# Patient Record
Sex: Female | Born: 1955 | Race: White | Hispanic: No | Marital: Married | Smoking: Former smoker
Health system: Southern US, Community
[De-identification: ages and names within clinical notes are randomized; demographics above are authoritative.]

## PROBLEM LIST (undated history)

## (undated) DIAGNOSIS — T7840XA Allergy, unspecified, initial encounter: Secondary | ICD-10-CM

## (undated) DIAGNOSIS — G4730 Sleep apnea, unspecified: Secondary | ICD-10-CM

## (undated) DIAGNOSIS — E785 Hyperlipidemia, unspecified: Secondary | ICD-10-CM

## (undated) DIAGNOSIS — G709 Myoneural disorder, unspecified: Secondary | ICD-10-CM

## (undated) DIAGNOSIS — R000 Tachycardia, unspecified: Secondary | ICD-10-CM

## (undated) DIAGNOSIS — M5134 Other intervertebral disc degeneration, thoracic region: Secondary | ICD-10-CM

## (undated) DIAGNOSIS — E119 Type 2 diabetes mellitus without complications: Secondary | ICD-10-CM

## (undated) DIAGNOSIS — F419 Anxiety disorder, unspecified: Secondary | ICD-10-CM

## (undated) DIAGNOSIS — F32A Depression, unspecified: Secondary | ICD-10-CM

## (undated) DIAGNOSIS — Z973 Presence of spectacles and contact lenses: Secondary | ICD-10-CM

## (undated) DIAGNOSIS — I10 Essential (primary) hypertension: Secondary | ICD-10-CM

## (undated) DIAGNOSIS — K219 Gastro-esophageal reflux disease without esophagitis: Secondary | ICD-10-CM

## (undated) DIAGNOSIS — J189 Pneumonia, unspecified organism: Secondary | ICD-10-CM

## (undated) DIAGNOSIS — J449 Chronic obstructive pulmonary disease, unspecified: Secondary | ICD-10-CM

## (undated) HISTORY — PX: ANKLE ARTHROSCOPY: SHX545

## (undated) HISTORY — PX: APPENDECTOMY: SHX54

## (undated) HISTORY — PX: CHOLECYSTECTOMY: SHX55

## (undated) HISTORY — DX: Depression, unspecified: F32.A

## (undated) HISTORY — DX: Allergy, unspecified, initial encounter: T78.40XA

## (undated) HISTORY — PX: BLADDER SUSPENSION: SHX72

## (undated) HISTORY — PX: ABDOMINAL HYSTERECTOMY: SHX81

## (undated) HISTORY — DX: Anxiety disorder, unspecified: F41.9

## (undated) HISTORY — PX: KNEE ARTHROSCOPY: SHX127

## (undated) HISTORY — PX: TONSILLECTOMY: SUR1361

## (undated) HISTORY — DX: Sleep apnea, unspecified: G47.30

## (undated) HISTORY — PX: BREAST SURGERY: SHX581

---

## 1986-05-11 HISTORY — PX: REDUCTION MAMMAPLASTY: SUR839

## 1987-05-12 HISTORY — PX: BREAST EXCISIONAL BIOPSY: SUR124

## 1988-05-11 HISTORY — PX: BREAST EXCISIONAL BIOPSY: SUR124

## 2002-01-28 ENCOUNTER — Emergency Department (HOSPITAL_COMMUNITY): Admission: AC | Admit: 2002-01-28 | Discharge: 2002-01-28 | Payer: Self-pay

## 2004-03-11 ENCOUNTER — Ambulatory Visit: Payer: Self-pay | Admitting: Unknown Physician Specialty

## 2004-03-12 ENCOUNTER — Emergency Department: Payer: Self-pay | Admitting: Emergency Medicine

## 2005-01-09 ENCOUNTER — Ambulatory Visit: Payer: Self-pay | Admitting: Unknown Physician Specialty

## 2005-04-27 ENCOUNTER — Ambulatory Visit: Payer: Self-pay | Admitting: Endocrinology

## 2005-07-06 ENCOUNTER — Ambulatory Visit: Payer: Self-pay | Admitting: Podiatry

## 2005-07-22 ENCOUNTER — Other Ambulatory Visit: Payer: Self-pay

## 2005-07-24 ENCOUNTER — Ambulatory Visit: Payer: Self-pay | Admitting: Podiatry

## 2005-12-02 ENCOUNTER — Emergency Department: Payer: Self-pay | Admitting: Emergency Medicine

## 2006-04-02 ENCOUNTER — Emergency Department: Payer: Self-pay | Admitting: Emergency Medicine

## 2007-04-15 ENCOUNTER — Ambulatory Visit: Payer: Self-pay | Admitting: Internal Medicine

## 2007-05-27 ENCOUNTER — Ambulatory Visit (HOSPITAL_BASED_OUTPATIENT_CLINIC_OR_DEPARTMENT_OTHER): Admission: RE | Admit: 2007-05-27 | Discharge: 2007-05-27 | Payer: Self-pay | Admitting: Urology

## 2007-10-27 ENCOUNTER — Ambulatory Visit: Payer: Self-pay | Admitting: Internal Medicine

## 2007-10-28 ENCOUNTER — Ambulatory Visit: Payer: Self-pay | Admitting: Internal Medicine

## 2008-05-23 ENCOUNTER — Ambulatory Visit: Payer: Self-pay | Admitting: Internal Medicine

## 2008-07-28 ENCOUNTER — Emergency Department: Payer: Self-pay | Admitting: Internal Medicine

## 2008-08-15 ENCOUNTER — Ambulatory Visit: Payer: Self-pay | Admitting: Unknown Physician Specialty

## 2008-09-20 ENCOUNTER — Ambulatory Visit: Payer: Self-pay | Admitting: Unknown Physician Specialty

## 2008-12-21 ENCOUNTER — Ambulatory Visit: Payer: Self-pay | Admitting: Internal Medicine

## 2009-03-07 ENCOUNTER — Ambulatory Visit: Payer: Self-pay | Admitting: Internal Medicine

## 2009-04-25 ENCOUNTER — Ambulatory Visit: Payer: Self-pay | Admitting: Internal Medicine

## 2009-07-03 ENCOUNTER — Emergency Department: Payer: Self-pay | Admitting: Emergency Medicine

## 2010-06-05 ENCOUNTER — Ambulatory Visit: Payer: Self-pay

## 2010-06-17 ENCOUNTER — Ambulatory Visit: Payer: Self-pay

## 2010-07-10 ENCOUNTER — Ambulatory Visit: Payer: Self-pay

## 2010-08-06 ENCOUNTER — Ambulatory Visit: Payer: Self-pay | Admitting: Internal Medicine

## 2010-09-15 ENCOUNTER — Emergency Department: Payer: Self-pay | Admitting: Emergency Medicine

## 2010-09-23 NOTE — Op Note (Signed)
NAME:  Wendy Hubbard, Wendy Hubbard                  ACCOUNT NO.:  [id number]   MEDICAL RECORD NO.:  [id number]          PATIENT TYPE:  AMB   LOCATION:  [facility]                         FACILITY:  [facility]   PHYSICIAN:  Mark C. Vernie Ammons, M.D.  DATE OF BIRTH:  1956/03/20   DATE OF PROCEDURE:  05/27/2007  DATE OF DISCHARGE:                               OPERATIVE REPORT   PREOPERATIVE DIAGNOSES:  1. Stress urinary incontinence.  2. Cystocele.   POSTOPERATIVE DIAGNOSES:  1. Stress urinary incontinence.  2. Cystocele.   PROCEDURE:  Anterior repair.   SURGEON:  Mark C. Vernie Ammons, MD   ANESTHESIA:  General.   BLOOD LOSS:  Approximately 50 mL.   DRAINS:  A 16-French Foley catheter.   SPECIMENS:  None.   COMPLICATIONS:  None.   INDICATIONS:  The patient is a 55 year old white female with pure stress  urinary incontinence which has been demonstrated by urodynamic study.  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  She  desires repair of the cystocele simultaneously with correction of her  stress incontinence.  The risks, complications, alternatives and  limitations have been discussed with the patient and she understands and  has elected to proceed.   DESCRIPTION OF OPERATION:  After informed consent, the patient brought  to the major OR, placed on the table, administered general anesthesia  then moved to the dorsal lithotomy position.  Her genitalia was  sterilely prepped with Betadine and draped and an official time-out was  then performed.  A 16-French Foley catheter was placed in the bladder  and a weighted speculum in the vagina.  Then 0.5% Marcaine with  epinephrine was then used to infiltrate the subvaginal mucosa in the  midline and after allowing adequate time for epinephrine effect a  midline incision was then made over the bulging cystocele and on out to  near the opening of the introitus.  Allis clamps were then placed on the  vaginal mucosa and a combination of sharp  and blunt dissection was used  to dissect the vaginal mucosa off of the bulging bladder on both right  and left sides to expose the pubocervical fascia which was easily  identified.   The defect was then repaired by reapproximating the pubocervical fascia  in the midline with interrupted figure-of-eight 0-0 braided permanent  suture.  This obliterated the cystocele defect nicely.  Redundant  vaginal mucosa was then excised.   Dr. Vonita Moss then proceeded to perform a transobturator sling procedure  which will be dictated by him separately and the closure was then  performed by Dr. Vonita Moss which will also be dictated separately.  The  patient tolerated the procedure well.  There were no intraoperative  complications.  She will be observed overnight and discharged after she  passes her voiding trial.      Loraine Leriche C. Vernie Ammons, M.D.  Electronically Signed     MCO/MEDQ  D:  05/27/2007  T:  05/27/2007  Job:  [id number]

## 2010-09-23 NOTE — Op Note (Signed)
NAME:  Hubbard, Wendy                  ACCOUNT NO.:  [id number]   MEDICAL RECORD NO.:  [id number]          PATIENT TYPE:  AMB   LOCATION:  [facility]                         FACILITY:  [facility]   PHYSICIAN:  Maretta Bees. Vonita Moss, M.D.DATE OF BIRTH:  [birth year]   DATE OF PROCEDURE:  05/27/2007  DATE OF DISCHARGE:                               OPERATIVE REPORT   PREOPERATIVE DIAGNOSIS:  Stress urinary incontinence and cystocele.   POSTOP DIAGNOSIS:  Stress urinary incontinence and cystocele.   PROCEDURE:  Obturator sling insertion and cystoscopy.   SURGEON:  Dr. Larey Dresser.   ASSISTANT:  Dr. Ihor Gully.   ANESTHESIA:  General.   INDICATIONS:  This 55 year old lady has had a problem with a cystocele  and stress incontinence.  Urodynamics showed essentially stable bladder.  She was counseled about the need for anterior repair per Dr. Vernie Ammons and  obturator sling insertion by me.  She was advised about the risks of  retention, persistent incontinence, erosion, infection or bladder  injury.   PROCEDURE:  The case was begun by Dr. Vernie Ammons who in a separate  dictation documented the completion of an anterior repair.  I then  dissected out the suburethral vaginal region and had good exposure to  the endopelvic fascia.  Stab wounds were placed over each obturator  fossa.  The curved needle passers were used to march behind the back of  the medial aspect of the obturator bone and exit in through the  endopelvic fascia, mid urethral area.  The sling was then inserted by  pulling up on both sides with our previously placed needle passers.  I  then cystoscoped and there was no evidence of bladder or urethral  injury.  The sling coverings were removed as the sling was placed mid  urethra with no tension created by placing a hemostat between the  urethra with the Foley catheter back in and the sling itself.  Sling and  the wounds were irrigated with double antibiotic solution.   At this point the  vaginal incision was closed by me with running 2-0  Vicryl.  There was good hemostasis.  Urine was clear.  Sponge, needle,  and instrument count was correct.  Estimated blood loss was 50 mL.  A  vaginal pack impregnated with estrogen cream was then placed.  The stab  wounds were closed with Dermabond.  She was taken to the recovery room  in good condition. She was taken to the recovery room in good condition  having tolerated the procedure well.      Maretta Bees. Vonita Moss, M.D.  Electronically Signed    LJP/MEDQ  D:  05/27/2007  T:  05/27/2007  Job:  [id number]

## 2010-09-25 ENCOUNTER — Ambulatory Visit: Payer: Self-pay

## 2010-10-03 ENCOUNTER — Ambulatory Visit: Payer: Self-pay

## 2010-10-13 ENCOUNTER — Ambulatory Visit: Payer: Self-pay | Admitting: Podiatry

## 2010-10-23 ENCOUNTER — Ambulatory Visit: Payer: Self-pay

## 2010-11-10 ENCOUNTER — Ambulatory Visit: Payer: Self-pay | Admitting: Internal Medicine

## 2011-01-29 LAB — I-STAT 8, (EC8 V) (CONVERTED LAB)
BUN: 17
Bicarbonate: 24.9 — ABNORMAL HIGH
Chloride: 109
Glucose, Bld: 126 — ABNORMAL HIGH
HCT: 47 — ABNORMAL HIGH
Hemoglobin: 16 — ABNORMAL HIGH
Operator id: 134391
Potassium: 3.8
Sodium: 140
TCO2: 26
pCO2, Ven: 41.1 — ABNORMAL LOW
pH, Ven: 7.391 — ABNORMAL HIGH

## 2011-01-30 ENCOUNTER — Ambulatory Visit: Payer: Self-pay

## 2011-04-08 ENCOUNTER — Ambulatory Visit: Payer: Self-pay

## 2011-06-11 ENCOUNTER — Ambulatory Visit: Payer: Self-pay | Admitting: Internal Medicine

## 2011-06-29 ENCOUNTER — Other Ambulatory Visit: Payer: Self-pay | Admitting: Unknown Physician Specialty

## 2011-06-29 LAB — CLOSTRIDIUM DIFFICILE BY PCR

## 2011-07-01 LAB — STOOL CULTURE

## 2011-07-05 ENCOUNTER — Emergency Department: Payer: Self-pay | Admitting: Internal Medicine

## 2011-07-05 LAB — CBC
HCT: 39.3 % (ref 35.0–47.0)
HGB: 13.3 g/dL (ref 12.0–16.0)
MCH: 32.7 pg (ref 26.0–34.0)
MCHC: 33.9 g/dL (ref 32.0–36.0)
MCV: 96 fL (ref 80–100)
Platelet: 349 10*3/uL (ref 150–440)
RBC: 4.08 10*6/uL (ref 3.80–5.20)
RDW: 13.6 % (ref 11.5–14.5)
WBC: 17.5 10*3/uL — ABNORMAL HIGH (ref 3.6–11.0)

## 2011-07-16 ENCOUNTER — Ambulatory Visit: Payer: Self-pay | Admitting: Internal Medicine

## 2011-08-14 ENCOUNTER — Ambulatory Visit: Payer: Self-pay

## 2011-11-05 ENCOUNTER — Ambulatory Visit: Payer: Self-pay | Admitting: Internal Medicine

## 2011-12-07 ENCOUNTER — Other Ambulatory Visit: Payer: Self-pay | Admitting: Unknown Physician Specialty

## 2011-12-07 LAB — CLOSTRIDIUM DIFFICILE BY PCR

## 2011-12-25 ENCOUNTER — Ambulatory Visit: Payer: Self-pay | Admitting: Unknown Physician Specialty

## 2011-12-29 ENCOUNTER — Other Ambulatory Visit: Payer: Self-pay | Admitting: Unknown Physician Specialty

## 2011-12-29 LAB — CLOSTRIDIUM DIFFICILE BY PCR

## 2011-12-30 LAB — WBCS, STOOL

## 2012-07-03 ENCOUNTER — Other Ambulatory Visit: Payer: Self-pay | Admitting: Unknown Physician Specialty

## 2012-07-04 ENCOUNTER — Ambulatory Visit: Payer: Self-pay | Admitting: Unknown Physician Specialty

## 2012-07-04 LAB — CLOSTRIDIUM DIFFICILE BY PCR

## 2012-07-06 LAB — STOOL CULTURE

## 2012-07-10 ENCOUNTER — Emergency Department: Payer: Self-pay | Admitting: Emergency Medicine

## 2012-11-02 ENCOUNTER — Ambulatory Visit: Payer: Self-pay | Admitting: Internal Medicine

## 2013-07-24 ENCOUNTER — Ambulatory Visit: Payer: Self-pay

## 2014-08-22 ENCOUNTER — Encounter (HOSPITAL_COMMUNITY): Payer: Self-pay | Admitting: Emergency Medicine

## 2014-08-22 ENCOUNTER — Emergency Department (HOSPITAL_COMMUNITY)
Admission: EM | Admit: 2014-08-22 | Discharge: 2014-08-22 | Disposition: A | Discharge status: Home / Self Care | Payer: Worker's Compensation | Attending: Emergency Medicine | Admitting: Emergency Medicine

## 2014-08-22 ENCOUNTER — Emergency Department (HOSPITAL_COMMUNITY): Payer: Worker's Compensation

## 2014-08-22 DIAGNOSIS — S8992XA Unspecified injury of left lower leg, initial encounter: Secondary | ICD-10-CM | POA: Insufficient documentation

## 2014-08-22 DIAGNOSIS — Z880 Allergy status to penicillin: Secondary | ICD-10-CM | POA: Diagnosis not present

## 2014-08-22 DIAGNOSIS — K219 Gastro-esophageal reflux disease without esophagitis: Secondary | ICD-10-CM | POA: Diagnosis not present

## 2014-08-22 DIAGNOSIS — E119 Type 2 diabetes mellitus without complications: Secondary | ICD-10-CM | POA: Diagnosis not present

## 2014-08-22 DIAGNOSIS — W19XXXA Unspecified fall, initial encounter: Secondary | ICD-10-CM

## 2014-08-22 DIAGNOSIS — W010XXA Fall on same level from slipping, tripping and stumbling without subsequent striking against object, initial encounter: Secondary | ICD-10-CM | POA: Insufficient documentation

## 2014-08-22 DIAGNOSIS — J449 Chronic obstructive pulmonary disease, unspecified: Secondary | ICD-10-CM | POA: Diagnosis not present

## 2014-08-22 DIAGNOSIS — I10 Essential (primary) hypertension: Secondary | ICD-10-CM | POA: Insufficient documentation

## 2014-08-22 DIAGNOSIS — Z79899 Other long term (current) drug therapy: Secondary | ICD-10-CM | POA: Diagnosis not present

## 2014-08-22 DIAGNOSIS — Y998 Other external cause status: Secondary | ICD-10-CM | POA: Diagnosis not present

## 2014-08-22 DIAGNOSIS — Y9289 Other specified places as the place of occurrence of the external cause: Secondary | ICD-10-CM | POA: Insufficient documentation

## 2014-08-22 DIAGNOSIS — Y9389 Activity, other specified: Secondary | ICD-10-CM | POA: Insufficient documentation

## 2014-08-22 DIAGNOSIS — S79912A Unspecified injury of left hip, initial encounter: Secondary | ICD-10-CM | POA: Insufficient documentation

## 2014-08-22 DIAGNOSIS — M25552 Pain in left hip: Secondary | ICD-10-CM

## 2014-08-22 HISTORY — DX: Chronic obstructive pulmonary disease, unspecified: J44.9

## 2014-08-22 HISTORY — DX: Essential (primary) hypertension: I10

## 2014-08-22 HISTORY — DX: Gastro-esophageal reflux disease without esophagitis: K21.9

## 2014-08-22 HISTORY — DX: Hyperlipidemia, unspecified: E78.5

## 2014-08-22 HISTORY — DX: Tachycardia, unspecified: R00.0

## 2014-08-22 HISTORY — DX: Type 2 diabetes mellitus without complications: E11.9

## 2014-08-22 MED ORDER — ONDANSETRON 4 MG PO TBDP
4.0000 mg | ORAL_TABLET | Freq: Once | ORAL | Status: AC
  Administered 2014-08-22: 4 mg via ORAL
  Filled 2014-08-22: qty 1

## 2014-08-22 MED ORDER — CYCLOBENZAPRINE HCL 10 MG PO TABS: 10.0000 mg | ORAL_TABLET | Freq: Two times a day (BID) | ORAL | Status: DC | PRN

## 2014-08-22 MED ORDER — NAPROXEN 375 MG PO TABS: 375.0000 mg | ORAL_TABLET | Freq: Two times a day (BID) | ORAL | Status: DC

## 2014-08-22 MED ORDER — OXYCODONE-ACETAMINOPHEN 5-325 MG PO TABS: 1.0000 | ORAL_TABLET | Freq: Four times a day (QID) | ORAL | Status: DC | PRN

## 2014-08-22 MED ORDER — OXYCODONE-ACETAMINOPHEN 5-325 MG PO TABS
1.0000 | ORAL_TABLET | Freq: Once | ORAL | Status: AC
  Administered 2014-08-22: 1 via ORAL
  Filled 2014-08-22: qty 1

## 2014-08-22 NOTE — Discharge Instructions (Signed)
Arthralgia °Your caregiver has diagnosed you as suffering from an arthralgia. Arthralgia means there is pain in a joint. This can come from many reasons including: °· Bruising the joint which causes soreness (inflammation) in the joint. °· Wear and tear on the joints which occur as we grow older (osteoarthritis). °· Overusing the joint. °· Various forms of arthritis. °· Infections of the joint. °Regardless of the cause of pain in your joint, most of these different pains respond to anti-inflammatory drugs and rest. The exception to this is when a joint is infected, and these cases are treated with antibiotics, if it is a bacterial infection. °HOME CARE INSTRUCTIONS  °· Rest the injured area for as [place] as directed by your caregiver. Then slowly start using the joint as directed by your caregiver and as the pain allows. Crutches as directed may be useful if the ankles, knees or hips are involved. If the knee was splinted or casted, continue use and care as directed. If an stretchy or elastic wrapping bandage has been applied today, it should be removed and re-applied every 3 to 4 hours. It should not be applied tightly, but firmly enough to keep swelling down. Watch toes and feet for swelling, bluish discoloration, coldness, numbness or excessive pain. If any of these problems (symptoms) occur, remove the ace bandage and re-apply more loosely. If these symptoms persist, contact your caregiver or return to this location. °· For the first 24 hours, keep the injured extremity elevated on pillows while lying down. °· Apply ice for 15-20 minutes to the sore joint every couple hours while awake for the first half day. Then 03-04 times per day for the first 48 hours. Put the ice in a plastic bag and place a towel between the bag of ice and your skin. °· Wear any splinting, casting, elastic bandage applications, or slings as instructed. °· Only take over-the-counter or prescription medicines for pain, discomfort, or fever as  directed by your caregiver. Do not use aspirin immediately after the injury unless instructed by your physician. Aspirin can cause increased bleeding and bruising of the tissues. °· If you were given crutches, continue to use them as instructed and do not resume weight bearing on the sore joint until instructed. °Persistent pain and inability to use the sore joint as directed for more than 2 to 3 days are warning signs indicating that you should see a caregiver for a follow-up visit as soon as possible. Initially, a hairline fracture (break in bone) may not be evident on X-rays. Persistent pain and swelling indicate that further evaluation, non-weight bearing or use of the joint (use of crutches or slings as instructed), or further X-rays are indicated. X-rays may sometimes not show a small fracture until a week or 10 days later. Make a follow-up appointment with your own caregiver or one to whom we have referred you. A radiologist (specialist in reading X-rays) may read your X-rays. Make sure you know how you are to obtain your X-ray results. Do not assume everything is normal if you do not hear from us. °SEEK MEDICAL CARE IF: °Bruising, swelling, or pain increases. °SEEK IMMEDIATE MEDICAL CARE IF:  °· Your fingers or toes are numb or blue. °· The pain is not responding to medications and continues to stay the same or get worse. °· The pain in your joint becomes severe. °· You develop a fever over 102° F (38.9° C). °· It becomes impossible to move or use the joint. °MAKE SURE YOU:  °·   Understand these instructions.  Will watch your condition.  Will get help right away if you are not doing well or get worse. Document Released: 04/27/2005 Document Revised: 07/20/2011 Document Reviewed: 12/14/2007 [facility] Patient Information 2015 [place], [place]. This information is not intended to replace advice given to you by your health care provider. Make sure you discuss any questions you have with your health care  provider.  Hip Pain Your hip is the joint between your upper legs and your lower pelvis. The bones, cartilage, tendons, and muscles of your hip joint perform a lot of work each day supporting your body weight and allowing you to move around. Hip pain can range from a minor ache to severe pain in one or both of your hips. Pain may be felt on the inside of the hip joint near the groin, or the outside near the buttocks and upper thigh. You may have swelling or stiffness as well.  HOME CARE INSTRUCTIONS   Take medicines only as directed by your health care provider.  Apply ice to the injured area:  Put ice in a plastic bag.  Place a towel between your skin and the bag.  Leave the ice on for 15-20 minutes at a time, 3-4 times a day.  Keep your leg raised (elevated) when possible to lessen swelling.  Avoid activities that cause pain.  Follow specific exercises as directed by your health care provider.  Sleep with a pillow between your legs on your most comfortable side.  Record how often you have hip pain, the location of the pain, and what it feels like. SEEK MEDICAL CARE IF:   You are unable to put weight on your leg.  Your hip is red or swollen or very tender to touch.  Your pain or swelling continues or worsens after 1 week.  You have increasing difficulty walking.  You have a fever. SEEK IMMEDIATE MEDICAL CARE IF:   You have fallen.  You have a sudden increase in pain and swelling in your hip. MAKE SURE YOU:   Understand these instructions.  Will watch your condition.  Will get help right away if you are not doing well or get worse. Document Released: 10/15/2009 Document Revised: 09/11/2013 Document Reviewed: 12/22/2012 [facility] Patient Information 2015 [place], [place]. This information is not intended to replace advice given to you by your health care provider. Make sure you discuss any questions you have with your health care provider.  Knee Effusion The medical  term for having fluid in your knee is effusion. This is often due to an internal derangement of the knee. This means something is wrong inside the knee. Some of the causes of fluid in the knee may be torn cartilage, a torn ligament, or bleeding into the joint from an injury. Your knee is likely more difficult to bend and move. This is often because there is increased pain and pressure in the joint. The time it takes for recovery from a knee effusion depends on different factors, including:   Type of injury.  Your age.  Physical and medical conditions.  Rehabilitation Strategies. How [place] you will be away from your normal activities will depend on what kind of knee problem you have and how much damage is present. Your knee has two types of cartilage. Articular cartilage covers the bone ends and lets your knee bend and move smoothly. Two menisci, thick pads of cartilage that form a rim inside the joint, help absorb shock and stabilize your knee. Ligaments bind the  bones together and support your knee joint. Muscles move the joint, help support your knee, and take stress off the joint itself. CAUSES  Often an effusion in the knee is caused by an injury to one of the menisci. This is often a tear in the cartilage. Recovery after a meniscus injury depends on how much meniscus is damaged and whether you have damaged other knee tissue. Small tears may heal on their own with conservative treatment. Conservative means rest, limited weight bearing activity and muscle strengthening exercises. Your recovery may take up to 6 weeks.  TREATMENT  Larger tears may require surgery. Meniscus injuries may be treated during arthroscopy. Arthroscopy is a procedure in which your surgeon uses a small telescope like instrument to look in your knee. Your caregiver can make a more accurate diagnosis (learning what is wrong) by performing an arthroscopic procedure. If your injury is on the inner margin of the meniscus, your  surgeon may trim the meniscus back to a smooth rim. In other cases your surgeon will try to repair a damaged meniscus with stitches (sutures). This may make rehabilitation take longer, but may provide better [place] term result by helping your knee keep its shock absorption capabilities. Ligaments which are completely torn usually require surgery for repair. HOME CARE INSTRUCTIONS  Use crutches as instructed.  If a brace is applied, use as directed.  Once you are home, an ice pack applied to your swollen knee may help with discomfort and help decrease swelling.  Keep your knee raised (elevated) when you are not up and around or on crutches.  Only take over-the-counter or prescription medicines for pain, discomfort, or fever as directed by your caregiver.  Your caregivers will help with instructions for rehabilitation of your knee. This often includes strengthening exercises.  You may resume a normal diet and activities as directed. SEEK MEDICAL CARE IF:   There is increased swelling in your knee.  You notice redness, swelling, or increasing pain in your knee.  An unexplained oral temperature above 102 F (38.9 C) develops. SEEK IMMEDIATE MEDICAL CARE IF:   You develop a rash.  You have difficulty breathing.  You have any allergic reactions from medications you may have been given.  There is severe pain with any motion of the knee. MAKE SURE YOU:   Understand these instructions.  Will watch your condition.  Will get help right away if you are not doing well or get worse. Document Released: 07/18/2003 Document Revised: 07/20/2011 Document Reviewed: 09/21/2007 [facility] Patient Information 2015 [place], [place]. This information is not intended to replace advice given to you by your health care provider. Make sure you discuss any questions you have with your health care provider.

## 2014-08-22 NOTE — ED Notes (Signed)
Bed: WTR9 Expected date:  Expected time:  Means of arrival:  Comments: 59 y/o tripped over phone cord

## 2014-08-22 NOTE — ED Provider Notes (Signed)
CSN: [id number]     Arrival date & time 08/22/14  1219 History  This chart was scribed for a non-physician practitioner, Delos Haring, PA-C working with Jola Schmidt, MD by Martinique Peace, ED Scribe. The patient was seen in WTR9/WTR9. The patient's care was started at 12:36 PM.     Chief Complaint  Patient presents with  . Leg Pain  . Knee Injury      Patient is a 59 y.o. female presenting with leg pain. The history is provided by the patient. No language interpreter was used.  Leg Pain    PCP: No primary care provider on file.  Wendy Hubbard is a 59 y.o.female with a significant PMH of diabetes, COPD, hypertension, hyperlipidemia, GERD, rapid heart rate.  HPI Comments: Wendy Hubbard is a 59 y.o. female who presents to the Emergency Department complaining of fall that occurred earlier today while pt was at work and tripped over a phone cord. Pt landed on a concrete floor on her left side. She now complains of left hip pain, left leg pain, and bilateral knee pain (left worse than right). No complaints of headache, impacts to the head, LOC, syncope, or rib pain. History of DM and hypertension.    Past Medical History  Diagnosis Date  . Diabetes mellitus without complication   . COPD (chronic obstructive pulmonary disease)   . Hypertension   . Hyperlipidemia   . GERD (gastroesophageal reflux disease)   . Rapid heart rate    Past Surgical History  Procedure Laterality Date  . Tonsillectomy    . Abdominal hysterectomy    . Breast surgery    . Cholecystectomy    . Appendectomy    . Ankle arthroscopy Bilateral   . Knee arthroscopy Left   . Bladder suspension     History reviewed. No pertinent family history. History  Substance Use Topics  . Smoking status: Not on file  . Smokeless tobacco: Not on file  . Alcohol Use: Not on file   OB History    No data available     Review of Systems  Musculoskeletal: Positive for arthralgias.  Neurological: Negative for syncope and  headaches.  All other systems reviewed and are negative.   Allergies  Flagyl; Penicillins; and Doxycycline  Home Medications   Prior to Admission medications   Medication Sig Start Date End Date Taking? Authorizing Provider  allopurinol (ZYLOPRIM) 300 MG tablet Take 1 tablet by mouth daily. 07/20/14  Yes Historical Provider, MD  ALPRAZolam Duanne Moron) 0.25 MG tablet Take 0.25 mg by mouth 2 (two) times daily as needed for anxiety.   Yes Historical Provider, MD  furosemide (LASIX) 40 MG tablet Take 1 tablet by mouth daily. 07/26/14  Yes Historical Provider, MD  glimepiride (AMARYL) 1 MG tablet Take 1 tablet by mouth daily. 07/20/14  Yes Historical Provider, MD  HYDROcodone-acetaminophen (NORCO/VICODIN) 5-325 MG per tablet Take 1 tablet by mouth at bedtime as needed. pain 05/30/14  Yes Historical Provider, MD  lansoprazole (PREVACID) 30 MG capsule Take 1 capsule by mouth 2 (two) times daily. 07/20/14  Yes Historical Provider, MD  lisinopril (PRINIVIL,ZESTRIL) 10 MG tablet take 1 tablet by mouth once daily 10/10/13  Yes Historical Provider, MD  LYRICA 75 MG capsule Take 1 capsule by mouth 2 (two) times daily. 07/26/14  Yes Historical Provider, MD  sitaGLIPtin-metformin (JANUMET) 50-500 MG per tablet Take 1 tablet by mouth 2 (two) times daily with a meal.   Yes Historical Provider, MD  spironolactone (ALDACTONE) 50  MG tablet Take 50 mg by mouth daily. 07/20/14  Yes Historical Provider, MD  cyclobenzaprine (FLEXERIL) 10 MG tablet Take 1 tablet (10 mg total) by mouth 2 (two) times daily as needed for muscle spasms. 08/22/14   Wilton Thrall Carlota Raspberry, PA-C  naproxen (NAPROSYN) 375 MG tablet Take 1 tablet (375 mg total) by mouth 2 (two) times daily. 08/22/14   Delos Haring, PA-C  oxyCODONE-acetaminophen (PERCOCET/ROXICET) 5-325 MG per tablet Take 1-2 tablets by mouth every 6 (six) hours as needed for severe pain. 08/22/14   Judeth Gilles Carlota Raspberry, PA-C   BP 110/46 mmHg  Pulse 79  Resp 18  SpO2 96% Physical Exam   Constitutional: She is oriented to person, place, and time. She appears well-developed and well-nourished. No distress.  HENT:  Head: Normocephalic. Head is without Battle's sign, without abrasion, without contusion, without right periorbital erythema and without left periorbital erythema.  Eyes: Conjunctivae and EOM are normal.  Neck: Neck supple. No spinous process tenderness and no muscular tenderness present. No tracheal deviation present.  Cardiovascular: Normal rate.   Pulmonary/Chest: Effort normal. No respiratory distress. She exhibits no tenderness, no bony tenderness and no laceration.  Abdominal: Bowel sounds are normal. She exhibits no distension and no fluid wave. There is no tenderness.  Musculoskeletal:       Left hip: She exhibits tenderness and bony tenderness. She exhibits normal range of motion (pt able to perform FROM with some discomfort), normal strength, no swelling and no deformity.       Left knee: She exhibits swelling. She exhibits normal range of motion and no deformity. Tenderness found. Medial joint line and lateral joint line tenderness noted. No patellar tendon tenderness noted.  Neurological: She is alert and oriented to person, place, and time.  Skin: Skin is warm and dry.  Psychiatric: She has a normal mood and affect. Her behavior is normal.  Nursing note and vitals reviewed.   ED Course  Procedures (including critical care time) Labs Review Labs Reviewed - No data to display  Imaging Review Dg Knee Complete 4 Views Left  08/22/2014   CLINICAL DATA:  [place] over telephone wire. Landed on knee. Knee injury and pain. Initial encounter.  EXAM: LEFT KNEE - COMPLETE 4+ VIEW  COMPARISON:  None.  FINDINGS: There is no evidence of fracture, dislocation, or joint effusion. Mild tricompartmental degenerative spurring is seen. There is mild medial joint space narrowing. No other significant bone abnormality identified. Soft tissues are unremarkable.  IMPRESSION: No  acute findings.  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   Electronically Signed   By: Earle Gell M.D.   On: 08/22/2014 15:19   Dg Hip Unilat With Pelvis 2-3 Views Left  08/22/2014   CLINICAL DATA:  Tripped over telephone wire. Left hip injury and pain. Initial encounter.  EXAM: LEFT HIP (WITH PELVIS) 2-3 VIEWS  COMPARISON:  None.  FINDINGS: There is no evidence of hip fracture or dislocation. There is no evidence of arthropathy or other focal bone abnormality.  IMPRESSION: Negative.   Electronically Signed   By: Earle Gell M.D.   On: 08/22/2014 14:52     EKG Interpretation None     Medications  oxyCODONE-acetaminophen (PERCOCET/ROXICET) 5-325 MG per tablet 1 tablet (1 tablet Oral Given 08/22/14 1256)  ondansetron (ZOFRAN-ODT) disintegrating tablet 4 mg (4 mg Oral Given 08/22/14 1256)  oxyCODONE-acetaminophen (PERCOCET/ROXICET) 5-325 MG per tablet 1 tablet (1 tablet Oral Given 08/22/14 1532)   12:39 PM- Treatment plan was discussed with patient who verbalizes understanding and  agrees.   MDM   Final diagnoses:  Fall  Knee injury, left, initial encounter  Left hip pain    Xrays are unremarkable. Pt will most likely need MRI of her knee to r/o tendon/ligament injury but as an outpatient, will place in knee immobilizer, she declines crutches at home and reports having walker at home. Hip is no longer hurting. RICE recommended. She requests Dr. Ronnie Derby specifically for f/u.  59 y.o.Wendy Hubbard's evaluation in the Emergency Department is complete. It has been determined that no acute conditions requiring further emergency intervention are present at this time. The patient/guardian have been advised of the diagnosis and plan. We have discussed signs and symptoms that warrant return to the ED, such as changes or worsening in symptoms.  Vital signs are stable at discharge. Filed Vitals:   08/22/14 1501  BP: 110/46  Pulse: 79  Resp: 18    Patient/guardian has voiced  understanding and agreed to follow-up with the PCP or specialist.   I personally performed the services described in this documentation, which was scribed in my presence. The recorded information has been reviewed and is accurate.   Delos Haring, PA-C 08/22/14 [place], MD 08/22/14 [phone]

## 2014-08-22 NOTE — ED Notes (Signed)
Pt tripped over a cord, landed on a concrete floor. Fell to knees first, complaint of mid thigh pain to left leg and knee pain bilaterally.

## 2014-08-22 NOTE — ED Notes (Signed)
AVS explained in detail. Knows to follow up with MD Lucey (orthopedics) ASAP. No other c/c. All medications explained in detail. Knows not to drink alcohol/drive/operate heavy machinery with prescribed medications.

## 2014-08-22 NOTE — ED Notes (Signed)
Ortho called 

## 2014-09-06 DIAGNOSIS — G8929 Other chronic pain: Secondary | ICD-10-CM | POA: Insufficient documentation

## 2014-09-06 DIAGNOSIS — M25562 Pain in left knee: Secondary | ICD-10-CM | POA: Insufficient documentation

## 2014-10-04 DIAGNOSIS — M1712 Unilateral primary osteoarthritis, left knee: Secondary | ICD-10-CM | POA: Insufficient documentation

## 2015-07-18 ENCOUNTER — Ambulatory Visit
Admission: RE | Admit: 2015-07-18 | Discharge: 2015-07-18 | Disposition: A | Discharge status: Home / Self Care | Payer: BLUE CROSS/BLUE SHIELD | Source: Ambulatory Visit | Attending: Physician Assistant | Admitting: Physician Assistant

## 2015-07-18 ENCOUNTER — Other Ambulatory Visit: Payer: Self-pay | Admitting: Physician Assistant

## 2015-07-18 DIAGNOSIS — R05 Cough: Secondary | ICD-10-CM

## 2015-07-18 DIAGNOSIS — R059 Cough, unspecified: Secondary | ICD-10-CM

## 2015-08-19 ENCOUNTER — Other Ambulatory Visit: Payer: Self-pay | Admitting: Nurse Practitioner

## 2015-08-19 ENCOUNTER — Ambulatory Visit
Admission: RE | Admit: 2015-08-19 | Discharge: 2015-08-19 | Disposition: A | Discharge status: Home / Self Care | Payer: BLUE CROSS/BLUE SHIELD | Source: Ambulatory Visit | Attending: Nurse Practitioner | Admitting: Nurse Practitioner

## 2015-08-19 DIAGNOSIS — M50323 Other cervical disc degeneration at C6-C7 level: Secondary | ICD-10-CM | POA: Diagnosis not present

## 2015-08-19 DIAGNOSIS — R52 Pain, unspecified: Secondary | ICD-10-CM | POA: Insufficient documentation

## 2015-08-19 DIAGNOSIS — M50322 Other cervical disc degeneration at C5-C6 level: Secondary | ICD-10-CM | POA: Diagnosis not present

## 2015-08-19 DIAGNOSIS — M2560 Stiffness of unspecified joint, not elsewhere classified: Secondary | ICD-10-CM

## 2015-08-19 DIAGNOSIS — M129 Arthropathy, unspecified: Secondary | ICD-10-CM | POA: Insufficient documentation

## 2015-08-19 DIAGNOSIS — R6889 Other general symptoms and signs: Secondary | ICD-10-CM | POA: Insufficient documentation

## 2015-10-21 ENCOUNTER — Other Ambulatory Visit
Admission: RE | Admit: 2015-10-21 | Discharge: 2015-10-21 | Disposition: A | Discharge status: Home / Self Care | Payer: BLUE CROSS/BLUE SHIELD | Source: Ambulatory Visit | Attending: Nurse Practitioner | Admitting: Nurse Practitioner

## 2015-10-21 ENCOUNTER — Other Ambulatory Visit: Payer: Self-pay | Admitting: Nurse Practitioner

## 2015-10-21 DIAGNOSIS — K625 Hemorrhage of anus and rectum: Secondary | ICD-10-CM

## 2015-10-21 DIAGNOSIS — R195 Other fecal abnormalities: Secondary | ICD-10-CM

## 2015-10-21 DIAGNOSIS — K529 Noninfective gastroenteritis and colitis, unspecified: Secondary | ICD-10-CM | POA: Diagnosis present

## 2015-10-21 DIAGNOSIS — R1032 Left lower quadrant pain: Secondary | ICD-10-CM

## 2015-10-21 LAB — GASTROINTESTINAL PANEL BY PCR, STOOL (REPLACES STOOL CULTURE)

## 2015-10-21 LAB — C DIFFICILE QUICK SCREEN W PCR REFLEX
C Diff antigen: NEGATIVE
C Diff interpretation: NEGATIVE
C Diff toxin: NEGATIVE

## 2015-10-22 DIAGNOSIS — E1142 Type 2 diabetes mellitus with diabetic polyneuropathy: Secondary | ICD-10-CM | POA: Insufficient documentation

## 2015-10-22 DIAGNOSIS — G4733 Obstructive sleep apnea (adult) (pediatric): Secondary | ICD-10-CM | POA: Insufficient documentation

## 2015-10-22 DIAGNOSIS — E119 Type 2 diabetes mellitus without complications: Secondary | ICD-10-CM | POA: Insufficient documentation

## 2015-10-22 DIAGNOSIS — K219 Gastro-esophageal reflux disease without esophagitis: Secondary | ICD-10-CM | POA: Insufficient documentation

## 2015-10-23 ENCOUNTER — Ambulatory Visit
Admission: RE | Admit: 2015-10-23 | Discharge: 2015-10-23 | Disposition: A | Discharge status: Home / Self Care | Payer: BLUE CROSS/BLUE SHIELD | Source: Ambulatory Visit | Attending: Nurse Practitioner | Admitting: Nurse Practitioner

## 2015-10-23 DIAGNOSIS — R195 Other fecal abnormalities: Secondary | ICD-10-CM | POA: Diagnosis not present

## 2015-10-23 DIAGNOSIS — R1032 Left lower quadrant pain: Secondary | ICD-10-CM | POA: Diagnosis not present

## 2015-10-23 DIAGNOSIS — K625 Hemorrhage of anus and rectum: Secondary | ICD-10-CM | POA: Diagnosis not present

## 2015-10-23 DIAGNOSIS — K529 Noninfective gastroenteritis and colitis, unspecified: Secondary | ICD-10-CM | POA: Insufficient documentation

## 2015-10-23 DIAGNOSIS — K760 Fatty (change of) liver, not elsewhere classified: Secondary | ICD-10-CM | POA: Insufficient documentation

## 2015-10-23 MED ORDER — IOPAMIDOL (ISOVUE-300) INJECTION 61%
100.0000 mL | Freq: Once | INTRAVENOUS | Status: AC | PRN
  Administered 2015-10-23: 100 mL via INTRAVENOUS

## 2015-11-01 ENCOUNTER — Emergency Department: Payer: BLUE CROSS/BLUE SHIELD

## 2015-11-01 ENCOUNTER — Observation Stay
Admission: EM | Admit: 2015-11-01 | Discharge: 2015-11-04 | Disposition: A | Discharge status: Home / Self Care | Payer: BLUE CROSS/BLUE SHIELD | Attending: Internal Medicine | Admitting: Internal Medicine

## 2015-11-01 ENCOUNTER — Encounter: Payer: Self-pay | Admitting: *Deleted

## 2015-11-01 ENCOUNTER — Observation Stay: Payer: BLUE CROSS/BLUE SHIELD

## 2015-11-01 DIAGNOSIS — E669 Obesity, unspecified: Secondary | ICD-10-CM | POA: Insufficient documentation

## 2015-11-01 DIAGNOSIS — Z883 Allergy status to other anti-infective agents status: Secondary | ICD-10-CM | POA: Diagnosis not present

## 2015-11-01 DIAGNOSIS — K640 First degree hemorrhoids: Secondary | ICD-10-CM | POA: Insufficient documentation

## 2015-11-01 DIAGNOSIS — J449 Chronic obstructive pulmonary disease, unspecified: Secondary | ICD-10-CM | POA: Insufficient documentation

## 2015-11-01 DIAGNOSIS — I700 Atherosclerosis of aorta: Secondary | ICD-10-CM | POA: Insufficient documentation

## 2015-11-01 DIAGNOSIS — Z9049 Acquired absence of other specified parts of digestive tract: Secondary | ICD-10-CM | POA: Insufficient documentation

## 2015-11-01 DIAGNOSIS — R1011 Right upper quadrant pain: Secondary | ICD-10-CM | POA: Diagnosis present

## 2015-11-01 DIAGNOSIS — R1032 Left lower quadrant pain: Secondary | ICD-10-CM

## 2015-11-01 DIAGNOSIS — M109 Gout, unspecified: Secondary | ICD-10-CM | POA: Diagnosis not present

## 2015-11-01 DIAGNOSIS — I110 Hypertensive heart disease with heart failure: Secondary | ICD-10-CM | POA: Insufficient documentation

## 2015-11-01 DIAGNOSIS — K760 Fatty (change of) liver, not elsewhere classified: Secondary | ICD-10-CM | POA: Insufficient documentation

## 2015-11-01 DIAGNOSIS — Z7984 Long term (current) use of oral hypoglycemic drugs: Secondary | ICD-10-CM | POA: Insufficient documentation

## 2015-11-01 DIAGNOSIS — Z87891 Personal history of nicotine dependence: Secondary | ICD-10-CM | POA: Insufficient documentation

## 2015-11-01 DIAGNOSIS — Z90710 Acquired absence of both cervix and uterus: Secondary | ICD-10-CM | POA: Insufficient documentation

## 2015-11-01 DIAGNOSIS — G8929 Other chronic pain: Secondary | ICD-10-CM | POA: Diagnosis not present

## 2015-11-01 DIAGNOSIS — D72829 Elevated white blood cell count, unspecified: Secondary | ICD-10-CM | POA: Insufficient documentation

## 2015-11-01 DIAGNOSIS — I509 Heart failure, unspecified: Secondary | ICD-10-CM | POA: Diagnosis not present

## 2015-11-01 DIAGNOSIS — Z880 Allergy status to penicillin: Secondary | ICD-10-CM | POA: Diagnosis not present

## 2015-11-01 DIAGNOSIS — F419 Anxiety disorder, unspecified: Secondary | ICD-10-CM | POA: Insufficient documentation

## 2015-11-01 DIAGNOSIS — K319 Disease of stomach and duodenum, unspecified: Secondary | ICD-10-CM | POA: Insufficient documentation

## 2015-11-01 DIAGNOSIS — K2970 Gastritis, unspecified, without bleeding: Secondary | ICD-10-CM | POA: Diagnosis not present

## 2015-11-01 DIAGNOSIS — E1140 Type 2 diabetes mellitus with diabetic neuropathy, unspecified: Secondary | ICD-10-CM | POA: Insufficient documentation

## 2015-11-01 DIAGNOSIS — K625 Hemorrhage of anus and rectum: Secondary | ICD-10-CM | POA: Insufficient documentation

## 2015-11-01 DIAGNOSIS — K219 Gastro-esophageal reflux disease without esophagitis: Secondary | ICD-10-CM | POA: Insufficient documentation

## 2015-11-01 DIAGNOSIS — Z87892 Personal history of anaphylaxis: Secondary | ICD-10-CM | POA: Insufficient documentation

## 2015-11-01 DIAGNOSIS — E785 Hyperlipidemia, unspecified: Secondary | ICD-10-CM | POA: Diagnosis not present

## 2015-11-01 DIAGNOSIS — Z9889 Other specified postprocedural states: Secondary | ICD-10-CM | POA: Diagnosis not present

## 2015-11-01 DIAGNOSIS — Z6841 Body Mass Index (BMI) 40.0 and over, adult: Secondary | ICD-10-CM | POA: Insufficient documentation

## 2015-11-01 DIAGNOSIS — N281 Cyst of kidney, acquired: Secondary | ICD-10-CM | POA: Diagnosis not present

## 2015-11-01 DIAGNOSIS — R109 Unspecified abdominal pain: Secondary | ICD-10-CM | POA: Diagnosis present

## 2015-11-01 LAB — URINALYSIS COMPLETE WITH MICROSCOPIC (ARMC ONLY)
Bilirubin Urine: NEGATIVE
Glucose, UA: NEGATIVE mg/dL
Ketones, ur: NEGATIVE mg/dL
Leukocytes, UA: NEGATIVE
Nitrite: NEGATIVE
Protein, ur: NEGATIVE mg/dL
Specific Gravity, Urine: 1.006 (ref 1.005–1.030)
Squamous Epithelial / LPF: NONE SEEN
pH: 5 (ref 5.0–8.0)

## 2015-11-01 LAB — CBC
HCT: 37.2 % (ref 35.0–47.0)
HCT: 35 % (ref 35.0–47.0)
Hemoglobin: 12.3 g/dL (ref 12.0–16.0)
Hemoglobin: 11.7 g/dL — ABNORMAL LOW (ref 12.0–16.0)
MCH: 32.4 pg (ref 26.0–34.0)
MCH: 32.5 pg (ref 26.0–34.0)
MCHC: 33 g/dL (ref 32.0–36.0)
MCHC: 33.3 g/dL (ref 32.0–36.0)
MCV: 98.3 fL (ref 80.0–100.0)
MCV: 97.5 fL (ref 80.0–100.0)
Platelets: 336 10*3/uL (ref 150–440)
Platelets: 318 10*3/uL (ref 150–440)
RBC: 3.78 MIL/uL — ABNORMAL LOW (ref 3.80–5.20)
RBC: 3.59 MIL/uL — ABNORMAL LOW (ref 3.80–5.20)
RDW: 14 % (ref 11.5–14.5)
RDW: 14 % (ref 11.5–14.5)
WBC: 18.8 10*3/uL — ABNORMAL HIGH (ref 3.6–11.0)
WBC: 14.4 10*3/uL — ABNORMAL HIGH (ref 3.6–11.0)

## 2015-11-01 LAB — LACTIC ACID, PLASMA
Lactic Acid, Venous: 1.7 mmol/L (ref 0.5–2.0)
Lactic Acid, Venous: 2.7 mmol/L (ref 0.5–2.0)

## 2015-11-01 LAB — GLUCOSE, CAPILLARY
Glucose-Capillary: 98 mg/dL (ref 65–99)
Glucose-Capillary: 106 mg/dL — ABNORMAL HIGH (ref 65–99)

## 2015-11-01 LAB — TROPONIN I: Troponin I: <0.03 ng/mL (ref <0.031)

## 2015-11-01 LAB — COMPREHENSIVE METABOLIC PANEL
ALT: 26 U/L (ref 14–54)
AST: 25 U/L (ref 15–41)
Albumin: 4.1 g/dL (ref 3.5–5.0)
Alkaline Phosphatase: 85 U/L (ref 38–126)
Anion gap: 10 (ref 5–15)
BUN: 22 mg/dL — ABNORMAL HIGH (ref 6–20)
CO2: 23 mmol/L (ref 22–32)
Calcium: 9.3 mg/dL (ref 8.9–10.3)
Chloride: 104 mmol/L (ref 101–111)
Creatinine, Ser: 1.02 mg/dL — ABNORMAL HIGH (ref 0.44–1.00)
GFR calc Af Amer: >60 mL/min (ref >60)
GFR calc non Af Amer: 59 mL/min — ABNORMAL LOW (ref >60)
Glucose, Bld: 108 mg/dL — ABNORMAL HIGH (ref 65–99)
Potassium: 3.4 mmol/L — ABNORMAL LOW (ref 3.5–5.1)
Sodium: 137 mmol/L (ref 135–145)
Total Bilirubin: 0.3 mg/dL (ref 0.3–1.2)
Total Protein: 6.9 g/dL (ref 6.5–8.1)

## 2015-11-01 LAB — LIPASE, BLOOD: Lipase: 44 U/L (ref 11–51)

## 2015-11-01 MED ORDER — ENOXAPARIN SODIUM 40 MG/0.4ML ~~LOC~~ SOLN: 40.0000 mg | SUBCUTANEOUS | Status: DC

## 2015-11-01 MED ORDER — CYCLOBENZAPRINE HCL 10 MG PO TABS
10.0000 mg | ORAL_TABLET | Freq: Two times a day (BID) | ORAL | Status: DC | PRN
  Administered 2015-11-01: 10 mg via ORAL
  Filled 2015-11-01: qty 1

## 2015-11-01 MED ORDER — ACETAMINOPHEN 325 MG PO TABS
650.0000 mg | ORAL_TABLET | Freq: Four times a day (QID) | ORAL | Status: DC | PRN
  Administered 2015-11-03: 650 mg via ORAL
  Filled 2015-11-01: qty 2

## 2015-11-01 MED ORDER — HYDROMORPHONE HCL 1 MG/ML IJ SOLN
1.0000 mg | Freq: Once | INTRAMUSCULAR | Status: AC
  Administered 2015-11-01: 1 mg via INTRAVENOUS
  Filled 2015-11-01: qty 1

## 2015-11-01 MED ORDER — PREGABALIN 75 MG PO CAPS
75.0000 mg | ORAL_CAPSULE | Freq: Two times a day (BID) | ORAL | Status: DC
  Administered 2015-11-01: 75 mg via ORAL
  Filled 2015-11-01: qty 1

## 2015-11-01 MED ORDER — ONDANSETRON HCL 4 MG PO TABS: 4.0000 mg | ORAL_TABLET | Freq: Four times a day (QID) | ORAL | Status: DC | PRN

## 2015-11-01 MED ORDER — POLYETHYLENE GLYCOL 3350 17 G PO PACK: 17.0000 g | PACK | Freq: Every day | ORAL | Status: DC | PRN

## 2015-11-01 MED ORDER — ACETAMINOPHEN 650 MG RE SUPP: 650.0000 mg | Freq: Four times a day (QID) | RECTAL | Status: DC | PRN

## 2015-11-01 MED ORDER — DIATRIZOATE MEGLUMINE & SODIUM 66-10 % PO SOLN
15.0000 mL | Freq: Once | ORAL | Status: AC
  Administered 2015-11-01: 15 mL via ORAL

## 2015-11-01 MED ORDER — FUROSEMIDE 40 MG PO TABS
40.0000 mg | ORAL_TABLET | Freq: Every day | ORAL | Status: DC
  Administered 2015-11-02 – 2015-11-04 (×3): 40 mg via ORAL
  Filled 2015-11-01 (×4): qty 1

## 2015-11-01 MED ORDER — MORPHINE SULFATE (PF) 4 MG/ML IV SOLN
4.0000 mg | Freq: Once | INTRAVENOUS | Status: AC
Start: 2015-11-01 — End: 2015-11-01
  Administered 2015-11-01: 4 mg via INTRAVENOUS
  Filled 2015-11-01: qty 1

## 2015-11-01 MED ORDER — ONDANSETRON HCL 4 MG/2ML IJ SOLN
4.0000 mg | Freq: Four times a day (QID) | INTRAMUSCULAR | Status: DC | PRN
  Administered 2015-11-03: 4 mg via INTRAVENOUS
  Filled 2015-11-01: qty 2

## 2015-11-01 MED ORDER — INSULIN ASPART 100 UNIT/ML ~~LOC~~ SOLN
0.0000 [IU] | Freq: Three times a day (TID) | SUBCUTANEOUS | Status: DC
  Filled 2015-11-01: qty 2

## 2015-11-01 MED ORDER — ALLOPURINOL 300 MG PO TABS
300.0000 mg | ORAL_TABLET | Freq: Every day | ORAL | Status: DC
  Administered 2015-11-01 – 2015-11-03 (×3): 300 mg via ORAL
  Filled 2015-11-01 (×5): qty 1

## 2015-11-01 MED ORDER — SPIRONOLACTONE 25 MG PO TABS
50.0000 mg | ORAL_TABLET | Freq: Every day | ORAL | Status: DC
  Administered 2015-11-02 – 2015-11-04 (×3): 50 mg via ORAL
  Filled 2015-11-01 (×4): qty 2

## 2015-11-01 MED ORDER — LISINOPRIL 10 MG PO TABS
10.0000 mg | ORAL_TABLET | Freq: Every day | ORAL | Status: DC
  Administered 2015-11-01 – 2015-11-03 (×3): 10 mg via ORAL
  Filled 2015-11-01 (×4): qty 1

## 2015-11-01 MED ORDER — PANTOPRAZOLE SODIUM 40 MG PO TBEC
40.0000 mg | DELAYED_RELEASE_TABLET | Freq: Every day | ORAL | Status: DC
  Administered 2015-11-01 – 2015-11-04 (×4): 40 mg via ORAL
  Filled 2015-11-01 (×4): qty 1

## 2015-11-01 MED ORDER — IOPAMIDOL (ISOVUE-300) INJECTION 61%
100.0000 mL | Freq: Once | INTRAVENOUS | Status: AC | PRN
  Administered 2015-11-01: 100 mL via INTRAVENOUS

## 2015-11-01 MED ORDER — ONDANSETRON HCL 4 MG/2ML IJ SOLN
4.0000 mg | Freq: Once | INTRAMUSCULAR | Status: AC
  Administered 2015-11-01: 4 mg via INTRAVENOUS
  Filled 2015-11-01: qty 2

## 2015-11-01 MED ORDER — MORPHINE SULFATE (PF) 2 MG/ML IV SOLN
2.0000 mg | INTRAVENOUS | Status: DC | PRN
Start: 2015-11-01 — End: 2015-11-04
  Administered 2015-11-01 – 2015-11-02 (×6): 2 mg via INTRAVENOUS
  Filled 2015-11-01 (×6): qty 1

## 2015-11-01 MED ORDER — ALPRAZOLAM 0.25 MG PO TABS: 0.2500 mg | ORAL_TABLET | Freq: Two times a day (BID) | ORAL | Status: DC | PRN

## 2015-11-01 MED ORDER — OXYCODONE HCL 5 MG PO TABS
5.0000 mg | ORAL_TABLET | ORAL | Status: DC | PRN
  Administered 2015-11-01 – 2015-11-03 (×7): 5 mg via ORAL
  Filled 2015-11-01 (×7): qty 1

## 2015-11-01 MED ORDER — DOCUSATE SODIUM 100 MG PO CAPS
100.0000 mg | ORAL_CAPSULE | Freq: Two times a day (BID) | ORAL | Status: DC
  Administered 2015-11-01: 100 mg via ORAL
  Filled 2015-11-01 (×2): qty 1

## 2015-11-01 MED ORDER — SODIUM CHLORIDE 0.9 % IV SOLN
INTRAVENOUS | Status: DC
  Administered 2015-11-01 – 2015-11-04 (×4): via INTRAVENOUS

## 2015-11-01 MED ORDER — SODIUM CHLORIDE 0.9 % IV BOLUS (SEPSIS)
1000.0000 mL | Freq: Once | INTRAVENOUS | Status: AC
  Administered 2015-11-01: 1000 mL via INTRAVENOUS

## 2015-11-01 MED ORDER — INSULIN ASPART 100 UNIT/ML ~~LOC~~ SOLN: 0.0000 [IU] | Freq: Every day | SUBCUTANEOUS | Status: DC

## 2015-11-01 NOTE — Consult Note (Signed)
GI Inpatient Consult Note  Reason for Consult:abd pain, diarrhea, elevated WBC    Attending Requesting Consult:Hower History of Present Illness: Wendy Hubbard is a 60 y.o. femalewith RUQ abd pain, stabbing constant pain, no change with eating, nausea but no vomiting, present off and on for 6 months, no change in pain with eating or changing position.  Also pain in LLQ no change with BM, no change with change in position,  She has diarrhea 6-12 stools a day, maybe 3 nights a week has 1-2 nocturnal bowel movements.  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  Past Medical History:  Past Medical History  Diagnosis Date  . Diabetes mellitus without complication ([place])   . COPD (chronic obstructive pulmonary disease) ([place])   . Hypertension   . Hyperlipidemia   . GERD (gastroesophageal reflux disease)   . Rapid heart rate     Problem List: Patient Active Problem List   Diagnosis Date Noted  . Intractable abdominal pain 11/01/2015    Past Surgical History: Past Surgical History  Procedure Laterality Date  . Tonsillectomy    . Abdominal hysterectomy    . Breast surgery    . Cholecystectomy    . Appendectomy    . Ankle arthroscopy Bilateral   . Knee arthroscopy Left   . Bladder suspension      Allergies: Allergies  Allergen Reactions  . Flagyl [Metronidazole] Anaphylaxis and Rash  . Penicillins Anaphylaxis and Other (See Comments)    Has patient had a PCN reaction causing immediate rash, facial/tongue/throat swelling, SOB or lightheadedness with hypotension: Yes Has patient had a PCN reaction causing severe rash involving mucus membranes or skin necrosis: No Has patient had a PCN reaction that required hospitalization No Has patient had a PCN reaction occurring within the last 10 years: No If all of the above answers are "NO", then may proceed with Cephalosporin use.  Marland Kitchen Doxycycline Hives    Home Medications: Prescriptions prior to admission  Medication Sig Dispense  Refill Last Dose  . allopurinol (ZYLOPRIM) 300 MG tablet Take 300 mg by mouth at bedtime.   0 10/31/2015 at Unknown time  . ALPRAZolam (XANAX) 0.25 MG tablet Take 0.25 mg by mouth 2 (two) times daily as needed for anxiety.   Past Week at Unknown time  . carisoprodol (SOMA) 350 MG tablet Take 350 mg by mouth 2 (two) times daily as needed for muscle spasms.   Past Week at Unknown time  . escitalopram (LEXAPRO) 10 MG tablet Take 10 mg by mouth at bedtime.   10/31/2015 at Unknown time  . furosemide (LASIX) 40 MG tablet Take 40 mg by mouth daily.   0 11/01/2015 at Unknown time  . glimepiride (AMARYL) 1 MG tablet Take 1 mg by mouth daily with breakfast.   0 11/01/2015 at Unknown time  . HYDROcodone-acetaminophen (NORCO/VICODIN) 5-325 MG per tablet Take 1 tablet by mouth at bedtime as needed for moderate pain.   0 10/31/2015 at 1930  . lansoprazole (PREVACID) 30 MG capsule Take 30 mg by mouth 2 (two) times daily.   0 11/01/2015 at Unknown time  . lisinopril (PRINIVIL,ZESTRIL) 10 MG tablet Take 10 mg by mouth at bedtime.    10/31/2015 at Unknown time  . metoprolol succinate (TOPROL-XL) 50 MG 24 hr tablet Take 50 mg by mouth at bedtime. Take with or immediately following a meal.   10/31/2015 at 2200  . montelukast (SINGULAIR) 10 MG tablet Take 10 mg by mouth daily.  11/01/2015 at Unknown time  . sitaGLIPtin-metformin (JANUMET) 50-1000 MG tablet Take 1 tablet by mouth 2 (two) times daily with a meal.   11/01/2015 at Unknown time  . spironolactone (ALDACTONE) 50 MG tablet Take 50 mg by mouth daily.  0 11/01/2015 at Unknown time   Home medication reconciliation was completed with the patient.   Scheduled Inpatient Medications:   . allopurinol  300 mg Oral Daily  . docusate sodium  100 mg Oral BID  . furosemide  40 mg Oral Daily  . insulin aspart  0-5 Units Subcutaneous QHS  . insulin aspart  0-9 Units Subcutaneous TID WC  . lisinopril  10 mg Oral Daily  . pantoprazole  40 mg Oral Daily  . pregabalin  75 mg Oral  BID  . spironolactone  50 mg Oral Daily    Continuous Inpatient Infusions:   . sodium chloride 75 mL/hr at 11/01/15 1656    PRN Inpatient Medications:  acetaminophen **OR** acetaminophen, ALPRAZolam, cyclobenzaprine, morphine injection, ondansetron **OR** ondansetron (ZOFRAN) IV, oxyCODONE, polyethylene glycol  Family History: family history includes Hypertension in her other.  The patient's family history is negative for inflammatory bowel disorders, GI malignancy, or solid organ transplantation. Both parents alive in their late 6's  Social History:   reports that she has quit smoking. She does not have any smokeless tobacco history on file. The patient denies ETOH, tobacco, or drug use.   Review of Systems: Constitutional: Weight is stable. NO chills, no fever, no dysuria, no hematuria, no skin rash, has headache today, has acid reflux, sometimes feels like food stuck in esophagus. Eyes: No changes in vision. ENT: No oral lesions, sore throat.  GI: see HPI.  Heme/Lymph: No easy bruising.  CV: No chest pain.  GU: No hematuria.  Integumentary: No rashes.  Neuro: No headaches.  Psych: No depression/anxiety.  Endocrine: No heat/cold intolerance.  Allergic/Immunologic: No urticaria.  Resp: No cough, SOB.  Musculoskeletal: No joint swelling.    Physical Examination: BP 130/59 mmHg  Pulse 96  Temp(Src) 98.2 F (36.8 C) (Oral)  Resp 18  Ht 5\' 4"  (1.626 m)  Wt 110.904 kg (244 lb 8 oz)  BMI 41.95 kg/m2  SpO2 96% Gen: NAD, alert and oriented x 4 HEENT: PEERLA, EOMI, Neck: supple, no JVD or thyromegaly Chest: CTA bilaterally, no wheezes, crackles, or other adventitious sounds CV: RRR, no m/g/c/r Abd: obese, no masses, no rebound tenderness, bowel sounds present Ext: no edema, well perfused with 2+ pulses, Skin: no rash or lesions noted Lymph: no LAD  Data: Lab Results  Component Value Date   WBC 18.8* 11/01/2015   HGB 12.3 11/01/2015   HCT 37.2 11/01/2015   MCV 98.3  11/01/2015   PLT 336 11/01/2015    Recent Labs Lab 11/01/15 1749  HGB 12.3   Lab Results  Component Value Date   NA 137 11/01/2015   K 3.4* 11/01/2015   CL 104 11/01/2015   CO2 23 11/01/2015   BUN 22* 11/01/2015   CREATININE 1.02* 11/01/2015   Lab Results  Component Value Date   ALT 26 11/01/2015   AST 25 11/01/2015   ALKPHOS 85 11/01/2015   BILITOT 0.3 11/01/2015   No results for input(s): APTT, INR, PTT in the last 168 hours. Assessment/Plan: Wendy Hubbard is a 60 y.o. female with pain in RUQ and LLQ, diarrhea, previous C. Diff, last stool test was neg 2 weeks ago.  Elevated WBC for uncertain reason.  Also climbing lactic acid level. Recommendations: Check sed  rate and CRP in morning, do EGD tomorrow for the abd pain.   Thank you for the consult. Please call with questions or concerns.  Gaylyn Cheers, MD

## 2015-11-01 NOTE — ED Notes (Addendum)
Pt states she has been seeing Dr. Vira Agar and c/o lower and upper abdominal pain. Pt states she has had CT scans and stools samples done.  Pt had blood work done this week and had an increase in WBC count.  Pt is scheduled for colonoscopy on 7/12, but may be done sooner. Pt states she also is nauseated and has had severe diarrhea along with blood in her stool.

## 2015-11-01 NOTE — ED Notes (Signed)
Admitting MD at bedside.

## 2015-11-01 NOTE — ED Notes (Signed)
Pt calling out, appears that she is upset from waiting; inquiring when doctor is coming. MD not at desk but pt chart is out of "to be seen" area; informed patient and will followup with MD.

## 2015-11-01 NOTE — ED Notes (Signed)
Patient transported to X-ray 

## 2015-11-01 NOTE — ED Notes (Signed)
MD at bedside. 

## 2015-11-01 NOTE — ED Notes (Signed)
Pt states that she had bloodwork drawn this AM at [facility] and that we could access labwork from them.  Pt being followed by Dr. Tiffany Kocher

## 2015-11-01 NOTE — H&P (Addendum)
[name]    MR#:  [id number]  DATE OF BIRTH:  1955/10/05   DATE OF ADMISSION:  11/01/2015  PRIMARY CARE PHYSICIAN: Lavera Guise, MD   REQUESTING/REFERRING PHYSICIAN: Lord  CHIEF COMPLAINT:   Chief Complaint  Patient presents with  . Abdominal Pain  . Nausea    HISTORY OF PRESENT ILLNESS:  Wendy Hubbard  is a 60 y.o. female with a known history of Type 2 diabetes non-insulin-requiring, essential hypertension who is presenting with abdominal pain. She states that she follows with Dr. Vira Agar of gastroenterology has been having chronic diarrhea for numerous months with no found etiology this has remained unchanged she is also been complaining of left lower quadrant abdominal pain for approximately 6 months in total duration she now has one day duration of right upper quadrant abdominal pain described all pains as constant "pain" 7/10 in intensity worse with movement no relieving factors. She states there is no difference in symptoms on eating or drinking. She states she did have 2 bowel movements that appeared to be maroon in color Denies further symptomatology  PAST MEDICAL HISTORY:   Past Medical History  Diagnosis Date  . Diabetes mellitus without complication   . COPD (chronic obstructive pulmonary disease)   . Hypertension   . Hyperlipidemia   . GERD (gastroesophageal reflux disease)   . Rapid heart rate     PAST SURGICAL HISTORY:   Past Surgical History  Procedure Laterality Date  . Tonsillectomy    . Abdominal hysterectomy    . Breast surgery    . Cholecystectomy    . Appendectomy    . Ankle arthroscopy Bilateral   . Knee arthroscopy Left   . Bladder suspension      SOCIAL HISTORY:   Social History  Substance Use Topics  . Smoking status: Former Research scientist (life sciences)  . Smokeless tobacco: Not on file  . Alcohol Use: Not on file    FAMILY HISTORY:   Family History  Problem Relation Age of Onset    . Hypertension Other     DRUG ALLERGIES:   Allergies  Allergen Reactions  . Flagyl [Metronidazole] Anaphylaxis and Rash  . Penicillins Anaphylaxis and Other (See Comments)    Has patient had a PCN reaction causing immediate rash, facial/tongue/throat swelling, SOB or lightheadedness with hypotension: Yes Has patient had a PCN reaction causing severe rash involving mucus membranes or skin necrosis: No Has patient had a PCN reaction that required hospitalization No Has patient had a PCN reaction occurring within the last 10 years: No If all of the above answers are "NO", then may proceed with Cephalosporin use.  Marland Kitchen Doxycycline Hives    REVIEW OF SYSTEMS:  REVIEW OF SYSTEMS:  CONSTITUTIONAL: Denies fevers, chills, fatigue, weakness.  EYES: Denies blurred vision, double vision, or eye pain.  EARS, NOSE, THROAT: Denies tinnitus, ear pain, hearing loss.  RESPIRATORY: denies cough, shortness of breath, wheezing  CARDIOVASCULAR: Denies chest pain, palpitations, edema.  GASTROINTESTINAL: Positive nausea, vomiting, diarrhea, abdominal pain.  GENITOURINARY: Denies dysuria, hematuria.  ENDOCRINE: Denies nocturia or thyroid problems. HEMATOLOGIC AND LYMPHATIC: Denies easy bruising or bleeding.  SKIN: Denies rash or lesions.  MUSCULOSKELETAL: Denies pain in neck, back, shoulder, knees, hips, or further arthritic symptoms.  NEUROLOGIC: Denies paralysis, paresthesias.  PSYCHIATRIC: Denies anxiety or depressive symptoms. Otherwise full review of systems performed by me is negative.   MEDICATIONS AT HOME:   Prior to Admission medications  Medication Sig Start Date End Date Taking? Authorizing Provider  allopurinol (ZYLOPRIM) 300 MG tablet Take 300 mg by mouth at bedtime.    Yes Historical Provider, MD  ALPRAZolam (XANAX) 0.25 MG tablet Take 0.25 mg by mouth 2 (two) times daily as needed for anxiety.   Yes Historical Provider, MD  carisoprodol (SOMA) 350 MG tablet Take 350 mg by mouth 2 (two)  times daily as needed for muscle spasms.   Yes Historical Provider, MD  escitalopram (LEXAPRO) 10 MG tablet Take 10 mg by mouth at bedtime.   Yes Historical Provider, MD  furosemide (LASIX) 40 MG tablet Take 40 mg by mouth daily.    Yes Historical Provider, MD  glimepiride (AMARYL) 1 MG tablet Take 1 mg by mouth daily with breakfast.    Yes Historical Provider, MD  HYDROcodone-acetaminophen (NORCO/VICODIN) 5-325 MG per tablet Take 1 tablet by mouth at bedtime as needed for moderate pain.    Yes Historical Provider, MD  lansoprazole (PREVACID) 30 MG capsule Take 30 mg by mouth 2 (two) times daily.    Yes Historical Provider, MD  lisinopril (PRINIVIL,ZESTRIL) 10 MG tablet Take 10 mg by mouth at bedtime.    Yes Historical Provider, MD  metoprolol succinate (TOPROL-XL) 50 MG 24 hr tablet Take 50 mg by mouth at bedtime. Take with or immediately following a meal.   Yes Historical Provider, MD  montelukast (SINGULAIR) 10 MG tablet Take 10 mg by mouth daily.   Yes Historical Provider, MD  sitaGLIPtin-metformin (JANUMET) 50-1000 MG tablet Take 1 tablet by mouth 2 (two) times daily with a meal.   Yes Historical Provider, MD  spironolactone (ALDACTONE) 50 MG tablet Take 50 mg by mouth daily.   Yes Historical Provider, MD      VITAL SIGNS:  Blood pressure 131/51, pulse 105, temperature 98.2 F (36.8 C), temperature source Oral, resp. rate 20, height 5\' 4"  (1.626 m), weight 243 lb (110.224 kg), SpO2 95 %.  PHYSICAL EXAMINATION:  VITAL SIGNS: Filed Vitals:   11/01/15 1622 11/01/15 1623  BP: 131/51   Pulse:  105  Temp:    Resp:     GENERAL:60 y.o.female currently in no acute distress. Obese HEAD: Normocephalic, atraumatic.  EYES: Pupils equal, round, reactive to light. Extraocular muscles intact. No scleral icterus.  MOUTH: Moist mucosal membrane. Dentition intact. No abscess noted.  EAR, NOSE, THROAT: Clear without exudates. No external lesions.  NECK: Supple. No thyromegaly. No nodules. No JVD.    PULMONARY: Clear to ascultation, without wheeze rails or rhonci. No use of accessory muscles, Good respiratory effort. good air entry bilaterally CHEST: Nontender to palpation.  CARDIOVASCULAR: S1 and S2. Regular rate and rhythm. No murmurs, rubs, or gallops. No edema. Pedal pulses 2+ bilaterally.  GASTROINTESTINAL: Soft, Minimal tenderness right upper quadrant and left lower quadrant without rebound or guarding, nondistended. No masses. Positive bowel sounds. No hepatosplenomegaly.  MUSCULOSKELETAL: No swelling, clubbing, or edema. Range of motion full in all extremities.  NEUROLOGIC: Cranial nerves II through XII are intact. No gross focal neurological deficits. Sensation intact. Reflexes intact.  SKIN: No ulceration, lesions, rashes, or cyanosis. Skin warm and dry. Turgor intact.  PSYCHIATRIC: Mood, affect within normal limits. The patient is awake, alert and oriented x 3. Insight, judgment intact.    LABORATORY PANEL:   CBC No results for input(s): WBC, HGB, HCT, PLT in the last 168 hours. ------------------------------------------------------------------------------------------------------------------  Chemistries   Recent Labs Lab 11/01/15 1433  NA 137  K 3.4*  CL 104  CO2 23  GLUCOSE 108*  BUN 22*  CREATININE 1.02*  CALCIUM 9.3  AST 25  ALT 26  ALKPHOS 85  BILITOT 0.3   ------------------------------------------------------------------------------------------------------------------  Cardiac Enzymes  Recent Labs Lab 11/01/15 1423  TROPONINI <0.03   ------------------------------------------------------------------------------------------------------------------  RADIOLOGY:  Ct Abdomen Pelvis W Contrast  11/01/2015  CLINICAL DATA:  Severe sharp LEFT upper quadrant pain for 24 hours, nausea, diarrhea, RIGHT-side abdominal pain, leukocytosis; prior appendectomy, hysterectomy, cholecystectomy, bladder suspension; diabetes mellitus, hypertension, COPD, GERD EXAM: CT  ABDOMEN AND PELVIS WITH CONTRAST TECHNIQUE: Multidetector CT imaging of the abdomen and pelvis was performed using the standard protocol following bolus administration of intravenous contrast. Sagittal and coronal MPR images reconstructed from axial data set. CONTRAST:  130mL ISOVUE-300 IOPAMIDOL (ISOVUE-300) INJECTION 61% IV. Dilute oral contrast. COMPARISON:  10/23/2015 FINDINGS: Lower chest:  Lung bases clear. Hepatobiliary: Diffuse fatty infiltration of the liver. Gallbladder surgically absent. No biliary dilatation. Pancreas: Normal appearance Spleen: Normal appearance Adrenals/Urinary Tract: Small LEFT renal cyst. Otherwise normal appearing kidneys without mass or hydronephrosis. Normal appearing adrenal glands. Single BILATERAL unremarkable ureters. Normal bladder. Stomach/Bowel: Appendix surgically absent by history. Stomach and bowel loops normal appearance. Vascular/Lymphatic: Atherosclerotic calcifications aorta. No adenopathy. Reproductive: Uterus surgically absent. RIGHT ovary not visualized. LEFT ovary normal appearance. Other: No mass, free air, free fluid, or hernia. Musculoskeletal: No acute osseous findings. IMPRESSION: Diffuse fatty infiltration of liver. Small LEFT renal cyst. Aortic atherosclerosis. Otherwise negative exam. Electronically Signed   By: Lavonia Dana M.D.   On: 11/01/2015 16:29    EKG:   Orders placed or performed during the hospital encounter of 11/01/15  . ED EKG  . ED EKG    IMPRESSION AND PLAN:   60 year old Caucasian female history of type 2 diabetes non-insulin-requiring presenting with abdominal pain and maroon stools  1. Bright red blood per rectum: GI consult trend CBC transfuse hemoglobin less than 7 2. Intractable abdominal pain and CAT scan pending at this time continue supportive measures 3. Type 2 diabetes non-insulin-requiring hold oral agents at insulin sliding scale 4. Essential hypertension lisinopril 5. GERD without esophagitis PPI  therapy    All the records are reviewed and case discussed with ED provider. Management plans discussed with the patient, family and they are in agreement.  CODE STATUS: Full  TOTAL TIME TAKING CARE OF THIS PATIENT: 33 minutes.    Hower,  Karenann Cai.D on 11/01/2015 at 4:36 PM  Between 7am to 6pm - Pager - [phone]  After 6pm: House Pager: - [phone]  [facility]  Office  [phone]  CC: Primary care physician; Lavera Guise, MD

## 2015-11-01 NOTE — ED Provider Notes (Signed)
[facility] Emergency Department Provider Note   ____________________________________________  Time seen:  I have reviewed the triage vital signs and the triage nursing note.  HISTORY  Chief Complaint Abdominal Pain and Nausea   Historian Patient  HPI Wendy Hubbard is a 60 y.o. female here for evaluation of abdominal pain. She has been worked up by Dr. Vira Agar with gastroenterology for abdominal pain mostly left lower quadrant over the past 6 months. She recently week or so ago had a CT scan which was nondiagnostic, showing no certain cause for her pain. Because of a minimally elevated white blood cell count of 14, she was even a course of antibiotics for which she does not recall the name, and she states she was also on a course of prednisone. She finished those these about 3 days ago, she states was a seven-day course. Next  She's also had some intermittent right upper quadrant pains, but overnight she had more severe right flank and right upper quadrant pains. She has had a history of prior cholecystectomy. She's also had priorhistory to me and appendectomy.  She's not had a fever. She does have chronic diarrhea.  She states that she is supposed to have a colonoscopy scheduled in the next few weeks for further evaluation of chronic abdominal pain and diarrhea with Dr. Vira Agar. She called the nurse this morning who asked her to come into the ED for further evaluation.  She denies black or bloody stools now.  Denies cough or trouble breathing or chest pain.  She had a repeat CBC this morning in order to check her white blood cell count and hemoglobin. Her labs were done at [facility] and showed minimal elevation of 15 of white blood count, and no anemia.    Past Medical History  Diagnosis Date  . Diabetes mellitus without complication   . COPD (chronic obstructive pulmonary disease)   . Hypertension   . Hyperlipidemia   . GERD (gastroesophageal  reflux disease)   . Rapid heart rate     There are no active problems to display for this patient.   Past Surgical History  Procedure Laterality Date  . Tonsillectomy    . Abdominal hysterectomy    . Breast surgery    . Cholecystectomy    . Appendectomy    . Ankle arthroscopy Bilateral   . Knee arthroscopy Left   . Bladder suspension      Current Outpatient Rx  Name  Route  Sig  Dispense  Refill  . allopurinol (ZYLOPRIM) 300 MG tablet   Oral   Take 1 tablet by mouth daily.      0   . ALPRAZolam (XANAX) 0.25 MG tablet   Oral   Take 0.25 mg by mouth 2 (two) times daily as needed for anxiety.         . cyclobenzaprine (FLEXERIL) 10 MG tablet   Oral   Take 1 tablet (10 mg total) by mouth 2 (two) times daily as needed for muscle spasms.   20 tablet   0   . furosemide (LASIX) 40 MG tablet   Oral   Take 1 tablet by mouth daily.      0   . glimepiride (AMARYL) 1 MG tablet   Oral   Take 1 tablet by mouth daily.      0   . HYDROcodone-acetaminophen (NORCO/VICODIN) 5-325 MG per tablet   Oral   Take 1 tablet by mouth at bedtime as needed. pain  0   . lansoprazole (PREVACID) 30 MG capsule   Oral   Take 1 capsule by mouth 2 (two) times daily.      0   . lisinopril (PRINIVIL,ZESTRIL) 10 MG tablet      take 1 tablet by mouth once daily         . LYRICA 75 MG capsule   Oral   Take 1 capsule by mouth 2 (two) times daily.      0     Dispense as written.   . naproxen (NAPROSYN) 375 MG tablet   Oral   Take 1 tablet (375 mg total) by mouth 2 (two) times daily.   20 tablet   0   . oxyCODONE-acetaminophen (PERCOCET/ROXICET) 5-325 MG per tablet   Oral   Take 1-2 tablets by mouth every 6 (six) hours as needed for severe pain.   15 tablet   0   . sitaGLIPtin-metformin (JANUMET) 50-500 MG per tablet   Oral   Take 1 tablet by mouth 2 (two) times daily with a meal.         . spironolactone (ALDACTONE) 50 MG tablet   Oral   Take 50 mg by mouth  daily.      0     Allergies Flagyl; Penicillins; and Doxycycline  No family history on file.  Social History Social History  Substance Use Topics  . Smoking status: Not on file  . Smokeless tobacco: Not on file  . Alcohol Use: Not on file    Review of Systems  Constitutional: Negative for fever. Eyes: Negative for visual changes. ENT: Negative for sore throat. Cardiovascular: Negative for chest pain. Respiratory: Negative for shortness of breath. Gastrointestinal:No vomiting. Other as per history of present illness. Genitourinary: Negative for dysuria. No hematuria Musculoskeletal: Negative for back pain. Skin: Negative for rash. Neurological: Negative for headache. 10 point Review of Systems otherwise negative ____________________________________________   PHYSICAL EXAM:  VITAL SIGNS: ED Triage Vitals  Enc Vitals Group     BP 11/01/15 1259 117/56 mmHg     Pulse --      Resp 11/01/15 1259 20     Temp 11/01/15 1259 98.2 F (36.8 C)     Temp Source 11/01/15 1259 Oral     SpO2 11/01/15 1259 95 %     Weight 11/01/15 1259 243 lb (110.224 kg)     Height 11/01/15 1259 5\' 4"  (1.626 m)     Head Cir --      Peak Flow --      Pain Score 11/01/15 1257 9     Pain Loc --      Pain Edu? --      Excl. in [place]? --      Constitutional: Alert and oriented. Well appearing overall but wincing with pain. HEENT   Head: Normocephalic and atraumatic.      Eyes: Conjunctivae are normal. PERRL. Normal extraocular movements.      Ears:         Nose: No congestion/rhinnorhea.   Mouth/Throat: Mucous membranes are moist.   Neck: No stridor. Cardiovascular/Chest: Normal rate, regular rhythm.  No murmurs, rubs, or gallops. Respiratory: Normal respiratory effort without tachypnea nor retractions. Breath sounds are clear and equal bilaterally. No wheezes/rales/rhonchi. Gastrointestinal: Soft. Obese. Moderate right-sided abdominal tenderness. Mild diffuse and left-sided  tenderness. No significant guarding or rebound. Genitourinary/rectal:Deferred Musculoskeletal: Nontender with normal range of motion in all extremities. No joint effusions.  No lower extremity tenderness.  No edema. Neurologic:  Normal speech and language. No gross or focal neurologic deficits are appreciated. Skin:  Skin is warm, dry and intact. No rash noted. Psychiatric: Mood and affect are normal. Speech and behavior are normal. Patient exhibits appropriate insight and judgment.  ____________________________________________   EKG I, Lisa Roca, MD, the attending physician have personally viewed and interpreted all ECGs.  90 bpm. Normal sinus rhythm. Narrow QRS. Normal axis. Normal ST and T-wave ____________________________________________  LABS (pertinent positives/negatives)  Labs Reviewed  COMPREHENSIVE METABOLIC PANEL - Abnormal; Notable for the following:    Potassium 3.4 (*)    Glucose, Bld 108 (*)    BUN 22 (*)    Creatinine, Ser 1.02 (*)    GFR calc non Af Amer 59 (*)    All other components within normal limits  LIPASE, BLOOD  LACTIC ACID, PLASMA  URINALYSIS COMPLETEWITH MICROSCOPIC ([facility] ONLY)  LACTIC ACID, PLASMA  TROPONIN I    ____________________________________________  RADIOLOGY All Xrays were viewed by me. Imaging interpreted by Radiologist.  CT abdomen pelvis with contrast: Pending __________________________________________  PROCEDURES  Procedure(s) performed: None  Critical Care performed: None  ____________________________________________   ED COURSE / ASSESSMENT AND PLAN  Pertinent labs & imaging results that were available during my care of the patient were reviewed by me and considered in my medical decision making (see chart for details).   The patient is here for uncontrolled pain established been having chronic abdominal pain mostly in the left side but also occasionally in the right upper quadrant, but today more so in the right  upper quadrant. She is not really reporting urinary symptoms, however we'll obtain a urinalysis, as well as labs.  I was able to review her current clinic labs from this morning showing her white blood count is still a little elevated at 15, her hemoglobin is 12.5 and consistent with prior/stable.  I am sending metabolic panel, lipase, and obtain EKG and troponin as well.  Patient seemed interested in having her colonoscopy be moved up. When I read the last note from gastrology, Dr. Vira Agar, sounded like it may be moved up from mid July to this Wednesday.  I spoke with Dr. Allen Norris, not patient's GI.  I spoke with Dr. Vira Agar, who has not physically seen this patient, but has had discussion with his advanced practice practitioner who had been managing the patient. He was a little concerned about her atherosclerotic disease seen on the recent CT scan. I did send a lactate and this was normal.  We discussed that the white blood cell count was elevated and consideration of possible antibiotics and admission for pain control. He would be able to see her in the hospital tomorrow if she is admitted.  I spoke with the patient after a dose of morphine and she states that the pain came right back and had severe and so I will dose her with Dilaudid.  I discussed with her that I don't think that she would necessarily need a repeat CT scan for the chronic ongoing left lower quadrant pain, but this new severe right upper quadrant pain for the last 24 hours if that is indeed new, and severe, she may need imaging.  Patient states that she understand the risk of radiation exposure and wants to proceed with CT scan for further evaluation.  I doubt that she is going to get pain control and will likely need hospital admission.  Patient care transferred to Dr. Waylan Rocher at shift change 3:30 PM. CT scan pending. Troponin pending I  have not started antibiotics as I think the white blood cell count may be related to recent  prednisone use, however the pain with a CT scan might show, may consider antibiotics.    CONSULTATIONS:   I spoke with Dr. Allen Norris, on call for GI -- prior to labs back -- we discussed if no indication for emergency GI procedure tonight, there will be no GI coverage over the weekend in terms of obtaining a colonoscopy this weekend.  I also spoke with Dr. Vira Agar, GI.   Patient / Family / Caregiver informed of clinical course, medical decision-making process, and agree with plan.     ___________________________________________   FINAL CLINICAL IMPRESSION(S) / ED DIAGNOSES   Final diagnoses:  Right upper quadrant abdominal pain  Abdominal pain, left lower quadrant              Note: This dictation was prepared with Dragon dictation. Any transcriptional errors that result from this process are unintentional   Lisa Roca, MD 11/01/15 [phone]

## 2015-11-01 NOTE — ED Notes (Signed)
Pt taken to floor by ED Rod Mae. Pt alert and oriented X4, active, cooperative, pt in NAD. RR even and unlabored, color WNL.

## 2015-11-02 ENCOUNTER — Observation Stay: Payer: BLUE CROSS/BLUE SHIELD | Admitting: Anesthesiology

## 2015-11-02 ENCOUNTER — Encounter
Admission: EM | Disposition: A | Discharge status: Home / Self Care | Payer: Self-pay | Source: Home / Self Care | Attending: Emergency Medicine

## 2015-11-02 DIAGNOSIS — G8929 Other chronic pain: Secondary | ICD-10-CM | POA: Diagnosis not present

## 2015-11-02 HISTORY — PX: ESOPHAGOGASTRODUODENOSCOPY: SHX5428

## 2015-11-02 LAB — CBC
HCT: 34.1 % — ABNORMAL LOW (ref 35.0–47.0)
HCT: 33.7 % — ABNORMAL LOW (ref 35.0–47.0)
HCT: 34.3 % — ABNORMAL LOW (ref 35.0–47.0)
Hemoglobin: 11.5 g/dL — ABNORMAL LOW (ref 12.0–16.0)
Hemoglobin: 11.3 g/dL — ABNORMAL LOW (ref 12.0–16.0)
Hemoglobin: 11.9 g/dL — ABNORMAL LOW (ref 12.0–16.0)
MCH: 32.4 pg (ref 26.0–34.0)
MCH: 32.7 pg (ref 26.0–34.0)
MCH: 33.3 pg (ref 26.0–34.0)
MCHC: 33.6 g/dL (ref 32.0–36.0)
MCHC: 33.6 g/dL (ref 32.0–36.0)
MCHC: 34.8 g/dL (ref 32.0–36.0)
MCV: 96.4 fL (ref 80.0–100.0)
MCV: 97.5 fL (ref 80.0–100.0)
MCV: 95.5 fL (ref 80.0–100.0)
Platelets: 280 10*3/uL (ref 150–440)
Platelets: 295 10*3/uL (ref 150–440)
Platelets: 287 10*3/uL (ref 150–440)
RBC: 3.54 MIL/uL — ABNORMAL LOW (ref 3.80–5.20)
RBC: 3.46 MIL/uL — ABNORMAL LOW (ref 3.80–5.20)
RBC: 3.59 MIL/uL — ABNORMAL LOW (ref 3.80–5.20)
RDW: 13.5 % (ref 11.5–14.5)
RDW: 13.6 % (ref 11.5–14.5)
RDW: 13.8 % (ref 11.5–14.5)
WBC: 12.7 10*3/uL — ABNORMAL HIGH (ref 3.6–11.0)
WBC: 11.8 10*3/uL — ABNORMAL HIGH (ref 3.6–11.0)
WBC: 12.8 10*3/uL — ABNORMAL HIGH (ref 3.6–11.0)

## 2015-11-02 LAB — GLUCOSE, CAPILLARY
Glucose-Capillary: 112 mg/dL — ABNORMAL HIGH (ref 65–99)
Glucose-Capillary: 216 mg/dL — ABNORMAL HIGH (ref 65–99)
Glucose-Capillary: 136 mg/dL — ABNORMAL HIGH (ref 65–99)
Glucose-Capillary: 174 mg/dL — ABNORMAL HIGH (ref 65–99)

## 2015-11-02 LAB — HEMOGLOBIN A1C: Hgb A1c MFr Bld: 6.7 % — ABNORMAL HIGH (ref 4.0–6.0)

## 2015-11-02 LAB — SEDIMENTATION RATE: Sed Rate: 29 mm/hr (ref 0–30)

## 2015-11-02 LAB — C-REACTIVE PROTEIN: CRP: 2.8 mg/dL — ABNORMAL HIGH (ref <1.0)

## 2015-11-02 SURGERY — EGD (ESOPHAGOGASTRODUODENOSCOPY)
Anesthesia: General

## 2015-11-02 MED ORDER — MONTELUKAST SODIUM 10 MG PO TABS
10.0000 mg | ORAL_TABLET | Freq: Every day | ORAL | Status: DC
  Administered 2015-11-03 – 2015-11-04 (×2): 10 mg via ORAL
  Filled 2015-11-02 (×2): qty 1

## 2015-11-02 MED ORDER — GABAPENTIN 300 MG PO CAPS
300.0000 mg | ORAL_CAPSULE | Freq: Three times a day (TID) | ORAL | Status: DC
  Administered 2015-11-02 – 2015-11-04 (×7): 300 mg via ORAL
  Filled 2015-11-02 (×7): qty 1

## 2015-11-02 MED ORDER — ESCITALOPRAM OXALATE 10 MG PO TABS
10.0000 mg | ORAL_TABLET | Freq: Every day | ORAL | Status: DC
  Administered 2015-11-02 – 2015-11-03 (×2): 10 mg via ORAL
  Filled 2015-11-02 (×2): qty 1

## 2015-11-02 MED ORDER — PROPOFOL 10 MG/ML IV BOLUS
INTRAVENOUS | Status: DC | PRN
  Administered 2015-11-02: 50 mg via INTRAVENOUS

## 2015-11-02 MED ORDER — ONDANSETRON HCL 4 MG/2ML IJ SOLN
INTRAMUSCULAR | Status: DC | PRN
  Administered 2015-11-02: 4 mg via INTRAVENOUS

## 2015-11-02 MED ORDER — LIDOCAINE HCL (CARDIAC) 20 MG/ML IV SOLN
INTRAVENOUS | Status: DC | PRN
  Administered 2015-11-02: 100 mg via INTRAVENOUS

## 2015-11-02 MED ORDER — METOPROLOL SUCCINATE ER 50 MG PO TB24
50.0000 mg | ORAL_TABLET | Freq: Every day | ORAL | Status: DC
  Administered 2015-11-02 – 2015-11-03 (×2): 50 mg via ORAL
  Filled 2015-11-02 (×2): qty 1

## 2015-11-02 MED ORDER — FENTANYL CITRATE (PF) 100 MCG/2ML IJ SOLN
INTRAMUSCULAR | Status: DC | PRN
  Administered 2015-11-02: 50 ug via INTRAVENOUS

## 2015-11-02 MED ORDER — PEG 3350-KCL-NA BICARB-NACL 420 G PO SOLR
4000.0000 mL | Freq: Once | ORAL | Status: AC
  Administered 2015-11-03: 4000 mL via ORAL
  Filled 2015-11-02: qty 4000

## 2015-11-02 MED ORDER — SODIUM CHLORIDE 0.9 % IV SOLN: INTRAVENOUS | Status: DC

## 2015-11-02 MED ORDER — SODIUM CHLORIDE 0.9 % IV SOLN
INTRAVENOUS | Status: DC | PRN
Start: 2015-11-02 — End: 2015-11-02
  Administered 2015-11-02: 08:00:00 via INTRAVENOUS

## 2015-11-02 NOTE — Op Note (Signed)
[facility] Gastroenterology Patient Name: Wendy Hubbard Procedure Date: 11/02/2015 7:44 AM MRN: [id number] Account #: [id number] Date of Birth: Sep 25, 1955 Admit Type: Outpatient Age: 60 Room: [facility] ENDO ROOM 3 Gender: Female Note Status: Finalized Procedure:            Upper GI endoscopy Indications:          Abdominal pain in the right upper quadrant Providers:            Manya Silvas, MD Referring MD:         Lavera Guise, MD (Referring MD) Medicines:            Propofol per Anesthesia Complications:        No immediate complications. Procedure:            Pre-Anesthesia Assessment:                       - After reviewing the risks and benefits, the patient                        was deemed in satisfactory condition to undergo the                        procedure.                       - Using IV propofol under the supervision of a CRNA was                        determined to be medically necessary for this procedure                        based on review of the patient's medical history,                        medications, and prior anesthesia history.                       After obtaining informed consent, the endoscope was                        passed under direct vision. Throughout the procedure,                        the patient's blood pressure, pulse, and oxygen                        saturations were monitored continuously. The Endoscope                        was introduced through the mouth, and advanced to the                        second part of duodenum. The upper GI endoscopy was                        accomplished without difficulty. The patient tolerated                        the procedure well. Findings:      The examined esophagus was normal. GEJ.  Diffuse mildly erythematous mucosa without bleeding was found at the       gastroesophageal junction, in the gastric body and in the gastric       antrum. Biopsies were taken with a  cold forceps for histology. Biopsies       were taken with a cold forceps for Helicobacter pylori testing. No       erosions, no ulcers.      The examined duodenum was normal. Impression:           - Normal esophagus.                       - Erythematous mucosa in the gastroesophageal junction,                        gastric body and antrum. Biopsied.                       - Normal examined duodenum. Recommendation:       - Await pathology results. Manya Silvas, MD 11/02/2015 8:51:00 AM This report has been signed electronically. Number of Addenda: 0 Note Initiated On: 11/02/2015 7:44 AM      [facility]

## 2015-11-02 NOTE — Progress Notes (Signed)
[facility] [place] NAME: Wendy Hubbard    MR#:  [id number]  DATE OF BIRTH:  1955-08-09  SUBJECTIVE:   Patient is here due to chronic abdominal pain, nausea and also diarrhea. Still having some diffuse abdominal pain. Had an endoscopy today showing some mild gastritis otherwise no other acute pathology. Going for colonoscopy on Monday.  REVIEW OF SYSTEMS:    Review of Systems  Constitutional: Negative for fever and chills.  HENT: Negative for congestion and tinnitus.   Eyes: Negative for blurred vision and double vision.  Respiratory: Negative for cough, shortness of breath and wheezing.   Cardiovascular: Negative for chest pain, orthopnea and PND.  Gastrointestinal: Positive for abdominal pain. Negative for nausea, vomiting and diarrhea.  Genitourinary: Negative for dysuria and hematuria.  Neurological: Negative for dizziness, sensory change and focal weakness.  All other systems reviewed and are negative.   Nutrition: Full Liquid Tolerating Diet: yes Tolerating PT: ambulatory   DRUG ALLERGIES:   Allergies  Allergen Reactions  . Flagyl [Metronidazole] Anaphylaxis and Rash  . Penicillins Anaphylaxis and Other (See Comments)    Has patient had a PCN reaction causing immediate rash, facial/tongue/throat swelling, SOB or lightheadedness with hypotension: Yes Has patient had a PCN reaction causing severe rash involving mucus membranes or skin necrosis: No Has patient had a PCN reaction that required hospitalization No Has patient had a PCN reaction occurring within the last 10 years: No If all of the above answers are "NO", then may proceed with Cephalosporin use.  Marland Kitchen Doxycycline Hives    VITALS:  Blood pressure 133/47, pulse 105, temperature 99.1 F (37.3 C), temperature source Oral, resp. rate 17, height 5\' 4"  (1.626 m), weight 110.904 kg (244 lb 8 oz), SpO2 97 %.  PHYSICAL EXAMINATION:   Physical Exam  GENERAL:  60 y.o.-year-old  obese patient lying in the bed with no acute distress.  EYES: Pupils equal, round, reactive to light and accommodation. No scleral icterus. Extraocular muscles intact.  HEENT: Head atraumatic, normocephalic. Oropharynx and nasopharynx clear.  NECK:  Supple, no jugular venous distention. No thyroid enlargement, no tenderness.  LUNGS: Normal breath sounds bilaterally, no wheezing, rales, rhonchi. No use of accessory muscles of respiration.  CARDIOVASCULAR: S1, S2 normal. No murmurs, rubs, or gallops.  ABDOMEN: Soft, Tender diffusely but no rebound, rigidity, nondistended. Bowel sounds present. No organomegaly or mass.  EXTREMITIES: No cyanosis, clubbing or edema b/l.    NEUROLOGIC: Cranial nerves II through XII are intact. No focal Motor or sensory deficits b/l.   PSYCHIATRIC: The patient is alert and oriented x 3.  SKIN: No obvious rash, lesion, or ulcer.    LABORATORY PANEL:   CBC  Recent Labs Lab 11/02/15 1052  WBC 11.8*  HGB 11.3*  HCT 33.7*  PLT 295   ------------------------------------------------------------------------------------------------------------------  Chemistries   Recent Labs Lab 11/01/15 1433  NA 137  K 3.4*  CL 104  CO2 23  GLUCOSE 108*  BUN 22*  CREATININE 1.02*  CALCIUM 9.3  AST 25  ALT 26  ALKPHOS 85  BILITOT 0.3   ------------------------------------------------------------------------------------------------------------------  Cardiac Enzymes  Recent Labs Lab 11/01/15 1423  TROPONINI <0.03   ------------------------------------------------------------------------------------------------------------------  RADIOLOGY:  Ct Abdomen Pelvis W Contrast  11/01/2015  CLINICAL DATA:  Severe sharp LEFT upper quadrant pain for 24 hours, nausea, diarrhea, RIGHT-side abdominal pain, leukocytosis; prior appendectomy, hysterectomy, cholecystectomy, bladder suspension; diabetes mellitus, hypertension, COPD, GERD EXAM: CT ABDOMEN AND PELVIS WITH  CONTRAST TECHNIQUE: Multidetector CT imaging  of the abdomen and pelvis was performed using the standard protocol following bolus administration of intravenous contrast. Sagittal and coronal MPR images reconstructed from axial data set. CONTRAST:  190mL ISOVUE-300 IOPAMIDOL (ISOVUE-300) INJECTION 61% IV. Dilute oral contrast. COMPARISON:  10/23/2015 FINDINGS: Lower chest:  Lung bases clear. Hepatobiliary: Diffuse fatty infiltration of the liver. Gallbladder surgically absent. No biliary dilatation. Pancreas: Normal appearance Spleen: Normal appearance Adrenals/Urinary Tract: Small LEFT renal cyst. Otherwise normal appearing kidneys without mass or hydronephrosis. Normal appearing adrenal glands. Single BILATERAL unremarkable ureters. Normal bladder. Stomach/Bowel: Appendix surgically absent by history. Stomach and bowel loops normal appearance. Vascular/Lymphatic: Atherosclerotic calcifications aorta. No adenopathy. Reproductive: Uterus surgically absent. RIGHT ovary not visualized. LEFT ovary normal appearance. Other: No mass, free air, free fluid, or hernia. Musculoskeletal: No acute osseous findings. IMPRESSION: Diffuse fatty infiltration of liver. Small LEFT renal cyst. Aortic atherosclerosis. Otherwise negative exam. Electronically Signed   By: Lavonia Dana M.D.   On: 11/01/2015 16:29     ASSESSMENT AND PLAN:   60 year old female with past medical history of obese, COPD, hypertension, hyperlipidemia, GERD abdominal pain, Maroon Stools.  1. GI bleed-patient presented with some wound/bright red blood per rectum. Hemoglobin currently stable. -No acute bleeding overnight. Status post endoscopy today showing no evidence of acute bleeding but just some gastritis. - cont. PPI.  Going for colonoscopy on Monday.  - cont. Full liquid diet.   2. History of gout-continue allopurinol. No acute attack.  3. Diabetes type 2 without complication-continue sliding scale insulin.  4. Diabetic neuropathy-continue  gabapentin.  5. Anxiety-continue as needed Xanax.  6. Essential hypertension-continue lisinopril.  7. History of CHF-clinically patient is not in congestive heart failure. Continue Lasix, Aldactone, Lisinopril.   All the records are reviewed and case discussed with Care Management/Social Workerr. Management plans discussed with the patient, family and they are in agreement.  CODE STATUS: Full Code  DVT Prophylaxis: Ted's, SCD's  TOTAL TIME TAKING CARE OF THIS PATIENT: 30 minutes.   POSSIBLE D/C IN 1-2 DAYS, DEPENDING ON CLINICAL CONDITION.   Henreitta Leber M.D on 11/02/2015 at 1:46 PM  Between 7am to 6pm - Pager - [phone]  After 6pm go to [email or web address] - password [email or web address] [facility]  Office  [phone]  CC: Primary care physician; Lavera Guise, MD

## 2015-11-02 NOTE — Anesthesia Preprocedure Evaluation (Addendum)
Anesthesia Evaluation  Patient identified by MRN, date of birth, ID band Patient awake    Reviewed: Allergy & Precautions, H&P , NPO status , Patient's Chart, lab work & pertinent test results, reviewed documented beta blocker date and time   History of Anesthesia Complications Negative for: history of anesthetic complications  Airway Mallampati: II  TM Distance: <3 FB Neck ROM: full    Dental  (+) Teeth Intact   Pulmonary neg pulmonary ROS, COPD, former smoker,    Pulmonary exam normal        Cardiovascular hypertension, negative cardio ROS Normal cardiovascular exam     Neuro/Psych negative neurological ROS  negative psych ROS   GI/Hepatic negative GI ROS, Neg liver ROS, GERD  ,  Endo/Other  negative endocrine ROSdiabetes  Renal/GU negative Renal ROS  negative genitourinary   Musculoskeletal   Abdominal   Peds  Hematology negative hematology ROS (+)   Anesthesia Other Findings Past Medical History:   Diabetes mellitus without complication (HCC)                 COPD (chronic obstructive pulmonary disease) (*              Hypertension                                                 Hyperlipidemia                                               GERD (gastroesophageal reflux disease)                       Rapid heart rate                                           Past Surgical History:   TONSILLECTOMY                                                 ABDOMINAL HYSTERECTOMY                                        BREAST SURGERY                                                CHOLECYSTECTOMY                                               APPENDECTOMY  ANKLE ARTHROSCOPY                               Bilateral              KNEE ARTHROSCOPY                                Left              BLADDER SUSPENSION                                          BMI    Body Mass Index   41.94 kg/m 2     Reproductive/Obstetrics                           Anesthesia Physical Anesthesia Plan  ASA: III  Anesthesia Plan: General   Post-op Pain Management:    Induction:   Airway Management Planned: Nasal Cannula  Additional Equipment:   Intra-op Plan:   Post-operative Plan:   Informed Consent: I have reviewed the patients History and Physical, chart, labs and discussed the procedure including the risks, benefits and alternatives for the proposed anesthesia with the patient or authorized representative who has indicated his/her understanding and acceptance.   Dental Advisory Given  Plan Discussed with: Anesthesiologist  Anesthesia Plan Comments:        Anesthesia Quick Evaluation

## 2015-11-02 NOTE — Transfer of Care (Signed)
Immediate Anesthesia Transfer of Care Note  Patient: Wendy Hubbard  Procedure(s) Performed: Procedure(s): ESOPHAGOGASTRODUODENOSCOPY (EGD) (N/A)  Patient Location: PACU  Anesthesia Type:General  Level of Consciousness: sedated  Airway & Oxygen Therapy: Patient connected to nasal cannula oxygen  Post-op Assessment: Post -op Vital signs reviewed and stable  Post vital signs: stable  Last Vitals:  Filed Vitals:   11/02/15 0454 11/02/15 0842  BP: 109/42 97/44  Pulse: 90 94  Temp: 36.8 C   Resp: 20 11    Last Pain:  Filed Vitals:   11/02/15 0843  PainSc: 6          Complications: No apparent anesthesia complications

## 2015-11-03 DIAGNOSIS — G8929 Other chronic pain: Secondary | ICD-10-CM | POA: Diagnosis not present

## 2015-11-03 LAB — GLUCOSE, CAPILLARY
Glucose-Capillary: 185 mg/dL — ABNORMAL HIGH (ref 65–99)
Glucose-Capillary: 136 mg/dL — ABNORMAL HIGH (ref 65–99)
Glucose-Capillary: 151 mg/dL — ABNORMAL HIGH (ref 65–99)
Glucose-Capillary: 119 mg/dL — ABNORMAL HIGH (ref 65–99)

## 2015-11-03 MED ORDER — TRAMADOL HCL 50 MG PO TABS: 100.0000 mg | ORAL_TABLET | Freq: Three times a day (TID) | ORAL | Status: DC | PRN

## 2015-11-03 MED ORDER — TRAMADOL HCL 50 MG PO TABS
50.0000 mg | ORAL_TABLET | Freq: Once | ORAL | Status: AC
  Administered 2015-11-03: 50 mg via ORAL
  Filled 2015-11-03: qty 1

## 2015-11-03 MED ORDER — TRAMADOL HCL 50 MG PO TABS
50.0000 mg | ORAL_TABLET | Freq: Three times a day (TID) | ORAL | Status: DC | PRN
  Administered 2015-11-03: 50 mg via ORAL
  Filled 2015-11-03: qty 1

## 2015-11-03 NOTE — Progress Notes (Signed)
Wendy Hubbard at [place] NAME: Wendy Hubbard    MR#:  [id number]  DATE OF BIRTH:  1956/01/03  SUBJECTIVE:   Patient is here due to chronic abdominal pain, nausea and also diarrhea. Still having some diffuse abdominal pain. Had an endoscopy showing some mild gastritis otherwise no other acute pathology. Going for colonoscopy on Monday. Having headache today  REVIEW OF SYSTEMS:    Review of Systems  Constitutional: Negative for fever and chills.  HENT: Negative for congestion and tinnitus.   Eyes: Negative for blurred vision and double vision.  Respiratory: Negative for cough, shortness of breath and wheezing.   Cardiovascular: Negative for chest pain, orthopnea and PND.  Gastrointestinal: Positive for abdominal pain. Negative for nausea, vomiting and diarrhea.  Genitourinary: Negative for dysuria and hematuria.  Neurological: Negative for dizziness, sensory change and focal weakness.  All other systems reviewed and are negative.   Nutrition: Full Liquid Tolerating Diet: yes Tolerating PT: ambulatory   DRUG ALLERGIES:   Allergies  Allergen Reactions  . Flagyl [Metronidazole] Anaphylaxis and Rash  . Penicillins Anaphylaxis and Other (See Comments)    Has patient had a PCN reaction causing immediate rash, facial/tongue/throat swelling, SOB or lightheadedness with hypotension: Yes Has patient had a PCN reaction causing severe rash involving mucus membranes or skin necrosis: No Has patient had a PCN reaction that required hospitalization No Has patient had a PCN reaction occurring within the last 10 years: No If all of the above answers are "NO", then may proceed with Cephalosporin use.  Marland Kitchen Doxycycline Hives    VITALS:  Blood pressure 106/45, pulse 57, temperature 98.2 F (36.8 C), temperature source Oral, resp. rate 17, height 5\' 4"  (1.626 m), weight 110.904 kg (244 lb 8 oz), SpO2 98 %.  PHYSICAL EXAMINATION:   Physical Exam  GENERAL:   60 y.o.-year-old obese patient lying in the bed with no acute distress.  EYES: Pupils equal, round, reactive to light and accommodation. No scleral icterus. Extraocular muscles intact.  HEENT: Head atraumatic, normocephalic. Oropharynx and nasopharynx clear.  NECK:  Supple, no jugular venous distention. No thyroid enlargement, no tenderness.  LUNGS: Normal breath sounds bilaterally, no wheezing, rales, rhonchi. No use of accessory muscles of respiration.  CARDIOVASCULAR: S1, S2 normal. No murmurs, rubs, or gallops.  ABDOMEN: Soft, Tender diffusely but no rebound, rigidity, nondistended. Bowel sounds present. No organomegaly or mass.  EXTREMITIES: No cyanosis, clubbing or edema b/l.    NEUROLOGIC: Cranial nerves II through XII are intact. No focal Motor or sensory deficits b/l.   PSYCHIATRIC: The patient is alert and oriented x 3.  SKIN: No obvious rash, lesion, or ulcer.    LABORATORY PANEL:   CBC  Recent Labs Lab 11/02/15 1654  WBC 12.8*  HGB 11.9*  HCT 34.3*  PLT 287   ------------------------------------------------------------------------------------------------------------------  Chemistries   Recent Labs Lab 11/01/15 1433  NA 137  K 3.4*  CL 104  CO2 23  GLUCOSE 108*  BUN 22*  CREATININE 1.02*  CALCIUM 9.3  AST 25  ALT 26  ALKPHOS 85  BILITOT 0.3   ------------------------------------------------------------------------------------------------------------------  Cardiac Enzymes  Recent Labs Lab 11/01/15 1423  TROPONINI <0.03   ------------------------------------------------------------------------------------------------------------------  RADIOLOGY:  Ct Abdomen Pelvis W Contrast  11/01/2015  CLINICAL DATA:  Severe sharp LEFT upper quadrant pain for 24 hours, nausea, diarrhea, RIGHT-side abdominal pain, leukocytosis; prior appendectomy, hysterectomy, cholecystectomy, bladder suspension; diabetes mellitus, hypertension, COPD, GERD EXAM: CT ABDOMEN AND  PELVIS WITH CONTRAST TECHNIQUE: Multidetector  CT imaging of the abdomen and pelvis was performed using the standard protocol following bolus administration of intravenous contrast. Sagittal and coronal MPR images reconstructed from axial data set. CONTRAST:  165mL ISOVUE-300 IOPAMIDOL (ISOVUE-300) INJECTION 61% IV. Dilute oral contrast. COMPARISON:  10/23/2015 FINDINGS: Lower chest:  Lung bases clear. Hepatobiliary: Diffuse fatty infiltration of the liver. Gallbladder surgically absent. No biliary dilatation. Pancreas: Normal appearance Spleen: Normal appearance Adrenals/Urinary Tract: Small LEFT renal cyst. Otherwise normal appearing kidneys without mass or hydronephrosis. Normal appearing adrenal glands. Single BILATERAL unremarkable ureters. Normal bladder. Stomach/Bowel: Appendix surgically absent by history. Stomach and bowel loops normal appearance. Vascular/Lymphatic: Atherosclerotic calcifications aorta. No adenopathy. Reproductive: Uterus surgically absent. RIGHT ovary not visualized. LEFT ovary normal appearance. Other: No mass, free air, free fluid, or hernia. Musculoskeletal: No acute osseous findings. IMPRESSION: Diffuse fatty infiltration of liver. Small LEFT renal cyst. Aortic atherosclerosis. Otherwise negative exam. Electronically Signed   By: Lavonia Dana M.D.   On: 11/01/2015 16:29     ASSESSMENT AND PLAN:   60 year old female with past medical history of obese, COPD, hypertension, hyperlipidemia, GERD abdominal pain, Maroon Stools.  1. GI bleed-patient presented with some wound/bright red blood per rectum. Hemoglobin currently stable. -No acute bleeding overnight. Status post endoscopy today showing no evidence of acute bleeding but just some gastritis. - cont. PPI.  Going for colonoscopy on Monday.  - cont. Full liquid diet.   2. History of gout-continue allopurinol. No acute attack.  3. Diabetes type 2 without complication-continue sliding scale insulin.  4. Diabetic  neuropathy-continue gabapentin.  5. Anxiety-continue as needed Xanax.  6. Essential hypertension-continue lisinopril.  7. History of CHF-clinically patient is not in congestive heart failure. Continue Lasix, Aldactone, Lisinopril.   All the records are reviewed and case discussed with Care Management/Social Workerr. Management plans discussed with the patient, family and they are in agreement.  CODE STATUS: Full Code  DVT Prophylaxis: Ted's, SCD's  TOTAL TIME TAKING CARE OF THIS PATIENT: 30 minutes.   POSSIBLE D/C IN 1-2 DAYS, DEPENDING ON CLINICAL CONDITION.   Raynie Steinhaus M.D on 11/03/2015 at 2:12 PM  Between 7am to 6pm - Pager - [phone]  After 6pm go to [email or web address] - password [email or web address] [facility]  Office  [phone]  CC: Primary care physician; Lavera Guise, MD

## 2015-11-03 NOTE — Progress Notes (Signed)
Per Dr. Posey Pronto okay to place order for tramadol 50mg  Po q 8 hours PRN. Per Md discontinue order for oxycodone.

## 2015-11-04 ENCOUNTER — Observation Stay: Payer: BLUE CROSS/BLUE SHIELD | Admitting: Anesthesiology

## 2015-11-04 ENCOUNTER — Encounter: Payer: Self-pay | Admitting: Anesthesiology

## 2015-11-04 ENCOUNTER — Encounter
Admission: EM | Disposition: A | Discharge status: Home / Self Care | Payer: Self-pay | Source: Home / Self Care | Attending: Emergency Medicine

## 2015-11-04 DIAGNOSIS — G8929 Other chronic pain: Secondary | ICD-10-CM | POA: Diagnosis not present

## 2015-11-04 HISTORY — PX: COLONOSCOPY: SHX5424

## 2015-11-04 LAB — GLUCOSE, CAPILLARY
Glucose-Capillary: 149 mg/dL — ABNORMAL HIGH (ref 65–99)
Glucose-Capillary: 153 mg/dL — ABNORMAL HIGH (ref 65–99)

## 2015-11-04 SURGERY — COLONOSCOPY
Anesthesia: General

## 2015-11-04 MED ORDER — TRAMADOL HCL 50 MG PO TABS: 50.0000 mg | ORAL_TABLET | Freq: Three times a day (TID) | ORAL | Status: DC | PRN

## 2015-11-04 MED ORDER — FENTANYL CITRATE (PF) 100 MCG/2ML IJ SOLN: 25.0000 ug | INTRAMUSCULAR | Status: DC | PRN

## 2015-11-04 MED ORDER — PHENYLEPHRINE HCL 10 MG/ML IJ SOLN
INTRAMUSCULAR | Status: DC | PRN
  Administered 2015-11-04: 200 ug via INTRAVENOUS

## 2015-11-04 MED ORDER — PROPOFOL 500 MG/50ML IV EMUL
INTRAVENOUS | Status: DC | PRN
  Administered 2015-11-04: 150 ug/kg/min via INTRAVENOUS

## 2015-11-04 MED ORDER — LIDOCAINE HCL (CARDIAC) 20 MG/ML IV SOLN
INTRAVENOUS | Status: DC | PRN
  Administered 2015-11-04: 50 mg via INTRAVENOUS

## 2015-11-04 MED ORDER — PROPOFOL 10 MG/ML IV BOLUS
INTRAVENOUS | Status: DC | PRN
  Administered 2015-11-04: 50 mg via INTRAVENOUS
  Administered 2015-11-04: 30 mg via INTRAVENOUS

## 2015-11-04 MED ORDER — ONDANSETRON HCL 4 MG/2ML IJ SOLN: 4.0000 mg | Freq: Once | INTRAMUSCULAR | Status: DC | PRN

## 2015-11-04 NOTE — Discharge Summary (Signed)
[place] at [place] NAME: Wendy Hubbard    MR#:  [id number]  DATE OF BIRTH:  February 25, 1956  DATE OF ADMISSION:  11/01/2015 ADMITTING PHYSICIAN: Lytle Butte, MD  DATE OF DISCHARGE: 11/04/15  PRIMARY CARE PHYSICIAN: Lavera Guise, MD    ADMISSION DIAGNOSIS:  Abdominal pain, left lower quadrant [R10.32] Right upper quadrant abdominal pain [R10.11]  DISCHARGE DIAGNOSIS:  Acute on chronic abdominal pain (w/u so far negative including EGD and colonoscopy)  SECONDARY DIAGNOSIS:   Past Medical History  Diagnosis Date  . Diabetes mellitus without complication ([place])   . COPD (chronic obstructive pulmonary disease) ([place])   . Hypertension   . Hyperlipidemia   . GERD (gastroesophageal reflux disease)   . Rapid heart rate     HOSPITAL COURSE:  60 year old female with past medical history of obese, COPD, hypertension, hyperlipidemia, GERD abdominal pain, Maroon Stools.  1. GI bleed- likely due to hemorrhoids -patient presented with some wound/bright red blood per rectum. Hemoglobin currently stable. -No acute bleeding overnight. Status post endoscopy today showing no evidence of acute bleeding but just some gastritis. - cont. PPI.  -colonoscopy negative other than hemorrhoids -pt has chronic intermittent abdominal pain likely due to adhesions from previous abd surgeries although no obvious bowel obstruction  2. History of gout-continue allopurinol. No acute attack.  3. Diabetes type 2 without complication-continue sliding scale insulin.  4. Diabetic neuropathy-continue gabapentin.  5. Anxiety-continue as needed Xanax.  6. Essential hypertension-continue lisinopril.  7. History of CHF-clinically patient is not in congestive heart failure. Continue Lasix, Aldactone, Lisinopril.  D/c home later today CONSULTS OBTAINED:  Treatment Team:  Lytle Butte, MD Manya Silvas, MD  DRUG ALLERGIES:   Allergies  Allergen Reactions   . Flagyl [Metronidazole] Anaphylaxis and Rash  . Penicillins Anaphylaxis and Other (See Comments)    Has patient had a PCN reaction causing immediate rash, facial/tongue/throat swelling, SOB or lightheadedness with hypotension: Yes Has patient had a PCN reaction causing severe rash involving mucus membranes or skin necrosis: No Has patient had a PCN reaction that required hospitalization No Has patient had a PCN reaction occurring within the last 10 years: No If all of the above answers are "NO", then may proceed with Cephalosporin use.  Marland Kitchen Doxycycline Hives    DISCHARGE MEDICATIONS:   Current Discharge Medication List    START taking these medications   Details  traMADol (ULTRAM) 50 MG tablet Take 1 tablet (50 mg total) by mouth every 8 (eight) hours as needed for moderate pain. Qty: 30 tablet, Refills: 0      CONTINUE these medications which have NOT CHANGED   Details  allopurinol (ZYLOPRIM) 300 MG tablet Take 300 mg by mouth at bedtime.  Refills: 0    ALPRAZolam (XANAX) 0.25 MG tablet Take 0.25 mg by mouth 2 (two) times daily as needed for anxiety.    carisoprodol (SOMA) 350 MG tablet Take 350 mg by mouth 2 (two) times daily as needed for muscle spasms.    escitalopram (LEXAPRO) 10 MG tablet Take 10 mg by mouth at bedtime.    furosemide (LASIX) 40 MG tablet Take 40 mg by mouth daily.  Refills: 0    glimepiride (AMARYL) 1 MG tablet Take 1 mg by mouth daily with breakfast.  Refills: 0    lansoprazole (PREVACID) 30 MG capsule Take 30 mg by mouth 2 (two) times daily.  Refills: 0    lisinopril (PRINIVIL,ZESTRIL) 10 MG tablet Take 10  mg by mouth at bedtime.     metoprolol succinate (TOPROL-XL) 50 MG 24 hr tablet Take 50 mg by mouth at bedtime. Take with or immediately following a meal.    montelukast (SINGULAIR) 10 MG tablet Take 10 mg by mouth daily.    sitaGLIPtin-metformin (JANUMET) 50-1000 MG tablet Take 1 tablet by mouth 2 (two) times daily with a meal.     spironolactone (ALDACTONE) 50 MG tablet Take 50 mg by mouth daily. Refills: 0      STOP taking these medications     HYDROcodone-acetaminophen (NORCO/VICODIN) 5-325 MG per tablet      cyclobenzaprine (FLEXERIL) 10 MG tablet         If you experience worsening of your admission symptoms, develop shortness of breath, life threatening emergency, suicidal or homicidal thoughts you must seek medical attention immediately by calling 911 or calling your MD immediately  if symptoms less severe.  You Must read complete instructions/literature along with all the possible adverse reactions/side effects for all the Medicines you take and that have been prescribed to you. Take any new Medicines after you have completely understood and accept all the possible adverse reactions/side effects.   Please note  You were cared for by a hospitalist during your hospital stay. If you have any questions about your discharge medications or the care you received while you were in the hospital after you are discharged, you can call the unit and asked to speak with the hospitalist on call if the hospitalist that took care of you is not available. Once you are discharged, your primary care physician will handle any further medical issues. Please note that NO REFILLS for any discharge medications will be authorized once you are discharged, as it is imperative that you return to your primary care physician (or establish a relationship with a primary care physician if you do not have one) for your aftercare needs so that they can reassess your need for medications and monitor your lab values. Today   SUBJECTIVE   Doing ok  VITAL SIGNS:  Blood pressure 106/37, pulse 81, temperature 98.2 F (36.8 C), temperature source Oral, resp. rate 18, height 5\' 4"  (1.626 m), weight 110.904 kg (244 lb 8 oz), SpO2 95 %.  I/O:   Intake/Output Summary (Last 24 hours) at 11/04/15 1058 Last data filed at 11/04/15 0725  Gross per 24  hour  Intake    854 ml  Output   1950 ml  Net  -1096 ml    PHYSICAL EXAMINATION:  GENERAL:  60 y.o.-year-old patient lying in the bed with no acute distress.  EYES: Pupils equal, round, reactive to light and accommodation. No scleral icterus. Extraocular muscles intact.  HEENT: Head atraumatic, normocephalic. Oropharynx and nasopharynx clear.  NECK:  Supple, no jugular venous distention. No thyroid enlargement, no tenderness.  LUNGS: Normal breath sounds bilaterally, no wheezing, rales,rhonchi or crepitation. No use of accessory muscles of respiration.  CARDIOVASCULAR: S1, S2 normal. No murmurs, rubs, or gallops.  ABDOMEN: Soft, non-tender, non-distended. Bowel sounds present. No organomegaly or mass.  EXTREMITIES: No pedal edema, cyanosis, or clubbing.  NEUROLOGIC: Cranial nerves II through XII are intact. Muscle strength 5/5 in all extremities. Sensation intact. Gait not checked.  PSYCHIATRIC: The patient is alert and oriented x 3.  SKIN: No obvious rash, lesion, or ulcer.   DATA REVIEW:   CBC   Recent Labs Lab 11/02/15 1654  WBC 12.8*  HGB 11.9*  HCT 34.3*  PLT 287    Chemistries  Recent Labs Lab 11/01/15 1433  NA 137  K 3.4*  CL 104  CO2 23  GLUCOSE 108*  BUN 22*  CREATININE 1.02*  CALCIUM 9.3  AST 25  ALT 26  ALKPHOS 85  BILITOT 0.3    Microbiology Results   No results found for this or any previous visit (from the past 240 hour(s)).  RADIOLOGY:  No results found.   Management plans discussed with the patient, family and they are in agreement.  CODE STATUS:     Code Status Orders        Start     Ordered   11/01/15 1549  Full code   Continuous     11/01/15 1548    Code Status History    Date Active Date Inactive Code Status Order ID Comments User Context   This patient has a current code status but no historical code status.      TOTAL TIME TAKING CARE OF THIS PATIENT: 40 minutes.    Odyn Turko M.D on 11/04/2015 at 10:58  AM  Between 7am to 6pm - Pager - [phone] After 6pm go to [email or web address] - password [email or web address]  Office  [phone]  CC: Primary care physician; Lavera Guise, MD

## 2015-11-04 NOTE — Progress Notes (Signed)
Per Dr. Posey Pronto okay to place patient on a Soft diet.

## 2015-11-04 NOTE — Care Management (Signed)
Patient placed in observation 6/23 for abd pain.  Colonoscopy today was negative.  For discharge home. No discharge needs identified by care team members.

## 2015-11-04 NOTE — Anesthesia Preprocedure Evaluation (Signed)
Anesthesia Evaluation  Patient identified by MRN, date of birth, ID band Patient awake    Reviewed: Allergy & Precautions, NPO status , Patient's Chart, lab work & pertinent test results, reviewed documented beta blocker date and time   History of Anesthesia Complications Negative for: history of anesthetic complications  Airway Mallampati: II  TM Distance: <3 FB     Dental  (+) Teeth Intact   Pulmonary COPD,  COPD inhaler, former smoker,    Pulmonary exam normal        Cardiovascular hypertension, Pt. on medications and Pt. on home beta blockers Normal cardiovascular exam     Neuro/Psych negative neurological ROS  negative psych ROS   GI/Hepatic Neg liver ROS, GERD  Medicated and Controlled,Abdominal pain   Endo/Other  diabetes, Well Controlled, Type 2, Oral Hypoglycemic Agents  Renal/GU negative Renal ROS  negative genitourinary   Musculoskeletal   Abdominal Some intermittant abdominal pain  Peds negative pediatric ROS (+)  Hematology   Anesthesia Other Findings   Reproductive/Obstetrics                             Anesthesia Physical  Anesthesia Plan  ASA: III  Anesthesia Plan: General   Post-op Pain Management:    Induction: Intravenous  Airway Management Planned: Nasal Cannula  Additional Equipment:   Intra-op Plan:   Post-operative Plan:   Informed Consent: I have reviewed the patients History and Physical, chart, labs and discussed the procedure including the risks, benefits and alternatives for the proposed anesthesia with the patient or authorized representative who has indicated his/her understanding and acceptance.     Plan Discussed with: Anesthesiologist, CRNA and Surgeon  Anesthesia Plan Comments:         Anesthesia Quick Evaluation

## 2015-11-04 NOTE — Progress Notes (Signed)
Per nurse in Endo Dr. Vira Agar wants pt to have colonoscopy so place order to obtain consent

## 2015-11-04 NOTE — Progress Notes (Signed)
Pt A and O x 4. VSS. Pt tolerating diet well. No complaints of pain or nausea. IV removed intact, prescriptions given. Pt voiced understanding of discharge instructions with no further questions. Pt discharged via wheelchair with axillary.   

## 2015-11-04 NOTE — Op Note (Signed)
[facility] Gastroenterology Patient Name: Wendy Hubbard Procedure Date: 11/04/2015 9:21 AM MRN: [id number] Account #: [id number] Date of Birth: 1956-01-08 Admit Type: Inpatient Age: 60 Room: [facility] ENDO ROOM 1 Gender: Female Note Status: Finalized Procedure:            Colonoscopy Indications:          Abdominal pain in the left lower quadrant, Abdominal                        pain in the right upper quadrant Providers:            Manya Silvas, MD Referring MD:         Lavera Guise, MD (Referring MD) Medicines:            Propofol per Anesthesia Complications:        No immediate complications. Procedure:            Pre-Anesthesia Assessment:                       - After reviewing the risks and benefits, the patient                        was deemed in satisfactory condition to undergo the                        procedure.                       After obtaining informed consent, the colonoscope was                        passed under direct vision. Throughout the procedure,                        the patient's blood pressure, pulse, and oxygen                        saturations were monitored continuously. The                        Colonoscope was introduced through the anus and                        advanced to the the cecum, identified by appendiceal                        orifice and ileocecal valve. The colonoscopy was                        performed without difficulty. The patient tolerated the                        procedure well. The quality of the bowel preparation                        was good. Findings:      The entire examined colon appeared normal.      Internal hemorrhoids were found during endoscopy. The hemorrhoids were       small and Grade I (internal hemorrhoids that do not prolapse).      The exam was  otherwise without abnormality. Impression:           - The entire examined colon is normal.                       - Internal  hemorrhoids.                       - The examination was otherwise normal.                       - No specimens collected. Recommendation:       - The findings and recommendations were discussed with                        the patient's family. Manya Silvas, MD 11/04/2015 9:44:21 AM This report has been signed electronically. Number of Addenda: 0 Note Initiated On: 11/04/2015 9:21 AM Scope Withdrawal Time: 0 hours 7 minutes 3 seconds  Total Procedure Duration: 0 hours 9 minutes 42 seconds       [facility]

## 2015-11-04 NOTE — Transfer of Care (Signed)
Immediate Anesthesia Transfer of Care Note  Patient: Wendy Hubbard  Procedure(s) Performed: Procedure(s): COLONOSCOPY (N/A)  Patient Location: PACU  Anesthesia Type:MAC  Level of Consciousness: awake  Airway & Oxygen Therapy: Patient Spontanous Breathing  Post-op Assessment: Report given to RN  Post vital signs: stable  Last Vitals:  Filed Vitals:   11/04/15 0423 11/04/15 0914  BP: 116/43 105/38  Pulse: 76 80  Temp: 37.2 C 36.7 C  Resp: 16     Last Pain:  Filed Vitals:   11/04/15 0916  PainSc: 8       Patients Stated Pain Goal: 0 ([id number])  Complications: No apparent anesthesia complications

## 2015-11-04 NOTE — Discharge Instructions (Signed)

## 2015-11-04 NOTE — Consult Note (Signed)
Patient colonoscopy was normal except for internal hemorrhoids, with WBC down, normal CT and EGD and colonoscopy her pain is likely due to adhesions as she has had multiple intraabdominal operations and laproscopic exams.  Could likely go home on mild -moderate pain medication.

## 2015-11-05 ENCOUNTER — Encounter: Payer: Self-pay | Admitting: Unknown Physician Specialty

## 2015-11-05 NOTE — Anesthesia Postprocedure Evaluation (Signed)
Anesthesia Post Note  Patient: Wendy Hubbard  Procedure(s) Performed: Procedure(s) (LRB): COLONOSCOPY (N/A)  Patient location during evaluation: PACU Anesthesia Type: General Level of consciousness: awake and alert and oriented Pain management: pain level controlled Vital Signs Assessment: post-procedure vital signs reviewed and stable Respiratory status: spontaneous breathing Cardiovascular status: blood pressure returned to baseline Anesthetic complications: no    Last Vitals:  Filed Vitals:   11/04/15 1015 11/04/15 1042  BP: 104/48 106/37  Pulse: 82 81  Temp:  36.8 C  Resp: 22 18    Last Pain:  Filed Vitals:   11/04/15 1121  PainSc: 5                  Kaitlynn Tramontana

## 2015-11-05 NOTE — Anesthesia Postprocedure Evaluation (Signed)
Anesthesia Post Note  Patient: Wendy Hubbard  Procedure(s) Performed: Procedure(s) (LRB): ESOPHAGOGASTRODUODENOSCOPY (EGD) (N/A)  Patient location during evaluation: PACU Anesthesia Type: General Level of consciousness: awake and alert Pain management: pain level controlled Vital Signs Assessment: post-procedure vital signs reviewed and stable Respiratory status: spontaneous breathing, nonlabored ventilation, respiratory function stable and patient connected to nasal cannula oxygen Cardiovascular status: blood pressure returned to baseline and stable Postop Assessment: no signs of nausea or vomiting Anesthetic complications: no    Last Vitals:  Filed Vitals:   11/04/15 1015 11/04/15 1042  BP: 104/48 106/37  Pulse: 82 81  Temp:  36.8 C  Resp: 22 18    Last Pain:  Filed Vitals:   11/04/15 1121  PainSc: 5                  Molli Barrows

## 2015-11-06 LAB — SURGICAL PATHOLOGY

## 2015-11-18 DIAGNOSIS — E538 Deficiency of other specified B group vitamins: Secondary | ICD-10-CM | POA: Insufficient documentation

## 2015-11-26 ENCOUNTER — Encounter: Payer: Self-pay | Admitting: Urology

## 2015-11-26 ENCOUNTER — Ambulatory Visit (INDEPENDENT_AMBULATORY_CARE_PROVIDER_SITE_OTHER): Payer: BLUE CROSS/BLUE SHIELD | Admitting: Urology

## 2015-11-26 VITALS — BP 111/71 | HR 98 | Ht 64.0 in | Wt 243.0 lb

## 2015-11-26 DIAGNOSIS — Q6100 Congenital renal cyst, unspecified: Secondary | ICD-10-CM

## 2015-11-26 DIAGNOSIS — N281 Cyst of kidney, acquired: Secondary | ICD-10-CM

## 2015-11-26 DIAGNOSIS — R1032 Left lower quadrant pain: Secondary | ICD-10-CM

## 2015-11-26 DIAGNOSIS — N393 Stress incontinence (female) (male): Secondary | ICD-10-CM | POA: Diagnosis not present

## 2015-11-26 LAB — URINALYSIS, COMPLETE
Bilirubin, UA: NEGATIVE
Glucose, UA: NEGATIVE
Ketones, UA: NEGATIVE
Leukocytes, UA: NEGATIVE
Nitrite, UA: NEGATIVE
Protein, UA: NEGATIVE
Specific Gravity, UA: 1.015 (ref 1.005–1.030)
Urobilinogen, Ur: 0.2 mg/dL (ref 0.2–1.0)
pH, UA: 5.5 (ref 5.0–7.5)

## 2015-11-26 LAB — MICROSCOPIC EXAMINATION: Bacteria, UA: NONE SEEN

## 2015-11-26 NOTE — Progress Notes (Signed)
11/26/2015 3:26 PM   Wendy Hubbard 01/30/1956 [id number]:[id number]  Referring provider: Marval Regal, NP [place], [place] [address]  Chief Complaint  Patient presents with  . Renal Cyst    New Patient    HPI: Wendy Hubbard is a pleasant 60-year-old female who presents today for further evaluation of bilateral renal cyst incidentally identified on CT abdomen pelvis. She's had 2 CT scans, 10/23/2015 and 11/01/2015 for further evaluation of severe intermittent left lower quadrant pain. She was in fact admitted on 1 occasion due to the severity of the pain. No clear etiology has been identified.  She's had an extensive workup including colonoscopy all of which are negative.  Status post hysterectomy, BSO.    On CT abdomen and pelvis with contrast 2, she's had incidental hypoechoic low attenuating renal lesions consistent with simple) measuring up to 13 mm on the left and a smaller on the right. The kidneys are otherwise unremarkable. There are no other renal abnormalities, stones, or any other concerning findings in her GU system on CT scan.  Denies any flank pain or gross hematuria.  No UTIs.    She denies any significant urinary symptoms other than some mild stress incontinence.  She is S/p midurethral sling placement many years ago at [facility].  She was initially dry but developed worsening incontinence over the past year or so.  Weight has been stable.  She does think that several years ago she "passed" her sling while voiding which she described is a whitish glob and ever since then, she has had recurrence of her mild leakage. She does do rare Kegels a few times a week.    She is considering moving forward with bariatric surgery in the near future.   PMH: Past Medical History  Diagnosis Date  . Diabetes mellitus without complication ([place])   . COPD (chronic obstructive pulmonary disease) ([place])   . Hypertension   . Hyperlipidemia   . GERD  (gastroesophageal reflux disease)   . Rapid heart rate     Surgical History: Past Surgical History  Procedure Laterality Date  . Tonsillectomy    . Abdominal hysterectomy    . Breast surgery    . Cholecystectomy    . Appendectomy    . Ankle arthroscopy Bilateral   . Knee arthroscopy Left   . Bladder suspension    . Esophagogastroduodenoscopy N/A 11/02/2015    Procedure: ESOPHAGOGASTRODUODENOSCOPY (EGD);  Surgeon: Manya Silvas, MD;  Location: [facility] ENDOSCOPY;  Service: Endoscopy;  Laterality: N/A;  . Colonoscopy N/A 11/04/2015    Procedure: COLONOSCOPY;  Surgeon: Manya Silvas, MD;  Location: [facility] ENDOSCOPY;  Service: Endoscopy;  Laterality: N/A;    Home Medications:    Medication List       This list is accurate as of: 11/26/15  3:26 PM.  Always use your most recent med list.               cetirizine 10 MG tablet  Commonly known as:  ZYRTEC  Take 10 mg by mouth daily.     furosemide 40 MG tablet  Commonly known as:  LASIX  Take 40 mg by mouth daily.     gabapentin 300 MG capsule  Commonly known as:  NEURONTIN     glimepiride 1 MG tablet  Commonly known as:  AMARYL  Take 1 mg by mouth daily with breakfast.     lansoprazole 30 MG capsule  Commonly known as:  PREVACID  Take 30 mg by mouth 2 (two) times daily.     metoprolol succinate 50 MG 24 hr tablet  Commonly known as:  TOPROL-XL  Take 50 mg by mouth at bedtime. Take with or immediately following a meal.     montelukast 10 MG tablet  Commonly known as:  SINGULAIR  Take 10 mg by mouth daily.     spironolactone 50 MG tablet  Commonly known as:  ALDACTONE  Take 50 mg by mouth daily.     TRULICITY [id number] Sopn  Generic drug:  Dulaglutide  Inject into the skin.        Allergies:  Allergies  Allergen Reactions  . Flagyl [Metronidazole] Anaphylaxis and Rash  . Penicillins Anaphylaxis and Other (See Comments)    Has patient had a PCN reaction causing immediate rash, facial/tongue/throat  swelling, SOB or lightheadedness with hypotension: Yes Has patient had a PCN reaction causing severe rash involving mucus membranes or skin necrosis: No Has patient had a PCN reaction that required hospitalization No Has patient had a PCN reaction occurring within the last 10 years: No If all of the above answers are "NO", then may proceed with Cephalosporin use.  Marland Kitchen Doxycycline Hives    Family History: Family History  Problem Relation Age of Onset  . Hypertension Other   . Bladder Cancer Mother   . Kidney cancer Neg Hx   . Prostate cancer Neg Hx     Social History:  reports that she has quit smoking. She does not have any smokeless tobacco history on file. She reports that she drinks alcohol. She reports that she does not use illicit drugs.  ROS: UROLOGY Frequent Urination?: No Hard to postpone urination?: Yes Burning/pain with urination?: No Get up at night to urinate?: Yes Leakage of urine?: Yes Urine stream starts and stops?: No Trouble starting stream?: No Do you have to strain to urinate?: No Blood in urine?: No Urinary tract infection?: No Sexually transmitted disease?: No Injury to kidneys or bladder?: No Painful intercourse?: No Weak stream?: No Currently pregnant?: No Vaginal bleeding?: No Last menstrual period?: hysterectomy  Gastrointestinal Nausea?: No Vomiting?: No Indigestion/heartburn?: Yes Diarrhea?: No Constipation?: No  Constitutional Fever: No Night sweats?: No Weight loss?: No Fatigue?: Yes  Skin Skin rash/lesions?: No Itching?: No  Eyes Blurred vision?: No Double vision?: No  Ears/Nose/Throat Sore throat?: No Sinus problems?: Yes  Hematologic/Lymphatic Swollen glands?: No Easy bruising?: Yes  Cardiovascular Leg swelling?: Yes Chest pain?: No  Respiratory Cough?: No Shortness of breath?: No  Endocrine Excessive thirst?: No  Musculoskeletal Back pain?: Yes Joint pain?: Yes  Neurological Headaches?: No Dizziness?:  No  Psychologic Depression?: No Anxiety?: No  Physical Exam: BP 111/71 mmHg  Pulse 98  Ht 5\' 4"  (1.626 m)  Wt 243 lb (110.224 kg)  BMI 41.69 kg/m2  Constitutional:  Alert and oriented, No acute distress. HEENT: [place] AT, moist mucus membranes.  Trachea midline, no masses. Cardiovascular: No clubbing, cyanosis, or edema. Respiratory: Normal respiratory effort, no increased work of breathing. GI: Abdomen is soft, nontender, nondistended, no abdominal masses, obese.   GU: No CVA tenderness. Skin: No rashes, bruises or suspicious lesions. Lymph: No cervical or inguinal adenopathy. Neurologic: Grossly intact, no focal deficits, moving all 4 extremities. Psychiatric: Normal mood and affect.  Laboratory Data: Lab Results  Component Value Date   WBC 12.8* 11/02/2015   HGB 11.9* 11/02/2015   HCT 34.3* 11/02/2015   MCV 95.5 11/02/2015   PLT 287 11/02/2015  Lab Results  Component Value Date   CREATININE 1.02* 11/01/2015    Lab Results  Component Value Date   HGBA1C 6.7* 11/01/2015    Urinalysis    Component Value Date/Time   COLORURINE STRAW* 11/01/2015 1433   APPEARANCEUR CLEAR* 11/01/2015 1433   LABSPEC 1.006 11/01/2015 1433   PHURINE 5.0 11/01/2015 1433   GLUCOSEU NEGATIVE 11/01/2015 1433   HGBUR 1+* 11/01/2015 1433   BILIRUBINUR NEGATIVE 11/01/2015 1433   KETONESUR NEGATIVE 11/01/2015 1433   PROTEINUR NEGATIVE 11/01/2015 1433   NITRITE NEGATIVE 11/01/2015 1433   LEUKOCYTESUR NEGATIVE 11/01/2015 1433    Pertinent Imaging: Study Result     CLINICAL DATA: Severe sharp LEFT upper quadrant pain for 24 hours, nausea, diarrhea, RIGHT-side abdominal pain, leukocytosis; prior appendectomy, hysterectomy, cholecystectomy, bladder suspension; diabetes mellitus, hypertension, COPD, GERD  EXAM: CT ABDOMEN AND PELVIS WITH CONTRAST  TECHNIQUE: Multidetector CT imaging of the abdomen and pelvis was performed using the standard protocol following bolus administration  of intravenous contrast. Sagittal and coronal MPR images reconstructed from axial data set.  CONTRAST: 160mL ISOVUE-300 IOPAMIDOL (ISOVUE-300) INJECTION 61% IV. Dilute oral contrast.  COMPARISON: 10/23/2015  FINDINGS: Lower chest: Lung bases clear.  Hepatobiliary: Diffuse fatty infiltration of the liver. Gallbladder surgically absent. No biliary dilatation.  Pancreas: Normal appearance  Spleen: Normal appearance  Adrenals/Urinary Tract: Small LEFT renal cyst. Otherwise normal appearing kidneys without mass or hydronephrosis. Normal appearing adrenal glands. Single BILATERAL unremarkable ureters. Normal bladder.  Stomach/Bowel: Appendix surgically absent by history. Stomach and bowel loops normal appearance.  Vascular/Lymphatic: Atherosclerotic calcifications aorta. No adenopathy.  Reproductive: Uterus surgically absent. RIGHT ovary not visualized. LEFT ovary normal appearance.  Other: No mass, free air, free fluid, or hernia.  Musculoskeletal: No acute osseous findings.  IMPRESSION: Diffuse fatty infiltration of liver.  Small LEFT renal cyst.  Aortic atherosclerosis.  Otherwise negative exam.   Electronically Signed  By: Lavonia Dana M.D.  On: 11/01/2015 16:29   CT scan personally reviewed today and with the patient  Assessment & Plan:    1. Renal cyst Incidental bilateral renal cysts on CT scan.  I explained that this particular scan is not perfectly protocol to evaluate renal masses and given the small size, they're somewhat difficult to characterize but all features are consistent with benign uncomplicated renal cyst.  She mentioned that her primary care will be following up with a renal ultrasound in a few months which seems reasonable. I do not feel that she needs any further workup at this time beyond this.  We did personally review the CT scan together and she was reassured.  - Urinalysis, Complete  2. Left lower quadrant  pain Left lower quadrant pain of unclear etiology, status post extensive workup. I explained that her pain is not secondary to these incidental renal findings on CT scan.  3. SUI (stress urinary incontinence, female) S/p sling Mild recurrent stress urinary incontinence As primary intervention at this point, I would consider more frequent KEgel exercises with possible pelvic floor therapy combined with weight loss     No Follow-up on file.  Hollice Espy, MD  [facility] [address], [place], [place] [address] [phone]

## 2015-12-05 DIAGNOSIS — E1169 Type 2 diabetes mellitus with other specified complication: Secondary | ICD-10-CM | POA: Insufficient documentation

## 2015-12-05 DIAGNOSIS — I10 Essential (primary) hypertension: Secondary | ICD-10-CM | POA: Insufficient documentation

## 2015-12-05 DIAGNOSIS — E782 Mixed hyperlipidemia: Secondary | ICD-10-CM | POA: Insufficient documentation

## 2015-12-05 DIAGNOSIS — R0602 Shortness of breath: Secondary | ICD-10-CM | POA: Insufficient documentation

## 2016-02-03 ENCOUNTER — Other Ambulatory Visit: Payer: Self-pay | Admitting: Internal Medicine

## 2016-02-03 DIAGNOSIS — Z1231 Encounter for screening mammogram for malignant neoplasm of breast: Secondary | ICD-10-CM

## 2016-02-18 ENCOUNTER — Ambulatory Visit
Admission: RE | Admit: 2016-02-18 | Discharge: 2016-02-18 | Disposition: A | Discharge status: Home / Self Care | Payer: BLUE CROSS/BLUE SHIELD | Source: Ambulatory Visit | Attending: Internal Medicine | Admitting: Internal Medicine

## 2016-02-18 DIAGNOSIS — Z1231 Encounter for screening mammogram for malignant neoplasm of breast: Secondary | ICD-10-CM | POA: Insufficient documentation

## 2016-08-05 DIAGNOSIS — G4733 Obstructive sleep apnea (adult) (pediatric): Secondary | ICD-10-CM | POA: Diagnosis not present

## 2016-08-05 DIAGNOSIS — M792 Neuralgia and neuritis, unspecified: Secondary | ICD-10-CM | POA: Diagnosis not present

## 2016-08-05 DIAGNOSIS — R7301 Impaired fasting glucose: Secondary | ICD-10-CM | POA: Diagnosis not present

## 2016-08-27 DIAGNOSIS — R05 Cough: Secondary | ICD-10-CM | POA: Diagnosis not present

## 2016-08-27 DIAGNOSIS — G4733 Obstructive sleep apnea (adult) (pediatric): Secondary | ICD-10-CM | POA: Diagnosis not present

## 2016-08-27 DIAGNOSIS — Z9884 Bariatric surgery status: Secondary | ICD-10-CM | POA: Diagnosis not present

## 2016-11-25 DIAGNOSIS — Z634 Disappearance and death of family member: Secondary | ICD-10-CM | POA: Diagnosis not present

## 2016-11-25 DIAGNOSIS — E1140 Type 2 diabetes mellitus with diabetic neuropathy, unspecified: Secondary | ICD-10-CM | POA: Diagnosis not present

## 2016-11-25 DIAGNOSIS — M79673 Pain in unspecified foot: Secondary | ICD-10-CM | POA: Diagnosis not present

## 2017-01-05 DIAGNOSIS — R1114 Bilious vomiting: Secondary | ICD-10-CM | POA: Diagnosis not present

## 2017-01-05 DIAGNOSIS — Z9884 Bariatric surgery status: Secondary | ICD-10-CM | POA: Insufficient documentation

## 2017-01-29 DIAGNOSIS — Z9884 Bariatric surgery status: Secondary | ICD-10-CM | POA: Diagnosis not present

## 2017-01-29 DIAGNOSIS — R197 Diarrhea, unspecified: Secondary | ICD-10-CM | POA: Diagnosis not present

## 2017-01-29 DIAGNOSIS — K210 Gastro-esophageal reflux disease with esophagitis: Secondary | ICD-10-CM | POA: Diagnosis not present

## 2017-01-29 DIAGNOSIS — R1319 Other dysphagia: Secondary | ICD-10-CM | POA: Diagnosis not present

## 2017-03-02 DIAGNOSIS — K219 Gastro-esophageal reflux disease without esophagitis: Secondary | ICD-10-CM | POA: Diagnosis not present

## 2017-03-02 DIAGNOSIS — R1114 Bilious vomiting: Secondary | ICD-10-CM | POA: Diagnosis not present

## 2017-03-02 DIAGNOSIS — Z9884 Bariatric surgery status: Secondary | ICD-10-CM | POA: Diagnosis not present

## 2017-03-02 DIAGNOSIS — J301 Allergic rhinitis due to pollen: Secondary | ICD-10-CM | POA: Diagnosis not present

## 2017-03-02 DIAGNOSIS — G4733 Obstructive sleep apnea (adult) (pediatric): Secondary | ICD-10-CM | POA: Diagnosis not present

## 2017-03-04 DIAGNOSIS — K449 Diaphragmatic hernia without obstruction or gangrene: Secondary | ICD-10-CM | POA: Diagnosis not present

## 2017-03-04 DIAGNOSIS — K2950 Unspecified chronic gastritis without bleeding: Secondary | ICD-10-CM | POA: Diagnosis not present

## 2017-03-04 DIAGNOSIS — K2970 Gastritis, unspecified, without bleeding: Secondary | ICD-10-CM | POA: Diagnosis not present

## 2017-03-04 DIAGNOSIS — K219 Gastro-esophageal reflux disease without esophagitis: Secondary | ICD-10-CM | POA: Diagnosis not present

## 2017-03-08 DIAGNOSIS — E119 Type 2 diabetes mellitus without complications: Secondary | ICD-10-CM | POA: Diagnosis not present

## 2017-03-08 DIAGNOSIS — K219 Gastro-esophageal reflux disease without esophagitis: Secondary | ICD-10-CM | POA: Diagnosis not present

## 2017-03-08 DIAGNOSIS — K660 Peritoneal adhesions (postprocedural) (postinfection): Secondary | ICD-10-CM | POA: Diagnosis not present

## 2017-03-08 DIAGNOSIS — K449 Diaphragmatic hernia without obstruction or gangrene: Secondary | ICD-10-CM | POA: Diagnosis not present

## 2017-03-08 DIAGNOSIS — Z9884 Bariatric surgery status: Secondary | ICD-10-CM | POA: Diagnosis not present

## 2017-03-25 DIAGNOSIS — E1140 Type 2 diabetes mellitus with diabetic neuropathy, unspecified: Secondary | ICD-10-CM | POA: Diagnosis not present

## 2017-03-25 DIAGNOSIS — M79673 Pain in unspecified foot: Secondary | ICD-10-CM | POA: Diagnosis not present

## 2017-03-25 DIAGNOSIS — Z9884 Bariatric surgery status: Secondary | ICD-10-CM | POA: Diagnosis not present

## 2017-05-10 DIAGNOSIS — M1990 Unspecified osteoarthritis, unspecified site: Secondary | ICD-10-CM | POA: Diagnosis not present

## 2017-05-10 DIAGNOSIS — K219 Gastro-esophageal reflux disease without esophagitis: Secondary | ICD-10-CM | POA: Diagnosis not present

## 2017-05-10 DIAGNOSIS — K660 Peritoneal adhesions (postprocedural) (postinfection): Secondary | ICD-10-CM | POA: Diagnosis not present

## 2017-06-04 ENCOUNTER — Other Ambulatory Visit: Payer: Self-pay

## 2017-06-04 MED ORDER — TIZANIDINE HCL 4 MG PO TABS: ORAL_TABLET | ORAL | 1 refills | Status: DC

## 2017-06-21 ENCOUNTER — Other Ambulatory Visit: Payer: Self-pay | Admitting: Nurse Practitioner

## 2017-06-21 ENCOUNTER — Other Ambulatory Visit: Payer: Self-pay

## 2017-06-21 DIAGNOSIS — M79671 Pain in right foot: Secondary | ICD-10-CM

## 2017-06-21 DIAGNOSIS — M79672 Pain in left foot: Secondary | ICD-10-CM

## 2017-06-21 MED ORDER — HYDROCODONE-ACETAMINOPHEN 5-325 MG PO TABS
1.0000 | ORAL_TABLET | Freq: Four times a day (QID) | ORAL | 0 refills | Status: DC | PRN
Start: 2017-06-21 — End: 2017-07-30

## 2017-06-21 MED ORDER — ESCITALOPRAM OXALATE 10 MG PO TABS: 10.0000 mg | ORAL_TABLET | Freq: Every evening | ORAL | 0 refills | Status: DC

## 2017-06-21 MED ORDER — METOPROLOL SUCCINATE ER 50 MG PO TB24: 50.0000 mg | ORAL_TABLET | Freq: Every day | ORAL | 1 refills | Status: DC

## 2017-06-21 MED ORDER — GABAPENTIN 300 MG PO CAPS: 300.0000 mg | ORAL_CAPSULE | Freq: Three times a day (TID) | ORAL | 1 refills | Status: DC

## 2017-06-21 MED ORDER — LANSOPRAZOLE 30 MG PO CPDR: 30.0000 mg | DELAYED_RELEASE_CAPSULE | Freq: Two times a day (BID) | ORAL | 3 refills | Status: DC

## 2017-06-23 ENCOUNTER — Telehealth: Payer: Self-pay

## 2017-06-23 NOTE — Telephone Encounter (Signed)
-----   Message from Ronnell Freshwater, NP sent at 06/21/2017  5:09 PM EST ----- Regarding: RE: rx refill I took care of this for her ----- Message ----- From: Waynard Edwards Sent: 06/21/2017   4:08 PM To: Ronnell Freshwater, NP Subject: rx refill                                      Pt needs her hydrocodone sent in.

## 2017-06-23 NOTE — Telephone Encounter (Signed)
Pt was called.

## 2017-07-19 ENCOUNTER — Encounter: Payer: Self-pay | Admitting: Internal Medicine

## 2017-07-19 ENCOUNTER — Ambulatory Visit (INDEPENDENT_AMBULATORY_CARE_PROVIDER_SITE_OTHER): Payer: 59 | Admitting: Internal Medicine

## 2017-07-19 VITALS — BP 129/76 | HR 85 | Temp 98.0°F | Resp 16 | Ht 64.0 in | Wt 187.6 lb

## 2017-07-19 DIAGNOSIS — J208 Acute bronchitis due to other specified organisms: Secondary | ICD-10-CM | POA: Diagnosis not present

## 2017-07-19 DIAGNOSIS — R509 Fever, unspecified: Secondary | ICD-10-CM

## 2017-07-19 DIAGNOSIS — R059 Cough, unspecified: Secondary | ICD-10-CM

## 2017-07-19 DIAGNOSIS — R05 Cough: Secondary | ICD-10-CM | POA: Diagnosis not present

## 2017-07-19 DIAGNOSIS — R6889 Other general symptoms and signs: Secondary | ICD-10-CM

## 2017-07-19 LAB — POCT RAPID STREP A (OFFICE): Rapid Strep A Screen: NEGATIVE

## 2017-07-19 LAB — POCT INFLUENZA A/B
Influenza A, POC: NEGATIVE
Influenza B, POC: NEGATIVE

## 2017-07-19 MED ORDER — HYDROCOD POLST-CPM POLST ER 10-8 MG/5ML PO SUER: 5.0000 mL | Freq: Two times a day (BID) | ORAL | 0 refills | Status: DC

## 2017-07-19 MED ORDER — HYDROCOD POLST-CPM POLST ER 10-8 MG/5ML PO SUER: 5.0000 mL | Freq: Two times a day (BID) | ORAL | 0 refills | Status: AC

## 2017-07-19 MED ORDER — LEVOFLOXACIN 500 MG PO TABS: 500.0000 mg | ORAL_TABLET | Freq: Every day | ORAL | 0 refills | Status: DC

## 2017-07-19 NOTE — Patient Instructions (Signed)

## 2017-07-19 NOTE — Progress Notes (Signed)
[facility] [place], [place] [address]  Pulmonary Sleep Medicine  Office Visit Note  Patient Name: Wendy Hubbard DOB: 1955-10-13 MRN [id number]  Date of Service: 07/19/2017  Complaints/HPI:  Patient states that she has been sick having cough congestion.  She has not had any nasal drip.  She says that she has also lost her voice.  She says she was exposed to somebody was sick.  Denies having any hemoptysis.  She has had a low-grade temperature noted.  Feels the cough is little bit deep down.  ROS  General: + fever, (-) chills, (-) night sweats, (-) weakness Skin: (-) rashes, (-) itching,. Eyes: (-) visual changes, (-) redness, (-) itching. Nose and Sinuses: - nasal stuffiness or itchiness, (-) postnasal drip, (-) nosebleeds, (-) sinus trouble. Mouth and Throat: (-) sore throat, + hoarseness. Neck: (-) swollen glands, (-) enlarged thyroid, (-) neck pain. Respiratory: + cough, (-) bloody sputum, + shortness of breath, + wheezing. Cardiovascular: - ankle swelling, (-) chest pain. Lymphatic: (-) lymph node enlargement. Neurologic: (-) numbness, (-) tingling. Psychiatric: (-) anxiety, (-) depression   Current Medication: Outpatient Encounter Medications as of 07/19/2017  Medication Sig Note  . cetirizine (ZYRTEC) 10 MG tablet Take 10 mg by mouth daily.   Marland Kitchen escitalopram (LEXAPRO) 10 MG tablet Take 1 tablet (10 mg total) by mouth every evening.   . gabapentin (NEURONTIN) 300 MG capsule Take 1 capsule (300 mg total) by mouth 3 (three) times daily.   Marland Kitchen HYDROcodone-acetaminophen (NORCO/VICODIN) 5-325 MG tablet Take 1 tablet by mouth every 6 (six) hours as needed for moderate pain.   . metoprolol succinate (TOPROL-XL) 50 MG 24 hr tablet Take 1 tablet (50 mg total) by mouth at bedtime. Take with or immediately following a meal.   . tiZANidine (ZANAFLEX) 4 MG tablet Take 1 tablet at bedtime   . Dulaglutide (TRULICITY) 5.95 GL/8.7FI SOPN Inject into the skin.   .  furosemide (LASIX) 40 MG tablet Take 40 mg by mouth daily.  08/22/2014: .   . glimepiride (AMARYL) 1 MG tablet Take 1 mg by mouth daily with breakfast.  08/22/2014: .   . lansoprazole (PREVACID) 30 MG capsule Take 1 capsule (30 mg total) by mouth 2 (two) times daily. (Patient not taking: Reported on 07/19/2017)   . montelukast (SINGULAIR) 10 MG tablet Take 10 mg by mouth daily.   Marland Kitchen spironolactone (ALDACTONE) 50 MG tablet Take 50 mg by mouth daily. 08/22/2014: .    No facility-administered encounter medications on file as of 07/19/2017.     Surgical History: Past Surgical History:  Procedure Laterality Date  . ABDOMINAL HYSTERECTOMY    . ANKLE ARTHROSCOPY Bilateral   . APPENDECTOMY    . BLADDER SUSPENSION    . BREAST EXCISIONAL BIOPSY Left 1990   neg  . BREAST EXCISIONAL BIOPSY Right 1989   neg  . BREAST SURGERY    . CHOLECYSTECTOMY    . COLONOSCOPY N/A 11/04/2015   Procedure: COLONOSCOPY;  Surgeon: Manya Silvas, MD;  Location: [facility] ENDOSCOPY;  Service: Endoscopy;  Laterality: N/A;  . ESOPHAGOGASTRODUODENOSCOPY N/A 11/02/2015   Procedure: ESOPHAGOGASTRODUODENOSCOPY (EGD);  Surgeon: Manya Silvas, MD;  Location: [facility] ENDOSCOPY;  Service: Endoscopy;  Laterality: N/A;  . KNEE ARTHROSCOPY Left   . REDUCTION MAMMAPLASTY Bilateral 1988  . TONSILLECTOMY      Medical History: Past Medical History:  Diagnosis Date  . COPD (chronic obstructive pulmonary disease) ([place])   . Diabetes mellitus without complication ([place])   .  GERD (gastroesophageal reflux disease)   . Hyperlipidemia   . Hypertension   . Rapid heart rate   . Sleep apnea     Family History: Family History  Problem Relation Age of Onset  . Hypertension Other   . Bladder Cancer Mother   . Kidney cancer Neg Hx   . Prostate cancer Neg Hx     Social History: Social History   Socioeconomic History  . Marital status: Married    Spouse name: Not on file  . Number of children: Not on file  . Years of education: Not on  file  . Highest education level: Not on file  Social Needs  . Financial resource strain: Not on file  . Food insecurity - worry: Not on file  . Food insecurity - inability: Not on file  . Transportation needs - medical: Not on file  . Transportation needs - non-medical: Not on file  Occupational History  . Not on file  Tobacco Use  . Smoking status: Former Research scientist (life sciences)  . Smokeless tobacco: Never Used  Substance and Sexual Activity  . Alcohol use: Yes    Alcohol/week: 0.0 oz    Comment: occasional  . Drug use: No  . Sexual activity: Not on file  Other Topics Concern  . Not on file  Social History Narrative  . Not on file    Vital Signs: Blood pressure 129/76, pulse 85, temperature 98 F (36.7 C), resp. rate 16, height 5\' 4"  (1.626 m), weight 187 lb 9.6 oz (85.1 kg), SpO2 98 %.  Examination: General Appearance: The patient is well-developed, well-nourished, and in no distress. Skin: Gross inspection of skin unremarkable. Head: normocephalic, no gross deformities. Eyes: no gross deformities noted. ENT: ears appear grossly normal no exudates. Neck: Supple. No thyromegaly. No LAD. Respiratory:   No rhonchi or rales expansion is equal. Cardiovascular: Normal S1 and S2 without murmur or rub. Extremities: No cyanosis. pulses are equal. Neurologic: Alert and oriented. No involuntary movements.  LABS: No results found for this or any previous visit (from the past 2160 hour(s)).  Radiology: Mm Digital Screening Bilateral  Result Date: 02/18/2016 CLINICAL DATA:  Screening. EXAM: DIGITAL SCREENING BILATERAL MAMMOGRAM WITH CAD COMPARISON:  Previous exam(s). ACR Breast Density Category b: There are scattered areas of fibroglandular density. FINDINGS: There are no findings suspicious for malignancy. Images were processed with CAD. IMPRESSION: No mammographic evidence of malignancy. A result letter of this screening mammogram will be mailed directly to the patient. RECOMMENDATION: Screening  mammogram in one year. (Code:SM-B-01Y) BI-RADS CATEGORY  1: Negative. Electronically Signed   By: Ammie Ferrier M.D.   On: 02/18/2016 13:57    No results found.  No results found.    Assessment and Plan: Patient Active Problem List   Diagnosis Date Noted  . Gonalgia 01/22/2015  . Arthritis of knee, degenerative 10/04/2014    1.  acute bronchitis likely viral 2. Cough  3. Shortness of breath 4. Fever with flu-like symptoms   will check a the strep test now.  Also will check a flu test now.  Will get a chest x-ray and send her a prescription for Levaquin.  Her symptoms do not improve she needs to come back in CS here in the office or go to the emergency.  She was also given a prescription for Tussionex for the cough  General Counseling: I have discussed the findings of the evaluation and examination with [facility].  I have also discussed any further diagnostic evaluation thatmay be needed  or ordered today. Wendy Hubbard verbalizes understanding of the findings of todays visit. We also reviewed her medications today and discussed drug interactions and side effects including but not limited excessive drowsiness and altered mental states. We also discussed that there is always a risk not just to her but also people around her. she has been encouraged to call the office with any questions or concerns that should arise related to todays visit.    Time spent: 71min  I have personally obtained a history, examined the patient, evaluated laboratory and imaging results, formulated the assessment and plan and placed orders.    Allyne Gee, MD [facility] Pulmonary and Critical Care Sleep medicine

## 2017-07-27 ENCOUNTER — Ambulatory Visit: Payer: Self-pay | Admitting: Nurse Practitioner

## 2017-07-30 ENCOUNTER — Other Ambulatory Visit: Payer: Self-pay | Admitting: Nurse Practitioner

## 2017-07-30 ENCOUNTER — Telehealth: Payer: Self-pay

## 2017-07-30 DIAGNOSIS — M79671 Pain in right foot: Secondary | ICD-10-CM

## 2017-07-30 DIAGNOSIS — M79672 Pain in left foot: Secondary | ICD-10-CM

## 2017-07-30 MED ORDER — HYDROCODONE-ACETAMINOPHEN 5-325 MG PO TABS: 1.0000 | ORAL_TABLET | Freq: Four times a day (QID) | ORAL | 0 refills | Status: DC | PRN

## 2017-07-30 NOTE — Progress Notes (Signed)
Renewed rx for hydrocodone 5/325mg  tablets to take at night. Will discuss dose changes at next visit.

## 2017-07-30 NOTE — Telephone Encounter (Signed)
Renewed rx for hydrocodone 5/325mg  tablets to take at night. Will discuss dose changes at next visit.

## 2017-07-30 NOTE — Telephone Encounter (Signed)
Sent message to provider

## 2017-08-02 ENCOUNTER — Telehealth: Payer: Self-pay

## 2017-08-02 NOTE — Telephone Encounter (Signed)
Spoke to pt on 07/30/17 and advised that rx for pain meds was sent to pharmacy and that a new dose change will be discussed at next visit.  dbs

## 2017-08-10 ENCOUNTER — Other Ambulatory Visit: Payer: Self-pay

## 2017-08-10 MED ORDER — TIZANIDINE HCL 4 MG PO TABS: ORAL_TABLET | ORAL | 1 refills | Status: DC

## 2017-08-12 DIAGNOSIS — M5030 Other cervical disc degeneration, unspecified cervical region: Secondary | ICD-10-CM | POA: Diagnosis not present

## 2017-08-12 DIAGNOSIS — M5412 Radiculopathy, cervical region: Secondary | ICD-10-CM | POA: Diagnosis not present

## 2017-08-20 ENCOUNTER — Ambulatory Visit (INDEPENDENT_AMBULATORY_CARE_PROVIDER_SITE_OTHER): Payer: 59 | Admitting: Nurse Practitioner

## 2017-08-20 ENCOUNTER — Encounter: Payer: Self-pay | Admitting: Nurse Practitioner

## 2017-08-20 VITALS — BP 129/58 | HR 58 | Resp 16 | Ht 64.0 in | Wt 189.8 lb

## 2017-08-20 DIAGNOSIS — R300 Dysuria: Secondary | ICD-10-CM | POA: Diagnosis not present

## 2017-08-20 DIAGNOSIS — E1142 Type 2 diabetes mellitus with diabetic polyneuropathy: Secondary | ICD-10-CM | POA: Diagnosis not present

## 2017-08-20 DIAGNOSIS — M792 Neuralgia and neuritis, unspecified: Secondary | ICD-10-CM | POA: Diagnosis not present

## 2017-08-20 DIAGNOSIS — N390 Urinary tract infection, site not specified: Secondary | ICD-10-CM | POA: Diagnosis not present

## 2017-08-20 DIAGNOSIS — M79672 Pain in left foot: Secondary | ICD-10-CM

## 2017-08-20 DIAGNOSIS — E1165 Type 2 diabetes mellitus with hyperglycemia: Secondary | ICD-10-CM | POA: Diagnosis not present

## 2017-08-20 DIAGNOSIS — M79671 Pain in right foot: Secondary | ICD-10-CM

## 2017-08-20 DIAGNOSIS — Z79899 Other long term (current) drug therapy: Secondary | ICD-10-CM | POA: Diagnosis not present

## 2017-08-20 LAB — POCT URINE DRUG SCREEN
POC Amphetamine UR: NOT DETECTED
POC BENZODIAZEPINES UR: NOT DETECTED
POC Barbiturate UR: NOT DETECTED
POC Cocaine UR: NOT DETECTED
POC Ecstasy UR: NOT DETECTED
POC Marijuana UR: NOT DETECTED
POC Methadone UR: NOT DETECTED
POC Methamphetamine UR: NOT DETECTED
POC Opiate Ur: POSITIVE — AB
POC Oxycodone UR: NOT DETECTED
POC PHENCYCLIDINE UR: NOT DETECTED
POC TRICYCLICS UR: NOT DETECTED

## 2017-08-20 LAB — POCT URINALYSIS DIPSTICK
Bilirubin, UA: NEGATIVE
Blood, UA: NEGATIVE
Glucose, UA: NEGATIVE
Ketones, UA: NEGATIVE
Nitrite, UA: NEGATIVE
Protein, UA: NEGATIVE
Spec Grav, UA: 1.01
Urobilinogen, UA: 0.2 U/dL
pH, UA: 5

## 2017-08-20 LAB — POCT GLYCOSYLATED HEMOGLOBIN (HGB A1C): Hemoglobin A1C: 5.4

## 2017-08-20 MED ORDER — PHENAZOPYRIDINE HCL 200 MG PO TABS: 200.0000 mg | ORAL_TABLET | Freq: Three times a day (TID) | ORAL | 0 refills | Status: DC | PRN

## 2017-08-20 MED ORDER — HYDROCODONE-ACETAMINOPHEN 7.5-325 MG PO TABS
1.0000 | ORAL_TABLET | ORAL | 0 refills | Status: DC | PRN
Start: 2017-08-20 — End: 2017-11-25

## 2017-08-20 MED ORDER — CIPROFLOXACIN HCL 500 MG PO TABS: 500.0000 mg | ORAL_TABLET | Freq: Two times a day (BID) | ORAL | 0 refills | Status: DC

## 2017-08-20 MED ORDER — GABAPENTIN 300 MG PO CAPS: ORAL_CAPSULE | ORAL | 1 refills | Status: DC

## 2017-08-20 NOTE — Progress Notes (Signed)
[facility] [place], [place] [address]  Internal MEDICINE  Office Visit Note  Patient Name: Wendy Hubbard  [id number]  [id number]  Date of Service: 08/21/2017  Chief Complaint  Patient presents with  . Urinary Tract Infection  . Diabetes    worsening diabetic neuropathy.    The patient is here for routine follow up exam. She is diabetic and blood sugars are well controlled. She is c/o worsening neuropathy in both feet. They are cold and they burn. This is especially bad at night. She states that some nights, the feet hurt so bad, she can't even put sheets on them. Currently taking gabapentin 300mg  three times daily. She will take hydrocodone/APAP 5/325mg  at night to help reduce the pain in order for her to rest. She states that she recently has had to take 1.5 tablets in order to get enough relief to sleep.  She is also c/o burning with urination. This started yesterday. Has been happening with every urination. She also has mild right sided flank pain. She reports no nausea or vomiting. She has no fever.    Pt is here for routine follow up.    Current Medication: Outpatient Encounter Medications as of 08/20/2017  Medication Sig Note  . cetirizine (ZYRTEC) 10 MG tablet Take 10 mg by mouth daily.   . ciprofloxacin (CIPRO) 500 MG tablet Take 1 tablet (500 mg total) by mouth 2 (two) times daily.   . Dulaglutide (TRULICITY) 4.26 ST/4.1DQ SOPN Inject into the skin.   Marland Kitchen escitalopram (LEXAPRO) 10 MG tablet Take 1 tablet (10 mg total) by mouth every evening.   . furosemide (LASIX) 40 MG tablet Take 40 mg by mouth daily.  08/22/2014: .   . gabapentin (NEURONTIN) 300 MG capsule Take 1 capsule po BID. Take 2 capsules po QHS   . glimepiride (AMARYL) 1 MG tablet Take 1 mg by mouth daily with breakfast.  08/22/2014: .   Marland Kitchen HYDROcodone-acetaminophen (NORCO) 7.5-325 MG tablet Take 1 tablet by mouth every 4 (four) hours as needed for moderate pain.   Marland Kitchen lansoprazole (PREVACID) 30  MG capsule Take 1 capsule (30 mg total) by mouth 2 (two) times daily. (Patient not taking: Reported on 07/19/2017)   . levofloxacin (LEVAQUIN) 500 MG tablet Take 1 tablet (500 mg total) by mouth daily. (Patient not taking: Reported on 08/20/2017)   . metoprolol succinate (TOPROL-XL) 50 MG 24 hr tablet Take 1 tablet (50 mg total) by mouth at bedtime. Take with or immediately following a meal.   . montelukast (SINGULAIR) 10 MG tablet Take 10 mg by mouth daily.   . phenazopyridine (PYRIDIUM) 200 MG tablet Take 1 tablet (200 mg total) by mouth 3 (three) times daily as needed for pain.   Marland Kitchen spironolactone (ALDACTONE) 50 MG tablet Take 50 mg by mouth daily. 08/22/2014: .   Marland Kitchen tiZANidine (ZANAFLEX) 4 MG tablet Take 1 tablet at bedtime   . [DISCONTINUED] gabapentin (NEURONTIN) 300 MG capsule Take 1 capsule (300 mg total) by mouth 3 (three) times daily.   . [DISCONTINUED] HYDROcodone-acetaminophen (NORCO/VICODIN) 5-325 MG tablet Take 1 tablet by mouth every 6 (six) hours as needed for moderate pain.    No facility-administered encounter medications on file as of 08/20/2017.     Surgical History: Past Surgical History:  Procedure Laterality Date  . ABDOMINAL HYSTERECTOMY    . ANKLE ARTHROSCOPY Bilateral   . APPENDECTOMY    . BLADDER SUSPENSION    . BREAST EXCISIONAL BIOPSY Left 1990  neg  . BREAST EXCISIONAL BIOPSY Right 1989   neg  . BREAST SURGERY    . CHOLECYSTECTOMY    . COLONOSCOPY N/A 11/04/2015   Procedure: COLONOSCOPY;  Surgeon: Manya Silvas, MD;  Location: [facility] ENDOSCOPY;  Service: Endoscopy;  Laterality: N/A;  . ESOPHAGOGASTRODUODENOSCOPY N/A 11/02/2015   Procedure: ESOPHAGOGASTRODUODENOSCOPY (EGD);  Surgeon: Manya Silvas, MD;  Location: [facility] ENDOSCOPY;  Service: Endoscopy;  Laterality: N/A;  . KNEE ARTHROSCOPY Left   . REDUCTION MAMMAPLASTY Bilateral 1988  . TONSILLECTOMY      Medical History: Past Medical History:  Diagnosis Date  . COPD (chronic obstructive pulmonary  disease) ([place])   . Diabetes mellitus without complication ([place])   . GERD (gastroesophageal reflux disease)   . Hyperlipidemia   . Hypertension   . Rapid heart rate   . Sleep apnea     Family History: Family History  Problem Relation Age of Onset  . Hypertension Other   . Bladder Cancer Mother   . Kidney cancer Neg Hx   . Prostate cancer Neg Hx     Social History   Socioeconomic History  . Marital status: Married    Spouse name: Not on file  . Number of children: Not on file  . Years of education: Not on file  . Highest education level: Not on file  Occupational History  . Not on file  Social Needs  . Financial resource strain: Not on file  . Food insecurity:    Worry: Not on file    Inability: Not on file  . Transportation needs:    Medical: Not on file    Non-medical: Not on file  Tobacco Use  . Smoking status: Former Research scientist (life sciences)  . Smokeless tobacco: Never Used  Substance and Sexual Activity  . Alcohol use: Yes    Alcohol/week: 0.0 oz    Comment: occasional  . Drug use: No  . Sexual activity: Not on file  Lifestyle  . Physical activity:    Days per week: Not on file    Minutes per session: Not on file  . Stress: Not on file  Relationships  . Social connections:    Talks on phone: Not on file    Gets together: Not on file    Attends religious service: Not on file    Active member of club or organization: Not on file    Attends meetings of clubs or organizations: Not on file    Relationship status: Not on file  . Intimate partner violence:    Fear of current or ex partner: Not on file    Emotionally abused: Not on file    Physically abused: Not on file    Forced sexual activity: Not on file  Other Topics Concern  . Not on file  Social History Narrative  . Not on file      Review of Systems  Constitutional: Positive for chills. Negative for activity change, fatigue and unexpected weight change.  HENT: Negative for congestion, postnasal drip,  rhinorrhea, sneezing and sore throat.   Eyes: Negative.  Negative for redness.  Respiratory: Negative for cough, chest tightness and shortness of breath.   Cardiovascular: Negative for chest pain and palpitations.  Gastrointestinal: Negative for abdominal pain, constipation, diarrhea, nausea and vomiting.  Endocrine:       Blood sugars doing well   Genitourinary: Positive for dysuria, flank pain and frequency.  Musculoskeletal: Positive for back pain. Negative for arthralgias, joint swelling and neck pain.  Skin: Negative for  rash.  Allergic/Immunologic: Positive for environmental allergies.  Neurological: Positive for numbness. Negative for tremors.       Burning and numbness in both feet, has been getting worse.   Hematological: Negative for adenopathy. Does not bruise/bleed easily.  Psychiatric/Behavioral: Negative for behavioral problems (Depression), sleep disturbance and suicidal ideas. The patient is not nervous/anxious.    Today's Vitals   08/20/17 1042  BP: (!) 129/58  Pulse: (!) 58  Resp: 16  SpO2: 96%  Weight: 189 lb 12.8 oz (86.1 kg)  Height: 5\' 4"  (1.626 m)    Physical Exam  Constitutional: She is oriented to person, place, and time. She appears well-developed and well-nourished.  HENT:  Head: Normocephalic and atraumatic.  Eyes: Pupils are equal, round, and reactive to light.  Neck: Normal range of motion. Neck supple. No JVD present. Carotid bruit is not present. No thyromegaly present.  Cardiovascular: Normal rate, regular rhythm, normal heart sounds and normal pulses.  Cold lower extremities with normal pedal pulses. Capillary refill <2seconds, bilaterally  Pulmonary/Chest: Effort normal and breath sounds normal. She has no wheezes.  Abdominal: Soft. Bowel sounds are normal. There is no tenderness.  Genitourinary:  Genitourinary Comments: Urine sample positive for trace WBC  Musculoskeletal: Normal range of motion.  Neurological: She is alert and oriented to  person, place, and time. A sensory deficit is present.  significant numbness in both feet. Intact pedal pulses.   Skin: Skin is warm and dry. Capillary refill takes less than 2 seconds.  Psychiatric: She has a normal mood and affect. Her behavior is normal. Judgment and thought content normal.  Nursing note and vitals reviewed.  Assessment/Plan: 1. Type 2 diabetes mellitus with peripheral neuropathy (HCC)  POCT HgB A1C 5.4 today. Worsening neuropathy in both feet. Continue diabetic medication as prescribed. Increased gabapentin 300mg  to 1 capsule in AM and afternoon and two capsules at bedtime. Reassess at next visit.   2. Pain in both feet Increased hydrocodone/APAP to 7.5/325mg  at bedtime as needed for pain. Advised her to use this with caution, especially with gabapentin, as this may cause dizziness/drowsiness.   3. Neuralgia and neuritis, unspecified - gabapentin (NEURONTIN) 300 MG capsule; Take 1 capsule po BID. Take 2 capsules po QHS  Dispense: 360 capsule; Refill: 1 - HYDROcodone-acetaminophen (NORCO) 7.5-325 MG tablet; Take 1 tablet by mouth every 4 (four) hours as needed for moderate pain.  Dispense: 30 tablet; Refill: 0  4. Urinary tract infection without hematuria, site unspecified - ciprofloxacin (CIPRO) 500 MG tablet; Take 1 tablet (500 mg total) by mouth 2 (two) times daily.  Dispense: 14 tablet; Refill: 0 - CULTURE, URINE COMPREHENSIVE  5. Dysuria - POCT Urinalysis Dipstick positive for trace WBC. Treat for infection and add pyridium 200mg  TID as needed for bladder pain/spasms.  - phenazopyridine (PYRIDIUM) 200 MG tablet; Take 1 tablet (200 mg total) by mouth 3 (three) times daily as needed for pain.  Dispense: 10 tablet; Refill: 0  6. Encounter for Noe-term (current) use of medications - POCT Urine Drug Screen appropriately positive for opiates.   General Counseling: Atianna verbalizes understanding of the findings of todays visit and agrees with plan of treatment. I have  discussed any further diagnostic evaluation that may be needed or ordered today. We also reviewed her medications today. she has been encouraged to call the office with any questions or concerns that should arise related to todays visit.   Reviewed risks and possible side effects associated with taking opiates and benzodiazepines. Combination of these could  cause dizziness and drowsiness. Advised him not to drive or operate machinery when taking these medications, as he could put his life and the lives of others at risk. He voiced understanding.   This patient was seen by Leretha Pol, FNP- C in Collaboration with Dr Lavera Guise as a part of collaborative care agreement    Orders Placed This Encounter  Procedures  . CULTURE, URINE COMPREHENSIVE  . POCT HgB A1C  . POCT Urine Drug Screen  . POCT Urinalysis Dipstick    Meds ordered this encounter  Medications  . ciprofloxacin (CIPRO) 500 MG tablet    Sig: Take 1 tablet (500 mg total) by mouth 2 (two) times daily.    Dispense:  14 tablet    Refill:  0    Order Specific Question:   Supervising Provider    Answer:   Lavera Guise [[id number]]  . phenazopyridine (PYRIDIUM) 200 MG tablet    Sig: Take 1 tablet (200 mg total) by mouth 3 (three) times daily as needed for pain.    Dispense:  10 tablet    Refill:  0    Order Specific Question:   Supervising Provider    Answer:   Lavera Guise [[id number]]  . gabapentin (NEURONTIN) 300 MG capsule    Sig: Take 1 capsule po BID. Take 2 capsules po QHS    Dispense:  360 capsule    Refill:  1    Increased dose at night    Order Specific Question:   Supervising Provider    Answer:   Lavera Guise [[id number]]  . HYDROcodone-acetaminophen (NORCO) 7.5-325 MG tablet    Sig: Take 1 tablet by mouth every 4 (four) hours as needed for moderate pain.    Dispense:  30 tablet    Refill:  0    Slight increase in dose    Order Specific Question:   Supervising Provider    Answer:   Lavera Guise [[id number]]    Time  spent: 25 Minutes     Dr Lavera Guise Internal medicine

## 2017-08-21 DIAGNOSIS — M79671 Pain in right foot: Secondary | ICD-10-CM | POA: Insufficient documentation

## 2017-08-21 DIAGNOSIS — M79672 Pain in left foot: Secondary | ICD-10-CM | POA: Insufficient documentation

## 2017-08-21 DIAGNOSIS — R300 Dysuria: Secondary | ICD-10-CM | POA: Insufficient documentation

## 2017-08-21 DIAGNOSIS — Z1239 Encounter for other screening for malignant neoplasm of breast: Secondary | ICD-10-CM | POA: Insufficient documentation

## 2017-08-21 DIAGNOSIS — E1165 Type 2 diabetes mellitus with hyperglycemia: Secondary | ICD-10-CM | POA: Insufficient documentation

## 2017-08-21 DIAGNOSIS — M792 Neuralgia and neuritis, unspecified: Secondary | ICD-10-CM | POA: Insufficient documentation

## 2017-08-21 DIAGNOSIS — E1142 Type 2 diabetes mellitus with diabetic polyneuropathy: Secondary | ICD-10-CM | POA: Insufficient documentation

## 2017-08-21 DIAGNOSIS — N390 Urinary tract infection, site not specified: Secondary | ICD-10-CM | POA: Insufficient documentation

## 2017-08-23 LAB — CULTURE, URINE COMPREHENSIVE

## 2017-09-14 ENCOUNTER — Ambulatory Visit: Payer: 59 | Admitting: Internal Medicine

## 2017-09-14 ENCOUNTER — Other Ambulatory Visit: Payer: Self-pay

## 2017-09-14 ENCOUNTER — Encounter: Payer: Self-pay | Admitting: Internal Medicine

## 2017-09-14 VITALS — BP 106/62 | HR 89 | Temp 98.0°F | Resp 16 | Ht 64.0 in | Wt 195.4 lb

## 2017-09-14 DIAGNOSIS — Z9884 Bariatric surgery status: Secondary | ICD-10-CM | POA: Diagnosis not present

## 2017-09-14 DIAGNOSIS — R042 Hemoptysis: Secondary | ICD-10-CM | POA: Diagnosis not present

## 2017-09-14 DIAGNOSIS — K219 Gastro-esophageal reflux disease without esophagitis: Secondary | ICD-10-CM | POA: Diagnosis not present

## 2017-09-14 MED ORDER — ESCITALOPRAM OXALATE 10 MG PO TABS: 10.0000 mg | ORAL_TABLET | Freq: Every evening | ORAL | 0 refills | Status: DC

## 2017-09-17 NOTE — Progress Notes (Signed)
[facility] [place], [place] [address]  Internal MEDICINE  Office Visit Note  Patient Name: Wendy Hubbard  [id number]  [id number]  Date of Service: 09/17/2017  Chief Complaint  Patient presents with  . Cough    morning mucous with  blood and back pain , started a couple of months ago with back pain.    Pt is here for a sick visit. Cough  This is a recurrent problem. The current episode started more than 1 month ago. The problem has been gradually improving. The cough is productive of bloody sputum. Pertinent negatives include no chest pain, chills, ear pain, eye redness, headaches, postnasal drip, shortness of breath or wheezing. Nothing aggravates the symptoms. Treatments tried: Pt was treated about a month ago with antibiotics, she is now coughing up blood tinged sputum. The treatment provided mild relief. There is no history of environmental allergies.   Pt is also concerned about her weight gain of 10 lbs, she has lost about 80 lbs after her gastric bypass, she scheduled her app with the surgeon Denies any fever or chills   Current Medication:  Outpatient Encounter Medications as of 09/14/2017  Medication Sig Note  . gabapentin (NEURONTIN) 300 MG capsule Take 1 capsule po BID. Take 2 capsules po QHS (Patient taking differently: 4 (four) times daily. Take 1 capsule po BID. Take 2 capsules po QHS)   . HYDROcodone-acetaminophen (NORCO) 7.5-325 MG tablet Take 1 tablet by mouth every 4 (four) hours as needed for moderate pain.   . metoprolol succinate (TOPROL-XL) 50 MG 24 hr tablet Take 1 tablet (50 mg total) by mouth at bedtime. Take with or immediately following a meal.   . pantoprazole (PROTONIX) 40 MG tablet Take 40 mg by mouth daily.   Marland Kitchen tiZANidine (ZANAFLEX) 4 MG tablet Take 1 tablet at bedtime   . [DISCONTINUED] escitalopram (LEXAPRO) 10 MG tablet Take 1 tablet (10 mg total) by mouth every evening.   . cetirizine (ZYRTEC) 10 MG tablet Take 10 mg by mouth  daily.   . Dulaglutide (TRULICITY) 7.16 RC/7.8LF SOPN Inject into the skin.   . phenazopyridine (PYRIDIUM) 200 MG tablet Take 1 tablet (200 mg total) by mouth 3 (three) times daily as needed for pain.   Marland Kitchen spironolactone (ALDACTONE) 50 MG tablet Take 50 mg by mouth daily. 08/22/2014: .   Marland Kitchen [DISCONTINUED] ciprofloxacin (CIPRO) 500 MG tablet Take 1 tablet (500 mg total) by mouth 2 (two) times daily.   . [DISCONTINUED] furosemide (LASIX) 40 MG tablet Take 40 mg by mouth daily.  08/22/2014: .   Marland Kitchen [DISCONTINUED] glimepiride (AMARYL) 1 MG tablet Take 1 mg by mouth daily with breakfast.  08/22/2014: .   Marland Kitchen [DISCONTINUED] lansoprazole (PREVACID) 30 MG capsule Take 1 capsule (30 mg total) by mouth 2 (two) times daily. (Patient not taking: Reported on 07/19/2017)   . [DISCONTINUED] levofloxacin (LEVAQUIN) 500 MG tablet Take 1 tablet (500 mg total) by mouth daily. (Patient not taking: Reported on 08/20/2017)   . [DISCONTINUED] montelukast (SINGULAIR) 10 MG tablet Take 10 mg by mouth daily.    No facility-administered encounter medications on file as of 09/14/2017.       Medical History: Past Medical History:  Diagnosis Date  . COPD (chronic obstructive pulmonary disease) ([place])   . Diabetes mellitus without complication ([place])   . GERD (gastroesophageal reflux disease)   . Hyperlipidemia   . Hypertension   . Rapid heart rate   . Sleep apnea    Vital  Signs: BP 106/62   Pulse 89   Temp 98 F (36.7 C) (Oral)   Resp 16   Ht 5\' 4"  (1.626 m)   Wt 195 lb 6.4 oz (88.6 kg)   SpO2 96%   BMI 33.54 kg/m    Review of Systems  Constitutional: Negative for chills, diaphoresis and fatigue.  HENT: Negative for ear pain, postnasal drip and sinus pressure.   Eyes: Negative for photophobia, discharge, redness, itching and visual disturbance.  Respiratory: Positive for cough (with blood ). Negative for shortness of breath and wheezing.   Cardiovascular: Negative for chest pain, palpitations and leg swelling.   Gastrointestinal: Negative for abdominal pain, constipation, diarrhea, nausea and vomiting.  Genitourinary: Negative for dysuria and flank pain.  Musculoskeletal: Negative for arthralgias, back pain, gait problem and neck pain.  Skin: Negative for color change.  Allergic/Immunologic: Negative for environmental allergies and food allergies.  Neurological: Negative for dizziness and headaches.  Hematological: Does not bruise/bleed easily.  Psychiatric/Behavioral: Negative for agitation, behavioral problems (depression) and hallucinations.   Physical Exam  Constitutional: She is oriented to person, place, and time. She appears well-developed and well-nourished. No distress.  HENT:  Head: Normocephalic and atraumatic.  Mouth/Throat: Oropharynx is clear and moist. No oropharyngeal exudate.  Eyes: Pupils are equal, round, and reactive to light. EOM are normal.  Neck: Normal range of motion. Neck supple. No JVD present. No tracheal deviation present. No thyromegaly present.  Cardiovascular: Normal rate, regular rhythm and normal heart sounds. Exam reveals no gallop and no friction rub.  No murmur heard. Pulmonary/Chest: Effort normal. No respiratory distress. She has no wheezes. She has no rales. She exhibits no tenderness.  Lymphadenopathy:    She has no cervical adenopathy.  Neurological: She is alert and oriented to person, place, and time. No cranial nerve deficit.  Skin: Skin is warm and dry. She is not diaphoretic.  Psychiatric: She has a normal mood and affect. Her behavior is normal. Judgment and thought content normal.   Assessment/Plan: 1. Cough with hemoptysis - CT CHEST WO CONTRAST; Future Finish antibiotics  2. History of gastric bypass - F/U as before   3. Gastroesophageal reflux disease without esophagitis - Continue Protonix as before   General Counseling: [name] verbalizes understanding of the findings of todays visit and agrees with plan of treatment. I have discussed  any further diagnostic evaluation that may be needed or ordered today. We also reviewed her medications today. she has been encouraged to call the office with any questions or concerns that should arise related to todays visit.  Orders Placed This Encounter  Procedures  . CT CHEST WO CONTRAST   Time spent: 25 Minutes

## 2017-09-24 ENCOUNTER — Ambulatory Visit: Payer: BLUE CROSS/BLUE SHIELD

## 2017-09-28 ENCOUNTER — Ambulatory Visit: Payer: 59

## 2017-10-13 DIAGNOSIS — Z9884 Bariatric surgery status: Secondary | ICD-10-CM | POA: Diagnosis not present

## 2017-10-14 ENCOUNTER — Ambulatory Visit (INDEPENDENT_AMBULATORY_CARE_PROVIDER_SITE_OTHER): Payer: 59 | Admitting: Internal Medicine

## 2017-10-14 ENCOUNTER — Encounter: Payer: Self-pay | Admitting: Internal Medicine

## 2017-10-14 ENCOUNTER — Other Ambulatory Visit: Payer: Self-pay | Admitting: Internal Medicine

## 2017-10-14 VITALS — BP 138/66 | HR 66 | Resp 16 | Ht 64.0 in | Wt 192.0 lb

## 2017-10-14 DIAGNOSIS — R042 Hemoptysis: Secondary | ICD-10-CM

## 2017-10-14 DIAGNOSIS — R059 Cough, unspecified: Secondary | ICD-10-CM

## 2017-10-14 DIAGNOSIS — R0602 Shortness of breath: Secondary | ICD-10-CM

## 2017-10-14 DIAGNOSIS — R05 Cough: Secondary | ICD-10-CM

## 2017-10-14 NOTE — Patient Instructions (Signed)
Hemoptysis °Hemoptysis is when you cough up blood. It can be mild or serious. If it is mild, you may cough up bloody spit and mucus (sputum). If you cough up 1-2 cups (240-480 mL) of blood within 24 hours (massive hemoptysis), it is an emergency. °If you cough up blood, it is important to go and see your doctor. °Follow these instructions at home: °· Watch your condition for any changes. °· Take over-the-counter and prescription medicines only as told by your doctor. °· If you were prescribed an antibiotic medicine, take it as told by your doctor. Do not stop taking the antibiotic even if you start to feel better. °· Go back to your normal activities as told by your doctor. Ask your doctor what activities are safe for you to do. °· Do not use any products that contain nicotine or tobacco. These include cigarettes and e-cigarettes. If you need help quitting, ask your doctor. °· Keep all follow-up visits as told by your doctor. This is important. °Contact a doctor if: °· You have a fever. °· You cough up bloody spit and mucus. °Get help right away if: °· You cough up fresh blood or blood clots. °· You have trouble breathing. °· You have chest pain. °This information is not intended to replace advice given to you by your health care provider. Make sure you discuss any questions you have with your health care provider. °Document Released: 04/13/2012 Document Revised: 01/24/2016 Document Reviewed: 01/24/2016 °Elsevier Interactive Patient Education © 2018 [employer] ° °

## 2017-10-14 NOTE — Progress Notes (Signed)
[name] [place], [place] [address]  Pulmonary Sleep Medicine   Office Visit Note  Patient Name: Wendy Hubbard DOB: 1955/10/30 MRN [id number]  Date of Service: 10/14/2017  Complaints/HPI: Patient has been having hemoptysis for some time now she was seen in the office and recommended to have a CT scan of the chest done.  She did not go for the CT scan of the chest because she stated that there was a $600 deductible to be met.  On questioning today she states that she is still having hemoptysis although it is a little bit better.  Had been treated with antibiotics and there has not been complete resolution of the hemoptysis as already noted above.  ROS  General: (-) fever, (-) chills, (-) night sweats, (-) weakness Skin: (-) rashes, (-) itching,. Eyes: (-) visual changes, (-) redness, (-) itching. Nose and Sinuses: (-) nasal stuffiness or itchiness, (-) postnasal drip, (-) nosebleeds, (-) sinus trouble. Mouth and Throat: (-) sore throat, (-) hoarseness. Neck: (-) swollen glands, (-) enlarged thyroid, (-) neck pain. Respiratory: - cough, (-) bloody sputum, - shortness of breath, - wheezing. Cardiovascular: - ankle swelling, (-) chest pain. Lymphatic: (-) lymph node enlargement. Neurologic: (-) numbness, (-) tingling. Psychiatric: (-) anxiety, (-) depression   Current Medication: Outpatient Encounter Medications as of 10/14/2017  Medication Sig Note  . cetirizine (ZYRTEC) 10 MG tablet Take 10 mg by mouth daily.   . Dulaglutide (TRULICITY) 5.27 PO/2.4MP SOPN Inject into the skin.   Marland Kitchen escitalopram (LEXAPRO) 10 MG tablet Take 1 tablet (10 mg total) by mouth every evening.   . gabapentin (NEURONTIN) 300 MG capsule Take 1 capsule po BID. Take 2 capsules po QHS (Patient taking differently: 4 (four) times daily. Take 1 capsule po BID. Take 2 capsules po QHS)   . HYDROcodone-acetaminophen (NORCO) 7.5-325 MG tablet Take 1 tablet by mouth every 4 (four) hours as needed  for moderate pain.   . metoprolol succinate (TOPROL-XL) 50 MG 24 hr tablet Take 1 tablet (50 mg total) by mouth at bedtime. Take with or immediately following a meal.   . pantoprazole (PROTONIX) 40 MG tablet Take 40 mg by mouth daily.   . phenazopyridine (PYRIDIUM) 200 MG tablet Take 1 tablet (200 mg total) by mouth 3 (three) times daily as needed for pain.   Marland Kitchen spironolactone (ALDACTONE) 50 MG tablet Take 50 mg by mouth daily. 08/22/2014: .   Marland Kitchen tiZANidine (ZANAFLEX) 4 MG tablet Take 1 tablet at bedtime    No facility-administered encounter medications on file as of 10/14/2017.     Surgical History: Past Surgical History:  Procedure Laterality Date  . ABDOMINAL HYSTERECTOMY    . ANKLE ARTHROSCOPY Bilateral   . APPENDECTOMY    . BLADDER SUSPENSION    . BREAST EXCISIONAL BIOPSY Left 1990   neg  . BREAST EXCISIONAL BIOPSY Right 1989   neg  . BREAST SURGERY    . CHOLECYSTECTOMY    . COLONOSCOPY N/A 11/04/2015   Procedure: COLONOSCOPY;  Surgeon: Manya Silvas, MD;  Location[facility] ENDOSCOPY;  Service: Endoscopy;  Laterality: N/A;  . ESOPHAGOGASTRODUODENOSCOPY N/A 11/02/2015   Procedure: ESOPHAGOGASTRODUODENOSCOPY (EGD);  Surgeon: Manya Silvas, MD;  Location: [facility] ENDOSCOPY;  Service: Endoscopy;  Laterality: N/A;  . KNEE ARTHROSCOPY Left   . REDUCTION MAMMAPLASTY Bilateral 1988  . TONSILLECTOMY      Medical History: Past Medical History:  Diagnosis Date  . COPD (chronic obstructive pulmonary disease) ([place])   . Diabetes mellitus  without complication ([place])   . GERD (gastroesophageal reflux disease)   . Hyperlipidemia   . Hypertension   . Rapid heart rate   . Sleep apnea     Family History: Family History  Problem Relation Age of Onset  . Hypertension Other   . Bladder Cancer Mother   . Kidney cancer Neg Hx   . Prostate cancer Neg Hx     Social History: Social History   Socioeconomic History  . Marital status: Married    Spouse name: Not on file  . Number of  children: Not on file  . Years of education: Not on file  . Highest education level: Not on file  Occupational History  . Not on file  Social Needs  . Financial resource strain: Not on file  . Food insecurity:    Worry: Not on file    Inability: Not on file  . Transportation needs:    Medical: Not on file    Non-medical: Not on file  Tobacco Use  . Smoking status: Former Research scientist (life sciences)  . Smokeless tobacco: Never Used  Substance and Sexual Activity  . Alcohol use: Yes    Alcohol/week: 0.0 oz    Comment: occasional  . Drug use: No  . Sexual activity: Not on file  Lifestyle  . Physical activity:    Days per week: Not on file    Minutes per session: Not on file  . Stress: Not on file  Relationships  . Social connections:    Talks on phone: Not on file    Gets together: Not on file    Attends religious service: Not on file    Active member of club or organization: Not on file    Attends meetings of clubs or organizations: Not on file    Relationship status: Not on file  . Intimate partner violence:    Fear of current or ex partner: Not on file    Emotionally abused: Not on file    Physically abused: Not on file    Forced sexual activity: Not on file  Other Topics Concern  . Not on file  Social History Narrative  . Not on file    Vital Signs: Blood pressure 138/66, pulse 66, resp. rate 16, height 5' 4"  (1.626 m), weight 192 lb (87.1 kg), SpO2 95 %.  Examination: General Appearance: The patient is well-developed, well-nourished, and in no distress. Skin: Gross inspection of skin unremarkable. Head: normocephalic, no gross deformities. Eyes: no gross deformities noted. ENT: ears appear grossly normal no exudates. Neck: Supple. No thyromegaly. No LAD. Respiratory: no rhonchi noted. Cardiovascular: Normal S1 and S2 without murmur or rub. Extremities: No cyanosis. pulses are equal. Neurologic: Alert and oriented. No involuntary movements.  LABS: Recent Results (from the  past 2160 hour(s))  POCT rapid strep A     Status: None   Collection Time: 07/19/17 12:17 PM  Result Value Ref Range   Rapid Strep A Screen Negative Negative  POCT Influenza A/B     Status: None   Collection Time: 07/19/17 12:17 PM  Result Value Ref Range   Influenza A, POC Negative Negative   Influenza B, POC Negative Negative  POCT HgB A1C     Status: None   Collection Time: 08/20/17 10:52 AM  Result Value Ref Range   Hemoglobin A1C 5.4   POCT Urinalysis Dipstick     Status: Abnormal   Collection Time: 08/20/17 10:52 AM  Result Value Ref Range   Color, UA  Clarity, UA     Glucose, UA negative    Bilirubin, UA negative    Ketones, UA negative    Spec Grav, UA 1.010 1.010 - 1.025   Blood, UA negative    pH, UA 5.0 5.0 - 8.0   Protein, UA negative    Urobilinogen, UA 0.2 0.2 or 1.0 E.U./dL   Nitrite, UA negative    Leukocytes, UA Trace (A) Negative   Appearance     Odor    POCT Urine Drug Screen     Status: Abnormal   Collection Time: 08/20/17 10:54 AM  Result Value Ref Range   POC METHAMPHETAMINE UR None Detected None Detected   POC Opiate Ur Positive (A) None Detected   POC Barbiturate UR None Detected None Detected   POC Amphetamine UR None Detected None Detected   POC Oxycodone UR None Detected None Detected   POC Cocaine UR None Detected None Detected   POC Ecstasy UR None Detected None Detected   POC TRICYCLICS UR None Detected None Detected   POC PHENCYCLIDINE UR None Detected None Detected   POC MARIJUANA UR None Detected None Detected   POC METHADONE UR None Detected None Detected   POC BENZODIAZEPINES UR None Detected None Detected   URINE TEMPERATURE  90.0 - 100.0 Degrees F   POC DRUG SCREEN OXIDANTS URINE  Normal   POC SPECIFIC GRAVITY URINE  Normal   POC PH URINE  Normal   Methylenedioxyamphetamine    CULTURE, URINE COMPREHENSIVE     Status: None   Collection Time: 08/20/17 11:00 AM  Result Value Ref Range   Urine Culture, Comprehensive Final  report    Organism ID, Bacteria Comment     Comment: Mixed urogenital flora 25,000-50,000 colony forming units per mL     Radiology: Mm Digital Screening Bilateral  Result Date: 02/18/2016 CLINICAL DATA:  Screening. EXAM: DIGITAL SCREENING BILATERAL MAMMOGRAM WITH CAD COMPARISON:  Previous exam(s). ACR Breast Density Category b: There are scattered areas of fibroglandular density. FINDINGS: There are no findings suspicious for malignancy. Images were processed with CAD. IMPRESSION: No mammographic evidence of malignancy. A result letter of this screening mammogram will be mailed directly to the patient. RECOMMENDATION: Screening mammogram in one year. (Code:SM-B-01Y) BI-RADS CATEGORY  1: Negative. Electronically Signed   By: Ammie Ferrier M.D.   On: 02/18/2016 13:57    No results found.  No results found.    Assessment and Plan: Patient Active Problem List   Diagnosis Date Noted  . Type 2 diabetes mellitus with peripheral neuropathy ([place]) 08/21/2017  . Pain in both feet 08/21/2017  . Neuralgia and neuritis, unspecified 08/21/2017  . Urinary tract infection without hematuria 08/21/2017  . Dysuria 08/21/2017  . Encounter for [place]-term (current) use of medications 08/21/2017  . Gonalgia 01/22/2015  . Arthritis of knee, degenerative 10/04/2014    1. Hemoptysis I stressed to her the importance of going and getting the CT scan done.  I have reordered the CT for the purpose of the hemoptysis to make certain that there is no endobronchial lesions or any other type of masses that we need to be worried about. 2. SOB this is slowly improving but she still states that she is not back to her baseline. 3. Cough still producing some sputum and has noted some of there is sputum is with blood in it.  Once again raised very great deal of concern for her that she needs to be compliant with recommended medical management and therapy  General Counseling: I have discussed the findings of the  evaluation and examination with [facility].  I have also discussed any further diagnostic evaluation thatmay be needed or ordered today. Kishia verbalizes understanding of the findings of todays visit. We also reviewed her medications today and discussed drug interactions and side effects including but not limited excessive drowsiness and altered mental states. We also discussed that there is always a risk not just to her but also people around her. she has been encouraged to call the office with any questions or concerns that should arise related to todays visit.    Time spent: 68mn  I have personally obtained a history, examined the patient, evaluated laboratory and imaging results, formulated the assessment and plan and placed orders.    SAllyne Gee MD [facility]ulmonary and Critical Care Sleep medicine

## 2017-10-15 ENCOUNTER — Ambulatory Visit
Admission: RE | Admit: 2017-10-15 | Discharge: 2017-10-15 | Disposition: A | Discharge status: Home / Self Care | Payer: 59 | Source: Ambulatory Visit | Attending: Internal Medicine | Admitting: Internal Medicine

## 2017-10-15 ENCOUNTER — Ambulatory Visit: Admission: RE | Admit: 2017-10-15 | Payer: 59 | Source: Ambulatory Visit

## 2017-10-15 DIAGNOSIS — R042 Hemoptysis: Secondary | ICD-10-CM | POA: Diagnosis not present

## 2017-10-15 DIAGNOSIS — I700 Atherosclerosis of aorta: Secondary | ICD-10-CM | POA: Insufficient documentation

## 2017-10-18 ENCOUNTER — Telehealth: Payer: Self-pay | Admitting: Internal Medicine

## 2017-10-18 ENCOUNTER — Ambulatory Visit (INDEPENDENT_AMBULATORY_CARE_PROVIDER_SITE_OTHER): Payer: 59 | Admitting: Internal Medicine

## 2017-10-18 ENCOUNTER — Encounter: Payer: Self-pay | Admitting: Internal Medicine

## 2017-10-18 ENCOUNTER — Other Ambulatory Visit: Payer: Self-pay

## 2017-10-18 ENCOUNTER — Ambulatory Visit: Payer: 59

## 2017-10-18 VITALS — BP 132/70 | HR 71 | Resp 16 | Ht 64.0 in | Wt 193.6 lb

## 2017-10-18 DIAGNOSIS — R042 Hemoptysis: Secondary | ICD-10-CM

## 2017-10-18 DIAGNOSIS — M7989 Other specified soft tissue disorders: Secondary | ICD-10-CM

## 2017-10-18 MED ORDER — TIZANIDINE HCL 4 MG PO TABS: ORAL_TABLET | ORAL | 1 refills | Status: DC

## 2017-10-18 NOTE — Progress Notes (Signed)
[facility] [place], [place] [address]  Pulmonary Sleep Medicine   Office Visit Note  Patient Name: Wendy Hubbard DOB: 1955-11-02 MRN [id number]  Date of Service: 10/18/2017  Complaints/HPI:  She came in because of hemoptysis.  She was scheduled to have CT scan of the chest done and she did not get that done.  Spoke to her at length that is very important to get the CT done.  She did get the scan done at this time she has no acute pulmonary emboli.  Still complaining of having some leg swelling.  Breathing is overall improved.  Denies having chest pain no palpitations at this time.  ROS  General: (-) fever, (-) chills, (-) night sweats, (-) weakness Skin: (-) rashes, (-) itching,. Eyes: (-) visual changes, (-) redness, (-) itching. Nose and Sinuses: (-) nasal stuffiness or itchiness, (-) postnasal drip, (-) nosebleeds, (-) sinus trouble. Mouth and Throat: (-) sore throat, (-) hoarseness. Neck: (-) swollen glands, (-) enlarged thyroid, (-) neck pain. Respiratory: + cough, (-) bloody sputum, - shortness of breath, - wheezing. Cardiovascular: + ankle swelling, (-) chest pain. Lymphatic: (-) lymph node enlargement. Neurologic: (-) numbness, (-) tingling. Psychiatric: (-) anxiety, (-) depression   Current Medication: Outpatient Encounter Medications as of 10/18/2017  Medication Sig Note  . cetirizine (ZYRTEC) 10 MG tablet Take 10 mg by mouth daily.   . Dulaglutide (TRULICITY) 2.59 DG/3.8VF SOPN Inject into the skin.   Marland Kitchen escitalopram (LEXAPRO) 10 MG tablet Take 1 tablet (10 mg total) by mouth every evening.   . gabapentin (NEURONTIN) 300 MG capsule Take 1 capsule po BID. Take 2 capsules po QHS (Patient taking differently: 4 (four) times daily. Take 1 capsule po BID. Take 2 capsules po QHS)   . HYDROcodone-acetaminophen (NORCO) 7.5-325 MG tablet Take 1 tablet by mouth every 4 (four) hours as needed for moderate pain.   . metoprolol succinate (TOPROL-XL) 50 MG 24  hr tablet Take 1 tablet (50 mg total) by mouth at bedtime. Take with or immediately following a meal.   . pantoprazole (PROTONIX) 40 MG tablet Take 40 mg by mouth daily.   . phenazopyridine (PYRIDIUM) 200 MG tablet Take 1 tablet (200 mg total) by mouth 3 (three) times daily as needed for pain.   Marland Kitchen spironolactone (ALDACTONE) 50 MG tablet Take 50 mg by mouth daily. 08/22/2014: .   Marland Kitchen tiZANidine (ZANAFLEX) 4 MG tablet Take 1 tablet at bedtime   . [DISCONTINUED] tiZANidine (ZANAFLEX) 4 MG tablet Take 1 tablet at bedtime    No facility-administered encounter medications on file as of 10/18/2017.     Surgical History: Past Surgical History:  Procedure Laterality Date  . ABDOMINAL HYSTERECTOMY    . ANKLE ARTHROSCOPY Bilateral   . APPENDECTOMY    . BLADDER SUSPENSION    . BREAST EXCISIONAL BIOPSY Left 1990   neg  . BREAST EXCISIONAL BIOPSY Right 1989   neg  . BREAST SURGERY    . CHOLECYSTECTOMY    . COLONOSCOPY N/A 11/04/2015   Procedure: COLONOSCOPY;  Surgeon: Manya Silvas, MD;  Location: [facility] ENDOSCOPY;  Service: Endoscopy;  Laterality: N/A;  . ESOPHAGOGASTRODUODENOSCOPY N/A 11/02/2015   Procedure: ESOPHAGOGASTRODUODENOSCOPY (EGD);  Surgeon: Manya Silvas, MD;  Location: [facility] ENDOSCOPY;  Service: Endoscopy;  Laterality: N/A;  . KNEE ARTHROSCOPY Left   . REDUCTION MAMMAPLASTY Bilateral 1988  . TONSILLECTOMY      Medical History: Past Medical History:  Diagnosis Date  . COPD (chronic obstructive pulmonary disease) ([place])   .  Diabetes mellitus without complication ([place])   . GERD (gastroesophageal reflux disease)   . Hyperlipidemia   . Hypertension   . Rapid heart rate   . Sleep apnea     Family History: Family History  Problem Relation Age of Onset  . Hypertension Other   . Bladder Cancer Mother   . Kidney cancer Neg Hx   . Prostate cancer Neg Hx     Social History: Social History   Socioeconomic History  . Marital status: Married    Spouse name: Not on file  .  Number of children: Not on file  . Years of education: Not on file  . Highest education level: Not on file  Occupational History  . Not on file  Social Needs  . Financial resource strain: Not on file  . Food insecurity:    Worry: Not on file    Inability: Not on file  . Transportation needs:    Medical: Not on file    Non-medical: Not on file  Tobacco Use  . Smoking status: Former Research scientist (life sciences)  . Smokeless tobacco: Never Used  Substance and Sexual Activity  . Alcohol use: Yes    Alcohol/week: 0.0 oz    Comment: occasional  . Drug use: No  . Sexual activity: Not on file  Lifestyle  . Physical activity:    Days per week: Not on file    Minutes per session: Not on file  . Stress: Not on file  Relationships  . Social connections:    Talks on phone: Not on file    Gets together: Not on file    Attends religious service: Not on file    Active member of club or organization: Not on file    Attends meetings of clubs or organizations: Not on file    Relationship status: Not on file  . Intimate partner violence:    Fear of current or ex partner: Not on file    Emotionally abused: Not on file    Physically abused: Not on file    Forced sexual activity: Not on file  Other Topics Concern  . Not on file  Social History Narrative  . Not on file    Vital Signs: Blood pressure 132/70, pulse 71, resp. rate 16, height 5\' 4"  (1.626 m), weight 193 lb 9.6 oz (87.8 kg), SpO2 96 %.  Examination: General Appearance: The patient is well-developed, well-nourished, and in no distress. Skin: Gross inspection of skin unremarkable. Head: normocephalic, no gross deformities. Eyes: no gross deformities noted. ENT: ears appear grossly normal no exudates. Neck: Supple. No thyromegaly. No LAD. Respiratory: no rhonchi. Cardiovascular: Normal S1 and S2 without murmur or rub. Extremities: No cyanosis. pulses are equal. Neurologic: Alert and oriented. No involuntary movements.  LABS: Recent Results  (from the past 2160 hour(s))  POCT HgB A1C     Status: None   Collection Time: 08/20/17 10:52 AM  Result Value Ref Range   Hemoglobin A1C 5.4   POCT Urinalysis Dipstick     Status: Abnormal   Collection Time: 08/20/17 10:52 AM  Result Value Ref Range   Color, UA     Clarity, UA     Glucose, UA negative    Bilirubin, UA negative    Ketones, UA negative    Spec Grav, UA 1.010 1.010 - 1.025   Blood, UA negative    pH, UA 5.0 5.0 - 8.0   Protein, UA negative    Urobilinogen, UA 0.2 0.2 or 1.0 E.U./dL  Nitrite, UA negative    Leukocytes, UA Trace (A) Negative   Appearance     Odor    POCT Urine Drug Screen     Status: Abnormal   Collection Time: 08/20/17 10:54 AM  Result Value Ref Range   POC METHAMPHETAMINE UR None Detected None Detected   POC Opiate Ur Positive (A) None Detected   POC Barbiturate UR None Detected None Detected   POC Amphetamine UR None Detected None Detected   POC Oxycodone UR None Detected None Detected   POC Cocaine UR None Detected None Detected   POC Ecstasy UR None Detected None Detected   POC TRICYCLICS UR None Detected None Detected   POC PHENCYCLIDINE UR None Detected None Detected   POC MARIJUANA UR None Detected None Detected   POC METHADONE UR None Detected None Detected   POC BENZODIAZEPINES UR None Detected None Detected   URINE TEMPERATURE  90.0 - 100.0 Degrees F   POC DRUG SCREEN OXIDANTS URINE  Normal   POC SPECIFIC GRAVITY URINE  Normal   POC PH URINE  Normal   Methylenedioxyamphetamine    CULTURE, URINE COMPREHENSIVE     Status: None   Collection Time: 08/20/17 11:00 AM  Result Value Ref Range   Urine Culture, Comprehensive Final report    Organism ID, Bacteria Comment     Comment: Mixed urogenital flora 25,000-50,000 colony forming units per mL     Radiology: Ct Chest Wo Contrast  Result Date: 10/15/2017 CLINICAL DATA:  Pt c/o hemoptysis for at least 3 months, usually in the morning. Also c/o right posterior flank pain that is  sharp and intermittent. EXAM: CT CHEST WITHOUT CONTRAST TECHNIQUE: Multidetector CT imaging of the chest was performed following the standard protocol without IV contrast. COMPARISON:  None. FINDINGS: Cardiovascular: Heart size is normal. No pericardial effusion. No thoracic aortic aneurysm. Scattered mild aortic atherosclerosis. Mediastinum/Nodes: No mass or enlarged lymph nodes seen within the mediastinum or perihilar regions. Esophagus appears normal. Trachea and central bronchi are unremarkable. Lungs/Pleura: Lungs are clear.  No pleural effusion or pneumothorax. Upper Abdomen: No acute findings. Surgical changes of gastric bypass. Musculoskeletal: No chest wall mass or suspicious bone lesions identified. IMPRESSION: No acute findings. No source for hemoptysis identified. Lungs are clear. Aortic Atherosclerosis (ICD10-I70.0). Electronically Signed   By: Franki Cabot M.D.   On: 10/15/2017 16:19    Ct Chest Wo Contrast  Result Date: 10/15/2017 CLINICAL DATA:  Pt c/o hemoptysis for at least 3 months, usually in the morning. Also c/o right posterior flank pain that is sharp and intermittent. EXAM: CT CHEST WITHOUT CONTRAST TECHNIQUE: Multidetector CT imaging of the chest was performed following the standard protocol without IV contrast. COMPARISON:  None. FINDINGS: Cardiovascular: Heart size is normal. No pericardial effusion. No thoracic aortic aneurysm. Scattered mild aortic atherosclerosis. Mediastinum/Nodes: No mass or enlarged lymph nodes seen within the mediastinum or perihilar regions. Esophagus appears normal. Trachea and central bronchi are unremarkable. Lungs/Pleura: Lungs are clear.  No pleural effusion or pneumothorax. Upper Abdomen: No acute findings. Surgical changes of gastric bypass. Musculoskeletal: No chest wall mass or suspicious bone lesions identified. IMPRESSION: No acute findings. No source for hemoptysis identified. Lungs are clear. Aortic Atherosclerosis (ICD10-I70.0). Electronically  Signed   By: Franki Cabot M.D.   On: 10/15/2017 16:19    Ct Chest Wo Contrast  Result Date: 10/15/2017 CLINICAL DATA:  Pt c/o hemoptysis for at least 3 months, usually in the morning. Also c/o right posterior flank pain that is sharp  and intermittent. EXAM: CT CHEST WITHOUT CONTRAST TECHNIQUE: Multidetector CT imaging of the chest was performed following the standard protocol without IV contrast. COMPARISON:  None. FINDINGS: Cardiovascular: Heart size is normal. No pericardial effusion. No thoracic aortic aneurysm. Scattered mild aortic atherosclerosis. Mediastinum/Nodes: No mass or enlarged lymph nodes seen within the mediastinum or perihilar regions. Esophagus appears normal. Trachea and central bronchi are unremarkable. Lungs/Pleura: Lungs are clear.  No pleural effusion or pneumothorax. Upper Abdomen: No acute findings. Surgical changes of gastric bypass. Musculoskeletal: No chest wall mass or suspicious bone lesions identified. IMPRESSION: No acute findings. No source for hemoptysis identified. Lungs are clear. Aortic Atherosclerosis (ICD10-I70.0). Electronically Signed   By: Franki Cabot M.D.   On: 10/15/2017 16:19      Assessment and Plan: Patient Active Problem List   Diagnosis Date Noted  . Type 2 diabetes mellitus with peripheral neuropathy ([place]) 08/21/2017  . Pain in both feet 08/21/2017  . Neuralgia and neuritis, unspecified 08/21/2017  . Urinary tract infection without hematuria 08/21/2017  . Dysuria 08/21/2017  . Encounter for [place]-term (current) use of medications 08/21/2017  . Gonalgia 01/22/2015  . Arthritis of knee, degenerative 10/04/2014    1. Hemoptysis  CT scan did not show any mass lesions.  The scan was done without contrast appears that over some issues with her insurance.  She is doing better now at this time.  She has noticed leg swelling and I think that we should still will add the DVT to the rule out protocol 2. Leg swelling we will get an ultrasound of the legs  lower extremities to assess for DVT. 3.   Diabetes mellitus followed by her primary care physician  General Counseling: I have discussed the findings of the evaluation and examination with [facility].  I have also discussed any further diagnostic evaluation thatmay be needed or ordered today. Remell verbalizes understanding of the findings of todays visit. We also reviewed her medications today and discussed drug interactions and side effects including but not limited excessive drowsiness and altered mental states. We also discussed that there is always a risk not just to her but also people around her. she has been encouraged to call the office with any questions or concerns that should arise related to todays visit.    Time spent: 66min  I have personally obtained a history, examined the patient, evaluated laboratory and imaging results, formulated the assessment and plan and placed orders.    Allyne Gee, MD [facility] Pulmonary and Critical Care Sleep medicine

## 2017-10-18 NOTE — Telephone Encounter (Signed)
Called patient and left message and advised patient of Ultrasound appt. Beth

## 2017-10-19 ENCOUNTER — Ambulatory Visit
Admission: RE | Admit: 2017-10-19 | Discharge: 2017-10-19 | Disposition: A | Discharge status: Home / Self Care | Payer: 59 | Source: Ambulatory Visit | Attending: Internal Medicine | Admitting: Internal Medicine

## 2017-10-19 DIAGNOSIS — M7989 Other specified soft tissue disorders: Secondary | ICD-10-CM | POA: Diagnosis not present

## 2017-10-21 ENCOUNTER — Other Ambulatory Visit: Payer: Self-pay

## 2017-10-21 ENCOUNTER — Telehealth: Payer: Self-pay

## 2017-10-21 MED ORDER — LEVOFLOXACIN 500 MG PO TABS: ORAL_TABLET | ORAL | 0 refills | Status: DC

## 2017-10-21 NOTE — Telephone Encounter (Signed)
Pt advised we send levaquin for 10 days and take tussinex dm OTC for cough

## 2017-10-28 ENCOUNTER — Telehealth: Payer: Self-pay

## 2017-10-28 ENCOUNTER — Ambulatory Visit: Payer: Self-pay | Admitting: Internal Medicine

## 2017-10-28 NOTE — Telephone Encounter (Signed)
Informed pt to go to urgent care or ER

## 2017-10-28 NOTE — Telephone Encounter (Signed)
She should go to ER or urgent care.

## 2017-10-29 DIAGNOSIS — M84375A Stress fracture, left foot, initial encounter for fracture: Secondary | ICD-10-CM | POA: Diagnosis not present

## 2017-10-29 DIAGNOSIS — M79672 Pain in left foot: Secondary | ICD-10-CM | POA: Diagnosis not present

## 2017-10-29 DIAGNOSIS — S93602A Unspecified sprain of left foot, initial encounter: Secondary | ICD-10-CM | POA: Diagnosis not present

## 2017-10-29 DIAGNOSIS — S72114A Nondisplaced fracture of greater trochanter of right femur, initial encounter for closed fracture: Secondary | ICD-10-CM | POA: Diagnosis not present

## 2017-11-08 DIAGNOSIS — M7061 Trochanteric bursitis, right hip: Secondary | ICD-10-CM | POA: Diagnosis not present

## 2017-11-08 DIAGNOSIS — S7001XD Contusion of right hip, subsequent encounter: Secondary | ICD-10-CM | POA: Diagnosis not present

## 2017-11-25 ENCOUNTER — Other Ambulatory Visit: Payer: Self-pay | Admitting: Nurse Practitioner

## 2017-11-25 ENCOUNTER — Telehealth: Payer: Self-pay

## 2017-11-25 DIAGNOSIS — F411 Generalized anxiety disorder: Secondary | ICD-10-CM

## 2017-11-25 DIAGNOSIS — M792 Neuralgia and neuritis, unspecified: Secondary | ICD-10-CM

## 2017-11-25 MED ORDER — HYDROCODONE-ACETAMINOPHEN 7.5-325 MG PO TABS: 1.0000 | ORAL_TABLET | ORAL | 0 refills | Status: DC | PRN

## 2017-11-25 MED ORDER — ALPRAZOLAM 0.25 MG PO TABS: 0.2500 mg | ORAL_TABLET | Freq: Every evening | ORAL | 2 refills | Status: DC | PRN

## 2017-11-25 NOTE — Progress Notes (Signed)
Renewed prescription for alprazolam 0.25mg  at bedtime as needed. Sent #30 with 2 refills. Also sent in hydrocodone/APAP #30 tablets to take as needed. Both sent to [facility].

## 2017-11-25 NOTE — Telephone Encounter (Signed)
lmom 

## 2017-11-25 NOTE — Telephone Encounter (Signed)
Renewed prescription for alprazolam 0.25mg  at bedtime as needed. Sent #30 with 2 refills. Also sent in hydrocodone/APAP #30 tablets to take as needed. Both sent to [facility].

## 2017-11-26 NOTE — Telephone Encounter (Signed)
Pt was called again and notified

## 2017-12-15 ENCOUNTER — Other Ambulatory Visit: Payer: Self-pay

## 2017-12-15 DIAGNOSIS — M792 Neuralgia and neuritis, unspecified: Secondary | ICD-10-CM

## 2017-12-15 MED ORDER — GABAPENTIN 300 MG PO CAPS: ORAL_CAPSULE | ORAL | 1 refills | Status: DC

## 2017-12-15 MED ORDER — METOPROLOL SUCCINATE ER 50 MG PO TB24: 50.0000 mg | ORAL_TABLET | Freq: Every day | ORAL | 1 refills | Status: DC

## 2017-12-15 MED ORDER — ESCITALOPRAM OXALATE 10 MG PO TABS: 10.0000 mg | ORAL_TABLET | Freq: Every evening | ORAL | 0 refills | Status: DC

## 2017-12-21 ENCOUNTER — Ambulatory Visit (INDEPENDENT_AMBULATORY_CARE_PROVIDER_SITE_OTHER): Payer: 59 | Admitting: Nurse Practitioner

## 2017-12-21 ENCOUNTER — Encounter: Payer: Self-pay | Admitting: Nurse Practitioner

## 2017-12-21 VITALS — BP 138/70 | HR 63 | Resp 16 | Ht 64.0 in | Wt 192.8 lb

## 2017-12-21 DIAGNOSIS — E1142 Type 2 diabetes mellitus with diabetic polyneuropathy: Secondary | ICD-10-CM

## 2017-12-21 DIAGNOSIS — I10 Essential (primary) hypertension: Secondary | ICD-10-CM | POA: Diagnosis not present

## 2017-12-21 DIAGNOSIS — M792 Neuralgia and neuritis, unspecified: Secondary | ICD-10-CM

## 2017-12-21 DIAGNOSIS — G43009 Migraine without aura, not intractable, without status migrainosus: Secondary | ICD-10-CM | POA: Diagnosis not present

## 2017-12-21 LAB — POCT GLYCOSYLATED HEMOGLOBIN (HGB A1C): Hemoglobin A1C: 5.3 % (ref 4.0–5.6)

## 2017-12-21 MED ORDER — BUTALBITAL-APAP-CAFFEINE 50-325-40 MG PO TABS: 1.0000 | ORAL_TABLET | Freq: Four times a day (QID) | ORAL | 2 refills | Status: DC | PRN

## 2017-12-21 MED ORDER — GABAPENTIN 300 MG PO CAPS: ORAL_CAPSULE | ORAL | 1 refills | Status: DC

## 2017-12-21 NOTE — Progress Notes (Signed)
[facility] [place], [place] [address]  Internal MEDICINE  Office Visit Note  Patient Name: Wendy Hubbard  [id number]  [id number]  Date of Service: 12/21/2017  Chief Complaint  Patient presents with  . Diabetes    4 month follow up  . Headache    been on and off for 3 weeks  . Quality Metric Gaps    pt goes to eye doctor end of the month,     Patient states that she has been having headaches. She is having them nearly every day for the past 2 weeks. She and her husband believe that her headaches are related to recent family stress.  Diabetes is now controlled without any dm medications. She had gastric bypass surgery and has lost significant weight. Has been able to come off of several medications.       Current Medication: Outpatient Encounter Medications as of 12/21/2017  Medication Sig Note  . ALPRAZolam (XANAX) 0.25 MG tablet Take 1 tablet (0.25 mg total) by mouth at bedtime as needed for anxiety.   . cetirizine (ZYRTEC) 10 MG tablet Take 10 mg by mouth daily.   . Dulaglutide (TRULICITY) 4.09 BD/5.3GD SOPN Inject into the skin.   Marland Kitchen escitalopram (LEXAPRO) 10 MG tablet Take 1 tablet (10 mg total) by mouth every evening.   . gabapentin (NEURONTIN) 300 MG capsule Take 1 capsule po BID. Then take 2 capsules po QHS. (four capsules daily).   Marland Kitchen HYDROcodone-acetaminophen (NORCO) 7.5-325 MG tablet Take 1 tablet by mouth every 4 (four) hours as needed for moderate pain.   Marland Kitchen levofloxacin (LEVAQUIN) 500 MG tablet Take 1 tab po daily for 10 days   . metoprolol succinate (TOPROL-XL) 50 MG 24 hr tablet Take 1 tablet (50 mg total) by mouth at bedtime. Take with or immediately following a meal.   . pantoprazole (PROTONIX) 40 MG tablet Take 40 mg by mouth daily.   . phenazopyridine (PYRIDIUM) 200 MG tablet Take 1 tablet (200 mg total) by mouth 3 (three) times daily as needed for pain.   Marland Kitchen spironolactone (ALDACTONE) 50 MG tablet Take 50 mg by mouth daily. 08/22/2014: .    Marland Kitchen tiZANidine (ZANAFLEX) 4 MG tablet Take 1 tablet at bedtime   . [DISCONTINUED] gabapentin (NEURONTIN) 300 MG capsule Take 1 capsule po BID. Take 2 capsules po QHS   . butalbital-acetaminophen-caffeine (FIORICET, ESGIC) 50-325-40 MG tablet Take 1-2 tablets by mouth every 6 (six) hours as needed for headache.    No facility-administered encounter medications on file as of 12/21/2017.     Surgical History: Past Surgical History:  Procedure Laterality Date  . ABDOMINAL HYSTERECTOMY    . ANKLE ARTHROSCOPY Bilateral   . APPENDECTOMY    . BLADDER SUSPENSION    . BREAST EXCISIONAL BIOPSY Left 1990   neg  . BREAST EXCISIONAL BIOPSY Right 1989   neg  . BREAST SURGERY    . CHOLECYSTECTOMY    . COLONOSCOPY N/A 11/04/2015   Procedure: COLONOSCOPY;  Surgeon: Manya Silvas, MD;  Location[facility] ENDOSCOPY;  Service: Endoscopy;  Laterality: N/A;  . ESOPHAGOGASTRODUODENOSCOPY N/A 11/02/2015   Procedure: ESOPHAGOGASTRODUODENOSCOPY (EGD);  Surgeon: Manya Silvas, MD;  Location: [facility] ENDOSCOPY;  Service: Endoscopy;  Laterality: N/A;  . KNEE ARTHROSCOPY Left   . REDUCTION MAMMAPLASTY Bilateral 1988  . TONSILLECTOMY      Medical History: Past Medical History:  Diagnosis Date  . COPD (chronic obstructive pulmonary disease) ([place])   . Diabetes mellitus without complication ([place])   .  GERD (gastroesophageal reflux disease)   . Hyperlipidemia   . Hypertension   . Rapid heart rate   . Sleep apnea     Family History: Family History  Problem Relation Age of Onset  . Hypertension Other   . Bladder Cancer Mother   . Kidney cancer Neg Hx   . Prostate cancer Neg Hx     Social History   Socioeconomic History  . Marital status: Married    Spouse name: Not on file  . Number of children: Not on file  . Years of education: Not on file  . Highest education level: Not on file  Occupational History  . Not on file  Social Needs  . Financial resource strain: Not on file  . Food insecurity:     Worry: Not on file    Inability: Not on file  . Transportation needs:    Medical: Not on file    Non-medical: Not on file  Tobacco Use  . Smoking status: Former Research scientist (life sciences)  . Smokeless tobacco: Never Used  Substance and Sexual Activity  . Alcohol use: Yes    Alcohol/week: 0.0 standard drinks    Comment: occasional  . Drug use: No  . Sexual activity: Not on file  Lifestyle  . Physical activity:    Days per week: Not on file    Minutes per session: Not on file  . Stress: Not on file  Relationships  . Social connections:    Talks on phone: Not on file    Gets together: Not on file    Attends religious service: Not on file    Active member of club or organization: Not on file    Attends meetings of clubs or organizations: Not on file    Relationship status: Not on file  . Intimate partner violence:    Fear of current or ex partner: Not on file    Emotionally abused: Not on file    Physically abused: Not on file    Forced sexual activity: Not on file  Other Topics Concern  . Not on file  Social History Narrative  . Not on file      Review of Systems  Constitutional: Negative for chills, diaphoresis, fatigue and unexpected weight change.  HENT: Negative for ear pain, postnasal drip and sinus pressure.   Eyes: Negative.  Negative for photophobia, discharge, redness, itching and visual disturbance.  Respiratory: Negative for cough (with blood ), shortness of breath and wheezing.   Cardiovascular: Negative for chest pain, palpitations and leg swelling.  Gastrointestinal: Negative for abdominal pain, constipation, diarrhea, nausea and vomiting.  Endocrine:       Blood sugars controlled through diet alone.   Genitourinary: Negative.  Negative for dysuria and flank pain.  Musculoskeletal: Negative for arthralgias, back pain, gait problem and neck pain.  Skin: Negative for color change and rash.  Allergic/Immunologic: Negative for environmental allergies and food allergies.   Neurological: Positive for headaches. Negative for dizziness.  Hematological: Negative for adenopathy. Does not bruise/bleed easily.  Psychiatric/Behavioral: Negative for agitation, behavioral problems (depression) and hallucinations. The patient is nervous/anxious.     Today's Vitals   12/21/17 0850  BP: 138/70  Pulse: 63  Resp: 16  SpO2: 98%  Weight: 192 lb 12.8 oz (87.5 kg)  Height: 5\' 4"  (1.626 m)    Physical Exam  Constitutional: She is oriented to person, place, and time. She appears well-developed and well-nourished. No distress.  HENT:  Head: Normocephalic and atraumatic.  Mouth/Throat: Oropharynx  is clear and moist. No oropharyngeal exudate.  Eyes: Pupils are equal, round, and reactive to light. EOM are normal.  Neck: Normal range of motion. Neck supple. No JVD present. No tracheal deviation present. No thyromegaly present.  Cardiovascular: Normal rate, regular rhythm and normal heart sounds. Exam reveals no gallop and no friction rub.  No murmur heard. Pulmonary/Chest: Effort normal. No respiratory distress. She has no wheezes. She has no rales. She exhibits no tenderness.  Abdominal: Soft. Bowel sounds are normal.  Musculoskeletal: Normal range of motion.  Lymphadenopathy:    She has no cervical adenopathy.  Neurological: She is alert and oriented to person, place, and time. She has normal strength. No cranial nerve deficit. She displays a negative Romberg sign.  Skin: Skin is warm and dry. Capillary refill takes less than 2 seconds. She is not diaphoretic.  Psychiatric: Her speech is normal and behavior is normal. Judgment and thought content normal. Her mood appears anxious. Cognition and memory are normal.  Nursing note and vitals reviewed.  Assessment/Plan: 1. Type 2 diabetes mellitus with peripheral neuropathy (HCC) - POCT HgB A1C 5.2 and controlled without dm medications. Continue dietary and lifestyle changes to control blood sugars. continue gabapentin as  prescribed.   2. Migraine without aura and without status migrainosus, not intractable Add fioricet to take as needed for acute headaches. Advised to use with caution, as they may cause dizziness or drowsiness.  - butalbital-acetaminophen-caffeine (FIORICET, ESGIC) 50-325-40 MG tablet; Take 1-2 tablets by mouth every 6 (six) hours as needed for headache.  Dispense: 60 tablet; Refill: 2  3. Neuralgia and neuritis, unspecified Gabapentin should be continued as prescribed. Taking 1 in AM and lunchtime. Taking  2 at bedtime.  - gabapentin (NEURONTIN) 300 MG capsule; Take 1 capsule po BID. Then take 2 capsules po QHS. (four capsules daily).  Dispense: 360 capsule; Refill: 1  4. Essential hypertension Stable.   General Counseling: Wendelyn verbalizes understanding of the findings of todays visit and agrees with plan of treatment. I have discussed any further diagnostic evaluation that may be needed or ordered today. We also reviewed her medications today. she has been encouraged to call the office with any questions or concerns that should arise related to todays visit.  Hypertension Counseling:   The following hypertensive lifestyle modification were recommended and discussed:  1. Limiting alcohol intake to less than 1 oz/day of ethanol:(24 oz of beer or 8 oz of wine or 2 oz of 100-proof whiskey). 2. Take baby ASA 81 mg daily. 3. Importance of regular aerobic exercise and losing weight. 4. Reduce dietary saturated fat and cholesterol intake for overall cardiovascular health. 5. Maintaining adequate dietary potassium, calcium, and magnesium intake. 6. Regular monitoring of the blood pressure. 7. Reduce sodium intake to less than 100 mmol/day (less than 2.3 gm of sodium or less than 6 gm of sodium choride)   This patient was seen by [place] with Dr Lavera Guise as a part of collaborative care agreement  Orders Placed This Encounter  Procedures  . POCT HgB A1C    Meds  ordered this encounter  Medications  . butalbital-acetaminophen-caffeine (FIORICET, ESGIC) 50-325-40 MG tablet    Sig: Take 1-2 tablets by mouth every 6 (six) hours as needed for headache.    Dispense:  60 tablet    Refill:  2    Order Specific Question:   Supervising Provider    Answer:   Lavera Guise [[id number]]  . gabapentin (NEURONTIN) 300 MG  capsule    Sig: Take 1 capsule po BID. Then take 2 capsules po QHS. (four capsules daily).    Dispense:  360 capsule    Refill:  1    Increased dose at night    Order Specific Question:   Supervising Provider    Answer:   Lavera Guise [[id number]]    Time spent: 18 Minutes    Dr Lavera Guise Internal medicine

## 2017-12-22 ENCOUNTER — Other Ambulatory Visit: Payer: Self-pay

## 2017-12-22 MED ORDER — TIZANIDINE HCL 4 MG PO TABS: ORAL_TABLET | ORAL | 1 refills | Status: DC

## 2017-12-28 DIAGNOSIS — H35373 Puckering of macula, bilateral: Secondary | ICD-10-CM | POA: Diagnosis not present

## 2017-12-28 DIAGNOSIS — H538 Other visual disturbances: Secondary | ICD-10-CM | POA: Diagnosis not present

## 2017-12-28 DIAGNOSIS — H04129 Dry eye syndrome of unspecified lacrimal gland: Secondary | ICD-10-CM | POA: Diagnosis not present

## 2018-02-08 DIAGNOSIS — H04212 Epiphora due to excess lacrimation, left lacrimal gland: Secondary | ICD-10-CM | POA: Diagnosis not present

## 2018-02-14 ENCOUNTER — Other Ambulatory Visit: Payer: Self-pay | Admitting: Nurse Practitioner

## 2018-02-14 MED ORDER — TIZANIDINE HCL 4 MG PO TABS: ORAL_TABLET | ORAL | 1 refills | Status: DC

## 2018-02-28 ENCOUNTER — Ambulatory Visit (INDEPENDENT_AMBULATORY_CARE_PROVIDER_SITE_OTHER): Payer: 59 | Admitting: Nurse Practitioner

## 2018-02-28 ENCOUNTER — Encounter: Payer: Self-pay | Admitting: Nurse Practitioner

## 2018-02-28 VITALS — BP 158/68 | HR 78 | Temp 98.8°F | Resp 16 | Ht 64.0 in | Wt 198.0 lb

## 2018-02-28 DIAGNOSIS — J069 Acute upper respiratory infection, unspecified: Secondary | ICD-10-CM | POA: Insufficient documentation

## 2018-02-28 DIAGNOSIS — J301 Allergic rhinitis due to pollen: Secondary | ICD-10-CM

## 2018-02-28 DIAGNOSIS — J029 Acute pharyngitis, unspecified: Secondary | ICD-10-CM | POA: Insufficient documentation

## 2018-02-28 DIAGNOSIS — R05 Cough: Secondary | ICD-10-CM

## 2018-02-28 DIAGNOSIS — R059 Cough, unspecified: Secondary | ICD-10-CM | POA: Insufficient documentation

## 2018-02-28 LAB — POC INFLUENZA TEST: Negative: NEGATIVE

## 2018-02-28 LAB — POCT RAPID STREP A (OFFICE): Rapid Strep A Screen: NEGATIVE

## 2018-02-28 MED ORDER — MONTELUKAST SODIUM 10 MG PO TABS: 10.0000 mg | ORAL_TABLET | Freq: Every day | ORAL | 3 refills | Status: DC

## 2018-02-28 MED ORDER — HYDROCOD POLST-CPM POLST ER 10-8 MG/5ML PO SUER: 5.0000 mL | Freq: Two times a day (BID) | ORAL | 0 refills | Status: DC | PRN

## 2018-02-28 MED ORDER — SULFAMETHOXAZOLE-TRIMETHOPRIM 800-160 MG PO TABS: 1.0000 | ORAL_TABLET | Freq: Two times a day (BID) | ORAL | 0 refills | Status: DC

## 2018-02-28 NOTE — Progress Notes (Signed)
[facility] [place], [place] [address]  Internal MEDICINE  Office Visit Note  Patient Name: Wendy Hubbard  [id number]  [id number]  Date of Service: 02/28/2018   Pt is here for a sick visit.  Chief Complaint  Patient presents with  . Fever    pt had had low grade fevever since wednesday and has been taking OTC medication and it is not working.   . Cough    symptoms since wednesday, mucous is clear at times and them its yellow, chest congestion  . Sore Throat    body aches and chills  . Chills     Cough  This is a chronic problem. The current episode started more than 1 year ago. The problem has been gradually worsening. The problem occurs every few minutes. The cough is non-productive. Associated symptoms include chills, ear congestion, ear pain, a fever, headaches, myalgias, nasal congestion, postnasal drip, rhinorrhea, a sore throat and shortness of breath. Pertinent negatives include no chest pain or wheezing. She has tried rest, OTC cough suppressant and body position changes for the symptoms. The treatment provided mild relief. Her past medical history is significant for asthma and environmental allergies.     Current Medication:  Outpatient Encounter Medications as of 02/28/2018  Medication Sig Note  . ALPRAZolam (XANAX) 0.25 MG tablet Take 1 tablet (0.25 mg total) by mouth at bedtime as needed for anxiety.   . butalbital-acetaminophen-caffeine (FIORICET, ESGIC) 50-325-40 MG tablet Take 1-2 tablets by mouth every 6 (six) hours as needed for headache.   . cetirizine (ZYRTEC) 10 MG tablet Take 10 mg by mouth daily.   . Dulaglutide (TRULICITY) 8.92 JJ/9.4RD SOPN Inject into the skin.   Marland Kitchen escitalopram (LEXAPRO) 10 MG tablet Take 1 tablet (10 mg total) by mouth every evening.   . gabapentin (NEURONTIN) 300 MG capsule Take 1 capsule po BID. Then take 2 capsules po QHS. (four capsules daily).   Marland Kitchen HYDROcodone-acetaminophen (NORCO) 7.5-325 MG tablet Take  1 tablet by mouth every 4 (four) hours as needed for moderate pain.   Marland Kitchen levofloxacin (LEVAQUIN) 500 MG tablet Take 1 tab po daily for 10 days   . metoprolol succinate (TOPROL-XL) 50 MG 24 hr tablet Take 1 tablet (50 mg total) by mouth at bedtime. Take with or immediately following a meal.   . pantoprazole (PROTONIX) 40 MG tablet Take 40 mg by mouth daily.   . phenazopyridine (PYRIDIUM) 200 MG tablet Take 1 tablet (200 mg total) by mouth 3 (three) times daily as needed for pain.   Marland Kitchen spironolactone (ALDACTONE) 50 MG tablet Take 50 mg by mouth daily. 08/22/2014: .   Marland Kitchen tiZANidine (ZANAFLEX) 4 MG tablet Take 1 tablet at bedtime   . chlorpheniramine-HYDROcodone (TUSSIONEX PENNKINETIC ER) 10-8 MG/5ML SUER Take 5 mLs by mouth every 12 (twelve) hours as needed for cough.   . montelukast (SINGULAIR) 10 MG tablet Take 1 tablet (10 mg total) by mouth at bedtime.   . sulfamethoxazole-trimethoprim (BACTRIM DS,SEPTRA DS) 800-160 MG tablet Take 1 tablet by mouth 2 (two) times daily.    No facility-administered encounter medications on file as of 02/28/2018.       Medical History: Past Medical History:  Diagnosis Date  . COPD (chronic obstructive pulmonary disease) ([place])   . Diabetes mellitus without complication ([place])   . GERD (gastroesophageal reflux disease)   . Hyperlipidemia   . Hypertension   . Rapid heart rate   . Sleep apnea     Today's Vitals  02/28/18 1557  BP: (!) 158/68  Pulse: 78  Resp: 16  Temp: 98.8 F (37.1 C)  SpO2: 98%  Weight: 198 lb (89.8 kg)  Height: 5\' 4"  (1.626 m)    Review of Systems  Constitutional: Positive for activity change, chills, fatigue and fever.  HENT: Positive for congestion, ear pain, postnasal drip, rhinorrhea and sore throat.   Eyes: Negative.   Respiratory: Positive for cough and shortness of breath. Negative for wheezing.   Cardiovascular: Negative for chest pain and palpitations.       Elevated blood pressure  Gastrointestinal: Positive for  nausea. Negative for vomiting.  Musculoskeletal: Positive for myalgias.  Allergic/Immunologic: Positive for environmental allergies.  Neurological: Positive for headaches.  Hematological: Negative for adenopathy.    Physical Exam  Constitutional: She is oriented to person, place, and time. She appears well-developed and well-nourished. No distress.  HENT:  Head: Normocephalic and atraumatic.  Right Ear: Tympanic membrane is bulging.  Left Ear: Tympanic membrane is bulging.  Nose: Rhinorrhea and sinus tenderness present. Right sinus exhibits maxillary sinus tenderness. Left sinus exhibits maxillary sinus tenderness.  Mouth/Throat: Posterior oropharyngeal erythema present. No oropharyngeal exudate.  Eyes: Pupils are equal, round, and reactive to light. EOM are normal.  Excessive eye watering  Neck: Normal range of motion. Neck supple. No JVD present. No tracheal deviation present. No thyromegaly present.  Cardiovascular: Normal rate, regular rhythm and normal heart sounds. Exam reveals no gallop and no friction rub.  No murmur heard. Pulmonary/Chest: Effort normal and breath sounds normal. No respiratory distress. She has no wheezes. She has no rales. She exhibits no tenderness.  Congested, harsh, non-productive cough present.   Abdominal: Soft. Bowel sounds are normal.  Musculoskeletal: Normal range of motion.  Lymphadenopathy:    She has no cervical adenopathy.  Neurological: She is alert and oriented to person, place, and time. No cranial nerve deficit.  Skin: Skin is warm and dry. She is not diaphoretic.  Psychiatric: She has a normal mood and affect. Her behavior is normal. Judgment and thought content normal.  Nursing note and vitals reviewed.  Assessment/Plan:  1. Acute upper respiratory infection Negative flu. Start bactrim DS bid for 10 days. Rest and increase fluids. OTC medications to alleviate symptoms.  - sulfamethoxazole-trimethoprim (BACTRIM DS,SEPTRA DS) 800-160 MG  tablet; Take 1 tablet by mouth 2 (two) times daily.  Dispense: 20 tablet; Refill: 0  2. Cough tussionex may be taken twice daily if needed for cough. Use with caution as medication may cause dizziness or drowsiness.  - chlorpheniramine-HYDROcodone (TUSSIONEX PENNKINETIC ER) 10-8 MG/5ML SUER; Take 5 mLs by mouth every 12 (twelve) hours as needed for cough.  Dispense: 115 mL; Refill: 0  3. Sore throat Negative flu and strep. Antibiotics as prescribed. Gargle with warm salt [place] to help sore throat.  - POCT rapid strep A - POC INFLUENZA TEST  4. Seasonal allergic rhinitis due to pollen Recommend she change zyrtec to OTC allegra every day. Add singulair 10mg  daily. - montelukast (SINGULAIR) 10 MG tablet; Take 1 tablet (10 mg total) by mouth at bedtime.  Dispense: 30 tablet; Refill: 3  General Counseling: Jurni verbalizes understanding of the findings of todays visit and agrees with plan of treatment. I have discussed any further diagnostic evaluation that may be needed or ordered today. We also reviewed her medications today. she has been encouraged to call the office with any questions or concerns that should arise related to todays visit.    Counseling:  Rest and increase fluids.  Continue using OTC medication to control symptoms.   This patient was seen by Leretha Pol FNP Collaboration with Dr Lavera Guise as a part of collaborative care agreement  Orders Placed This Encounter  Procedures  . POCT rapid strep A  . POC INFLUENZA TEST    Meds ordered this encounter  Medications  . sulfamethoxazole-trimethoprim (BACTRIM DS,SEPTRA DS) 800-160 MG tablet    Sig: Take 1 tablet by mouth 2 (two) times daily.    Dispense:  20 tablet    Refill:  0    Order Specific Question:   Supervising Provider    Answer:   Lavera Guise [[id number]]  . chlorpheniramine-HYDROcodone (TUSSIONEX PENNKINETIC ER) 10-8 MG/5ML SUER    Sig: Take 5 mLs by mouth every 12 (twelve) hours as needed for cough.     Dispense:  115 mL    Refill:  0    Order Specific Question:   Supervising Provider    Answer:   Lavera Guise [[id number]]  . montelukast (SINGULAIR) 10 MG tablet    Sig: Take 1 tablet (10 mg total) by mouth at bedtime.    Dispense:  30 tablet    Refill:  3    Order Specific Question:   Supervising Provider    Answer:   Lavera Guise [[id number]]    Time spent: 25 Minutes

## 2018-03-08 ENCOUNTER — Other Ambulatory Visit: Payer: Self-pay | Admitting: Nurse Practitioner

## 2018-03-08 ENCOUNTER — Telehealth: Payer: Self-pay

## 2018-03-08 DIAGNOSIS — J069 Acute upper respiratory infection, unspecified: Secondary | ICD-10-CM

## 2018-03-08 DIAGNOSIS — R05 Cough: Secondary | ICD-10-CM

## 2018-03-08 DIAGNOSIS — R059 Cough, unspecified: Secondary | ICD-10-CM

## 2018-03-08 MED ORDER — LEVOFLOXACIN 500 MG PO TABS: 500.0000 mg | ORAL_TABLET | Freq: Every day | ORAL | 0 refills | Status: DC

## 2018-03-08 MED ORDER — METHYLPREDNISOLONE 4 MG PO TBPK: ORAL_TABLET | ORAL | 0 refills | Status: DC

## 2018-03-08 NOTE — Progress Notes (Signed)
Patient called the office stating she was still sick after initial round antibiotics. Will do trial of levofloxacin 500mg  daily for 10 days and and medrol taper. Both ere sent to her pharmacy. I have also put in order for chest x-ray. She can go anytime.

## 2018-03-08 NOTE — Telephone Encounter (Signed)
Patient called the office stating she was still sick after initial round antibiotics. Will do trial of levofloxacin 500mg  daily for 10 days and and medrol taper. Both ere sent to her pharmacy. I have also put in order for chest x-ray. She can go anytime.

## 2018-03-09 NOTE — Telephone Encounter (Signed)
Pt advised that we send antibiotic and also put order for chest xray

## 2018-03-16 ENCOUNTER — Other Ambulatory Visit: Payer: Self-pay | Admitting: Nurse Practitioner

## 2018-03-24 ENCOUNTER — Telehealth: Payer: Self-pay

## 2018-03-24 MED ORDER — ESCITALOPRAM OXALATE 10 MG PO TABS: 10.0000 mg | ORAL_TABLET | Freq: Every evening | ORAL | 1 refills | Status: DC

## 2018-03-24 NOTE — Telephone Encounter (Signed)
Send pres to phar  

## 2018-04-13 ENCOUNTER — Other Ambulatory Visit: Payer: Self-pay

## 2018-04-13 MED ORDER — TIZANIDINE HCL 4 MG PO TABS: ORAL_TABLET | ORAL | 1 refills | Status: DC

## 2018-04-29 ENCOUNTER — Ambulatory Visit (INDEPENDENT_AMBULATORY_CARE_PROVIDER_SITE_OTHER): Payer: 59 | Admitting: Nurse Practitioner

## 2018-04-29 ENCOUNTER — Encounter: Payer: Self-pay | Admitting: Nurse Practitioner

## 2018-04-29 VITALS — BP 145/72 | HR 69 | Resp 16 | Ht 64.0 in | Wt 199.0 lb

## 2018-04-29 DIAGNOSIS — K580 Irritable bowel syndrome with diarrhea: Secondary | ICD-10-CM

## 2018-04-29 DIAGNOSIS — R319 Hematuria, unspecified: Secondary | ICD-10-CM

## 2018-04-29 DIAGNOSIS — M792 Neuralgia and neuritis, unspecified: Secondary | ICD-10-CM

## 2018-04-29 DIAGNOSIS — R300 Dysuria: Secondary | ICD-10-CM

## 2018-04-29 DIAGNOSIS — N390 Urinary tract infection, site not specified: Secondary | ICD-10-CM

## 2018-04-29 LAB — POCT URINALYSIS DIPSTICK
Bilirubin, UA: NEGATIVE
Glucose, UA: NEGATIVE
Ketones, UA: NEGATIVE
Nitrite, UA: NEGATIVE
Protein, UA: NEGATIVE
Spec Grav, UA: 1.01 (ref 1.010–1.025)
Urobilinogen, UA: 0.2 E.U./dL
pH, UA: 5 (ref 5.0–8.0)

## 2018-04-29 MED ORDER — SULFAMETHOXAZOLE-TRIMETHOPRIM 800-160 MG PO TABS: 1.0000 | ORAL_TABLET | Freq: Two times a day (BID) | ORAL | 0 refills | Status: DC

## 2018-04-29 MED ORDER — DIPHENOXYLATE-ATROPINE 2.5-0.025 MG PO TABS: 1.0000 | ORAL_TABLET | Freq: Four times a day (QID) | ORAL | 1 refills | Status: DC | PRN

## 2018-04-29 MED ORDER — DICYCLOMINE HCL 10 MG PO CAPS: 10.0000 mg | ORAL_CAPSULE | Freq: Three times a day (TID) | ORAL | 2 refills | Status: DC

## 2018-04-29 MED ORDER — PHENAZOPYRIDINE HCL 200 MG PO TABS: 200.0000 mg | ORAL_TABLET | Freq: Three times a day (TID) | ORAL | 0 refills | Status: DC | PRN

## 2018-04-29 MED ORDER — HYDROCODONE-ACETAMINOPHEN 7.5-325 MG PO TABS: 1.0000 | ORAL_TABLET | ORAL | 0 refills | Status: DC | PRN

## 2018-04-29 NOTE — Progress Notes (Signed)
[facility] [place], [place] [address]  Internal MEDICINE  Office Visit Note  Wendy Hubbard Name: Wendy Hubbard  [id number]  [id number]  Date of Service: 05/01/2018   Pt is here for a sick visit.  Chief Complaint  Wendy Hubbard presents with  . Urinary Tract Infection    possible UTI, burning and pain when urinating been going on since wednesdsay     Wendy Hubbard is here for sick visit. Wendy Hubbard has been having pain with urination and hematuria for past two days. Prior to this, Wendy Hubbard has been having a lot of abdominal spasms and diarrhea. Diarrhea can be so bad, Wendy Hubbard is having incontinence of stool in bed. This started about 1.5 weeks ago. Started while Wendy Hubbard was on a trip to [place]. Has no abdominal pain or cramping. Just gets urge to have bowel movement and severe urgency to use Wendy bathroom.  Wendy Hubbard is also having intermittent neuropathy in Wendy Hubbard feet. When acting up, Wendy pain in Wendy Hubbard feet is so bad, Wendy Hubbard can't even have a sheet touch Wendy Hubbard feet. On those days, Wendy Hubbard has been taking hydrocodone/APAP 7.5/325mg  tablets which relieves Wendy pain enough for Wendy Hubbard to get rest. Wendy Hubbard would like to have a refill for this today.        Current Medication:  Outpatient Encounter Medications as of 04/29/2018  Medication Sig Note  . ALPRAZolam (XANAX) 0.25 MG tablet Take 1 tablet (0.25 mg total) by mouth at bedtime as needed for anxiety.   . butalbital-acetaminophen-caffeine (FIORICET, ESGIC) 50-325-40 MG tablet Take 1-2 tablets by mouth every 6 (six) hours as needed for headache.   . cetirizine (ZYRTEC) 10 MG tablet Take 10 mg by mouth daily.   . Dulaglutide (TRULICITY) 5.02 DX/4.1OI SOPN Inject into Wendy skin.   Marland Kitchen escitalopram (LEXAPRO) 10 MG tablet Take 1 tablet (10 mg total) by mouth every evening.   . gabapentin (NEURONTIN) 300 MG capsule Take 1 capsule po BID. Then take 2 capsules po QHS. (four capsules daily).   Marland Kitchen HYDROcodone-acetaminophen (NORCO) 7.5-325 MG tablet Take 1 tablet by mouth every  4 (four) hours as needed for moderate pain.   Marland Kitchen levofloxacin (LEVAQUIN) 500 MG tablet Take 1 tablet (500 mg total) by mouth daily.   . methylPREDNISolone (MEDROL) 4 MG TBPK tablet Take by mouth as directed for 6 days   . metoprolol succinate (TOPROL-XL) 50 MG 24 hr tablet Take 1 tablet (50 mg total) by mouth at bedtime. Take with or immediately following a meal.   . montelukast (SINGULAIR) 10 MG tablet Take 1 tablet (10 mg total) by mouth at bedtime.   . pantoprazole (PROTONIX) 40 MG tablet Take 40 mg by mouth daily.   Marland Kitchen spironolactone (ALDACTONE) 50 MG tablet Take 50 mg by mouth daily. 08/22/2014: .   Marland Kitchen tiZANidine (ZANAFLEX) 4 MG tablet TAKE 1 TABLET BY MOUTH EVERYDAY AT BEDTIME   . [DISCONTINUED] chlorpheniramine-HYDROcodone (TUSSIONEX PENNKINETIC ER) 10-8 MG/5ML SUER Take 5 mLs by mouth every 12 (twelve) hours as needed for cough.   . [DISCONTINUED] HYDROcodone-acetaminophen (NORCO) 7.5-325 MG tablet Take 1 tablet by mouth every 4 (four) hours as needed for moderate pain.   . [DISCONTINUED] phenazopyridine (PYRIDIUM) 200 MG tablet Take 1 tablet (200 mg total) by mouth 3 (three) times daily as needed for pain.   Marland Kitchen dicyclomine (BENTYL) 10 MG capsule Take 1 capsule (10 mg total) by mouth 4 (four) times daily -  before meals and at bedtime.   . diphenoxylate-atropine (LOMOTIL) 2.5-0.025 MG tablet Take  1 tablet by mouth 4 (four) times daily as needed for diarrhea or loose stools.   . phenazopyridine (PYRIDIUM) 200 MG tablet Take 1 tablet (200 mg total) by mouth 3 (three) times daily as needed for pain.   Marland Kitchen sulfamethoxazole-trimethoprim (BACTRIM DS,SEPTRA DS) 800-160 MG tablet Take 1 tablet by mouth 2 (two) times daily.   . [DISCONTINUED] sulfamethoxazole-trimethoprim (BACTRIM DS,SEPTRA DS) 800-160 MG tablet Take 1 tablet by mouth 2 (two) times daily. (Wendy Hubbard not taking: Reported on 04/29/2018)    No facility-administered encounter medications on file as of 04/29/2018.       Medical History: Past  Medical History:  Diagnosis Date  . COPD (chronic obstructive pulmonary disease) ([place])   . Diabetes mellitus without complication ([place])   . GERD (gastroesophageal reflux disease)   . Hyperlipidemia   . Hypertension   . Rapid heart rate   . Sleep apnea     Today's Vitals   04/29/18 0927  BP: (!) 145/72  Pulse: 69  Resp: 16  SpO2: 98%  Weight: 199 lb (90.3 kg)  Height: 5\' 4"  (1.626 m)    Review of Systems  Constitutional: Positive for fatigue. Negative for chills, diaphoresis and unexpected weight change.  HENT: Negative for ear pain, postnasal drip and sinus pressure.   Respiratory: Negative for cough (with blood ), shortness of breath and wheezing.   Cardiovascular: Negative for chest pain, palpitations and leg swelling.  Gastrointestinal: Positive for diarrhea. Negative for abdominal pain, constipation, nausea and vomiting.       Intestinal spasms  Endocrine: Negative for polydipsia and polyuria.  Genitourinary: Positive for dysuria, frequency, hematuria and urgency. Negative for flank pain.  Musculoskeletal: Negative for arthralgias, back pain, gait problem and neck pain.       Neuropathy in both feet .  Skin: Negative for color change and rash.  Allergic/Immunologic: Negative for environmental allergies and food allergies.  Neurological: Positive for headaches. Negative for dizziness.  Hematological: Negative for adenopathy. Does not bruise/bleed easily.  Psychiatric/Behavioral: Negative for agitation, behavioral problems (depression) and hallucinations. Wendy Hubbard is not nervous/anxious.     Physical Exam Vitals signs and nursing note reviewed.  Constitutional:      General: Wendy Hubbard is not in acute distress.    Appearance: Normal appearance. Wendy Hubbard is well-developed. Wendy Hubbard is not diaphoretic.  HENT:     Head: Normocephalic and atraumatic.     Nose: Nose normal.     Mouth/Throat:     Pharynx: No oropharyngeal exudate.  Eyes:     Pupils: Pupils are equal, round, and  reactive to light.  Neck:     Musculoskeletal: Normal range of motion and neck supple.     Thyroid: No thyromegaly.     Vascular: No JVD.     Trachea: No tracheal deviation.  Cardiovascular:     Rate and Rhythm: Normal rate and regular rhythm.     Heart sounds: Normal heart sounds. No murmur. No friction rub. No gallop.   Pulmonary:     Effort: Pulmonary effort is normal. No respiratory distress.     Breath sounds: Normal breath sounds. No wheezing or rales.  Chest:     Chest wall: No tenderness.  Abdominal:     General: Bowel sounds are normal.     Palpations: Abdomen is soft.     Tenderness: There is abdominal tenderness.     Comments: Mild, suprapubic tenderness with palpation.   Genitourinary:    Comments: Urine sample positive for large WBC and moderate blood.  Musculoskeletal: Normal range of motion.  Lymphadenopathy:     Cervical: No cervical adenopathy.  Skin:    General: Skin is warm and dry.  Neurological:     General: No focal deficit present.     Mental Status: Wendy Hubbard is alert and oriented to person, place, and time.     Cranial Nerves: No cranial nerve deficit.  Psychiatric:        Behavior: Behavior normal.        Thought Content: Thought content normal.        Judgment: Judgment normal.   Assessment/Plan: 1. Urinary tract infection with hematuria, site unspecified Start bactrim DS bid for 10 days. Send urine for culture and sensitivity and adjust antibiotics as indicated.  - sulfamethoxazole-trimethoprim (BACTRIM DS,SEPTRA DS) 800-160 MG tablet; Take 1 tablet by mouth 2 (two) times daily.  Dispense: 20 tablet; Refill: 0 - CULTURE, URINE COMPREHENSIVE  2. Dysuria - POCT Urinalysis Dipstick - phenazopyridine (PYRIDIUM) 200 MG tablet; Take 1 tablet (200 mg total) by mouth 3 (three) times daily as needed for pain.  Dispense: 10 tablet; Refill: 0  3. Neuralgia and neuritis, unspecified May take hydrocodone/APAP 7.5/325mg  tablet at night as needed for  neuropathy/foot pain. New prescrption for #30 tablets sent to Wendy Hubbard pharmacy.  - HYDROcodone-acetaminophen (NORCO) 7.5-325 MG tablet; Take 1 tablet by mouth every 4 (four) hours as needed for moderate pain.  Dispense: 30 tablet; Refill: 0  4. Irritable bowel syndrome with diarrhea Start dycyclomine 10mg  up to three times daily as needed for intestinal cramping and diarrhea. May take lomotil up to four times daily if needed for significant diarrhea.  - dicyclomine (BENTYL) 10 MG capsule; Take 1 capsule (10 mg total) by mouth 4 (four) times daily -  before meals and at bedtime.  Dispense: 90 capsule; Refill: 2 - diphenoxylate-atropine (LOMOTIL) 2.5-0.025 MG tablet; Take 1 tablet by mouth 4 (four) times daily as needed for diarrhea or loose stools.  Dispense: 30 tablet; Refill: 1  General Counseling: Wendy Hubbard verbalizes understanding of Wendy findings of todays visit and agrees with plan of treatment. I have discussed any further diagnostic evaluation that may be needed or ordered today. We also reviewed Wendy Hubbard medications today. Wendy Hubbard has been encouraged to call Wendy office with any questions or concerns that should arise related to todays visit.    Counseling:  Rest and increase fluids. Continue using OTC medication to control symptoms.   Wendy 'BRAT' diet is suggested, then progress to diet as tolerated as symptoms abate. Call if bloody stools, persistent diarrhea, vomiting, fever or abdominal pain.  This Wendy Hubbard was seen by Leretha Pol FNP Collaboration with Dr Lavera Guise as a part of collaborative care agreement  Reviewed risks and possible side effects associated with taking opiates, benzodiazepines and other CNS depressants. Combination of these could cause dizziness and drowsiness. Advised Wendy Hubbard not to drive or operate machinery when taking these medications, as Wendy Hubbard's and other's life can be at risk and will have consequences. Wendy Hubbard verbalized understanding in this matter. Dependence and abuse  for these drugs will be monitored closely. A Controlled substance policy and procedure is on file which allows [facility] to order a urine drug screen test at any visit. Wendy Hubbard understands and agrees with Wendy plan  Orders Placed This Encounter  Procedures  . CULTURE, URINE COMPREHENSIVE  . POCT Urinalysis Dipstick    Meds ordered this encounter  Medications  . sulfamethoxazole-trimethoprim (BACTRIM DS,SEPTRA DS) 800-160 MG tablet    Sig: Take 1  tablet by mouth 2 (two) times daily.    Dispense:  20 tablet    Refill:  0    Order Specific Question:   Supervising Provider    Answer:   Lavera Guise [[id number]]  . phenazopyridine (PYRIDIUM) 200 MG tablet    Sig: Take 1 tablet (200 mg total) by mouth 3 (three) times daily as needed for pain.    Dispense:  10 tablet    Refill:  0    Order Specific Question:   Supervising Provider    Answer:   Lavera Guise [[id number]]  . HYDROcodone-acetaminophen (NORCO) 7.5-325 MG tablet    Sig: Take 1 tablet by mouth every 4 (four) hours as needed for moderate pain.    Dispense:  30 tablet    Refill:  0    Slight increase in dose    Order Specific Question:   Supervising Provider    Answer:   Lavera Guise [[id number]]  . dicyclomine (BENTYL) 10 MG capsule    Sig: Take 1 capsule (10 mg total) by mouth 4 (four) times daily -  before meals and at bedtime.    Dispense:  90 capsule    Refill:  2    Order Specific Question:   Supervising Provider    Answer:   Lavera Guise [[id number]]  . diphenoxylate-atropine (LOMOTIL) 2.5-0.025 MG tablet    Sig: Take 1 tablet by mouth 4 (four) times daily as needed for diarrhea or loose stools.    Dispense:  30 tablet    Refill:  1    Order Specific Question:   Supervising Provider    Answer:   Lavera Guise [[id number]]    Time spent: 25 Minutes

## 2018-05-04 LAB — CULTURE, URINE COMPREHENSIVE

## 2018-05-17 ENCOUNTER — Other Ambulatory Visit: Payer: Self-pay

## 2018-05-17 DIAGNOSIS — M792 Neuralgia and neuritis, unspecified: Secondary | ICD-10-CM

## 2018-05-17 MED ORDER — METOPROLOL SUCCINATE ER 50 MG PO TB24: 50.0000 mg | ORAL_TABLET | Freq: Every day | ORAL | 0 refills | Status: DC

## 2018-05-17 MED ORDER — GABAPENTIN 300 MG PO CAPS: ORAL_CAPSULE | ORAL | 0 refills | Status: DC

## 2018-05-17 NOTE — Telephone Encounter (Signed)
Pt called she went training for [place] she left her med as per heather send 3 days of metoprolol and gabapentin

## 2018-06-15 ENCOUNTER — Other Ambulatory Visit: Payer: Self-pay

## 2018-06-15 MED ORDER — METOPROLOL SUCCINATE ER 50 MG PO TB24: 50.0000 mg | ORAL_TABLET | Freq: Every day | ORAL | 1 refills | Status: DC

## 2018-06-23 ENCOUNTER — Encounter: Payer: Self-pay | Admitting: Nurse Practitioner

## 2018-06-24 ENCOUNTER — Other Ambulatory Visit: Payer: Self-pay

## 2018-06-24 DIAGNOSIS — M792 Neuralgia and neuritis, unspecified: Secondary | ICD-10-CM

## 2018-06-24 MED ORDER — GABAPENTIN 300 MG PO CAPS: ORAL_CAPSULE | ORAL | 1 refills | Status: DC

## 2018-07-01 ENCOUNTER — Encounter: Payer: Self-pay | Admitting: Nurse Practitioner

## 2018-07-08 ENCOUNTER — Other Ambulatory Visit: Payer: Self-pay

## 2018-07-08 MED ORDER — PANTOPRAZOLE SODIUM 40 MG PO TBEC: DELAYED_RELEASE_TABLET | ORAL | 3 refills | Status: DC

## 2018-07-11 ENCOUNTER — Encounter: Payer: Self-pay | Admitting: Nurse Practitioner

## 2018-07-11 ENCOUNTER — Ambulatory Visit (INDEPENDENT_AMBULATORY_CARE_PROVIDER_SITE_OTHER): Payer: BC Managed Care – PPO | Admitting: Nurse Practitioner

## 2018-07-11 VITALS — BP 110/80 | HR 68 | Resp 16 | Ht 64.0 in | Wt 201.0 lb

## 2018-07-11 DIAGNOSIS — Z1239 Encounter for other screening for malignant neoplasm of breast: Secondary | ICD-10-CM

## 2018-07-11 DIAGNOSIS — I10 Essential (primary) hypertension: Secondary | ICD-10-CM

## 2018-07-11 DIAGNOSIS — E1165 Type 2 diabetes mellitus with hyperglycemia: Secondary | ICD-10-CM | POA: Diagnosis not present

## 2018-07-11 DIAGNOSIS — E1142 Type 2 diabetes mellitus with diabetic polyneuropathy: Secondary | ICD-10-CM

## 2018-07-11 DIAGNOSIS — R300 Dysuria: Secondary | ICD-10-CM | POA: Diagnosis not present

## 2018-07-11 DIAGNOSIS — E559 Vitamin D deficiency, unspecified: Secondary | ICD-10-CM

## 2018-07-11 DIAGNOSIS — F411 Generalized anxiety disorder: Secondary | ICD-10-CM | POA: Diagnosis not present

## 2018-07-11 DIAGNOSIS — Z0001 Encounter for general adult medical examination with abnormal findings: Secondary | ICD-10-CM

## 2018-07-11 DIAGNOSIS — M792 Neuralgia and neuritis, unspecified: Secondary | ICD-10-CM

## 2018-07-11 LAB — POCT GLYCOSYLATED HEMOGLOBIN (HGB A1C): Hemoglobin A1C: 5.6 % (ref 4.0–5.6)

## 2018-07-11 MED ORDER — HYDROCODONE-ACETAMINOPHEN 7.5-325 MG PO TABS: 1.0000 | ORAL_TABLET | ORAL | 0 refills | Status: DC | PRN

## 2018-07-11 MED ORDER — ALPRAZOLAM 0.5 MG PO TABS: 0.5000 mg | ORAL_TABLET | Freq: Every evening | ORAL | 3 refills | Status: DC | PRN

## 2018-07-11 NOTE — Progress Notes (Signed)
[facility] [place], [place] [address]  Internal MEDICINE  Office Visit Note  Patient Name: Wendy Hubbard  [id number]  [id number]  Date of Service: 07/27/2018    Pt is here for routine health maintenance examination  Chief Complaint  Patient presents with  . Annual Exam  . Diabetes  . Hyperlipidemia  . Hypertension  . Quality Metric Gaps    mammogram     The patient is here for health maintenance exam. She is having increased anxiety and depression. Has had many family issues go on over past few days which are weighing on her mind. She is tearful. She is also concerned she may have a urinary tract infection. She has noted some frequency and urgency when urinating.  Her blood sugars and blood pressure continue to be well controlled.     Current Medication: Outpatient Encounter Medications as of 07/11/2018  Medication Sig Note  . ALPRAZolam (XANAX) 0.5 MG tablet Take 1 tablet (0.5 mg total) by mouth at bedtime as needed for anxiety.   . cetirizine (ZYRTEC) 10 MG tablet Take 10 mg by mouth daily.   Marland Kitchen dicyclomine (BENTYL) 10 MG capsule Take 1 capsule (10 mg total) by mouth 4 (four) times daily -  before meals and at bedtime.   . diphenoxylate-atropine (LOMOTIL) 2.5-0.025 MG tablet Take 1 tablet by mouth 4 (four) times daily as needed for diarrhea or loose stools.   Marland Kitchen escitalopram (LEXAPRO) 10 MG tablet Take 1 tablet (10 mg total) by mouth every evening.   . gabapentin (NEURONTIN) 300 MG capsule Take 1 capsule po BID. Then take 2 capsules po QHS. (four capsules daily).   Marland Kitchen HYDROcodone-acetaminophen (NORCO) 7.5-325 MG tablet Take 1 tablet by mouth every 4 (four) hours as needed for moderate pain.   . metoprolol succinate (TOPROL-XL) 50 MG 24 hr tablet Take 1 tablet (50 mg total) by mouth at bedtime. Take with or immediately following a meal.   . pantoprazole (PROTONIX) 40 MG tablet Take one tablet by mouth daily.   Marland Kitchen spironolactone (ALDACTONE) 50 MG tablet  Take 50 mg by mouth daily. 08/22/2014: .   Marland Kitchen tiZANidine (ZANAFLEX) 4 MG tablet TAKE 1 TABLET BY MOUTH EVERYDAY AT BEDTIME   . [DISCONTINUED] ALPRAZolam (XANAX) 0.25 MG tablet Take 1 tablet (0.25 mg total) by mouth at bedtime as needed for anxiety.   . [DISCONTINUED] butalbital-acetaminophen-caffeine (FIORICET, ESGIC) 50-325-40 MG tablet Take 1-2 tablets by mouth every 6 (six) hours as needed for headache.   . [DISCONTINUED] Dulaglutide (TRULICITY) 7.12 WP/8.0DX SOPN Inject into the skin.   . [DISCONTINUED] HYDROcodone-acetaminophen (NORCO) 7.5-325 MG tablet Take 1 tablet by mouth every 4 (four) hours as needed for moderate pain.   . [DISCONTINUED] levofloxacin (LEVAQUIN) 500 MG tablet Take 1 tablet (500 mg total) by mouth daily.   . [DISCONTINUED] methylPREDNISolone (MEDROL) 4 MG TBPK tablet Take by mouth as directed for 6 days   . [DISCONTINUED] montelukast (SINGULAIR) 10 MG tablet Take 1 tablet (10 mg total) by mouth at bedtime.   . [DISCONTINUED] phenazopyridine (PYRIDIUM) 200 MG tablet Take 1 tablet (200 mg total) by mouth 3 (three) times daily as needed for pain.   . [DISCONTINUED] sulfamethoxazole-trimethoprim (BACTRIM DS,SEPTRA DS) 800-160 MG tablet Take 1 tablet by mouth 2 (two) times daily.    No facility-administered encounter medications on file as of 07/11/2018.     Surgical History: Past Surgical History:  Procedure Laterality Date  . ABDOMINAL HYSTERECTOMY    . ANKLE ARTHROSCOPY Bilateral   .  APPENDECTOMY    . BLADDER SUSPENSION    . BREAST EXCISIONAL BIOPSY Left 1990   neg  . BREAST EXCISIONAL BIOPSY Right 1989   neg  . BREAST SURGERY    . CHOLECYSTECTOMY    . COLONOSCOPY N/A 11/04/2015   Procedure: COLONOSCOPY;  Surgeon: Manya Silvas, MD;  Location: [facility] ENDOSCOPY;  Service: Endoscopy;  Laterality: N/A;  . ESOPHAGOGASTRODUODENOSCOPY N/A 11/02/2015   Procedure: ESOPHAGOGASTRODUODENOSCOPY (EGD);  Surgeon: Manya Silvas, MD;  Location: [facility] ENDOSCOPY;  Service:  Endoscopy;  Laterality: N/A;  . KNEE ARTHROSCOPY Left   . REDUCTION MAMMAPLASTY Bilateral 1988  . TONSILLECTOMY      Medical History: Past Medical History:  Diagnosis Date  . COPD (chronic obstructive pulmonary disease) ([place])   . Diabetes mellitus without complication ([place])   . GERD (gastroesophageal reflux disease)   . Hyperlipidemia   . Hypertension   . Rapid heart rate   . Sleep apnea     Family History: Family History  Problem Relation Age of Onset  . Hypertension Other   . Bladder Cancer Mother   . Kidney cancer Neg Hx   . Prostate cancer Neg Hx   . Breast cancer Neg Hx       Review of Systems  Constitutional: Positive for fatigue. Negative for activity change, chills, diaphoresis and unexpected weight change.  HENT: Negative for ear pain, postnasal drip and sinus pressure.   Respiratory: Negative for cough (with blood ), shortness of breath and wheezing.   Cardiovascular: Negative for chest pain and palpitations.  Gastrointestinal: Positive for diarrhea. Negative for abdominal pain, constipation, nausea and vomiting.       Intestinal spasms  Endocrine: Negative for cold intolerance, heat intolerance, polydipsia and polyuria.  Genitourinary: Positive for dysuria, frequency, hematuria and urgency. Negative for flank pain.  Musculoskeletal: Negative for arthralgias, back pain, gait problem and neck pain.       Neuropathy in both feet .feels like burning sensation. Worse at night, especially when sheets or other material is touching her feet .  Skin: Negative for color change and rash.  Allergic/Immunologic: Negative for environmental allergies and food allergies.  Neurological: Positive for headaches. Negative for dizziness.  Hematological: Negative for adenopathy. Does not bruise/bleed easily.  Psychiatric/Behavioral: Negative for agitation, behavioral problems (depression) and hallucinations. The patient is nervous/anxious.     Today's Vitals   07/11/18 1444   BP: 110/80  Pulse: 68  Resp: 16  SpO2: 97%  Weight: 201 lb (91.2 kg)  Height: 5\' 4"  (1.626 m)   Body mass index is 34.5 kg/m.  Physical Exam Vitals signs and nursing note reviewed.  Constitutional:      General: She is not in acute distress.    Appearance: Normal appearance. She is well-developed. She is not diaphoretic.  HENT:     Head: Normocephalic and atraumatic.     Mouth/Throat:     Pharynx: No oropharyngeal exudate.  Eyes:     Conjunctiva/sclera: Conjunctivae normal.     Pupils: Pupils are equal, round, and reactive to light.  Neck:     Musculoskeletal: Normal range of motion and neck supple.     Thyroid: No thyromegaly.     Vascular: No carotid bruit or JVD.     Trachea: No tracheal deviation.  Cardiovascular:     Rate and Rhythm: Normal rate and regular rhythm.     Pulses: Normal pulses.          Posterior tibial pulses are 2+ on the right side  and 2+ on the left side.     Heart sounds: Normal heart sounds. No murmur. No friction rub. No gallop.   Pulmonary:     Effort: Pulmonary effort is normal. No respiratory distress.     Breath sounds: Normal breath sounds. No wheezing or rales.  Chest:     Chest wall: No tenderness.     Breasts:        Right: Normal. No swelling, bleeding, inverted nipple, mass, nipple discharge, skin change or tenderness.        Left: Normal. No swelling, bleeding, inverted nipple, mass, nipple discharge, skin change or tenderness.  Abdominal:     General: Bowel sounds are normal.     Palpations: Abdomen is soft.     Tenderness: There is no abdominal tenderness.  Musculoskeletal: Normal range of motion.     Right foot: Normal range of motion.     Left foot: Normal range of motion. No deformity.  Feet:     Right foot:     Protective Sensation: 10 sites tested. 5 sites sensed.     Skin integrity: Skin integrity normal.     Left foot:     Protective Sensation: 10 sites tested. 5 sites sensed.     Skin integrity: Skin integrity  normal.  Lymphadenopathy:     Cervical: No cervical adenopathy.  Skin:    General: Skin is warm and dry.  Neurological:     Mental Status: She is alert and oriented to person, place, and time.     Cranial Nerves: No cranial nerve deficit.  Psychiatric:        Behavior: Behavior normal.        Thought Content: Thought content normal.        Judgment: Judgment normal.   LABS: Recent Results (from the past 2160 hour(s))  POCT Urinalysis Dipstick     Status: Abnormal   Collection Time: 04/29/18  9:49 AM  Result Value Ref Range   Color, UA     Clarity, UA     Glucose, UA Negative Negative   Bilirubin, UA negative    Ketones, UA negative    Spec Grav, UA 1.010 1.010 - 1.025   Blood, UA moderate    pH, UA 5.0 5.0 - 8.0   Protein, UA Negative Negative   Urobilinogen, UA 0.2 0.2 or 1.0 E.U./dL   Nitrite, UA negative    Leukocytes, UA Large (3+) (A) Negative   Appearance     Odor    CULTURE, URINE COMPREHENSIVE     Status: None   Collection Time: 04/29/18  4:09 PM  Result Value Ref Range   Urine Culture, Comprehensive Final report    Organism ID, Bacteria Comment     Comment: Mixed urogenital flora 10,000-25,000 colony forming units per mL   UA/M w/rflx Culture, Routine     Status: Abnormal   Collection Time: 07/11/18  2:42 PM  Result Value Ref Range   Specific Gravity, UA 1.020 1.005 - 1.030   pH, UA 5.0 5.0 - 7.5   Color, UA Yellow Yellow   Appearance Ur Clear Clear   Leukocytes, UA 2+ (A) Negative   Protein, UA Negative Negative/Trace   Glucose, UA Negative Negative   Ketones, UA Negative Negative   RBC, UA Negative Negative   Bilirubin, UA Negative Negative   Urobilinogen, Ur 0.2 0.2 - 1.0 mg/dL   Nitrite, UA Negative Negative   Microscopic Examination See below:     Comment: Microscopic was indicated  and was performed.   Urinalysis Reflex Comment     Comment: This specimen has reflexed to a Urine Culture.  Microscopic Examination     Status: Abnormal    Collection Time: 07/11/18  2:42 PM  Result Value Ref Range   WBC, UA >30 (A) 0 - 5 /hpf   RBC, UA 0-2 0 - 2 /hpf   Epithelial Cells (non renal) 0-10 0 - 10 /hpf   Casts None seen None seen /lpf   Mucus, UA Present Not Estab.   Bacteria, UA None seen None seen/Few  Urine Culture, Reflex     Status: Abnormal   Collection Time: 07/11/18  2:42 PM  Result Value Ref Range   Urine Culture, Routine Final report (A)    Organism ID, Bacteria Klebsiella pneumoniae (A)     Comment: 25,000-50,000 colony forming units per mL Cefazolin <=4 ug/mL Cefazolin with an MIC <=16 predicts susceptibility to the oral agents cefaclor, cefdinir, cefpodoxime, cefprozil, cefuroxime, cephalexin, and loracarbef when used for therapy of uncomplicated urinary tract infections due to E. coli, Klebsiella pneumoniae, and Proteus mirabilis.    Antimicrobial Susceptibility Comment     Comment:       ** S = Susceptible; I = Intermediate; R = Resistant **                    P = Positive; N = Negative             MICS are expressed in micrograms per mL    Antibiotic                 RSLT#1    RSLT#2    RSLT#3    RSLT#4 Amoxicillin/Clavulanic Acid    S Ampicillin                     R Cefepime                       S Ceftriaxone                    S Cefuroxime                     S Ciprofloxacin                  S Ertapenem                      S Gentamicin                     S Imipenem                       S Levofloxacin                   S Meropenem                      S Nitrofurantoin                 I Piperacillin/Tazobactam        S Tetracycline                   S Tobramycin                     S Trimethoprim/Sulfa  S   POCT HgB A1C     Status: None   Collection Time: 07/11/18  3:09 PM  Result Value Ref Range   Hemoglobin A1C 5.6 4.0 - 5.6 %   HbA1c POC (<> result, manual entry)     HbA1c, POC (prediabetic range)     HbA1c, POC (controlled diabetic range)      Assessment/Plan: 1.  Encounter for general adult medical examination with abnormal findings Annual wellness visit today - CBC with Differential/Platelet - Comprehensive metabolic panel - T4, free - TSH  2. Type 2 diabetes mellitus with peripheral neuropathy (HCC) HgbA1c 5.6 today. Continue to manage with diet and increased exercise. Gabapentin as precribed  3. Essential hypertension Stable. Continue bp medication as prescribed  - T4, free - TSH - Lipid panel  4. Generalized anxiety disorder May take alprazolam 0.5mg  at bedtime as needed for anxiety which makes it difficult to sleep. - ALPRAZolam (XANAX) 0.5 MG tablet; Take 1 tablet (0.5 mg total) by mouth at bedtime as needed for anxiety.  Dispense: 30 tablet; Refill: 3  5. Neuralgia and neuritis, unspecified May take hydrocodone/APPA 7.5/325mg  tablets at bedtim only as needed for severe neuropathic pain in both feet. New prescription provided today.  - HYDROcodone-acetaminophen (NORCO) 7.5-325 MG tablet; Take 1 tablet by mouth every 4 (four) hours as needed for moderate pain.  Dispense: 30 tablet; Refill: 0  6. Vitamin D deficiency - Vitamin D 1,25 dihydroxy  8. Screening for breast cancer - MM 3D SCREEN BREAST BILATERAL; Future  9. Dysuria - UA/M w/rflx Culture, Routine  General Counseling: Wendy Hubbard verbalizes understanding of the findings of todays visit and agrees with plan of treatment. I have discussed any further diagnostic evaluation that may be needed or ordered today. We also reviewed her medications today. she has been encouraged to call the office with any questions or concerns that should arise related to todays visit.    Counseling:  Diabetes Counseling:  1. Addition of ACE inh/ ARB'S for nephroprotection. Microalbumin is updated  2. Diabetic foot care, prevention of complications. Podiatry consult 3. Exercise and lose weight.  4. Diabetic eye examination, Diabetic eye exam is updated  5. Monitor blood sugar closlely. nutrition  counseling.  6. Sign and symptoms of hypoglycemia including shaking sweating,confusion and headaches.  This patient was seen by Leretha Pol FNP Collaboration with Dr Lavera Guise as a part of collaborative care agreement  Orders Placed This Encounter  Procedures  . Microscopic Examination  . Urine Culture, Reflex  . MM 3D SCREEN BREAST BILATERAL  . UA/M w/rflx Culture, Routine  . CBC with Differential/Platelet  . Comprehensive metabolic panel  . T4, free  . TSH  . Lipid panel  . Vitamin D 1,25 dihydroxy  . POCT HgB A1C    Meds ordered this encounter  Medications  . ALPRAZolam (XANAX) 0.5 MG tablet    Sig: Take 1 tablet (0.5 mg total) by mouth at bedtime as needed for anxiety.    Dispense:  30 tablet    Refill:  3    Order Specific Question:   Supervising Provider    Answer:   Lavera Guise [[id number]]  . HYDROcodone-acetaminophen (NORCO) 7.5-325 MG tablet    Sig: Take 1 tablet by mouth every 4 (four) hours as needed for moderate pain.    Dispense:  30 tablet    Refill:  0    Slight increase in dose    Order Specific Question:   Supervising Provider    Answer:  Lavera Guise [[id number]]    Time spent: [place], MD  Internal Medicine

## 2018-07-14 ENCOUNTER — Telehealth: Payer: Self-pay | Admitting: Nurse Practitioner

## 2018-07-14 ENCOUNTER — Emergency Department
Admission: EM | Admit: 2018-07-14 | Discharge: 2018-07-14 | Disposition: A | Discharge status: Home / Self Care | Payer: BLUE CROSS/BLUE SHIELD | Attending: Emergency Medicine | Admitting: Emergency Medicine

## 2018-07-14 ENCOUNTER — Other Ambulatory Visit: Payer: Self-pay

## 2018-07-14 ENCOUNTER — Emergency Department: Payer: BLUE CROSS/BLUE SHIELD

## 2018-07-14 ENCOUNTER — Encounter: Payer: Self-pay | Admitting: Emergency Medicine

## 2018-07-14 DIAGNOSIS — I10 Essential (primary) hypertension: Secondary | ICD-10-CM | POA: Insufficient documentation

## 2018-07-14 DIAGNOSIS — E1140 Type 2 diabetes mellitus with diabetic neuropathy, unspecified: Secondary | ICD-10-CM | POA: Insufficient documentation

## 2018-07-14 DIAGNOSIS — G44319 Acute post-traumatic headache, not intractable: Secondary | ICD-10-CM

## 2018-07-14 DIAGNOSIS — S161XXA Strain of muscle, fascia and tendon at neck level, initial encounter: Secondary | ICD-10-CM

## 2018-07-14 DIAGNOSIS — S299XXA Unspecified injury of thorax, initial encounter: Secondary | ICD-10-CM | POA: Diagnosis not present

## 2018-07-14 DIAGNOSIS — J449 Chronic obstructive pulmonary disease, unspecified: Secondary | ICD-10-CM | POA: Diagnosis not present

## 2018-07-14 DIAGNOSIS — M542 Cervicalgia: Secondary | ICD-10-CM | POA: Diagnosis not present

## 2018-07-14 DIAGNOSIS — S46912A Strain of unspecified muscle, fascia and tendon at shoulder and upper arm level, left arm, initial encounter: Secondary | ICD-10-CM

## 2018-07-14 DIAGNOSIS — S4992XA Unspecified injury of left shoulder and upper arm, initial encounter: Secondary | ICD-10-CM | POA: Diagnosis not present

## 2018-07-14 DIAGNOSIS — Y9389 Activity, other specified: Secondary | ICD-10-CM | POA: Diagnosis not present

## 2018-07-14 DIAGNOSIS — Y92410 Unspecified street and highway as the place of occurrence of the external cause: Secondary | ICD-10-CM | POA: Insufficient documentation

## 2018-07-14 DIAGNOSIS — Y999 Unspecified external cause status: Secondary | ICD-10-CM | POA: Diagnosis not present

## 2018-07-14 DIAGNOSIS — R0789 Other chest pain: Secondary | ICD-10-CM | POA: Insufficient documentation

## 2018-07-14 DIAGNOSIS — Z87891 Personal history of nicotine dependence: Secondary | ICD-10-CM | POA: Insufficient documentation

## 2018-07-14 DIAGNOSIS — S3992XA Unspecified injury of lower back, initial encounter: Secondary | ICD-10-CM | POA: Diagnosis not present

## 2018-07-14 DIAGNOSIS — M25512 Pain in left shoulder: Secondary | ICD-10-CM | POA: Diagnosis not present

## 2018-07-14 DIAGNOSIS — S199XXA Unspecified injury of neck, initial encounter: Secondary | ICD-10-CM | POA: Diagnosis not present

## 2018-07-14 DIAGNOSIS — S0990XA Unspecified injury of head, initial encounter: Secondary | ICD-10-CM | POA: Diagnosis not present

## 2018-07-14 DIAGNOSIS — M545 Low back pain, unspecified: Secondary | ICD-10-CM

## 2018-07-14 DIAGNOSIS — M549 Dorsalgia, unspecified: Secondary | ICD-10-CM | POA: Diagnosis not present

## 2018-07-14 DIAGNOSIS — R51 Headache: Secondary | ICD-10-CM | POA: Diagnosis not present
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
## 2018-07-14 NOTE — Discharge Instructions (Addendum)
Follow-up with your primary care provider if any continued problems.  Use ice or moist heat to your muscles as needed for discomfort.  Continue taking your regular medication.  You may also take your hydrocodone every 6-8 hours as needed for pain.  The Zanaflex can be taken 3 times a day if needed for muscle spasms.

## 2018-07-15 ENCOUNTER — Other Ambulatory Visit: Payer: Self-pay

## 2018-07-15 DIAGNOSIS — J301 Allergic rhinitis due to pollen: Secondary | ICD-10-CM

## 2018-07-15 LAB — MICROSCOPIC EXAMINATION
Bacteria, UA: NONE SEEN
Casts: NONE SEEN /lpf
WBC, UA: >30 /hpf — AB (ref 0–5)

## 2018-07-15 LAB — UA/M W/RFLX CULTURE, ROUTINE
Bilirubin, UA: NEGATIVE
Glucose, UA: NEGATIVE
Ketones, UA: NEGATIVE
Nitrite, UA: NEGATIVE
Protein, UA: NEGATIVE
RBC, UA: NEGATIVE
Specific Gravity, UA: 1.02 (ref 1.005–1.030)
Urobilinogen, Ur: 0.2 mg/dL (ref 0.2–1.0)
pH, UA: 5 (ref 5.0–7.5)

## 2018-07-15 LAB — URINE CULTURE, REFLEX

## 2018-07-15 MED ORDER — MONTELUKAST SODIUM 10 MG PO TABS: 10.0000 mg | ORAL_TABLET | Freq: Every day | ORAL | 3 refills | Status: DC

## 2018-07-20 ENCOUNTER — Ambulatory Visit
Admission: RE | Admit: 2018-07-20 | Discharge: 2018-07-20 | Disposition: A | Discharge status: Home / Self Care | Payer: BLUE CROSS/BLUE SHIELD | Source: Ambulatory Visit | Attending: Nurse Practitioner | Admitting: Nurse Practitioner

## 2018-07-20 ENCOUNTER — Other Ambulatory Visit: Payer: Self-pay

## 2018-07-20 DIAGNOSIS — Z1239 Encounter for other screening for malignant neoplasm of breast: Secondary | ICD-10-CM

## 2018-07-20 DIAGNOSIS — R51 Headache: Secondary | ICD-10-CM | POA: Diagnosis not present

## 2018-07-20 DIAGNOSIS — Z1231 Encounter for screening mammogram for malignant neoplasm of breast: Secondary | ICD-10-CM | POA: Diagnosis not present

## 2018-07-20 DIAGNOSIS — M9902 Segmental and somatic dysfunction of thoracic region: Secondary | ICD-10-CM | POA: Diagnosis not present

## 2018-07-20 DIAGNOSIS — M9901 Segmental and somatic dysfunction of cervical region: Secondary | ICD-10-CM | POA: Diagnosis not present

## 2018-07-20 DIAGNOSIS — M5414 Radiculopathy, thoracic region: Secondary | ICD-10-CM | POA: Diagnosis not present

## 2018-07-22 ENCOUNTER — Telehealth: Payer: Self-pay

## 2018-07-22 DIAGNOSIS — M9901 Segmental and somatic dysfunction of cervical region: Secondary | ICD-10-CM | POA: Diagnosis not present

## 2018-07-22 DIAGNOSIS — R51 Headache: Secondary | ICD-10-CM | POA: Diagnosis not present

## 2018-07-22 DIAGNOSIS — M5414 Radiculopathy, thoracic region: Secondary | ICD-10-CM | POA: Diagnosis not present

## 2018-07-22 DIAGNOSIS — M9902 Segmental and somatic dysfunction of thoracic region: Secondary | ICD-10-CM | POA: Diagnosis not present

## 2018-07-25 DIAGNOSIS — M9901 Segmental and somatic dysfunction of cervical region: Secondary | ICD-10-CM | POA: Diagnosis not present

## 2018-07-25 DIAGNOSIS — M9902 Segmental and somatic dysfunction of thoracic region: Secondary | ICD-10-CM | POA: Diagnosis not present

## 2018-07-25 DIAGNOSIS — M5414 Radiculopathy, thoracic region: Secondary | ICD-10-CM | POA: Diagnosis not present

## 2018-07-25 DIAGNOSIS — R51 Headache: Secondary | ICD-10-CM | POA: Diagnosis not present
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
## 2018-07-26 ENCOUNTER — Telehealth: Payer: Self-pay

## 2018-07-27 ENCOUNTER — Other Ambulatory Visit: Payer: Self-pay | Admitting: Nurse Practitioner

## 2018-07-27 DIAGNOSIS — H1033 Unspecified acute conjunctivitis, bilateral: Secondary | ICD-10-CM | POA: Diagnosis not present

## 2018-07-27 DIAGNOSIS — N390 Urinary tract infection, site not specified: Secondary | ICD-10-CM

## 2018-07-27 DIAGNOSIS — F411 Generalized anxiety disorder: Secondary | ICD-10-CM | POA: Insufficient documentation

## 2018-07-27 DIAGNOSIS — E559 Vitamin D deficiency, unspecified: Secondary | ICD-10-CM | POA: Insufficient documentation

## 2018-07-27 DIAGNOSIS — G43009 Migraine without aura, not intractable, without status migrainosus: Secondary | ICD-10-CM

## 2018-07-27 DIAGNOSIS — E1165 Type 2 diabetes mellitus with hyperglycemia: Secondary | ICD-10-CM | POA: Insufficient documentation

## 2018-07-27 MED ORDER — BUTALBITAL-APAP-CAFFEINE 50-325-40 MG PO TABS: 1.0000 | ORAL_TABLET | Freq: Four times a day (QID) | ORAL | 2 refills | Status: DC | PRN

## 2018-07-27 MED ORDER — CIPROFLOXACIN HCL 500 MG PO TABS: 500.0000 mg | ORAL_TABLET | Freq: Two times a day (BID) | ORAL | 0 refills | Status: DC

## 2018-07-27 NOTE — Telephone Encounter (Signed)
Renewed fioricet for migraines to take as needed and as prescribed. I saw her urine sample/culture - did show mild infection. I had been waiting for other lab results which are not available. I added cipro 500mg  twice daily for 7 days.

## 2018-07-27 NOTE — Progress Notes (Signed)
Renewed fioricet for migraines to take as needed and as prescribed. I saw her urine sample/culture - did show mild infection. I had been waiting for other lab results which are not available. I added cipro 500mg  twice daily for 7 days.

## 2018-07-27 NOTE — Telephone Encounter (Signed)
Pt advised urine did showed mild infection we send cipro for 7 days

## 2018-07-27 NOTE — Telephone Encounter (Signed)
Pt advised we send fioricet  And also cipro for mild urine

## 2018-08-05 ENCOUNTER — Other Ambulatory Visit: Payer: Self-pay | Admitting: Nurse Practitioner

## 2018-08-05 MED ORDER — TIZANIDINE HCL 4 MG PO TABS: ORAL_TABLET | ORAL | 2 refills | Status: DC

## 2018-09-21 NOTE — Telephone Encounter (Signed)
This reminder was taken care of.

## 2018-09-27 ENCOUNTER — Other Ambulatory Visit: Payer: Self-pay

## 2018-09-27 MED ORDER — ESCITALOPRAM OXALATE 10 MG PO TABS: 10.0000 mg | ORAL_TABLET | Freq: Every evening | ORAL | 1 refills | Status: DC

## 2018-10-04 ENCOUNTER — Other Ambulatory Visit: Payer: Self-pay

## 2018-10-04 MED ORDER — PANTOPRAZOLE SODIUM 40 MG PO TBEC: DELAYED_RELEASE_TABLET | ORAL | 3 refills | Status: DC

## 2018-10-18 ENCOUNTER — Encounter: Payer: Self-pay | Admitting: Internal Medicine

## 2018-10-18 ENCOUNTER — Other Ambulatory Visit: Payer: Self-pay

## 2018-10-18 ENCOUNTER — Ambulatory Visit: Payer: BC Managed Care – PPO | Admitting: Internal Medicine

## 2018-10-18 VITALS — Temp 98.6°F | Resp 16 | Ht 64.0 in | Wt 201.0 lb

## 2018-10-18 DIAGNOSIS — R042 Hemoptysis: Secondary | ICD-10-CM | POA: Diagnosis not present

## 2018-10-18 DIAGNOSIS — I10 Essential (primary) hypertension: Secondary | ICD-10-CM | POA: Diagnosis not present

## 2018-10-18 NOTE — Progress Notes (Signed)
[place],  [id number]  Internal MEDICINE  Telephone Visit  Patient Name: Wendy Hubbard  [id number]  [id number]  Date of Service: 11/11/2018  I connected with the patient at 402 by telephone and verified the patients identity using two identifiers.   I discussed the limitations, risks, security and privacy concerns of performing an evaluation and management service by telephone and the availability of in person appointments. I also discussed with the patient that there may be a patient responsible charge related to the service.  The patient expressed understanding and agrees to proceed.    Chief Complaint  Patient presents with  . Telephone Assessment  . Telephone Screen  . Medical Management of Chronic Issues    coughing up blood in the morning  going for week and burgandy color   . Back Pain    right side of upper back going on few days     HPI  PT is seen via video back pain under right scapula.  She is also noticing some hemopysis again.  She had this intermittnelty last year, and was unable to obtain a CT at that time.     Current Medication: Outpatient Encounter Medications as of 10/18/2018  Medication Sig Note  . ALPRAZolam (XANAX) 0.5 MG tablet Take 1 tablet (0.5 mg total) by mouth at bedtime as needed for anxiety.   . butalbital-acetaminophen-caffeine (FIORICET, ESGIC) 50-325-40 MG tablet Take 1-2 tablets by mouth every 6 (six) hours as needed for headache.   . cetirizine (ZYRTEC) 10 MG tablet Take 10 mg by mouth daily.   Marland Kitchen dicyclomine (BENTYL) 10 MG capsule Take 1 capsule (10 mg total) by mouth 4 (four) times daily -  before meals and at bedtime.   . diphenoxylate-atropine (LOMOTIL) 2.5-0.025 MG tablet Take 1 tablet by mouth 4 (four) times daily as needed for diarrhea or loose stools.   Marland Kitchen escitalopram (LEXAPRO) 10 MG tablet Take 1 tablet (10 mg total) by mouth every evening.   . metoprolol succinate (TOPROL-XL) 50 MG 24 hr tablet Take 1 tablet (50 mg total)  by mouth at bedtime. Take with or immediately following a meal.   . montelukast (SINGULAIR) 10 MG tablet Take 1 tablet (10 mg total) by mouth at bedtime.   . pantoprazole (PROTONIX) 40 MG tablet Take one tablet by mouth daily.   Marland Kitchen spironolactone (ALDACTONE) 50 MG tablet Take 50 mg by mouth daily. 08/22/2014: .   Marland Kitchen [DISCONTINUED] ciprofloxacin (CIPRO) 500 MG tablet Take 1 tablet (500 mg total) by mouth 2 (two) times daily. (Patient not taking: Reported on 11/07/2018)   . [DISCONTINUED] gabapentin (NEURONTIN) 300 MG capsule Take 1 capsule po BID. Then take 2 capsules po QHS. (four capsules daily).   . [DISCONTINUED] HYDROcodone-acetaminophen (NORCO) 7.5-325 MG tablet Take 1 tablet by mouth every 4 (four) hours as needed for moderate pain.   . [DISCONTINUED] tiZANidine (ZANAFLEX) 4 MG tablet TAKE 1 TABLET BY MOUTH EVERYDAY AT BEDTIME    No facility-administered encounter medications on file as of 10/18/2018.     Surgical History: Past Surgical History:  Procedure Laterality Date  . ABDOMINAL HYSTERECTOMY    . ANKLE ARTHROSCOPY Bilateral   . APPENDECTOMY    . BLADDER SUSPENSION    . BREAST EXCISIONAL BIOPSY Left 1990   neg  . BREAST EXCISIONAL BIOPSY Right 1989   neg  . BREAST SURGERY    . CHOLECYSTECTOMY    . COLONOSCOPY N/A 11/04/2015   Procedure: COLONOSCOPY;  Surgeon: Manya Silvas,  MD;  Location: [facility] ENDOSCOPY;  Service: Endoscopy;  Laterality: N/A;  . ESOPHAGOGASTRODUODENOSCOPY N/A 11/02/2015   Procedure: ESOPHAGOGASTRODUODENOSCOPY (EGD);  Surgeon: Manya Silvas, MD;  Location: [facility] ENDOSCOPY;  Service: Endoscopy;  Laterality: N/A;  . KNEE ARTHROSCOPY Left   . REDUCTION MAMMAPLASTY Bilateral 1988  . TONSILLECTOMY      Medical History: Past Medical History:  Diagnosis Date  . COPD (chronic obstructive pulmonary disease) ([place])   . Diabetes mellitus without complication ([place])   . GERD (gastroesophageal reflux disease)   . Hyperlipidemia   . Hypertension   . Rapid heart rate    . Sleep apnea     Family History: Family History  Problem Relation Age of Onset  . Hypertension Other   . Bladder Cancer Mother   . Kidney cancer Neg Hx   . Prostate cancer Neg Hx   . Breast cancer Neg Hx     Social History   Socioeconomic History  . Marital status: Married    Spouse name: Not on file  . Number of children: Not on file  . Years of education: Not on file  . Highest education level: Not on file  Occupational History  . Not on file  Social Needs  . Financial resource strain: Not on file  . Food insecurity    Worry: Not on file    Inability: Not on file  . Transportation needs    Medical: Not on file    Non-medical: Not on file  Tobacco Use  . Smoking status: Former Research scientist (life sciences)  . Smokeless tobacco: Never Used  Substance and Sexual Activity  . Alcohol use: Yes    Alcohol/week: 0.0 standard drinks    Comment: occasional  . Drug use: No  . Sexual activity: Not on file  Lifestyle  . Physical activity    Days per week: Not on file    Minutes per session: Not on file  . Stress: Not on file  Relationships  . Social Herbalist on phone: Not on file    Gets together: Not on file    Attends religious service: Not on file    Active member of club or organization: Not on file    Attends meetings of clubs or organizations: Not on file    Relationship status: Not on file  . Intimate partner violence    Fear of current or ex partner: Not on file    Emotionally abused: Not on file    Physically abused: Not on file    Forced sexual activity: Not on file  Other Topics Concern  . Not on file  Social History Narrative  . Not on file      Review of Systems  Constitutional: Negative for chills, fatigue and unexpected weight change.  HENT: Negative for congestion, rhinorrhea, sneezing and sore throat.   Eyes: Negative for photophobia, pain and redness.  Respiratory: Negative for cough, chest tightness and shortness of breath.        Hemoptysis,  back pain under right scapula.  Cardiovascular: Negative for chest pain and palpitations.  Gastrointestinal: Negative for abdominal pain, constipation, diarrhea, nausea and vomiting.  Endocrine: Negative.   Genitourinary: Negative for dysuria and frequency.  Musculoskeletal: Negative for arthralgias, back pain, joint swelling and neck pain.  Skin: Negative for rash.  Allergic/Immunologic: Negative.   Neurological: Negative for tremors and numbness.  Hematological: Negative for adenopathy. Does not bruise/bleed easily.  Psychiatric/Behavioral: Negative for behavioral problems and sleep disturbance. The patient is not  nervous/anxious.     Vital Signs: Temp 98.6 F (37 C)   Resp 16   Ht 5\' 4"  (1.626 m)   Wt 201 lb (91.2 kg)   BMI 34.50 kg/m    Observation/Objective: Well appearing, NAD noted.  Speaking in full sentences.   Assessment/Plan: 1. Hemoptysis Will get CT with Contrast for this ongoing hemoptysis and follow up when results are available.  - CT Chest W Contrast; Future  2. Essential hypertension Generally under control.  No issues currently.  Continue current regimen.   General Counseling: Wendy Hubbard verbalizes understanding of the findings of today's phone visit and agrees with plan of treatment. I have discussed any further diagnostic evaluation that may be needed or ordered today. We also reviewed her medications today. she has been encouraged to call the office with any questions or concerns that should arise related to todays visit.    Orders Placed This Encounter  Procedures  . CT Chest W Contrast    No orders of the defined types were placed in this encounter.   Time spent: [place] [facility] Internal medicine

## 2018-10-25 ENCOUNTER — Ambulatory Visit: Payer: BLUE CROSS/BLUE SHIELD

## 2018-10-31 ENCOUNTER — Telehealth: Payer: Self-pay

## 2018-10-31 ENCOUNTER — Other Ambulatory Visit: Payer: Self-pay

## 2018-10-31 ENCOUNTER — Other Ambulatory Visit: Payer: Self-pay | Admitting: Adult Health

## 2018-10-31 DIAGNOSIS — Z298 Encounter for other specified prophylactic measures: Secondary | ICD-10-CM

## 2018-10-31 MED ORDER — TIZANIDINE HCL 4 MG PO TABS: ORAL_TABLET | ORAL | 3 refills | Status: DC

## 2018-10-31 NOTE — Progress Notes (Signed)
Stat Creatine ordered for patient to have CT scan.

## 2018-11-01 NOTE — Telephone Encounter (Signed)
Send lab

## 2018-11-03 ENCOUNTER — Other Ambulatory Visit: Payer: Self-pay

## 2018-11-03 ENCOUNTER — Ambulatory Visit
Admission: RE | Admit: 2018-11-03 | Discharge: 2018-11-03 | Disposition: A | Discharge status: Home / Self Care | Payer: BC Managed Care – PPO | Source: Ambulatory Visit | Attending: Internal Medicine | Admitting: Internal Medicine

## 2018-11-03 ENCOUNTER — Other Ambulatory Visit
Admission: RE | Admit: 2018-11-03 | Discharge: 2018-11-03 | Disposition: A | Discharge status: Home / Self Care | Payer: BC Managed Care – PPO | Source: Home / Self Care | Attending: Internal Medicine | Admitting: Internal Medicine

## 2018-11-03 DIAGNOSIS — R042 Hemoptysis: Secondary | ICD-10-CM

## 2018-11-03 DIAGNOSIS — J8410 Pulmonary fibrosis, unspecified: Secondary | ICD-10-CM | POA: Diagnosis not present

## 2018-11-03 LAB — CREATININE, SERUM
Creatinine, Ser: 0.87 mg/dL (ref 0.44–1.00)
GFR calc Af Amer: >60 mL/min (ref >60)
GFR calc non Af Amer: >60 mL/min (ref >60)

## 2018-11-03 LAB — BUN: BUN: 15 mg/dL (ref 8–23)

## 2018-11-03 MED ORDER — IOHEXOL 300 MG/ML  SOLN
75.0000 mL | Freq: Once | INTRAMUSCULAR | Status: AC | PRN
  Administered 2018-11-03: 75 mL via INTRAVENOUS

## 2018-11-07 ENCOUNTER — Other Ambulatory Visit: Payer: Self-pay | Admitting: Nurse Practitioner

## 2018-11-07 ENCOUNTER — Ambulatory Visit (INDEPENDENT_AMBULATORY_CARE_PROVIDER_SITE_OTHER): Payer: BC Managed Care – PPO | Admitting: Internal Medicine

## 2018-11-07 ENCOUNTER — Encounter: Payer: Self-pay | Admitting: Internal Medicine

## 2018-11-07 ENCOUNTER — Other Ambulatory Visit: Payer: Self-pay

## 2018-11-07 VITALS — BP 136/58 | HR 68 | Resp 16 | Ht 64.0 in | Wt 206.0 lb

## 2018-11-07 DIAGNOSIS — R0602 Shortness of breath: Secondary | ICD-10-CM | POA: Diagnosis not present

## 2018-11-07 DIAGNOSIS — R042 Hemoptysis: Secondary | ICD-10-CM | POA: Diagnosis not present

## 2018-11-07 DIAGNOSIS — R079 Chest pain, unspecified: Secondary | ICD-10-CM | POA: Diagnosis not present

## 2018-11-07 DIAGNOSIS — I10 Essential (primary) hypertension: Secondary | ICD-10-CM | POA: Diagnosis not present

## 2018-11-07 DIAGNOSIS — Z0001 Encounter for general adult medical examination with abnormal findings: Secondary | ICD-10-CM | POA: Diagnosis not present

## 2018-11-07 DIAGNOSIS — E65 Localized adiposity: Secondary | ICD-10-CM | POA: Diagnosis not present

## 2018-11-07 DIAGNOSIS — E559 Vitamin D deficiency, unspecified: Secondary | ICD-10-CM | POA: Diagnosis not present

## 2018-11-07 NOTE — Patient Instructions (Signed)
Hemoptysis  Hemoptysis is when you cough up blood. It can be mild or serious. If it is mild, you may cough up bloody spit and mucus (sputum). If you cough up 1-2 cups (240-480 mL) of blood within 24 hours (massive hemoptysis), it is an emergency. If you cough up blood, it is important to go and see your doctor. Follow these instructions at home:  Watch your condition for any changes.  Take over-the-counter and prescription medicines only as told by your doctor.  If you were prescribed an antibiotic medicine, take it as told by your doctor. Do not stop taking the antibiotic even if you start to feel better.  Go back to your normal activities as told by your doctor. Ask your doctor what activities are safe for you to do.  Do not use any products that contain nicotine or tobacco. These include cigarettes and e-cigarettes. If you need help quitting, ask your doctor.  Keep all follow-up visits as told by your doctor. This is important. Contact a doctor if:  You have a fever.  You cough up bloody spit and mucus. Get help right away if:  You cough up fresh blood or blood clots.  You have trouble breathing.  You have chest pain. This information is not intended to replace advice given to you by your health care provider. Make sure you discuss any questions you have with your health care provider. Document Released: 04/13/2012 Document Revised: 04/09/2017 Document Reviewed: 01/24/2016 Elsevier Patient Education  2020 Elsevier Inc.  

## 2018-11-07 NOTE — Progress Notes (Signed)
[facility] [place], [place] [address]  Pulmonary Sleep Medicine   Office Visit Note  Patient Name: Wendy Hubbard DOB: January 31, 1956 MRN [id number]  Date of Service: 11/07/2018  Complaints/HPI: She had a CT scan done of the chest which was negative for PE and also for any mass etc. She had some atheroscelerosis changes of the aorta without aneurysm. She has no major hemoptysis noted now. She does have a history of arthritis and has not had any intervention. Pain is controlled  ROS  General: (-) fever, (-) chills, (-) night sweats, (-) weakness Skin: (-) rashes, (-) itching,. Eyes: (-) visual changes, (-) redness, (-) itching. Nose and Sinuses: (-) nasal stuffiness or itchiness, (-) postnasal drip, (-) nosebleeds, (-) sinus trouble. Mouth and Throat: (-) sore throat, (-) hoarseness. Neck: (-) swollen glands, (-) enlarged thyroid, (-) neck pain. Respiratory: - cough, (-) bloody sputum, - shortness of breath, - wheezing. Cardiovascular: - ankle swelling, (-) chest pain. Lymphatic: (-) lymph node enlargement. Neurologic: (-) numbness, (-) tingling. Psychiatric: (-) anxiety, (-) depression   Current Medication: Outpatient Encounter Medications as of 11/07/2018  Medication Sig Note  . ALPRAZolam (XANAX) 0.5 MG tablet Take 1 tablet (0.5 mg total) by mouth at bedtime as needed for anxiety.   . butalbital-acetaminophen-caffeine (FIORICET, ESGIC) 50-325-40 MG tablet Take 1-2 tablets by mouth every 6 (six) hours as needed for headache.   . cetirizine (ZYRTEC) 10 MG tablet Take 10 mg by mouth daily.   Marland Kitchen dicyclomine (BENTYL) 10 MG capsule Take 1 capsule (10 mg total) by mouth 4 (four) times daily -  before meals and at bedtime.   . diphenoxylate-atropine (LOMOTIL) 2.5-0.025 MG tablet Take 1 tablet by mouth 4 (four) times daily as needed for diarrhea or loose stools.   Marland Kitchen escitalopram (LEXAPRO) 10 MG tablet Take 1 tablet (10 mg total) by mouth every evening.   .  gabapentin (NEURONTIN) 300 MG capsule Take 1 capsule po BID. Then take 2 capsules po QHS. (four capsules daily).   Marland Kitchen HYDROcodone-acetaminophen (NORCO) 7.5-325 MG tablet Take 1 tablet by mouth every 4 (four) hours as needed for moderate pain.   . metoprolol succinate (TOPROL-XL) 50 MG 24 hr tablet Take 1 tablet (50 mg total) by mouth at bedtime. Take with or immediately following a meal.   . montelukast (SINGULAIR) 10 MG tablet Take 1 tablet (10 mg total) by mouth at bedtime.   . pantoprazole (PROTONIX) 40 MG tablet Take one tablet by mouth daily.   Marland Kitchen spironolactone (ALDACTONE) 50 MG tablet Take 50 mg by mouth daily. 08/22/2014: .   Marland Kitchen tiZANidine (ZANAFLEX) 4 MG tablet TAKE 1 TABLET BY MOUTH EVERYDAY AT BEDTIME   . [DISCONTINUED] ciprofloxacin (CIPRO) 500 MG tablet Take 1 tablet (500 mg total) by mouth 2 (two) times daily. (Patient not taking: Reported on 11/07/2018)    No facility-administered encounter medications on file as of 11/07/2018.     Surgical History: Past Surgical History:  Procedure Laterality Date  . ABDOMINAL HYSTERECTOMY    . ANKLE ARTHROSCOPY Bilateral   . APPENDECTOMY    . BLADDER SUSPENSION    . BREAST EXCISIONAL BIOPSY Left 1990   neg  . BREAST EXCISIONAL BIOPSY Right 1989   neg  . BREAST SURGERY    . CHOLECYSTECTOMY    . COLONOSCOPY N/A 11/04/2015   Procedure: COLONOSCOPY;  Surgeon: Manya Silvas, MD;  Location: [facility] ENDOSCOPY;  Service: Endoscopy;  Laterality: N/A;  . ESOPHAGOGASTRODUODENOSCOPY N/A 11/02/2015   Procedure: ESOPHAGOGASTRODUODENOSCOPY (  EGD);  Surgeon: Manya Silvas, MD;  Location: [facility] ENDOSCOPY;  Service: Endoscopy;  Laterality: N/A;  . KNEE ARTHROSCOPY Left   . REDUCTION MAMMAPLASTY Bilateral 1988  . TONSILLECTOMY      Medical History: Past Medical History:  Diagnosis Date  . COPD (chronic obstructive pulmonary disease) ([place])   . Diabetes mellitus without complication ([place])   . GERD (gastroesophageal reflux disease)   . Hyperlipidemia   .  Hypertension   . Rapid heart rate   . Sleep apnea     Family History: Family History  Problem Relation Age of Onset  . Hypertension Other   . Bladder Cancer Mother   . Kidney cancer Neg Hx   . Prostate cancer Neg Hx   . Breast cancer Neg Hx     Social History: Social History   Socioeconomic History  . Marital status: Married    Spouse name: Not on file  . Number of children: Not on file  . Years of education: Not on file  . Highest education level: Not on file  Occupational History  . Not on file  Social Needs  . Financial resource strain: Not on file  . Food insecurity    Worry: Not on file    Inability: Not on file  . Transportation needs    Medical: Not on file    Non-medical: Not on file  Tobacco Use  . Smoking status: Former Research scientist (life sciences)  . Smokeless tobacco: Never Used  Substance and Sexual Activity  . Alcohol use: Yes    Alcohol/week: 0.0 standard drinks    Comment: occasional  . Drug use: No  . Sexual activity: Not on file  Lifestyle  . Physical activity    Days per week: Not on file    Minutes per session: Not on file  . Stress: Not on file  Relationships  . Social Herbalist on phone: Not on file    Gets together: Not on file    Attends religious service: Not on file    Active member of club or organization: Not on file    Attends meetings of clubs or organizations: Not on file    Relationship status: Not on file  . Intimate partner violence    Fear of current or ex partner: Not on file    Emotionally abused: Not on file    Physically abused: Not on file    Forced sexual activity: Not on file  Other Topics Concern  . Not on file  Social History Narrative  . Not on file    Vital Signs: Blood pressure (!) 136/58, pulse 68, resp. rate 16, height 5\' 4"  (1.626 m), weight 206 lb (93.4 kg), SpO2 95 %.  Examination: General Appearance: The patient is well-developed, well-nourished, and in no distress. Skin: Gross inspection of skin  unremarkable. Head: normocephalic, no gross deformities. Eyes: no gross deformities noted. ENT: ears appear grossly normal no exudates. Neck: Supple. No thyromegaly. No LAD. Respiratory: no rhonchi noted at this time. Cardiovascular: Normal S1 and S2 without murmur or rub. Extremities: No cyanosis. pulses are equal. Neurologic: Alert and oriented. No involuntary movements.  LABS: Recent Results (from the past 2160 hour(s))  BUN     Status: None   Collection Time: 11/03/18 10:24 AM  Result Value Ref Range   BUN 15 8 - 23 mg/dL    Comment: Performed at [facility] [address], [place], [place] [address]  Creatinine, serum  Status: None   Collection Time: 11/03/18 10:24 AM  Result Value Ref Range   Creatinine, Ser 0.87 0.44 - 1.00 mg/dL   GFR calc non Af Amer >60 >60 mL/min   GFR calc Af Amer >60 >60 mL/min    Comment: Performed at [facility], [address] [place], [place] [address]    Radiology: Ct Chest W Contrast  Result Date: 11/03/2018 CLINICAL DATA:  Recurrent hemoptysis. EXAM: CT CHEST WITH CONTRAST TECHNIQUE: Multidetector CT imaging of the chest was performed during intravenous contrast administration. CONTRAST:  82mL OMNIPAQUE IOHEXOL 300 MG/ML  SOLN COMPARISON:  10/15/2017 chest CT.  07/14/2018 chest radiograph. FINDINGS: Cardiovascular: Normal heart size. No significant pericardial effusion/thickening. Mildly atherosclerotic nonaneurysmal thoracic aorta. Normal caliber pulmonary arteries. No central pulmonary emboli. Mediastinum/Nodes: No discrete thyroid nodules. Unremarkable esophagus. No pathologically enlarged axillary, mediastinal or hilar lymph nodes. Lungs/Pleura: No pneumothorax. No pleural effusion. No acute consolidative airspace disease, lung masses or significant pulmonary nodules. Stable tiny calcified peripheral right upper lobe granuloma. No significant regions of bronchiectasis. No central airway stenosis. 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 Subcentimeter hypodense renal cortical lesions in the anterior upper left kidney are too small to characterize and require no follow-up. Musculoskeletal: No aggressive appearing focal osseous lesions. Moderate thoracic spondylosis. IMPRESSION: No active disease in the chest. No CT findings to explain the hemoptysis. Aortic Atherosclerosis (ICD10-I70.0). Electronically Signed   By: Ilona Sorrel M.D.   On: 11/03/2018 16:39    No results found.  Ct Chest W Contrast  Result Date: 11/03/2018 CLINICAL DATA:  Recurrent hemoptysis. EXAM: CT CHEST WITH CONTRAST TECHNIQUE: Multidetector CT imaging of the chest was performed during intravenous contrast administration. CONTRAST:  49mL OMNIPAQUE IOHEXOL 300 MG/ML  SOLN COMPARISON:  10/15/2017 chest CT.  07/14/2018 chest radiograph. FINDINGS: Cardiovascular: Normal heart size. No significant pericardial effusion/thickening. Mildly atherosclerotic nonaneurysmal thoracic aorta. Normal caliber pulmonary arteries. No central pulmonary emboli. Mediastinum/Nodes: No discrete thyroid nodules. Unremarkable esophagus. No pathologically enlarged axillary, mediastinal or hilar lymph nodes. Lungs/Pleura: No pneumothorax. No pleural effusion. No acute consolidative airspace disease, lung masses or significant pulmonary nodules. Stable tiny calcified peripheral right upper lobe granuloma. No significant regions of bronchiectasis. No central airway stenosis. 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 Subcentimeter hypodense renal cortical lesions in the anterior upper left kidney are too small to characterize and require no follow-up. Musculoskeletal: No aggressive appearing focal osseous lesions. Moderate thoracic spondylosis. IMPRESSION: No active disease in the chest. No CT findings to explain the hemoptysis. Aortic Atherosclerosis (ICD10-I70.0). Electronically Signed   By:  Ilona Sorrel M.D.   On: 11/03/2018 16:39      Assessment and Plan: Patient Active Problem List   Diagnosis Date Noted  . Generalized anxiety disorder 07/27/2018  . Uncontrolled type 2 diabetes mellitus with hyperglycemia ([place]) 07/27/2018  . Vitamin D deficiency 07/27/2018  . Acute upper respiratory infection 02/28/2018  . Cough 02/28/2018  . Sore throat 02/28/2018  . Seasonal allergic rhinitis due to pollen 02/28/2018  . Migraine without aura and without status migrainosus, not intractable 12/21/2017  . Hypertension 12/21/2017  . Type 2 diabetes mellitus with peripheral neuropathy ([place]) 08/21/2017  . Pain in both feet 08/21/2017  . Neuralgia and neuritis, unspecified 08/21/2017  . Urinary tract infection without hematuria 08/21/2017  . Dysuria 08/21/2017  . Screening for breast cancer 08/21/2017  . Status post bariatric surgery 01/05/2017  . Benign essential HTN 12/05/2015  . Mixed hyperlipidemia 12/05/2015  .  Shortness of breath 12/05/2015  . Primary osteoarthritis of left knee 10/04/2014  . Left knee pain 09/06/2014    1. Chest pain non cardiac and no pulmonary cause identifiable. Patient is doing better now will monitor 2. Hemoptysis basically resolved 3. SOB at baseline  General Counseling: I have discussed the findings of the evaluation and examination with [facility].  I have also discussed any further diagnostic evaluation thatmay be needed or ordered today. Lorien verbalizes understanding of the findings of todays visit. We also reviewed her medications today and discussed drug interactions and side effects including but not limited excessive drowsiness and altered mental states. We also discussed that there is always a risk not just to her but also people around her. she has been encouraged to call the office with any questions or concerns that should arise related to todays visit.    Time spent: 60min  I have personally obtained a history, examined the patient, evaluated  laboratory and imaging results, formulated the assessment and plan and placed orders.    Allyne Gee, MD [facility] Pulmonary and Critical Care Sleep medicine

## 2018-11-08 LAB — CBC
Hematocrit: 41.6 % (ref 34.0–46.6)
Hemoglobin: 14 g/dL (ref 11.1–15.9)
MCH: 31.5 pg (ref 26.6–33.0)
MCHC: 33.7 g/dL (ref 31.5–35.7)
MCV: 94 fL (ref 79–97)
Platelets: 297 10*3/uL (ref 150–450)
RBC: 4.45 x10E6/uL (ref 3.77–5.28)
RDW: 11.6 % — ABNORMAL LOW (ref 11.7–15.4)
WBC: 8.5 10*3/uL (ref 3.4–10.8)

## 2018-11-08 LAB — VITAMIN D 25 HYDROXY (VIT D DEFICIENCY, FRACTURES): Vit D, 25-Hydroxy: 33.7 ng/mL (ref 30.0–100.0)

## 2018-11-08 LAB — COMPREHENSIVE METABOLIC PANEL
ALT: 9 IU/L (ref 0–32)
AST: 14 IU/L (ref 0–40)
Albumin/Globulin Ratio: 2 (ref 1.2–2.2)
Albumin: 4.5 g/dL (ref 3.8–4.8)
Alkaline Phosphatase: 115 IU/L (ref 39–117)
BUN/Creatinine Ratio: 17 (ref 12–28)
BUN: 14 mg/dL (ref 8–27)
Bilirubin Total: 0.3 mg/dL (ref 0.0–1.2)
CO2: 25 mmol/L (ref 20–29)
Calcium: 9.6 mg/dL (ref 8.7–10.3)
Chloride: 105 mmol/L (ref 96–106)
Creatinine, Ser: 0.83 mg/dL (ref 0.57–1.00)
GFR calc Af Amer: 87 mL/min/{1.73_m2} (ref >59)
GFR calc non Af Amer: 75 mL/min/{1.73_m2} (ref >59)
Globulin, Total: 2.2 g/dL (ref 1.5–4.5)
Glucose: 96 mg/dL (ref 65–99)
Potassium: 5.3 mmol/L — ABNORMAL HIGH (ref 3.5–5.2)
Sodium: 142 mmol/L (ref 134–144)
Total Protein: 6.7 g/dL (ref 6.0–8.5)

## 2018-11-08 LAB — LIPID PANEL W/O CHOL/HDL RATIO
Cholesterol, Total: 238 mg/dL — ABNORMAL HIGH (ref 100–199)
HDL: 48 mg/dL (ref >39)
Triglycerides: 450 mg/dL — ABNORMAL HIGH (ref 0–149)

## 2018-11-08 LAB — T3: T3, Total: 97 ng/dL (ref 71–180)

## 2018-11-08 LAB — TSH: TSH: 1.52 u[IU]/mL (ref 0.450–4.500)

## 2018-11-08 LAB — T4, FREE: Free T4: 0.95 ng/dL (ref 0.82–1.77)

## 2018-11-10 ENCOUNTER — Other Ambulatory Visit: Payer: Self-pay

## 2018-11-10 ENCOUNTER — Ambulatory Visit: Payer: BC Managed Care – PPO | Admitting: Nurse Practitioner

## 2018-11-10 ENCOUNTER — Encounter: Payer: Self-pay | Admitting: Nurse Practitioner

## 2018-11-10 VITALS — BP 140/65 | HR 78 | Ht 64.0 in | Wt 206.0 lb

## 2018-11-10 DIAGNOSIS — M792 Neuralgia and neuritis, unspecified: Secondary | ICD-10-CM

## 2018-11-10 DIAGNOSIS — E1142 Type 2 diabetes mellitus with diabetic polyneuropathy: Secondary | ICD-10-CM

## 2018-11-10 DIAGNOSIS — I10 Essential (primary) hypertension: Secondary | ICD-10-CM | POA: Diagnosis not present

## 2018-11-10 MED ORDER — GABAPENTIN 300 MG PO CAPS: ORAL_CAPSULE | ORAL | 1 refills | Status: DC

## 2018-11-10 MED ORDER — HYDROCODONE-ACETAMINOPHEN 7.5-325 MG PO TABS: 1.0000 | ORAL_TABLET | ORAL | 0 refills | Status: DC | PRN

## 2018-11-10 NOTE — Progress Notes (Signed)
[facility] [place], [place] [address]  Internal MEDICINE  Telephone Visit  Patient Name: Wendy Hubbard  [id number]  [id number]  Date of Service: 11/10/2018  I connected with the patient at 9:31am by webcam and verified the patients identity using two identifiers.   I discussed the limitations, risks, security and privacy concerns of performing an evaluation and management service by webcam and the availability of in person appointments. I also discussed with the patient that there may be a patient responsible charge related to the service.  The patient expressed understanding and agrees to proceed.    Chief Complaint  Patient presents with  . Telephone Screen    VIDEO VISIT [phone]  . Telephone Assessment  . Medical Management of Chronic Issues    4 month follow up  . Diabetes    The patient has been contacted via webcam for follow up visit due to concerns for spread of novel coronavirus.  The patient is complaining of neuropathy in both feet. satarted in biig toes. Moved into the arch of her feet and then into the heels. Feeling is reduced, however, they feel like needles and hot pins going throgh them. At night, they are so painful, they hurt to have even a sheet touch the feet. She did see a neurologist once and neuropathy diagnosis was confirmed. Saw pain clinic in [place]. They wanted to do electric stimulation studies. These were entirely too expensive. She does take gabapentin for her feet and at night, she will take hydrocodone/APAP 7.5/325mg  at bedtime which helps sometimes.      Current Medication: Outpatient Encounter Medications as of 11/10/2018  Medication Sig Note  . ALPRAZolam (XANAX) 0.5 MG tablet Take 1 tablet (0.5 mg total) by mouth at bedtime as needed for anxiety.   . butalbital-acetaminophen-caffeine (FIORICET, ESGIC) 50-325-40 MG tablet Take 1-2 tablets by mouth every 6 (six) hours as needed for headache.   . cetirizine (ZYRTEC) 10  MG tablet Take 10 mg by mouth daily.   Marland Kitchen dicyclomine (BENTYL) 10 MG capsule Take 1 capsule (10 mg total) by mouth 4 (four) times daily -  before meals and at bedtime.   . diphenoxylate-atropine (LOMOTIL) 2.5-0.025 MG tablet Take 1 tablet by mouth 4 (four) times daily as needed for diarrhea or loose stools.   Marland Kitchen escitalopram (LEXAPRO) 10 MG tablet Take 1 tablet (10 mg total) by mouth every evening.   . gabapentin (NEURONTIN) 300 MG capsule Take 2 capsule po BID. Then take 3 capsules po QHS. (four capsules daily).   Marland Kitchen HYDROcodone-acetaminophen (NORCO) 7.5-325 MG tablet Take 1 tablet by mouth every 4 (four) hours as needed for moderate pain.   . metoprolol succinate (TOPROL-XL) 50 MG 24 hr tablet Take 1 tablet (50 mg total) by mouth at bedtime. Take with or immediately following a meal.   . montelukast (SINGULAIR) 10 MG tablet Take 1 tablet (10 mg total) by mouth at bedtime.   . pantoprazole (PROTONIX) 40 MG tablet Take one tablet by mouth daily.   Marland Kitchen spironolactone (ALDACTONE) 50 MG tablet Take 50 mg by mouth daily. 08/22/2014: .   Marland Kitchen tiZANidine (ZANAFLEX) 4 MG tablet TAKE 1 TABLET BY MOUTH EVERYDAY AT BEDTIME   . [DISCONTINUED] gabapentin (NEURONTIN) 300 MG capsule Take 1 capsule po BID. Then take 2 capsules po QHS. (four capsules daily).   . [DISCONTINUED] HYDROcodone-acetaminophen (NORCO) 7.5-325 MG tablet Take 1 tablet by mouth every 4 (four) hours as needed for moderate pain.    No facility-administered  encounter medications on file as of 11/10/2018.     Surgical History: Past Surgical History:  Procedure Laterality Date  . ABDOMINAL HYSTERECTOMY    . ANKLE ARTHROSCOPY Bilateral   . APPENDECTOMY    . BLADDER SUSPENSION    . BREAST EXCISIONAL BIOPSY Left 1990   neg  . BREAST EXCISIONAL BIOPSY Right 1989   neg  . BREAST SURGERY    . CHOLECYSTECTOMY    . COLONOSCOPY N/A 11/04/2015   Procedure: COLONOSCOPY;  Surgeon: Manya Silvas, MD;  Location: [facility] ENDOSCOPY;  Service: Endoscopy;   Laterality: N/A;  . ESOPHAGOGASTRODUODENOSCOPY N/A 11/02/2015   Procedure: ESOPHAGOGASTRODUODENOSCOPY (EGD);  Surgeon: Manya Silvas, MD;  Location: [facility] ENDOSCOPY;  Service: Endoscopy;  Laterality: N/A;  . KNEE ARTHROSCOPY Left   . REDUCTION MAMMAPLASTY Bilateral 1988  . TONSILLECTOMY      Medical History: Past Medical History:  Diagnosis Date  . COPD (chronic obstructive pulmonary disease) ([place])   . Diabetes mellitus without complication ([place])   . GERD (gastroesophageal reflux disease)   . Hyperlipidemia   . Hypertension   . Rapid heart rate   . Sleep apnea     Family History: Family History  Problem Relation Age of Onset  . Hypertension Other   . Bladder Cancer Mother   . Kidney cancer Neg Hx   . Prostate cancer Neg Hx   . Breast cancer Neg Hx     Social History   Socioeconomic History  . Marital status: Married    Spouse name: Not on file  . Number of children: Not on file  . Years of education: Not on file  . Highest education level: Not on file  Occupational History  . Not on file  Social Needs  . Financial resource strain: Not on file  . Food insecurity    Worry: Not on file    Inability: Not on file  . Transportation needs    Medical: Not on file    Non-medical: Not on file  Tobacco Use  . Smoking status: Former Research scientist (life sciences)  . Smokeless tobacco: Never Used  Substance and Sexual Activity  . Alcohol use: Yes    Alcohol/week: 0.0 standard drinks    Comment: occasional  . Drug use: No  . Sexual activity: Not on file  Lifestyle  . Physical activity    Days per week: Not on file    Minutes per session: Not on file  . Stress: Not on file  Relationships  . Social Herbalist on phone: Not on file    Gets together: Not on file    Attends religious service: Not on file    Active member of club or organization: Not on file    Attends meetings of clubs or organizations: Not on file    Relationship status: Not on file  . Intimate partner violence     Fear of current or ex partner: Not on file    Emotionally abused: Not on file    Physically abused: Not on file    Forced sexual activity: Not on file  Other Topics Concern  . Not on file  Social History Narrative  . Not on file      Review of Systems  Constitutional: Positive for fatigue. Negative for activity change, chills, diaphoresis and unexpected weight change.  HENT: Negative for ear pain, postnasal drip and sinus pressure.   Respiratory: Negative for cough (with blood ), shortness of breath and wheezing.   Cardiovascular: Negative for  chest pain and palpitations.  Gastrointestinal: Negative for abdominal pain, constipation, diarrhea, nausea and vomiting.  Endocrine: Negative for cold intolerance, heat intolerance, polydipsia and polyuria.  Musculoskeletal: Negative for arthralgias, back pain, gait problem and neck pain.       Neuropathy in both feet .feels like burning sensation. Worse at night, especially when sheets or other material is touching her feet .  Skin: Negative for color change and rash.  Allergic/Immunologic: Negative for environmental allergies and food allergies.  Neurological: Positive for headaches. Negative for dizziness.       Significant neuropathy in both feet.   Hematological: Negative for adenopathy. Does not bruise/bleed easily.  Psychiatric/Behavioral: Negative for agitation, behavioral problems (depression) and hallucinations. The patient is nervous/anxious.    Today's Vitals   11/10/18 0922  BP: 140/65  Pulse: 78  Weight: 206 lb (93.4 kg)  Height: 5\' 4"  (1.626 m)   Body mass index is 35.36 kg/m.  Observation/Objective:   The patient is alert and oriented. She is pleasant and answers all questions appropriately. Breathing is non-labored. She is in no acute distress at this time.    Assessment/Plan: 1. Type 2 diabetes mellitus with peripheral neuropathy (HCC) Blood sugars stable without use of medication. Increase gabapentin to two  capsules in the morning and afternoon and three capsules at bedtime.   2. Neuralgia and neuritis, unspecified Increase gabapentin to two capsules in the morning and afternoon and three capsules at bedtime. May take hydrocodone/APAP 7.5/325mg  tablets at bedtime as needed for severe pain. A new prescription was sent to her pharmacy.  - gabapentin (NEURONTIN) 300 MG capsule; Take 2 capsule po BID. Then take 3 capsules po QHS. (four capsules daily).  Dispense: 450 capsule; Refill: 1 - HYDROcodone-acetaminophen (NORCO) 7.5-325 MG tablet; Take 1 tablet by mouth every 4 (four) hours as needed for moderate pain.  Dispense: 30 tablet; Refill: 0  3. Essential hypertension Stable. Continue bp medication as prescribed   General Counseling: Osie verbalizes understanding of the findings of today's phone visit and agrees with plan of treatment. I have discussed any further diagnostic evaluation that may be needed or ordered today. We also reviewed her medications today. she has been encouraged to call the office with any questions or concerns that should arise related to todays visit.  Reviewed risks and possible side effects associated with taking opiates, benzodiazepines and other CNS depressants. Combination of these could cause dizziness and drowsiness. Advised patient not to drive or operate machinery when taking these medications, as patient's and other's life can be at risk and will have consequences. Patient verbalized understanding in this matter. Dependence and abuse for these drugs will be monitored closely. A Controlled substance policy and procedure is on file which allows [facility] to order a urine drug screen test at any visit. Patient understands and agrees with the plan  This patient was seen by Leretha Pol FNP Collaboration with Dr Lavera Guise as a part of collaborative care agreement  Meds ordered this encounter  Medications  . gabapentin (NEURONTIN) 300 MG capsule    Sig:  Take 2 capsule po BID. Then take 3 capsules po QHS. (four capsules daily).    Dispense:  450 capsule    Refill:  1    Updating prescription to reflect increased dose.    Order Specific Question:   Supervising Provider    Answer:   Lavera Guise [[id number]]  . HYDROcodone-acetaminophen (NORCO) 7.5-325 MG tablet    Sig: Take 1 tablet by  mouth every 4 (four) hours as needed for moderate pain.    Dispense:  30 tablet    Refill:  0    Slight increase in dose    Order Specific Question:   Supervising Provider    Answer:   Lavera Guise [[id number]]    Time spent: 47 Minutes    Dr Lavera Guise Internal medicine

## 2018-11-16 ENCOUNTER — Other Ambulatory Visit: Payer: Self-pay | Admitting: Nurse Practitioner

## 2018-11-16 ENCOUNTER — Telehealth: Payer: Self-pay

## 2018-11-16 DIAGNOSIS — E782 Mixed hyperlipidemia: Secondary | ICD-10-CM

## 2018-11-16 MED ORDER — ROSUVASTATIN CALCIUM 10 MG PO TABS: 10.0000 mg | ORAL_TABLET | Freq: Every day | ORAL | 1 refills | Status: DC

## 2018-11-16 NOTE — Telephone Encounter (Signed)
Informed pt of results and also new Rx sent to pharmacy. Pt stated that If she started to have any side effects from new rx she will not continue to take it.

## 2018-11-16 NOTE — Progress Notes (Signed)
Blood work showing very high triglycerides and cholesterol level. I would like her to start on generic crestor 10mg  to lower these numbers. improtant for heart, diabetes, and kidneys to lower this number. Sent new prescription to pharmacy. Will recheck the cholesterol panel after next visit.

## 2018-11-21 ENCOUNTER — Ambulatory Visit (INDEPENDENT_AMBULATORY_CARE_PROVIDER_SITE_OTHER): Payer: BC Managed Care – PPO | Admitting: Nurse Practitioner

## 2018-11-21 ENCOUNTER — Other Ambulatory Visit: Payer: Self-pay

## 2018-11-21 ENCOUNTER — Encounter: Payer: Self-pay | Admitting: Nurse Practitioner

## 2018-11-21 VITALS — BP 144/58 | HR 96 | Resp 16 | Ht 64.0 in | Wt 206.4 lb

## 2018-11-21 DIAGNOSIS — R300 Dysuria: Secondary | ICD-10-CM | POA: Diagnosis not present

## 2018-11-21 DIAGNOSIS — R319 Hematuria, unspecified: Secondary | ICD-10-CM | POA: Diagnosis not present

## 2018-11-21 DIAGNOSIS — N390 Urinary tract infection, site not specified: Secondary | ICD-10-CM

## 2018-11-21 LAB — POCT URINALYSIS DIPSTICK
Bilirubin, UA: NEGATIVE
Glucose, UA: NEGATIVE
Ketones, UA: NEGATIVE
Nitrite, UA: NEGATIVE
Protein, UA: POSITIVE — AB
Spec Grav, UA: 1.02 (ref 1.010–1.025)
Urobilinogen, UA: 0.2 E.U./dL
pH, UA: 6 (ref 5.0–8.0)

## 2018-11-21 MED ORDER — PHENAZOPYRIDINE HCL 200 MG PO TABS: 200.0000 mg | ORAL_TABLET | Freq: Three times a day (TID) | ORAL | 0 refills | Status: DC | PRN

## 2018-11-21 MED ORDER — SULFAMETHOXAZOLE-TRIMETHOPRIM 800-160 MG PO TABS: 1.0000 | ORAL_TABLET | Freq: Two times a day (BID) | ORAL | 0 refills | Status: DC

## 2018-11-21 NOTE — Progress Notes (Signed)
[facility] [place], [place] [address]  Internal MEDICINE  Office Visit Note  Patient Name: Wendy Hubbard  [id number]  [id number]  Date of Service: 11/27/2018   Pt is here for a sick visit.   Chief Complaint  Patient presents with  . Urinary Tract Infection    frequency, burning sensation     The patient is here for sick visit. She has painful urination, pelvic pain, and urinary frequency. This has been going on for past few days. She has some nausea and reduced appetite. She has not had any vomiting.       Current Medication:  Outpatient Encounter Medications as of 11/21/2018  Medication Sig Note  . ALPRAZolam (XANAX) 0.5 MG tablet Take 1 tablet (0.5 mg total) by mouth at bedtime as needed for anxiety.   . butalbital-acetaminophen-caffeine (FIORICET, ESGIC) 50-325-40 MG tablet Take 1-2 tablets by mouth every 6 (six) hours as needed for headache.   . cetirizine (ZYRTEC) 10 MG tablet Take 10 mg by mouth daily.   Marland Kitchen dicyclomine (BENTYL) 10 MG capsule Take 1 capsule (10 mg total) by mouth 4 (four) times daily -  before meals and at bedtime.   . diphenoxylate-atropine (LOMOTIL) 2.5-0.025 MG tablet Take 1 tablet by mouth 4 (four) times daily as needed for diarrhea or loose stools.   Marland Kitchen escitalopram (LEXAPRO) 10 MG tablet Take 1 tablet (10 mg total) by mouth every evening.   . gabapentin (NEURONTIN) 300 MG capsule Take 2 capsule po BID. Then take 3 capsules po QHS. (four capsules daily).   Marland Kitchen HYDROcodone-acetaminophen (NORCO) 7.5-325 MG tablet Take 1 tablet by mouth every 4 (four) hours as needed for moderate pain.   . metoprolol succinate (TOPROL-XL) 50 MG 24 hr tablet Take 1 tablet (50 mg total) by mouth at bedtime. Take with or immediately following a meal.   . pantoprazole (PROTONIX) 40 MG tablet Take one tablet by mouth daily.   . rosuvastatin (CRESTOR) 10 MG tablet Take 1 tablet (10 mg total) by mouth daily.   Marland Kitchen spironolactone (ALDACTONE) 50 MG tablet  Take 50 mg by mouth daily. 08/22/2014: .   Marland Kitchen tiZANidine (ZANAFLEX) 4 MG tablet TAKE 1 TABLET BY MOUTH EVERYDAY AT BEDTIME   . [DISCONTINUED] montelukast (SINGULAIR) 10 MG tablet Take 1 tablet (10 mg total) by mouth at bedtime.   . phenazopyridine (PYRIDIUM) 200 MG tablet Take 1 tablet (200 mg total) by mouth 3 (three) times daily as needed for pain.   Marland Kitchen sulfamethoxazole-trimethoprim (BACTRIM DS) 800-160 MG tablet Take 1 tablet by mouth 2 (two) times daily.    No facility-administered encounter medications on file as of 11/21/2018.       Medical History: Past Medical History:  Diagnosis Date  . COPD (chronic obstructive pulmonary disease) ([place])   . Diabetes mellitus without complication ([place])   . GERD (gastroesophageal reflux disease)   . Hyperlipidemia   . Hypertension   . Rapid heart rate   . Sleep apnea     Today's Vitals   11/21/18 1522  BP: (!) 144/58  Pulse: 96  Resp: 16  SpO2: 96%  Weight: 206 lb 6.4 oz (93.6 kg)  Height: 5\' 4"  (1.626 m)   Body mass index is 35.43 kg/m.  Review of Systems  Constitutional: Positive for fatigue. Negative for chills, fever and unexpected weight change.  HENT: Negative for congestion, postnasal drip, rhinorrhea, sneezing and sore throat.   Respiratory: Negative for cough, chest tightness and shortness of breath.  Cardiovascular: Negative for chest pain and palpitations.  Gastrointestinal: Positive for nausea. Negative for abdominal pain, constipation, diarrhea and vomiting.  Genitourinary: Positive for dysuria, flank pain, frequency and urgency.  Musculoskeletal: Positive for back pain. Negative for arthralgias, joint swelling and neck pain.  Skin: Negative for rash.  Neurological: Negative.  Negative for tremors and numbness.  Hematological: Negative for adenopathy. Does not bruise/bleed easily.  Psychiatric/Behavioral: Negative for behavioral problems (Depression), sleep disturbance and suicidal ideas. The patient is not  nervous/anxious.     Physical Exam Vitals signs and nursing note reviewed.  Constitutional:      General: She is not in acute distress.    Appearance: She is well-developed. She is ill-appearing. She is not diaphoretic.  HENT:     Head: Normocephalic and atraumatic.     Mouth/Throat:     Pharynx: No oropharyngeal exudate.  Eyes:     Pupils: Pupils are equal, round, and reactive to light.  Neck:     Musculoskeletal: Normal range of motion and neck supple.     Thyroid: No thyromegaly.     Vascular: No JVD.     Trachea: No tracheal deviation.  Cardiovascular:     Rate and Rhythm: Normal rate and regular rhythm.     Heart sounds: Normal heart sounds. No murmur. No friction rub. No gallop.   Pulmonary:     Effort: Pulmonary effort is normal. No respiratory distress.     Breath sounds: Normal breath sounds. No wheezing or rales.  Chest:     Chest wall: No tenderness.  Abdominal:     General: Bowel sounds are normal.     Palpations: Abdomen is soft.     Tenderness: There is abdominal tenderness.  Genitourinary:    Comments: The urine sample is positive for protein and trace blood. There is moderate WBC.  Musculoskeletal: Normal range of motion.  Lymphadenopathy:     Cervical: No cervical adenopathy.  Skin:    General: Skin is warm and dry.  Neurological:     Mental Status: She is alert and oriented to person, place, and time.     Cranial Nerves: No cranial nerve deficit.  Psychiatric:        Behavior: Behavior normal.        Thought Content: Thought content normal.        Judgment: Judgment normal.   Assessment/Plan: 1. Urinary tract infection with hematuria, site unspecified Start bactrim ds twice daily for 10 days. Send urine for culture and sensitivity and adjust antibiotics as indicated.  - phenazopyridine (PYRIDIUM) 200 MG tablet; Take 1 tablet (200 mg total) by mouth 3 (three) times daily as needed for pain.  Dispense: 10 tablet; Refill: 0 -  sulfamethoxazole-trimethoprim (BACTRIM DS) 800-160 MG tablet; Take 1 tablet by mouth 2 (two) times daily.  Dispense: 20 tablet; Refill: 0  2. Dysuria Pyridium 200mg  up to three times daily as needed for bladder pain/spasms.  - POCT Urinalysis Dipstick - CULTURE, URINE COMPREHENSIVE - phenazopyridine (PYRIDIUM) 200 MG tablet; Take 1 tablet (200 mg total) by mouth 3 (three) times daily as needed for pain.  Dispense: 10 tablet; Refill: 0  General Counseling: Kolby verbalizes understanding of the findings of todays visit and agrees with plan of treatment. I have discussed any further diagnostic evaluation that may be needed or ordered today. We also reviewed her medications today. she has been encouraged to call the office with any questions or concerns that should arise related to todays visit.    Counseling:  This patient was seen by Leretha Pol FNP Collaboration with Dr Lavera Guise as a part of collaborative care agreement  Orders Placed This Encounter  Procedures  . CULTURE, URINE COMPREHENSIVE  . POCT Urinalysis Dipstick    Meds ordered this encounter  Medications  . phenazopyridine (PYRIDIUM) 200 MG tablet    Sig: Take 1 tablet (200 mg total) by mouth 3 (three) times daily as needed for pain.    Dispense:  10 tablet    Refill:  0    Order Specific Question:   Supervising Provider    Answer:   Lavera Guise [[id number]]  . sulfamethoxazole-trimethoprim (BACTRIM DS) 800-160 MG tablet    Sig: Take 1 tablet by mouth 2 (two) times daily.    Dispense:  20 tablet    Refill:  0    Order Specific Question:   Supervising Provider    Answer:   Lavera Guise [[id number]]    Time spent: 25 Minutes

## 2018-11-25 ENCOUNTER — Other Ambulatory Visit: Payer: Self-pay

## 2018-11-25 DIAGNOSIS — J301 Allergic rhinitis due to pollen: Secondary | ICD-10-CM

## 2018-11-25 LAB — CULTURE, URINE COMPREHENSIVE

## 2018-11-25 MED ORDER — MONTELUKAST SODIUM 10 MG PO TABS: 10.0000 mg | ORAL_TABLET | Freq: Every day | ORAL | 3 refills | Status: DC

## 2018-12-19 ENCOUNTER — Other Ambulatory Visit: Payer: Self-pay | Admitting: Nurse Practitioner

## 2018-12-19 DIAGNOSIS — M792 Neuralgia and neuritis, unspecified: Secondary | ICD-10-CM

## 2018-12-19 MED ORDER — GABAPENTIN 300 MG PO CAPS: ORAL_CAPSULE | ORAL | 1 refills | Status: DC

## 2018-12-19 MED ORDER — METOPROLOL SUCCINATE ER 50 MG PO TB24: 50.0000 mg | ORAL_TABLET | Freq: Every day | ORAL | 1 refills | Status: DC

## 2018-12-27 ENCOUNTER — Other Ambulatory Visit: Payer: Self-pay

## 2018-12-27 MED ORDER — PANTOPRAZOLE SODIUM 40 MG PO TBEC: DELAYED_RELEASE_TABLET | ORAL | 3 refills | Status: DC

## 2019-01-04 ENCOUNTER — Other Ambulatory Visit: Payer: Self-pay

## 2019-01-04 MED ORDER — ESCITALOPRAM OXALATE 10 MG PO TABS: 10.0000 mg | ORAL_TABLET | Freq: Every evening | ORAL | 1 refills | Status: DC

## 2019-01-12 ENCOUNTER — Other Ambulatory Visit: Payer: Self-pay | Admitting: Nurse Practitioner

## 2019-01-12 DIAGNOSIS — F411 Generalized anxiety disorder: Secondary | ICD-10-CM

## 2019-01-12 MED ORDER — ALPRAZOLAM 0.5 MG PO TABS: 0.5000 mg | ORAL_TABLET | Freq: Every evening | ORAL | 3 refills | Status: DC | PRN

## 2019-01-12 NOTE — Progress Notes (Signed)
Approved renewela for alprazolam 0.5mg  at bedtime as needed per pharmacy request.

## 2019-01-17 DIAGNOSIS — H00021 Hordeolum internum right upper eyelid: Secondary | ICD-10-CM | POA: Diagnosis not present

## 2019-01-18 ENCOUNTER — Telehealth: Payer: Self-pay

## 2019-01-18 NOTE — Telephone Encounter (Signed)
Pt called that eye dr want gave her keflex she like to check with heather as per heather advised pt possible cross reaction to keflex since she is allergic to penicillins so discuss with prescribing doctor for med

## 2019-01-24 ENCOUNTER — Other Ambulatory Visit: Payer: Self-pay | Admitting: Nurse Practitioner

## 2019-01-24 ENCOUNTER — Other Ambulatory Visit: Payer: Self-pay

## 2019-01-24 DIAGNOSIS — M792 Neuralgia and neuritis, unspecified: Secondary | ICD-10-CM

## 2019-01-24 MED ORDER — GABAPENTIN 300 MG PO CAPS: ORAL_CAPSULE | ORAL | 1 refills | Status: DC

## 2019-01-24 MED ORDER — TIZANIDINE HCL 4 MG PO TABS: ORAL_TABLET | ORAL | 5 refills | Status: DC

## 2019-01-24 NOTE — Telephone Encounter (Signed)
Spoke with heather pt gabapentin  2 tab bid and 3 qhs and send new rx

## 2019-02-13 ENCOUNTER — Ambulatory Visit: Payer: BC Managed Care – PPO | Admitting: Nurse Practitioner

## 2019-02-13 ENCOUNTER — Encounter: Payer: Self-pay | Admitting: Nurse Practitioner

## 2019-02-13 ENCOUNTER — Other Ambulatory Visit: Payer: Self-pay

## 2019-02-13 DIAGNOSIS — Z20828 Contact with and (suspected) exposure to other viral communicable diseases: Secondary | ICD-10-CM | POA: Diagnosis not present

## 2019-02-13 DIAGNOSIS — Z20822 Contact with and (suspected) exposure to covid-19: Secondary | ICD-10-CM | POA: Insufficient documentation

## 2019-02-13 DIAGNOSIS — N390 Urinary tract infection, site not specified: Secondary | ICD-10-CM | POA: Diagnosis not present

## 2019-02-13 DIAGNOSIS — R319 Hematuria, unspecified: Secondary | ICD-10-CM

## 2019-02-13 DIAGNOSIS — R300 Dysuria: Secondary | ICD-10-CM | POA: Diagnosis not present

## 2019-02-13 MED ORDER — PHENAZOPYRIDINE HCL 200 MG PO TABS: 200.0000 mg | ORAL_TABLET | Freq: Three times a day (TID) | ORAL | 0 refills | Status: DC | PRN

## 2019-02-13 MED ORDER — SULFAMETHOXAZOLE-TRIMETHOPRIM 800-160 MG PO TABS: 1.0000 | ORAL_TABLET | Freq: Two times a day (BID) | ORAL | 0 refills | Status: DC

## 2019-02-13 NOTE — Progress Notes (Signed)
[facility] [place], [place] [address]  Internal MEDICINE  Telephone Visit  Patient Name: Wendy Hubbard  [id number]  [id number]  Date of Service: 02/13/2019  I connected with the patient at 4:44pm by webcam and verified the patients identity using two identifiers.   I discussed the limitations, risks, security and privacy concerns of performing an evaluation and management service by webcam and the availability of in person appointments. I also discussed with the patient that there may be a patient responsible charge related to the service.  The patient expressed understanding and agrees to proceed.    Chief Complaint  Patient presents with  . Telephone Assessment    exposure to covid  . Telephone Screen  . Sore Throat    for last three weeks  . Dysuria    pain after using bathroom, urinary incontinence    The patient has been contacted via webcam for follow up visit due to concerns for spread of novel coronavirus. The patient was near her granddaughter and daughter last week. Granddaughter tested positive for COVID 19 on Tuesday. The patient's daughter was tested, and was negative on Wednesday. The patient states that she has a sore throat. She denies fever, cough, chest congestion, or headache. She is concerned because her elderly father lives with her and she does not want to unexpectedly infect him.  She also states that she has UTI. She states that she has pressure in the bladder and pain when she urinates. She has been taking previously prescribed pyridium which has helped, just a little.       Current Medication: Outpatient Encounter Medications as of 02/13/2019  Medication Sig Note  . ALPRAZolam (XANAX) 0.5 MG tablet Take 1 tablet (0.5 mg total) by mouth at bedtime as needed for anxiety.   . butalbital-acetaminophen-caffeine (FIORICET, ESGIC) 50-325-40 MG tablet Take 1-2 tablets by mouth every 6 (six) hours as needed for headache.   . cetirizine  (ZYRTEC) 10 MG tablet Take 10 mg by mouth daily.   Marland Kitchen dicyclomine (BENTYL) 10 MG capsule Take 1 capsule (10 mg total) by mouth 4 (four) times daily -  before meals and at bedtime.   . diphenoxylate-atropine (LOMOTIL) 2.5-0.025 MG tablet Take 1 tablet by mouth 4 (four) times daily as needed for diarrhea or loose stools.   Marland Kitchen escitalopram (LEXAPRO) 10 MG tablet Take 1 tablet (10 mg total) by mouth every evening.   . gabapentin (NEURONTIN) 300 MG capsule Take 2 capsule po BID. Then take 3 capsules po QHS.   Marland Kitchen HYDROcodone-acetaminophen (NORCO) 7.5-325 MG tablet Take 1 tablet by mouth every 4 (four) hours as needed for moderate pain.   . metoprolol succinate (TOPROL-XL) 50 MG 24 hr tablet Take 1 tablet (50 mg total) by mouth at bedtime. Take with or immediately following a meal.   . montelukast (SINGULAIR) 10 MG tablet Take 1 tablet (10 mg total) by mouth at bedtime.   . pantoprazole (PROTONIX) 40 MG tablet Take one tablet by mouth daily.   . phenazopyridine (PYRIDIUM) 200 MG tablet Take 1 tablet (200 mg total) by mouth 3 (three) times daily as needed for pain.   . rosuvastatin (CRESTOR) 10 MG tablet Take 1 tablet (10 mg total) by mouth daily.   Marland Kitchen spironolactone (ALDACTONE) 50 MG tablet Take 50 mg by mouth daily. 08/22/2014: .   . sulfamethoxazole-trimethoprim (BACTRIM DS) 800-160 MG tablet Take 1 tablet by mouth 2 (two) times daily.   Marland Kitchen tiZANidine (ZANAFLEX) 4 MG tablet TAKE  1 TABLET BY MOUTH EVERYDAY AT BEDTIME   . [DISCONTINUED] phenazopyridine (PYRIDIUM) 200 MG tablet Take 1 tablet (200 mg total) by mouth 3 (three) times daily as needed for pain.   . [DISCONTINUED] sulfamethoxazole-trimethoprim (BACTRIM DS) 800-160 MG tablet Take 1 tablet by mouth 2 (two) times daily.    No facility-administered encounter medications on file as of 02/13/2019.     Surgical History: Past Surgical History:  Procedure Laterality Date  . ABDOMINAL HYSTERECTOMY    . ANKLE ARTHROSCOPY Bilateral   . APPENDECTOMY    .  BLADDER SUSPENSION    . BREAST EXCISIONAL BIOPSY Left 1990   neg  . BREAST EXCISIONAL BIOPSY Right 1989   neg  . BREAST SURGERY    . CHOLECYSTECTOMY    . COLONOSCOPY N/A 11/04/2015   Procedure: COLONOSCOPY;  Surgeon: Manya Silvas, MD;  Location: [facility] ENDOSCOPY;  Service: Endoscopy;  Laterality: N/A;  . ESOPHAGOGASTRODUODENOSCOPY N/A 11/02/2015   Procedure: ESOPHAGOGASTRODUODENOSCOPY (EGD);  Surgeon: Manya Silvas, MD;  Location: [facility] ENDOSCOPY;  Service: Endoscopy;  Laterality: N/A;  . KNEE ARTHROSCOPY Left   . REDUCTION MAMMAPLASTY Bilateral 1988  . TONSILLECTOMY      Medical History: Past Medical History:  Diagnosis Date  . COPD (chronic obstructive pulmonary disease) ([place])   . Diabetes mellitus without complication ([place])   . GERD (gastroesophageal reflux disease)   . Hyperlipidemia   . Hypertension   . Rapid heart rate   . Sleep apnea     Family History: Family History  Problem Relation Age of Onset  . Hypertension Other   . Bladder Cancer Mother   . Kidney cancer Neg Hx   . Prostate cancer Neg Hx   . Breast cancer Neg Hx     Social History   Socioeconomic History  . Marital status: Married    Spouse name: Not on file  . Number of children: Not on file  . Years of education: Not on file  . Highest education level: Not on file  Occupational History  . Not on file  Social Needs  . Financial resource strain: Not on file  . Food insecurity    Worry: Not on file    Inability: Not on file  . Transportation needs    Medical: Not on file    Non-medical: Not on file  Tobacco Use  . Smoking status: Former Research scientist (life sciences)  . Smokeless tobacco: Never Used  Substance and Sexual Activity  . Alcohol use: Yes    Alcohol/week: 0.0 standard drinks    Comment: occasional  . Drug use: No  . Sexual activity: Not on file  Lifestyle  . Physical activity    Days per week: Not on file    Minutes per session: Not on file  . Stress: Not on file  Relationships  . Social  Herbalist on phone: Not on file    Gets together: Not on file    Attends religious service: Not on file    Active member of club or organization: Not on file    Attends meetings of clubs or organizations: Not on file    Relationship status: Not on file  . Intimate partner violence    Fear of current or ex partner: Not on file    Emotionally abused: Not on file    Physically abused: Not on file    Forced sexual activity: Not on file  Other Topics Concern  . Not on file  Social History Narrative  .  Not on file      Review of Systems  Constitutional: Positive for fatigue. Negative for chills, fever and unexpected weight change.  HENT: Positive for sore throat. Negative for congestion, postnasal drip, rhinorrhea and sneezing.   Respiratory: Negative for cough, chest tightness, shortness of breath and wheezing.   Cardiovascular: Negative for chest pain and palpitations.  Gastrointestinal: Negative for abdominal pain, constipation, diarrhea, nausea and vomiting.  Genitourinary: Positive for dysuria, frequency and pelvic pain.  Musculoskeletal: Negative for arthralgias, back pain, joint swelling and neck pain.  Skin: Negative for rash.  Neurological: Negative.  Negative for tremors and numbness.  Hematological: Negative for adenopathy. Does not bruise/bleed easily.  Psychiatric/Behavioral: Negative for behavioral problems (Depression), sleep disturbance and suicidal ideas. The patient is not nervous/anxious.     Vital Signs: There were no vitals taken for this visit.   Observation/Objective:   The patient is alert and oriented. She is pleasant and answers all questions appropriately. Breathing is non-labored. She is in no acute distress at this time.    Assessment/Plan: 1. Urinary tract infection with hematuria, site unspecified Start bactrim DS bid for 10 days. Add pyridium 200mg  up o TID prn bladder pain/spasms.  - sulfamethoxazole-trimethoprim (BACTRIM DS)  800-160 MG tablet; Take 1 tablet by mouth 2 (two) times daily.  Dispense: 20 tablet; Refill: 0 - phenazopyridine (PYRIDIUM) 200 MG tablet; Take 1 tablet (200 mg total) by mouth 3 (three) times daily as needed for pain.  Dispense: 10 tablet; Refill: 0  2. Dysuria Add pyridium 200mg  up o TID prn bladder pain/spasms.  - phenazopyridine (PYRIDIUM) 200 MG tablet; Take 1 tablet (200 mg total) by mouth 3 (three) times daily as needed for pain.  Dispense: 10 tablet; Refill: 0  3. Close exposure to COVID-19 virus Will send for COVD 19 testing.  - Novel Coronavirus, NAA ([facility])  General Counseling: [place] verbalizes understanding of the findings of today's phone visit and agrees with plan of treatment. I have discussed any further diagnostic evaluation that may be needed or ordered today. We also reviewed her medications today. she has been encouraged to call the office with any questions or concerns that should arise related to todays visit.     Person Under Monitoring Name: Wendy Hubbard  Location: [address] [place] [id number]   Infection Prevention Recommendations for Individuals Confirmed to have, or Being Evaluated for, 2019 Novel Coronavirus (COVID-19) Infection Who Receive Care at Home  Individuals who are confirmed to have, or are being evaluated for, COVID-19 should follow the prevention steps below until a healthcare provider or local or state health department says they can return to normal activities.  Stay home except to get medical care You should restrict activities outside your home, except for getting medical care. Do not go to work, school, or public areas, and do not use public transportation or taxis.  Call ahead before visiting your doctor Before your medical appointment, call the healthcare provider and tell them that you have, or are being evaluated for, COVID-19 infection. This will help the healthcare provider's office take steps to keep other people from  getting infected. Ask your healthcare provider to call the local or state health department.  Monitor your symptoms Seek prompt medical attention if your illness is worsening (e.g., difficulty breathing). Before going to your medical appointment, call the healthcare provider and tell them that you have, or are being evaluated for, COVID-19 infection. Ask your healthcare provider to call the local or state health department.  Wear a facemask You should wear a facemask that covers your nose and mouth when you are in the same room with other people and when you visit a healthcare provider. People who live with or visit you should also wear a facemask while they are in the same room with you.  Separate yourself from other people in your home As much as possible, you should stay in a different room from other people in your home. Also, you should use a separate bathroom, if available.  Avoid sharing household items You should not share dishes, drinking glasses, cups, eating utensils, towels, bedding, or other items with other people in your home. After using these items, you should wash them thoroughly with soap and water.  Cover your coughs and sneezes Cover your mouth and nose with a tissue when you cough or sneeze, or you can cough or sneeze into your sleeve. Throw used tissues in a lined trash can, and immediately wash your hands with soap and water for at least 20 seconds or use an alcohol-based hand rub.  Wash your [facility] your hands often and thoroughly with soap and water for at least 20 seconds. You can use an alcohol-based hand sanitizer if soap and water are not available and if your hands are not visibly dirty. Avoid touching your eyes, nose, and mouth with unwashed hands.   Prevention Steps for Caregivers and Household Members of Individuals Confirmed to have, or Being Evaluated for, COVID-19 Infection Being Cared for in the Home  If you live with, or provide care at  home for, a person confirmed to have, or being evaluated for, COVID-19 infection please follow these guidelines to prevent infection:  Follow healthcare provider's instructions Make sure that you understand and can help the patient follow any healthcare provider instructions for all care.  Provide for the patient's basic needs You should help the patient with basic needs in the home and provide support for getting groceries, prescriptions, and other personal needs.  Monitor the patient's symptoms If they are getting sicker, call his or her medical provider and tell them that the patient has, or is being evaluated for, COVID-19 infection. This will help the healthcare provider's office take steps to keep other people from getting infected. Ask the healthcare provider to call the local or state health department.  Limit the number of people who have contact with the patient  If possible, have only one caregiver for the patient.  Other household members should stay in another home or place of residence. If this is not possible, they should stay  in another room, or be separated from the patient as much as possible. Use a separate bathroom, if available.  Restrict visitors who do not have an essential need to be in the home.  Keep older adults, very young children, and other sick people away from the patient Keep older adults, very young children, and those who have compromised immune systems or chronic health conditions away from the patient. This includes people with chronic heart, lung, or kidney conditions, diabetes, and cancer.  Ensure good ventilation Make sure that shared spaces in the home have good air flow, such as from an air conditioner or an opened window, weather permitting.  Wash your hands often  Wash your hands often and thoroughly with soap and water for at least 20 seconds. You can use an alcohol based hand sanitizer if soap and water are not available and if your  hands are not visibly dirty.  Avoid touching your eyes, nose, and mouth with unwashed hands.  Use disposable paper towels to dry your hands. If not available, use dedicated cloth towels and replace them when they become wet.  Wear a facemask and gloves  Wear a disposable facemask at all times in the room and gloves when you touch or have contact with the patient's blood, body fluids, and/or secretions or excretions, such as sweat, saliva, sputum, nasal mucus, vomit, urine, or feces.  Ensure the mask fits over your nose and mouth tightly, and do not touch it during use.  Throw out disposable facemasks and gloves after using them. Do not reuse.  Wash your hands immediately after removing your facemask and gloves.  If your personal clothing becomes contaminated, carefully remove clothing and launder. Wash your hands after handling contaminated clothing.  Place all used disposable facemasks, gloves, and other waste in a lined container before disposing them with other household waste.  Remove gloves and wash your hands immediately after handling these items.  Do not share dishes, glasses, or other household items with the patient  Avoid sharing household items. You should not share dishes, drinking glasses, cups, eating utensils, towels, bedding, or other items with a patient who is confirmed to have, or being evaluated for, COVID-19 infection.  After the person uses these items, you should wash them thoroughly with soap and water.  Wash laundry thoroughly  Immediately remove and wash clothes or bedding that have blood, body fluids, and/or secretions or excretions, such as sweat, saliva, sputum, nasal mucus, vomit, urine, or feces, on them.  Wear gloves when handling laundry from the patient.  Read and follow directions on labels of laundry or clothing items and detergent. In general, wash and dry with the warmest temperatures recommended on the label.  Clean all areas the individual  has used often  Clean all touchable surfaces, such as counters, tabletops, doorknobs, bathroom fixtures, toilets, phones, keyboards, tablets, and bedside tables, every day. Also, clean any surfaces that may have blood, body fluids, and/or secretions or excretions on them.  Wear gloves when cleaning surfaces the patient has come in contact with.  Use a diluted bleach solution (e.g., dilute bleach with 1 part bleach and 10 parts water) or a household disinfectant with a label that says EPA-registered for coronaviruses. To make a bleach solution at home, add 1 tablespoon of bleach to 1 quart (4 cups) of water. For a larger supply, add  cup of bleach to 1 gallon (16 cups) of water.  Read labels of cleaning products and follow recommendations provided on product labels. Labels contain instructions for safe and effective use of the cleaning product including precautions you should take when applying the product, such as wearing gloves or eye protection and making sure you have good ventilation during use of the product.  Remove gloves and wash hands immediately after cleaning.  Monitor yourself for signs and symptoms of illness Caregivers and household members are considered close contacts, should monitor their health, and will be asked to limit movement outside of the home to the extent possible. Follow the monitoring steps for close contacts listed on the symptom monitoring form.   ? If you have additional questions, contact your local health department or call the epidemiologist on call at [phone] (available 24/7). ? This guidance is subject to change. For the most up-to-date guidance from [facility], please refer to their website: YouBlogs.pl  Orders Placed This Encounter  Procedures  . Novel Coronavirus, NAA ([facility])  Meds ordered this encounter  Medications  . sulfamethoxazole-trimethoprim (BACTRIM DS) 800-160 MG tablet     Sig: Take 1 tablet by mouth 2 (two) times daily.    Dispense:  20 tablet    Refill:  0    Order Specific Question:   Supervising Provider    Answer:   Lavera Guise [id number]  . phenazopyridine (PYRIDIUM) 200 MG tablet    Sig: Take 1 tablet (200 mg total) by mouth 3 (three) times daily as needed for pain.    Dispense:  10 tablet    Refill:  0    Order Specific Question:   Supervising Provider    Answer:   Lavera Guise [id number]    Time spent: 42 Minutes    Dr Lavera Guise Internal medicine

## 2019-02-14 ENCOUNTER — Other Ambulatory Visit: Payer: Self-pay

## 2019-02-14 DIAGNOSIS — Z20828 Contact with and (suspected) exposure to other viral communicable diseases: Secondary | ICD-10-CM | POA: Diagnosis not present

## 2019-02-14 DIAGNOSIS — Z20822 Contact with and (suspected) exposure to covid-19: Secondary | ICD-10-CM

## 2019-02-16 ENCOUNTER — Ambulatory Visit: Payer: BC Managed Care – PPO | Admitting: Nurse Practitioner

## 2019-02-16 LAB — NOVEL CORONAVIRUS, NAA: SARS-CoV-2, NAA: NOT DETECTED

## 2019-02-17 ENCOUNTER — Encounter: Payer: Self-pay | Admitting: Nurse Practitioner

## 2019-02-17 ENCOUNTER — Telehealth: Payer: Self-pay

## 2019-02-17 ENCOUNTER — Other Ambulatory Visit: Payer: Self-pay

## 2019-02-17 ENCOUNTER — Ambulatory Visit: Payer: BC Managed Care – PPO | Admitting: Nurse Practitioner

## 2019-02-17 DIAGNOSIS — M792 Neuralgia and neuritis, unspecified: Secondary | ICD-10-CM

## 2019-02-17 DIAGNOSIS — I10 Essential (primary) hypertension: Secondary | ICD-10-CM

## 2019-02-17 DIAGNOSIS — N390 Urinary tract infection, site not specified: Secondary | ICD-10-CM

## 2019-02-17 DIAGNOSIS — Z20828 Contact with and (suspected) exposure to other viral communicable diseases: Secondary | ICD-10-CM | POA: Diagnosis not present

## 2019-02-17 DIAGNOSIS — Z20822 Contact with and (suspected) exposure to covid-19: Secondary | ICD-10-CM

## 2019-02-17 DIAGNOSIS — E1142 Type 2 diabetes mellitus with diabetic polyneuropathy: Secondary | ICD-10-CM | POA: Diagnosis not present

## 2019-02-17 MED ORDER — HYDROCODONE-ACETAMINOPHEN 7.5-325 MG PO TABS: 1.0000 | ORAL_TABLET | ORAL | 0 refills | Status: DC | PRN

## 2019-02-17 NOTE — Telephone Encounter (Signed)
Pt advised covid test  Negative

## 2019-02-17 NOTE — Progress Notes (Signed)
[facility] [place], [place] [address]  Internal MEDICINE  Telephone Visit  Patient Name: Wendy HENDRIXSON  [id number]  [id number]  Date of Service: 02/27/2019  I connected with the patient at 4:04pm by webcam and verified the patients identity using two identifiers.   I discussed the limitations, risks, security and privacy concerns of performing an evaluation and management service by webcam and the availability of in person appointments. I also discussed with the patient that there may be a patient responsible charge related to the service.  The patient expressed understanding and agrees to proceed.    Chief Complaint  Patient presents with  . Telephone Assessment  . Telephone Screen  . Diabetes  . Hypertension  . Hyperlipidemia  . Quality Metric Gaps    pt is due for an a1c  . Fatigue    pt states she has been feeling extremely tired    The patient has been contacted via webcam for follow up visit due to concerns for spread of novel coronavirus. The patient was seen at her last visit for exposure to [place] 19 from her granddaughter who had tested positive. The patient was tested and her results were negative. She was treated for urinary tract infection. She states that symptoms have resolved.  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 Blood sugars remain very well controlled without medication. Her feet feel like needles and hot pins going throgh them. At night, they are so painful, they hurt to have even a sheet touch the feet. She did see a neurologist once and neuropathy diagnsosis was confirmed. She does take gabapentin for her feet and at night, she will take hydrocodone/APAP 7.5/325mg  at bedtime which helps sometimes. She does need to have a refill for this today.        Current Medication: Outpatient Encounter Medications as of 02/17/2019  Medication Sig Note  . ALPRAZolam (XANAX) 0.5  MG tablet Take 1 tablet (0.5 mg total) by mouth at bedtime as needed for anxiety.   . butalbital-acetaminophen-caffeine (FIORICET, ESGIC) 50-325-40 MG tablet Take 1-2 tablets by mouth every 6 (six) hours as needed for headache.   . cetirizine (ZYRTEC) 10 MG tablet Take 10 mg by mouth daily.   Marland Kitchen dicyclomine (BENTYL) 10 MG capsule Take 1 capsule (10 mg total) by mouth 4 (four) times daily -  before meals and at bedtime.   . diphenoxylate-atropine (LOMOTIL) 2.5-0.025 MG tablet Take 1 tablet by mouth 4 (four) times daily as needed for diarrhea or loose stools.   Marland Kitchen escitalopram (LEXAPRO) 10 MG tablet Take 1 tablet (10 mg total) by mouth every evening.   . gabapentin (NEURONTIN) 300 MG capsule Take 2 capsule po BID. Then take 3 capsules po QHS.   Marland Kitchen HYDROcodone-acetaminophen (NORCO) 7.5-325 MG tablet Take 1 tablet by mouth every 4 (four) hours as needed for moderate pain.   . metoprolol succinate (TOPROL-XL) 50 MG 24 hr tablet Take 1 tablet (50 mg total) by mouth at bedtime. Take with or immediately following a meal.   . pantoprazole (PROTONIX) 40 MG tablet Take one tablet by mouth daily.   . phenazopyridine (PYRIDIUM) 200 MG tablet Take 1 tablet (200 mg total) by mouth 3 (three) times daily as needed for pain.   . rosuvastatin (CRESTOR) 10 MG tablet Take 1 tablet (10 mg total) by mouth daily.   Marland Kitchen spironolactone (ALDACTONE) 50 MG tablet Take 50 mg by mouth daily.  08/22/2014: .   . sulfamethoxazole-trimethoprim (BACTRIM DS) 800-160 MG tablet Take 1 tablet by mouth 2 (two) times daily.   Marland Kitchen tiZANidine (ZANAFLEX) 4 MG tablet TAKE 1 TABLET BY MOUTH EVERYDAY AT BEDTIME   . [DISCONTINUED] HYDROcodone-acetaminophen (NORCO) 7.5-325 MG tablet Take 1 tablet by mouth every 4 (four) hours as needed for moderate pain.   . [DISCONTINUED] montelukast (SINGULAIR) 10 MG tablet Take 1 tablet (10 mg total) by mouth at bedtime.    No facility-administered encounter medications on file as of 02/17/2019.     Surgical  History: Past Surgical History:  Procedure Laterality Date  . ABDOMINAL HYSTERECTOMY    . ANKLE ARTHROSCOPY Bilateral   . APPENDECTOMY    . BLADDER SUSPENSION    . BREAST EXCISIONAL BIOPSY Left 1990   neg  . BREAST EXCISIONAL BIOPSY Right 1989   neg  . BREAST SURGERY    . CHOLECYSTECTOMY    . COLONOSCOPY N/A 11/04/2015   Procedure: COLONOSCOPY;  Surgeon: Manya Silvas, MD;  Location: [facility] ENDOSCOPY;  Service: Endoscopy;  Laterality: N/A;  . ESOPHAGOGASTRODUODENOSCOPY N/A 11/02/2015   Procedure: ESOPHAGOGASTRODUODENOSCOPY (EGD);  Surgeon: Manya Silvas, MD;  Location: [facility] ENDOSCOPY;  Service: Endoscopy;  Laterality: N/A;  . KNEE ARTHROSCOPY Left   . REDUCTION MAMMAPLASTY Bilateral 1988  . TONSILLECTOMY      Medical History: Past Medical History:  Diagnosis Date  . COPD (chronic obstructive pulmonary disease) ([place])   . Diabetes mellitus without complication ([place])   . GERD (gastroesophageal reflux disease)   . Hyperlipidemia   . Hypertension   . Rapid heart rate   . Sleep apnea     Family History: Family History  Problem Relation Age of Onset  . Hypertension Other   . Bladder Cancer Mother   . Kidney cancer Neg Hx   . Prostate cancer Neg Hx   . Breast cancer Neg Hx     Social History   Socioeconomic History  . Marital status: Married    Spouse name: Not on file  . Number of children: Not on file  . Years of education: Not on file  . Highest education level: Not on file  Occupational History  . Not on file  Social Needs  . Financial resource strain: Not on file  . Food insecurity    Worry: Not on file    Inability: Not on file  . Transportation needs    Medical: Not on file    Non-medical: Not on file  Tobacco Use  . Smoking status: Former Research scientist (life sciences)  . Smokeless tobacco: Never Used  . Tobacco comment: quit in 1996  Substance and Sexual Activity  . Alcohol use: Yes    Alcohol/week: 0.0 standard drinks    Comment: occasional  . Drug use: No  . Sexual  activity: Not on file  Lifestyle  . Physical activity    Days per week: Not on file    Minutes per session: Not on file  . Stress: Not on file  Relationships  . Social Herbalist on phone: Not on file    Gets together: Not on file    Attends religious service: Not on file    Active member of club or organization: Not on file    Attends meetings of clubs or organizations: Not on file    Relationship status: Not on file  . Intimate partner violence    Fear of current or ex partner: Not on file    Emotionally abused: Not  on file    Physically abused: Not on file    Forced sexual activity: Not on file  Other Topics Concern  . Not on file  Social History Narrative  . Not on file      Review of Systems  Constitutional: Positive for fatigue. Negative for chills and unexpected weight change.  HENT: Negative for congestion, postnasal drip, rhinorrhea, sneezing and sore throat.   Respiratory: Negative for cough, chest tightness, shortness of breath and wheezing.   Cardiovascular: Negative for chest pain and palpitations.  Gastrointestinal: Negative for abdominal pain, constipation, diarrhea, nausea and vomiting.  Endocrine: Negative for cold intolerance, heat intolerance, polydipsia and polyuria.       Continues to have good control of blood sugars without use of diabetic medication.   Genitourinary: Negative for dysuria and frequency.       Uti symptoms have resolved.   Musculoskeletal: Positive for arthralgias and myalgias. Negative for back pain, joint swelling and neck pain.        Neuropathy in both feet .feels like burning sensation. Worse at night, especially when sheets or other material is touching her feet .   Skin: Negative for rash.  Allergic/Immunologic: Negative for environmental allergies.  Neurological: Positive for numbness. Negative for tremors.       Numbness with significant neuropathy in both feet.   Hematological: Negative for adenopathy. Does not  bruise/bleed easily.  Psychiatric/Behavioral: Negative for behavioral problems (Depression), sleep disturbance and suicidal ideas. The patient is not nervous/anxious.     Vital Signs: There were no vitals taken for this visit.   Observation/Objective:   The patient is alert and oriented. She is pleasant and answers all questions appropriately. Breathing is non-labored. She is in no acute distress at this time.    Assessment/Plan:  1. Type 2 diabetes mellitus with peripheral neuropathy (HCC) Blood sugars are well controlled without use of diabetic medication. Will check HgbA1c at her next visit. Continue to take gabapentin as prescribed to reduce diabetic neuropathy.   2. Neuralgia and neuritis, unspecified Continue gabapentin as prescribed. May take hydrocodone/APAP 7.5/325mg  tablets at bedtime as needed for severe pain associated with neuropathy.  - HYDROcodone-acetaminophen (NORCO) 7.5-325 MG tablet; Take 1 tablet by mouth every 4 (four) hours as needed for moderate pain.  Dispense: 30 tablet; Refill: 0  3. Essential hypertension Stable. Continue bp medication as prescribed   4. Close exposure to COVID-19 virus Negative results after last visit.   5. Urinary tract infection without hematuria, site unspecified Symptoms resolved.    General Counseling: Emmilia verbalizes understanding of the findings of today's phone visit and agrees with plan of treatment. I have discussed any further diagnostic evaluation that may be needed or ordered today. We also reviewed her medications today. she has been encouraged to call the office with any questions or concerns that should arise related to todays visit.  Reviewed risks and possible side effects associated with taking opiates, benzodiazepines and other CNS depressants. Combination of these could cause dizziness and drowsiness. Advised patient not to drive or operate machinery when taking these medications, as patient's and other's life can  be at risk and will have consequences. Patient verbalized understanding in this matter. Dependence and abuse for these drugs will be monitored closely. A Controlled substance policy and procedure is on file which allows [facility] to order a urine drug screen test at any visit. Patient understands and agrees with the plan  This patient was seen by Leretha Pol FNP Collaboration with  Dr Lavera Guise as a part of collaborative care agreement  Meds ordered this encounter  Medications  . HYDROcodone-acetaminophen (NORCO) 7.5-325 MG tablet    Sig: Take 1 tablet by mouth every 4 (four) hours as needed for moderate pain.    Dispense:  30 tablet    Refill:  0    Order Specific Question:   Supervising Provider    Answer:   Lavera Guise [id number]    Time spent: 68 Minutes    Dr Lavera Guise Internal medicine

## 2019-02-17 NOTE — Progress Notes (Signed)
Hey. Can you let her know that her COVID test was negative. Thanks.

## 2019-02-17 NOTE — Telephone Encounter (Signed)
-----   Message from Ronnell Freshwater, NP sent at 02/17/2019  8:28 AM EDT ----- Hey. Can you let her know that her COVID test was negative. Thanks.

## 2019-02-20 ENCOUNTER — Other Ambulatory Visit: Payer: Self-pay

## 2019-02-20 DIAGNOSIS — J301 Allergic rhinitis due to pollen: Secondary | ICD-10-CM

## 2019-02-20 MED ORDER — MONTELUKAST SODIUM 10 MG PO TABS: 10.0000 mg | ORAL_TABLET | Freq: Every day | ORAL | 3 refills | Status: DC

## 2019-02-27 DIAGNOSIS — N390 Urinary tract infection, site not specified: Secondary | ICD-10-CM | POA: Insufficient documentation

## 2019-03-17 ENCOUNTER — Other Ambulatory Visit: Payer: Self-pay

## 2019-03-17 DIAGNOSIS — E782 Mixed hyperlipidemia: Secondary | ICD-10-CM

## 2019-03-17 DIAGNOSIS — J301 Allergic rhinitis due to pollen: Secondary | ICD-10-CM

## 2019-03-17 MED ORDER — PANTOPRAZOLE SODIUM 40 MG PO TBEC: DELAYED_RELEASE_TABLET | ORAL | 3 refills | Status: DC

## 2019-03-17 MED ORDER — ROSUVASTATIN CALCIUM 10 MG PO TABS: 10.0000 mg | ORAL_TABLET | Freq: Every day | ORAL | 1 refills | Status: DC

## 2019-03-17 MED ORDER — MONTELUKAST SODIUM 10 MG PO TABS: 10.0000 mg | ORAL_TABLET | Freq: Every day | ORAL | 3 refills | Status: DC

## 2019-04-21 ENCOUNTER — Telehealth: Payer: Self-pay

## 2019-04-21 NOTE — Telephone Encounter (Signed)
Confirmed appointment with patient. klh °

## 2019-04-21 NOTE — Telephone Encounter (Signed)
Called lmom informing patient of appointment. klh 

## 2019-04-25 ENCOUNTER — Encounter: Payer: Self-pay | Admitting: Internal Medicine

## 2019-04-25 ENCOUNTER — Ambulatory Visit (INDEPENDENT_AMBULATORY_CARE_PROVIDER_SITE_OTHER): Payer: BC Managed Care – PPO | Admitting: Internal Medicine

## 2019-04-25 ENCOUNTER — Other Ambulatory Visit: Payer: Self-pay

## 2019-04-25 ENCOUNTER — Other Ambulatory Visit: Payer: Self-pay | Admitting: Adult Health

## 2019-04-25 VITALS — BP 130/70 | HR 78 | Temp 97.9°F | Resp 16 | Ht 64.0 in | Wt 208.2 lb

## 2019-04-25 DIAGNOSIS — R042 Hemoptysis: Secondary | ICD-10-CM

## 2019-04-25 DIAGNOSIS — I10 Essential (primary) hypertension: Secondary | ICD-10-CM | POA: Diagnosis not present

## 2019-04-25 MED ORDER — NITROFURANTOIN MONOHYD MACRO 100 MG PO CAPS: 100.0000 mg | ORAL_CAPSULE | Freq: Two times a day (BID) | ORAL | 0 refills | Status: AC

## 2019-04-25 MED ORDER — PHENAZOPYRIDINE HCL 200 MG PO TABS: 200.0000 mg | ORAL_TABLET | Freq: Three times a day (TID) | ORAL | 0 refills | Status: DC | PRN

## 2019-04-25 NOTE — Progress Notes (Signed)
[facility] Wendy Hubbard, [place] [address]  Pulmonary Sleep Medicine   Office Visit Note  Patient Name: Wendy Hubbard DOB: 1955/06/25 MRN [id number]  Date of Service: 04/25/2019  Complaints/HPI: Pt is here for pulmonary follow up.  Pt reports overall she is doing well.  She denies any breathing issues. She has not had any episode of hemoptysis since June 2020.  She denies any current need, and feels as though she is at baseline.       ROS  General: (-) fever, (-) chills, (-) night sweats, (-) weakness Skin: (-) rashes, (-) itching,. Eyes: (-) visual changes, (-) redness, (-) itching. Nose and Sinuses: (-) nasal stuffiness or itchiness, (-) postnasal drip, (-) nosebleeds, (-) sinus trouble. Mouth and Throat: (-) sore throat, (-) hoarseness. Neck: (-) swollen glands, (-) enlarged thyroid, (-) neck pain. Respiratory: - cough, (-) bloody sputum, - shortness of breath, - wheezing. Cardiovascular: - ankle swelling, (-) chest pain. Lymphatic: (-) lymph node enlargement. Neurologic: (-) numbness, (-) tingling. Psychiatric: (-) anxiety, (-) depression   Current Medication: Outpatient Encounter Medications as of 04/25/2019  Medication Sig Note  . ALPRAZolam (XANAX) 0.5 MG tablet Take 1 tablet (0.5 mg total) by mouth at bedtime as needed for anxiety.   . butalbital-acetaminophen-caffeine (FIORICET, ESGIC) 50-325-40 MG tablet Take 1-2 tablets by mouth every 6 (six) hours as needed for headache.   . cetirizine (ZYRTEC) 10 MG tablet Take 10 mg by mouth daily.   Marland Kitchen dicyclomine (BENTYL) 10 MG capsule Take 1 capsule (10 mg total) by mouth 4 (four) times daily -  before meals and at bedtime.   . diphenoxylate-atropine (LOMOTIL) 2.5-0.025 MG tablet Take 1 tablet by mouth 4 (four) times daily as needed for diarrhea or loose stools.   Marland Kitchen escitalopram (LEXAPRO) 10 MG tablet Take 1 tablet (10 mg total) by mouth every evening.   . gabapentin (NEURONTIN) 300 MG capsule Take 2  capsule po BID. Then take 3 capsules po QHS.   Marland Kitchen HYDROcodone-acetaminophen (NORCO) 7.5-325 MG tablet Take 1 tablet by mouth every 4 (four) hours as needed for moderate pain.   . metoprolol succinate (TOPROL-XL) 50 MG 24 hr tablet Take 1 tablet (50 mg total) by mouth at bedtime. Take with or immediately following a meal.   . montelukast (SINGULAIR) 10 MG tablet Take 1 tablet (10 mg total) by mouth at bedtime.   . pantoprazole (PROTONIX) 40 MG tablet Take one tablet by mouth daily.   . phenazopyridine (PYRIDIUM) 200 MG tablet Take 1 tablet (200 mg total) by mouth 3 (three) times daily as needed for pain.   . rosuvastatin (CRESTOR) 10 MG tablet Take 1 tablet (10 mg total) by mouth daily.   Marland Kitchen spironolactone (ALDACTONE) 50 MG tablet Take 50 mg by mouth daily. 08/22/2014: .   . sulfamethoxazole-trimethoprim (BACTRIM DS) 800-160 MG tablet Take 1 tablet by mouth 2 (two) times daily.   Marland Kitchen tiZANidine (ZANAFLEX) 4 MG tablet TAKE 1 TABLET BY MOUTH EVERYDAY AT BEDTIME    No facility-administered encounter medications on file as of 04/25/2019.    Surgical History: Past Surgical History:  Procedure Laterality Date  . ABDOMINAL HYSTERECTOMY    . ANKLE ARTHROSCOPY Bilateral   . APPENDECTOMY    . BLADDER SUSPENSION    . BREAST EXCISIONAL BIOPSY Left 1990   neg  . BREAST EXCISIONAL BIOPSY Right 1989   neg  . BREAST SURGERY    . CHOLECYSTECTOMY    . COLONOSCOPY N/A 11/04/2015   Procedure:  COLONOSCOPY;  Surgeon: Manya Silvas, MD;  Location: [facility];  Service: Endoscopy;  Laterality: N/A;  . ESOPHAGOGASTRODUODENOSCOPY N/A 11/02/2015   Procedure: ESOPHAGOGASTRODUODENOSCOPY (EGD);  Surgeon: Manya Silvas, MD;  Location: [facility] ENDOSCOPY;  Service: Endoscopy;  Laterality: N/A;  . KNEE ARTHROSCOPY Left   . REDUCTION MAMMAPLASTY Bilateral 1988  . TONSILLECTOMY      Medical History: Past Medical History:  Diagnosis Date  . COPD (chronic obstructive pulmonary disease) ([employer])   . Diabetes mellitus  without complication ([place])   . GERD (gastroesophageal reflux disease)   . Hyperlipidemia   . Hypertension   . Rapid heart rate   . Sleep apnea     Family History: Family History  Problem Relation Age of Onset  . Hypertension Other   . Bladder Cancer Mother   . Kidney cancer Neg Hx   . Prostate cancer Neg Hx   . Breast cancer Neg Hx     Social History: Social History   Socioeconomic History  . Marital status: Married    Spouse name: Not on file  . Number of children: Not on file  . Years of education: Not on file  . Highest education level: Not on file  Occupational History  . Not on file  Tobacco Use  . Smoking status: Former Research scientist (life sciences)  . Smokeless tobacco: Never Used  . Tobacco comment: quit in 1996  Substance and Sexual Activity  . Alcohol use: Yes    Alcohol/week: 0.0 standard drinks    Comment: occasional  . Drug use: No  . Sexual activity: Not on file  Other Topics Concern  . Not on file  Social History Narrative  . Not on file   Social Determinants of Health   Financial Resource Strain:   . Difficulty of Paying Living Expenses: Not on file  Food Insecurity:   . Worried About Charity fundraiser in the Last Year: Not on file  . Ran Out of Food in the Last Year: Not on file  Transportation Needs:   . Lack of Transportation (Medical): Not on file  . Lack of Transportation (Non-Medical): Not on file  Physical Activity:   . Days of Exercise per Week: Not on file  . Minutes of Exercise per Session: Not on file  Stress:   . Feeling of Stress : Not on file  Social Connections:   . Frequency of Communication with Friends and Family: Not on file  . Frequency of Social Gatherings with Friends and Family: Not on file  . Attends Religious Services: Not on file  . Active Member of Clubs or Organizations: Not on file  . Attends Archivist Meetings: Not on file  . Marital Status: Not on file  Intimate Partner Violence:   . Fear of Current or Ex-Partner:  Not on file  . Emotionally Abused: Not on file  . Physically Abused: Not on file  . Sexually Abused: Not on file    Vital Signs: Blood pressure 130/70, pulse 78, temperature 97.9 F (36.6 C), resp. rate 16, height 5\' 4"  (1.626 m), weight 208 lb 3.2 oz (94.4 kg), SpO2 98 %.  Examination: General Appearance: The patient is well-developed, well-nourished, and in no distress. Skin: Gross inspection of skin unremarkable. Head: normocephalic, no gross deformities. Eyes: no gross deformities noted. ENT: ears appear grossly normal no exudates. Neck: Supple. No thyromegaly. No LAD. Respiratory: clear bilaterally. Cardiovascular: Normal S1 and S2 without murmur or rub. Extremities: No cyanosis. pulses are equal. Neurologic: Alert and  oriented. No involuntary movements.  LABS: Recent Results (from the past 2160 hour(s))  Novel Coronavirus, NAA ([facility])     Status: None   Collection Time: 02/14/19 10:36 AM   Specimen: Oropharyngeal(OP) collection in vial transport medium   OROPHARYNGEA  TESTING  Result Value Ref Range   SARS-CoV-2, NAA Not Detected Not Detected    Comment: Testing was performed using the cobas(R) SARS-CoV-2 test. This nucleic acid amplification test was developed and its performance characteristics determined by [employer]. Nucleic acid amplification tests include PCR and TMA. This test has not been FDA cleared or approved. This test has been authorized by FDA under an Emergency Use Authorization (EUA). This test is only authorized for the duration of time the declaration that circumstances exist justifying the authorization of the emergency use of in vitro diagnostic tests for detection of SARS-CoV-2 virus and/or diagnosis of COVID-19 infection under section 564(b)(1) of the Act, 21 U.S.C. [id number]) (1), unless the authorization is terminated or revoked sooner. When diagnostic testing is negative, the possibility of a false negative result should be  considered in the context of a patient's recent exposures and the presence of clinical signs and symptoms consistent with COVID-19. An individual without symptoms  of COVID-19 and who is not shedding SARS-CoV-2 virus would expect to have a negative (not detected) result in this assay.     Radiology: CT Chest W Contrast  Result Date: 11/03/2018 CLINICAL DATA:  Recurrent hemoptysis. EXAM: CT CHEST WITH CONTRAST TECHNIQUE: Multidetector CT imaging of the chest was performed during intravenous contrast administration. CONTRAST:  19mL OMNIPAQUE IOHEXOL 300 MG/ML  SOLN COMPARISON:  10/15/2017 chest CT.  07/14/2018 chest radiograph. FINDINGS: Cardiovascular: Normal heart size. No significant pericardial effusion/thickening. Mildly atherosclerotic nonaneurysmal thoracic aorta. Normal caliber pulmonary arteries. No central pulmonary emboli. Mediastinum/Nodes: No discrete thyroid nodules. Unremarkable esophagus. No pathologically enlarged axillary, mediastinal or hilar lymph nodes. Lungs/Pleura: No pneumothorax. No pleural effusion. No acute consolidative airspace disease, lung masses or significant pulmonary nodules. Stable tiny calcified peripheral right upper lobe granuloma. No significant regions of bronchiectasis. No central airway stenosis. 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 Subcentimeter hypodense renal cortical lesions in the anterior upper left kidney are too small to characterize and require no follow-up. Musculoskeletal: No aggressive appearing focal osseous lesions. Moderate thoracic spondylosis. IMPRESSION: No active disease in the chest. No CT findings to explain the hemoptysis. Aortic Atherosclerosis (ICD10-I70.0). Electronically Signed   By: Ilona Sorrel M.D.   On: 11/03/2018 16:39    No results found.  No results found.    Assessment and Plan: Patient Active Problem List   Diagnosis Date Noted  . Urinary tract infection without hematuria  02/27/2019  . Close exposure to COVID-19 virus 02/13/2019  . Generalized anxiety disorder 07/27/2018  . Uncontrolled type 2 diabetes mellitus with hyperglycemia ([place]) 07/27/2018  . Vitamin D deficiency 07/27/2018  . Acute upper respiratory infection 02/28/2018  . Cough 02/28/2018  . Sore throat 02/28/2018  . Seasonal allergic rhinitis due to pollen 02/28/2018  . Migraine without aura and without status migrainosus, not intractable 12/21/2017  . Hypertension 12/21/2017  . Type 2 diabetes mellitus with peripheral neuropathy ([place]) 08/21/2017  . Pain in both feet 08/21/2017  . Neuralgia and neuritis, unspecified 08/21/2017  . Urinary tract infection with hematuria 08/21/2017  . Dysuria 08/21/2017  . Screening for breast cancer 08/21/2017  . Status post bariatric surgery 01/05/2017  . Benign essential HTN 12/05/2015  . Mixed hyperlipidemia 12/05/2015  . Shortness of  breath 12/05/2015  . Primary osteoarthritis of left knee 10/04/2014  . Left knee pain 09/06/2014    1. Hemoptysis Appears to have resolved, continue to monitor.  2. Essential hypertension BP stable, continue current therapy.   General Counseling: I have discussed the findings of the evaluation and examination with [facility].  I have also discussed any further diagnostic evaluation thatmay be needed or ordered today. Wendy Hubbard verbalizes understanding of the findings of todays visit. We also reviewed her medications today and discussed drug interactions and side effects including but not limited excessive drowsiness and altered mental states. We also discussed that there is always a risk not just to her but also people around her. she has been encouraged to call the office with any questions or concerns that should arise related to todays visit.  No orders of the defined types were placed in this encounter.    Time spent: 15 This patient was seen by Orson Gear AGNP-C in Collaboration with Dr. Devona Konig as a part of  collaborative care agreement.    I have personally obtained a history, examined the patient, evaluated laboratory and imaging results, formulated the assessment and plan and placed orders.    Allyne Gee, MD [facility] Sleep medicine

## 2019-04-25 NOTE — Progress Notes (Unsigned)
Pt having UTI symptoms. Discussed issues with multiple UTI's, if patient has another infection or this does not resolve, she should see urology.

## 2019-05-02 ENCOUNTER — Other Ambulatory Visit: Payer: Self-pay

## 2019-05-02 DIAGNOSIS — M792 Neuralgia and neuritis, unspecified: Secondary | ICD-10-CM

## 2019-05-23 ENCOUNTER — Telehealth: Payer: Self-pay

## 2019-05-23 NOTE — Telephone Encounter (Signed)
LMOM FOR PATIENT TO CONFIRM AS VIRTUAL.

## 2019-05-24 DIAGNOSIS — H04203 Unspecified epiphora, bilateral lacrimal glands: Secondary | ICD-10-CM | POA: Diagnosis not present

## 2019-05-25 ENCOUNTER — Other Ambulatory Visit: Payer: Self-pay

## 2019-05-25 ENCOUNTER — Encounter: Payer: Self-pay | Admitting: Nurse Practitioner

## 2019-05-25 ENCOUNTER — Ambulatory Visit: Payer: BC Managed Care – PPO | Admitting: Nurse Practitioner

## 2019-05-25 VITALS — Ht 64.0 in

## 2019-05-25 DIAGNOSIS — M792 Neuralgia and neuritis, unspecified: Secondary | ICD-10-CM | POA: Diagnosis not present

## 2019-05-25 DIAGNOSIS — J300 Vasomotor rhinitis: Secondary | ICD-10-CM | POA: Insufficient documentation

## 2019-05-25 DIAGNOSIS — I10 Essential (primary) hypertension: Secondary | ICD-10-CM | POA: Diagnosis not present

## 2019-05-25 MED ORDER — HYDROCODONE-ACETAMINOPHEN 7.5-325 MG PO TABS: 1.0000 | ORAL_TABLET | ORAL | 0 refills | Status: DC | PRN

## 2019-05-25 MED ORDER — FLUTICASONE PROPIONATE 50 MCG/ACT NA SUSP: 2.0000 | Freq: Every day | NASAL | 6 refills | Status: DC

## 2019-05-25 NOTE — Progress Notes (Signed)
[facility] [place], [address] [address]  Internal MEDICINE  Telephone Visit  Patient Name: Wendy Hubbard  [id number]  [id number]  Date of Service: 05/25/2019  I connected with the patient at 1:51pm by wecam and verified the patients identity using two identifiers.   I discussed the limitations, risks, security and privacy concerns of performing an evaluation and management service by webcam and the availability of in person appointments. I also discussed with the patient that there may be a patient responsible charge related to the service.  The patient expressed understanding and agrees to proceed.    Chief Complaint  Patient presents with  . Telephone Assessment  . Telephone Screen  . Diabetes  . Medication Refill    HYDROCODONE     The patient has been contacted via webcam for follow up visit due to concerns for spread of novel coronavirus. The patient presents for routine follow up visit. 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 Blood sugars remain very well controlled without medication. Her feet feel like needles and hot pins going throgh them. At night, they are so painful, they hurt to have even a sheet touch the feet. She did see a neurologist once and neuropathy diagnsosis was confirmed. She does take gabapentin for her feet and at night, she will take hydrocodone/APAP 7.5/325mg  at bedtime which helps sometimes. She does need to have a refill for this today.   The patient is complaining of persistent post nasal drip and scratchy throat and moderate fatigue. Symptoms are similar to what she feels when allergies are acting up. She currently takes singulair every morning and takes zyrtec every evening. Has been on zyrtec at night for years.         Current Medication: Outpatient Encounter Medications as of 05/25/2019  Medication Sig Note  . ALPRAZolam (XANAX) 0.5 MG tablet Take 1 tablet  (0.5 mg total) by mouth at bedtime as needed for anxiety.   . butalbital-acetaminophen-caffeine (FIORICET, ESGIC) 50-325-40 MG tablet Take 1-2 tablets by mouth every 6 (six) hours as needed for headache.   . cetirizine (ZYRTEC) 10 MG tablet Take 10 mg by mouth daily.   Marland Kitchen dicyclomine (BENTYL) 10 MG capsule Take 1 capsule (10 mg total) by mouth 4 (four) times daily -  before meals and at bedtime.   . diphenoxylate-atropine (LOMOTIL) 2.5-0.025 MG tablet Take 1 tablet by mouth 4 (four) times daily as needed for diarrhea or loose stools.   Marland Kitchen escitalopram (LEXAPRO) 10 MG tablet Take 1 tablet (10 mg total) by mouth every evening.   . gabapentin (NEURONTIN) 300 MG capsule Take 2 capsule po BID. Then take 3 capsules po QHS.   Marland Kitchen metoprolol succinate (TOPROL-XL) 50 MG 24 hr tablet Take 1 tablet (50 mg total) by mouth at bedtime. Take with or immediately following a meal.   . montelukast (SINGULAIR) 10 MG tablet Take 1 tablet (10 mg total) by mouth at bedtime.   . pantoprazole (PROTONIX) 40 MG tablet Take one tablet by mouth daily.   . phenazopyridine (PYRIDIUM) 200 MG tablet Take 1 tablet (200 mg total) by mouth 3 (three) times daily as needed for pain.   . phenazopyridine (PYRIDIUM) 200 MG tablet Take 1 tablet (200 mg total) by mouth 3 (three) times daily as needed for pain.   . rosuvastatin (CRESTOR) 10 MG tablet Take 1 tablet (10 mg total) by mouth daily.   Marland Kitchen spironolactone (ALDACTONE)  50 MG tablet Take 50 mg by mouth daily. 08/22/2014: .   Marland Kitchen tiZANidine (ZANAFLEX) 4 MG tablet TAKE 1 TABLET BY MOUTH EVERYDAY AT BEDTIME   . [DISCONTINUED] HYDROcodone-acetaminophen (NORCO) 7.5-325 MG tablet Take 1 tablet by mouth every 4 (four) hours as needed for moderate pain.   . [DISCONTINUED] HYDROcodone-acetaminophen (NORCO) 7.5-325 MG tablet Take 1 tablet by mouth every 4 (four) hours as needed for moderate pain.   . fluticasone (FLONASE) 50 MCG/ACT nasal spray Place 2 sprays into both nostrils daily.   Marland Kitchen  HYDROcodone-acetaminophen (NORCO) 7.5-325 MG tablet Take 1 tablet by mouth every 4 (four) hours as needed for moderate pain.   . [DISCONTINUED] sulfamethoxazole-trimethoprim (BACTRIM DS) 800-160 MG tablet Take 1 tablet by mouth 2 (two) times daily. (Patient not taking: Reported on 05/25/2019)    No facility-administered encounter medications on file as of 05/25/2019.    Surgical History: Past Surgical History:  Procedure Laterality Date  . ABDOMINAL HYSTERECTOMY    . ANKLE ARTHROSCOPY Bilateral   . APPENDECTOMY    . BLADDER SUSPENSION    . BREAST EXCISIONAL BIOPSY Left 1990   neg  . BREAST EXCISIONAL BIOPSY Right 1989   neg  . BREAST SURGERY    . CHOLECYSTECTOMY    . COLONOSCOPY N/A 11/04/2015   Procedure: COLONOSCOPY;  Surgeon: Manya Silvas, MD;  Location: [facility] ENDOSCOPY;  Service: Endoscopy;  Laterality: N/A;  . ESOPHAGOGASTRODUODENOSCOPY N/A 11/02/2015   Procedure: ESOPHAGOGASTRODUODENOSCOPY (EGD);  Surgeon: Manya Silvas, MD;  Location: [facility] ENDOSCOPY;  Service: Endoscopy;  Laterality: N/A;  . KNEE ARTHROSCOPY Left   . REDUCTION MAMMAPLASTY Bilateral 1988  . TONSILLECTOMY      Medical History: Past Medical History:  Diagnosis Date  . COPD (chronic obstructive pulmonary disease) ([place])   . Diabetes mellitus without complication ([place])   . GERD (gastroesophageal reflux disease)   . Hyperlipidemia   . Hypertension   . Rapid heart rate   . Sleep apnea     Family History: Family History  Problem Relation Age of Onset  . Hypertension Other   . Bladder Cancer Mother   . Kidney cancer Neg Hx   . Prostate cancer Neg Hx   . Breast cancer Neg Hx     Social History   Socioeconomic History  . Marital status: Married    Spouse name: Not on file  . Number of children: Not on file  . Years of education: Not on file  . Highest education level: Not on file  Occupational History  . Not on file  Tobacco Use  . Smoking status: Former Research scientist (life sciences)  . Smokeless tobacco: Never  Used  . Tobacco comment: quit in 1996  Substance and Sexual Activity  . Alcohol use: Yes    Alcohol/week: 0.0 standard drinks    Comment: occasional  . Drug use: No  . Sexual activity: Not on file  Other Topics Concern  . Not on file  Social History Narrative  . Not on file   Social Determinants of Health   Financial Resource Strain:   . Difficulty of Paying Living Expenses: Not on file  Food Insecurity:   . Worried About Charity fundraiser in the Last Year: Not on file  . Ran Out of Food in the Last Year: Not on file  Transportation Needs:   . Lack of Transportation (Medical): Not on file  . Lack of Transportation (Non-Medical): Not on file  Physical Activity:   . Days of Exercise per Week: Not on  file  . Minutes of Exercise per Session: Not on file  Stress:   . Feeling of Stress : Not on file  Social Connections:   . Frequency of Communication with Friends and Family: Not on file  . Frequency of Social Gatherings with Friends and Family: Not on file  . Attends Religious Services: Not on file  . Active Member of Clubs or Organizations: Not on file  . Attends Archivist Meetings: Not on file  . Marital Status: Not on file  Intimate Partner Violence:   . Fear of Current or Ex-Partner: Not on file  . Emotionally Abused: Not on file  . Physically Abused: Not on file  . Sexually Abused: Not on file      Review of Systems  Constitutional: Positive for fatigue. Negative for chills and unexpected weight change.  HENT: Positive for congestion and postnasal drip. Negative for rhinorrhea, sneezing and sore throat.        Scratchy throat.  Respiratory: Negative for cough, chest tightness, shortness of breath and wheezing.   Cardiovascular: Negative for chest pain and palpitations.  Gastrointestinal: Negative for abdominal pain, constipation, diarrhea, nausea and vomiting.  Endocrine: Negative for cold intolerance, heat intolerance, polydipsia and polyuria.        Continues to have good control of blood sugars without use of diabetic medication.   Musculoskeletal: Positive for arthralgias and myalgias. Negative for back pain, joint swelling and neck pain.        Neuropathy in both feet .feels like burning sensation. Worse at night, especially when sheets or other material is touching her feet .   Skin: Negative for rash.  Allergic/Immunologic: Negative for environmental allergies.  Neurological: Positive for numbness. Negative for tremors.       Numbness with significant neuropathy in both feet.   Hematological: Negative for adenopathy. Does not bruise/bleed easily.  Psychiatric/Behavioral: Negative for behavioral problems (Depression), sleep disturbance and suicidal ideas. The patient is not nervous/anxious.     Today's Vitals   05/25/19 1343  Height: 5\' 4"  (1.626 m)   Body mass index is 35.74 kg/m.  Observation/Objective:   The patient is alert and oriented. She is pleasant and answers all questions appropriately. Breathing is non-labored. She is in no acute distress at this time.    Assessment/Plan:  1. Essential hypertension Stable. Continue bp medication as prescribed   2. Neuralgia and neuritis, unspecified Continue gabapentin as prescribed. May take hydrocodone/APAP 7.5/325mg  tablets at bedtime if needed. Two 30 day prescriptions were sent to her pharmacy. Dates are 05/25/2019 and 06/23/2019.  - HYDROcodone-acetaminophen (NORCO) 7.5-325 MG tablet; Take 1 tablet by mouth every 4 (four) hours as needed for moderate pain.  Dispense: 30 tablet; Refill: 0  3. Vasomotor rhinitis Add flonase daily. Change zyrtec to allegra daily. Continue singulair everyday - fluticasone (FLONASE) 50 MCG/ACT nasal spray; Place 2 sprays into both nostrils daily.  Dispense: 16 g; Refill: 6  General Counseling: Tannie verbalizes understanding of the findings of today's phone visit and agrees with plan of treatment. I have discussed any further diagnostic  evaluation that may be needed or ordered today. We also reviewed her medications today. she has been encouraged to call the office with any questions or concerns that should arise related to todays visit.   Reviewed risks and possible side effects associated with taking opiates, benzodiazepines and other CNS depressants. Combination of these could cause dizziness and drowsiness. Advised patient not to drive or operate machinery when taking these medications, as patient's  and other's life can be at risk and will have consequences. Patient verbalized understanding in this matter. Dependence and abuse for these drugs will be monitored closely. A Controlled substance policy and procedure is on file which allows [facility] to order a urine drug screen test at any visit. Patient understands and agrees with the plan  This patient was seen by Leretha Pol FNP Collaboration with Dr Lavera Guise as a part of collaborative care agreement  Meds ordered this encounter  Medications  . fluticasone (FLONASE) 50 MCG/ACT nasal spray    Sig: Place 2 sprays into both nostrils daily.    Dispense:  16 g    Refill:  6    Order Specific Question:   Supervising Provider    Answer:   Lavera Guise [id number]  . DISCONTD: HYDROcodone-acetaminophen (NORCO) 7.5-325 MG tablet    Sig: Take 1 tablet by mouth every 4 (four) hours as needed for moderate pain.    Dispense:  30 tablet    Refill:  0    Order Specific Question:   Supervising Provider    Answer:   Lavera Guise [id number]  . HYDROcodone-acetaminophen (NORCO) 7.5-325 MG tablet    Sig: Take 1 tablet by mouth every 4 (four) hours as needed for moderate pain.    Dispense:  30 tablet    Refill:  0    Ok to fill after 06/23/2019    Order Specific Question:   Supervising Provider    Answer:   Lavera Guise [id number]    Time spent: 22 Minutes    Dr Lavera Guise Internal medicine

## 2019-05-26 ENCOUNTER — Telehealth: Payer: Self-pay

## 2019-05-26 NOTE — Telephone Encounter (Signed)
PT WAS INFORMED.

## 2019-05-26 NOTE — Telephone Encounter (Signed)
If she has no frequency, urgency, or pain with urination, I suggest she increase water intake and trial of OTC AZO for bladder health to see if this helps. If other symptoms develop, she should let us know.

## 2019-06-23 ENCOUNTER — Other Ambulatory Visit: Payer: Self-pay

## 2019-06-23 MED ORDER — METOPROLOL SUCCINATE ER 50 MG PO TB24: 50.0000 mg | ORAL_TABLET | Freq: Every day | ORAL | 1 refills | Status: DC

## 2019-06-28 ENCOUNTER — Other Ambulatory Visit: Payer: Self-pay

## 2019-06-30 ENCOUNTER — Telehealth: Payer: Self-pay

## 2019-06-30 NOTE — Telephone Encounter (Signed)
PRIOR AUTHORIZATION FOR PANTOPRAZOLE HAS BEEN APPROVED.TAT

## 2019-07-03 ENCOUNTER — Other Ambulatory Visit: Payer: Self-pay

## 2019-07-03 MED ORDER — ESCITALOPRAM OXALATE 10 MG PO TABS: 10.0000 mg | ORAL_TABLET | Freq: Every evening | ORAL | 1 refills | Status: DC

## 2019-07-12 ENCOUNTER — Other Ambulatory Visit: Payer: Self-pay

## 2019-07-12 ENCOUNTER — Ambulatory Visit: Payer: Self-pay | Attending: Internal Medicine

## 2019-07-12 ENCOUNTER — Encounter: Payer: Self-pay | Admitting: Adult Health

## 2019-07-12 ENCOUNTER — Ambulatory Visit: Payer: BC Managed Care – PPO | Admitting: Adult Health

## 2019-07-12 VITALS — Temp 97.2°F | Resp 16 | Ht 64.0 in | Wt 201.4 lb

## 2019-07-12 DIAGNOSIS — Z9189 Other specified personal risk factors, not elsewhere classified: Secondary | ICD-10-CM | POA: Diagnosis not present

## 2019-07-12 DIAGNOSIS — R05 Cough: Secondary | ICD-10-CM | POA: Diagnosis not present

## 2019-07-12 DIAGNOSIS — J069 Acute upper respiratory infection, unspecified: Secondary | ICD-10-CM | POA: Diagnosis not present

## 2019-07-12 DIAGNOSIS — Z20822 Contact with and (suspected) exposure to covid-19: Secondary | ICD-10-CM

## 2019-07-12 DIAGNOSIS — R059 Cough, unspecified: Secondary | ICD-10-CM

## 2019-07-12 MED ORDER — HYDROCOD POLST-CPM POLST ER 10-8 MG/5ML PO SUER: 5.0000 mL | Freq: Two times a day (BID) | ORAL | 0 refills | Status: DC | PRN

## 2019-07-12 MED ORDER — SULFAMETHOXAZOLE-TRIMETHOPRIM 400-80 MG PO TABS: 1.0000 | ORAL_TABLET | Freq: Two times a day (BID) | ORAL | 0 refills | Status: DC

## 2019-07-12 NOTE — Progress Notes (Signed)
[facility] [place], [place] [address]  Internal MEDICINE  Telephone Visit  Patient Name: Wendy Hubbard  [id number]  [id number]  Date of Service: 07/12/2019  I connected with the patient at 1014 by telephone and verified the patients identity using two identifiers.   I discussed the limitations, risks, security and privacy concerns of performing an evaluation and management service by telephone and the availability of in person appointments. I also discussed with the patient that there may be a patient responsible charge related to the service.  The patient expressed understanding and agrees to proceed.    Chief Complaint  Patient presents with  . Telephone Screen  . Telephone Assessment  . Diabetes  . Gastroesophageal Reflux  . Hyperlipidemia  . Hypertension  . Sore Throat    very bad sore throat  . Cough    HPI  Pt seen via telephone.  She is complaining of severe sore throat for the last 2 days. She reports her throat has been mildly sore for the last few months.  She denies fever.  She feels like there is something in her throat when she wakes up.  Also she has a mild headache intermittently.  She has some dry cough also. She reports recently being around a lot of family, due to a funeral.  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      Current Medication: Outpatient Encounter Medications as of 07/12/2019  Medication Sig Note  . ALPRAZolam (XANAX) 0.5 MG tablet Take 1 tablet (0.5 mg total) by mouth at bedtime as needed for anxiety.   . butalbital-acetaminophen-caffeine (FIORICET, ESGIC) 50-325-40 MG tablet Take 1-2 tablets by mouth every 6 (six) hours as needed for headache.   . cetirizine (ZYRTEC) 10 MG tablet Take 10 mg by mouth daily.   Marland Kitchen dicyclomine (BENTYL) 10 MG capsule Take 1 capsule (10 mg total) by mouth 4 (four) times daily -  before meals and at bedtime.   . diphenoxylate-atropine (LOMOTIL)  2.5-0.025 MG tablet Take 1 tablet by mouth 4 (four) times daily as needed for diarrhea or loose stools.   Marland Kitchen escitalopram (LEXAPRO) 10 MG tablet Take 1 tablet (10 mg total) by mouth every evening.   . fluticasone (FLONASE) 50 MCG/ACT nasal spray Place 2 sprays into both nostrils daily.   Marland Kitchen gabapentin (NEURONTIN) 300 MG capsule Take 2 capsule po BID. Then take 3 capsules po QHS.   Marland Kitchen HYDROcodone-acetaminophen (NORCO) 7.5-325 MG tablet Take 1 tablet by mouth every 4 (four) hours as needed for moderate pain.   . metoprolol succinate (TOPROL-XL) 50 MG 24 hr tablet Take 1 tablet (50 mg total) by mouth at bedtime. Take with or immediately following a meal.   . montelukast (SINGULAIR) 10 MG tablet Take 1 tablet (10 mg total) by mouth at bedtime.   . pantoprazole (PROTONIX) 40 MG tablet Take one tablet by mouth daily.   . phenazopyridine (PYRIDIUM) 200 MG tablet Take 1 tablet (200 mg total) by mouth 3 (three) times daily as needed for pain.   . phenazopyridine (PYRIDIUM) 200 MG tablet Take 1 tablet (200 mg total) by mouth 3 (three) times daily as needed for pain.   . rosuvastatin (CRESTOR) 10 MG tablet Take 1 tablet (10 mg total) by mouth daily.   Marland Kitchen spironolactone (ALDACTONE) 50 MG tablet Take 50 mg by mouth daily. 08/22/2014: .   Marland Kitchen tiZANidine (ZANAFLEX) 4 MG tablet TAKE 1 TABLET BY  MOUTH EVERYDAY AT BEDTIME   . chlorpheniramine-HYDROcodone (TUSSIONEX PENNKINETIC ER) 10-8 MG/5ML SUER Take 5 mLs by mouth every 12 (twelve) hours as needed for cough.   . sulfamethoxazole-trimethoprim (BACTRIM) 400-80 MG tablet Take 1 tablet by mouth 2 (two) times daily.    No facility-administered encounter medications on file as of 07/12/2019.    Surgical History: Past Surgical History:  Procedure Laterality Date  . ABDOMINAL HYSTERECTOMY    . ANKLE ARTHROSCOPY Bilateral   . APPENDECTOMY    . BLADDER SUSPENSION    . BREAST EXCISIONAL BIOPSY Left 1990   neg  . BREAST EXCISIONAL BIOPSY Right 1989   neg  . BREAST SURGERY     . CHOLECYSTECTOMY    . COLONOSCOPY N/A 11/04/2015   Procedure: COLONOSCOPY;  Surgeon: Manya Silvas, MD;  Location: [facility] ENDOSCOPY;  Service: Endoscopy;  Laterality: N/A;  . ESOPHAGOGASTRODUODENOSCOPY N/A 11/02/2015   Procedure: ESOPHAGOGASTRODUODENOSCOPY (EGD);  Surgeon: Manya Silvas, MD;  Location: [facility] ENDOSCOPY;  Service: Endoscopy;  Laterality: N/A;  . KNEE ARTHROSCOPY Left   . REDUCTION MAMMAPLASTY Bilateral 1988  . TONSILLECTOMY      Medical History: Past Medical History:  Diagnosis Date  . COPD (chronic obstructive pulmonary disease) ([place])   . Diabetes mellitus without complication ([place])   . GERD (gastroesophageal reflux disease)   . Hyperlipidemia   . Hypertension   . Rapid heart rate   . Sleep apnea     Family History: Family History  Problem Relation Age of Onset  . Hypertension Other   . Bladder Cancer Mother   . Kidney cancer Neg Hx   . Prostate cancer Neg Hx   . Breast cancer Neg Hx     Social History   Socioeconomic History  . Marital status: Married    Spouse name: Not on file  . Number of children: Not on file  . Years of education: Not on file  . Highest education level: Not on file  Occupational History  . Not on file  Tobacco Use  . Smoking status: Former Research scientist (life sciences)  . Smokeless tobacco: Never Used  . Tobacco comment: quit in 1996  Substance and Sexual Activity  . Alcohol use: Yes    Alcohol/week: 0.0 standard drinks    Comment: occasional  . Drug use: No  . Sexual activity: Not on file  Other Topics Concern  . Not on file  Social History Narrative  . Not on file   Social Determinants of Health   Financial Resource Strain:   . Difficulty of Paying Living Expenses: Not on file  Food Insecurity:   . Worried About Charity fundraiser in the Last Year: Not on file  . Ran Out of Food in the Last Year: Not on file  Transportation Needs:   . Lack of Transportation (Medical): Not on file  . Lack of Transportation (Non-Medical): Not on  file  Physical Activity:   . Days of Exercise per Week: Not on file  . Minutes of Exercise per Session: Not on file  Stress:   . Feeling of Stress : Not on file  Social Connections:   . Frequency of Communication with Friends and Family: Not on file  . Frequency of Social Gatherings with Friends and Family: Not on file  . Attends Religious Services: Not on file  . Active Member of Clubs or Organizations: Not on file  . Attends Archivist Meetings: Not on file  . Marital Status: Not on file  Intimate Partner Violence:   .  Fear of Current or Ex-Partner: Not on file  . Emotionally Abused: Not on file  . Physically Abused: Not on file  . Sexually Abused: Not on file      Review of Systems  Constitutional: Negative for chills, fatigue and unexpected weight change.  HENT: Negative for congestion, rhinorrhea, sneezing and sore throat.   Eyes: Negative for photophobia, pain and redness.  Respiratory: Negative for cough, chest tightness and shortness of breath.   Cardiovascular: Negative for chest pain and palpitations.  Gastrointestinal: Negative for abdominal pain, constipation, diarrhea, nausea and vomiting.  Endocrine: Negative.   Genitourinary: Negative for dysuria and frequency.  Musculoskeletal: Negative for arthralgias, back pain, joint swelling and neck pain.  Skin: Negative for rash.  Allergic/Immunologic: Negative.   Neurological: Negative for tremors and numbness.  Hematological: Negative for adenopathy. Does not bruise/bleed easily.  Psychiatric/Behavioral: Negative for behavioral problems and sleep disturbance. The patient is not nervous/anxious.     Vital Signs: Temp (!) 97.2 F (36.2 C)   Resp 16   Ht 5\' 4"  (1.626 m)   Wt 201 lb 6.4 oz (91.4 kg)   BMI 34.57 kg/m    Observation/Objective:  Ill sounding, NAD noted.   Assessment/Plan: 1. Upper respiratory tract infection, unspecified type Advised patient to take entire course of antibiotics as  prescribed with food. Pt should return to clinic in 7-10 days if symptoms fail to improve or new symptoms develop.  - sulfamethoxazole-trimethoprim (BACTRIM) 400-80 MG tablet; Take 1 tablet by mouth 2 (two) times daily.  Dispense: 20 tablet; Refill: 0  2. At increased risk of exposure to COVID-19 virus ecnouraged patient to get tested for covid 19 due to job, and recent travel.  3. Cough Reviewed risks and possible side effects associated with taking opiates, benzodiazepines and other CNS depressants. Combination of these could cause dizziness and drowsiness. Advised patient not to drive or operate machinery when taking these medications, as patient's and other's life can be at risk and will have consequences. Patient verbalized understanding in this matter. Dependence and abuse for these drugs will be monitored closely. A Controlled substance policy and procedure is on file which allows [facility] to order a urine drug screen test at any visit. Patient understands and agrees with the plan - chlorpheniramine-HYDROcodone (TUSSIONEX PENNKINETIC ER) 10-8 MG/5ML SUER; Take 5 mLs by mouth every 12 (twelve) hours as needed for cough.  Dispense: 70 mL; Refill: 0  General Counseling: Ressie verbalizes understanding of the findings of today's phone visit and agrees with plan of treatment. I have discussed any further diagnostic evaluation that may be needed or ordered today. We also reviewed her medications today. she has been encouraged to call the office with any questions or concerns that should arise related to todays visit.    No orders of the defined types were placed in this encounter.   Meds ordered this encounter  Medications  . sulfamethoxazole-trimethoprim (BACTRIM) 400-80 MG tablet    Sig: Take 1 tablet by mouth 2 (two) times daily.    Dispense:  20 tablet    Refill:  0  . chlorpheniramine-HYDROcodone (TUSSIONEX PENNKINETIC ER) 10-8 MG/5ML SUER    Sig: Take 5 mLs by mouth every  12 (twelve) hours as needed for cough.    Dispense:  70 mL    Refill:  0    Time spent: [place] AGNP-C Internal medicine

## 2019-07-13 LAB — NOVEL CORONAVIRUS, NAA: SARS-CoV-2, NAA: NOT DETECTED

## 2019-07-20 ENCOUNTER — Other Ambulatory Visit: Payer: Self-pay

## 2019-07-20 MED ORDER — TIZANIDINE HCL 4 MG PO TABS: ORAL_TABLET | ORAL | 3 refills | Status: DC

## 2019-07-25 ENCOUNTER — Other Ambulatory Visit: Payer: Self-pay

## 2019-07-25 DIAGNOSIS — F411 Generalized anxiety disorder: Secondary | ICD-10-CM

## 2019-07-26 MED ORDER — ALPRAZOLAM 0.5 MG PO TABS: 0.5000 mg | ORAL_TABLET | Freq: Every evening | ORAL | 3 refills | Status: DC | PRN

## 2019-08-03 ENCOUNTER — Other Ambulatory Visit: Payer: Self-pay

## 2019-08-03 MED ORDER — PANTOPRAZOLE SODIUM 40 MG PO TBEC: DELAYED_RELEASE_TABLET | ORAL | 3 refills | Status: DC

## 2019-08-04 ENCOUNTER — Ambulatory Visit: Payer: Self-pay | Attending: Internal Medicine

## 2019-08-04 DIAGNOSIS — Z23 Encounter for immunization: Secondary | ICD-10-CM

## 2019-08-04 NOTE — Progress Notes (Signed)
   Covid-19 Vaccination Clinic  Name:  Wendy Hubbard    MRN: [id number] DOB: 1956/03/04  08/04/2019  [name] was observed post Covid-19 immunization for 30 minutes based on pre-vaccination screening without incident. She was provided with Vaccine Information Sheet and instruction to access the V-Safe system.   [name] was instructed to call 911 with any severe reactions post vaccine: Marland Kitchen Difficulty breathing  . Swelling of face and throat  . A fast heartbeat  . A bad rash all over body  . Dizziness and weakness   Immunizations Administered    Name Date Dose VIS Date Route   Pfizer COVID-19 Vaccine 08/04/2019  1:32 PM 0.3 mL 04/21/2019 Intramuscular   Manufacturer: [place]   Lot: [id number]   [place]: [id number]

## 2019-08-08 ENCOUNTER — Other Ambulatory Visit: Payer: Self-pay | Admitting: Nurse Practitioner

## 2019-08-08 ENCOUNTER — Telehealth: Payer: Self-pay

## 2019-08-08 DIAGNOSIS — S0501XA Injury of conjunctiva and corneal abrasion without foreign body, right eye, initial encounter: Secondary | ICD-10-CM | POA: Diagnosis not present

## 2019-08-08 DIAGNOSIS — M792 Neuralgia and neuritis, unspecified: Secondary | ICD-10-CM

## 2019-08-08 MED ORDER — HYDROCODONE-ACETAMINOPHEN 7.5-325 MG PO TABS: 1.0000 | ORAL_TABLET | ORAL | 0 refills | Status: DC | PRN

## 2019-08-08 NOTE — Telephone Encounter (Signed)
Refilled patient's prescription for hydrocodone/APAP.  And she always does keep her appointments

## 2019-08-08 NOTE — Telephone Encounter (Signed)
Pt advised we send med and advised to keep her follow up

## 2019-08-08 NOTE — Progress Notes (Signed)
Refilled patient's prescription for hydrocodone/APAP.

## 2019-08-22 ENCOUNTER — Telehealth: Payer: Self-pay

## 2019-08-22 NOTE — Telephone Encounter (Signed)
Confirmed and screened for 08-24-19 ov. 

## 2019-08-24 ENCOUNTER — Ambulatory Visit: Payer: BC Managed Care – PPO | Admitting: Nurse Practitioner

## 2019-08-24 ENCOUNTER — Encounter: Payer: Self-pay | Admitting: Nurse Practitioner

## 2019-08-24 ENCOUNTER — Other Ambulatory Visit: Payer: Self-pay

## 2019-08-24 VITALS — BP 128/65 | HR 71 | Resp 16 | Ht 64.0 in | Wt 198.2 lb

## 2019-08-24 DIAGNOSIS — F411 Generalized anxiety disorder: Secondary | ICD-10-CM | POA: Diagnosis not present

## 2019-08-24 DIAGNOSIS — M792 Neuralgia and neuritis, unspecified: Secondary | ICD-10-CM | POA: Diagnosis not present

## 2019-08-24 DIAGNOSIS — Z1231 Encounter for screening mammogram for malignant neoplasm of breast: Secondary | ICD-10-CM

## 2019-08-24 DIAGNOSIS — E1165 Type 2 diabetes mellitus with hyperglycemia: Secondary | ICD-10-CM | POA: Diagnosis not present

## 2019-08-24 LAB — POCT GLYCOSYLATED HEMOGLOBIN (HGB A1C): Hemoglobin A1C: 5.3 % (ref 4.0–5.6)

## 2019-08-24 MED ORDER — HYDROCODONE-ACETAMINOPHEN 7.5-325 MG PO TABS: 1.0000 | ORAL_TABLET | ORAL | 0 refills | Status: DC | PRN

## 2019-08-24 NOTE — Progress Notes (Signed)
[facility] [place], [place] [address]  Internal MEDICINE  Office Visit Note  Patient Name: Wendy Hubbard  [id number]  [id number]  Date of Service: 09/03/2019  Chief Complaint  Patient presents with  . Hypertension  . Diabetes  . Gastroesophageal Reflux  . Hyperlipidemia    The patient is here for routine follow up visit. She does have history of diabetes. Has been off diabetic medication for some time. Her HgbA1c is 5.3 today. She continues to control through diet and physical activity. She continues to grieve the loss of her father. He passed away Jul 14, 2019. She is currently on lexapro 10mg . Feels like this is helping to control overall sadness and depression. She takes alprazolam at bedtime when needed for acute anxiety and insomnia. She states that neuropathy has been more severe. Current dose gabapentin is doing well. Takes hydrocodone/APAP 7.5/325mg  at night if needed. She does need to have refills for this today.       Current Medication: Outpatient Encounter Medications as of 08/24/2019  Medication Sig Note  . ALPRAZolam (XANAX) 0.5 MG tablet Take 1 tablet (0.5 mg total) by mouth at bedtime as needed for anxiety.   . cetirizine (ZYRTEC) 10 MG tablet Take 10 mg by mouth daily.   Marland Kitchen dicyclomine (BENTYL) 10 MG capsule Take 1 capsule (10 mg total) by mouth 4 (four) times daily -  before meals and at bedtime.   . diphenoxylate-atropine (LOMOTIL) 2.5-0.025 MG tablet Take 1 tablet by mouth 4 (four) times daily as needed for diarrhea or loose stools.   Marland Kitchen escitalopram (LEXAPRO) 10 MG tablet Take 1 tablet (10 mg total) by mouth every evening.   . fluticasone (FLONASE) 50 MCG/ACT nasal spray Place 2 sprays into both nostrils daily.   Marland Kitchen HYDROcodone-acetaminophen (NORCO) 7.5-325 MG tablet Take 1 tablet by mouth every 4 (four) hours as needed for moderate pain.   . metoprolol succinate (TOPROL-XL) 50 MG 24 hr tablet Take 1 tablet (50 mg total) by mouth at bedtime.  Take with or immediately following a meal.   . montelukast (SINGULAIR) 10 MG tablet Take 1 tablet (10 mg total) by mouth at bedtime.   . pantoprazole (PROTONIX) 40 MG tablet Take one tablet by mouth daily.   . phenazopyridine (PYRIDIUM) 200 MG tablet Take 1 tablet (200 mg total) by mouth 3 (three) times daily as needed for pain.   . phenazopyridine (PYRIDIUM) 200 MG tablet Take 1 tablet (200 mg total) by mouth 3 (three) times daily as needed for pain.   . rosuvastatin (CRESTOR) 10 MG tablet Take 1 tablet (10 mg total) by mouth daily.   Marland Kitchen spironolactone (ALDACTONE) 50 MG tablet Take 50 mg by mouth daily. 08/22/2014: .   Marland Kitchen tiZANidine (ZANAFLEX) 4 MG tablet TAKE 1 TABLET BY MOUTH EVERYDAY AT BEDTIME   . [DISCONTINUED] gabapentin (NEURONTIN) 300 MG capsule Take 2 capsule po BID. Then take 3 capsules po QHS.   . [DISCONTINUED] HYDROcodone-acetaminophen (NORCO) 7.5-325 MG tablet Take 1 tablet by mouth every 4 (four) hours as needed for moderate pain.   . [DISCONTINUED] chlorpheniramine-HYDROcodone (TUSSIONEX PENNKINETIC ER) 10-8 MG/5ML SUER Take 5 mLs by mouth every 12 (twelve) hours as needed for cough. (Patient not taking: Reported on 08/24/2019)   . [DISCONTINUED] sulfamethoxazole-trimethoprim (BACTRIM) 400-80 MG tablet Take 1 tablet by mouth 2 (two) times daily. (Patient not taking: Reported on 08/24/2019)    No facility-administered encounter medications on file as of 08/24/2019.    Surgical History: Past Surgical History:  Procedure Laterality Date  . ABDOMINAL HYSTERECTOMY    . ANKLE ARTHROSCOPY Bilateral   . APPENDECTOMY    . BLADDER SUSPENSION    . BREAST EXCISIONAL BIOPSY Left 1990   neg  . BREAST EXCISIONAL BIOPSY Right 1989   neg  . BREAST SURGERY    . CHOLECYSTECTOMY    . COLONOSCOPY N/A 11/04/2015   Procedure: COLONOSCOPY;  Surgeon: Manya Silvas, MD;  Location: [facility] ENDOSCOPY;  Service: Endoscopy;  Laterality: N/A;  . ESOPHAGOGASTRODUODENOSCOPY N/A 11/02/2015   Procedure:  ESOPHAGOGASTRODUODENOSCOPY (EGD);  Surgeon: Manya Silvas, MD;  Location: [facility] ENDOSCOPY;  Service: Endoscopy;  Laterality: N/A;  . KNEE ARTHROSCOPY Left   . REDUCTION MAMMAPLASTY Bilateral 1988  . TONSILLECTOMY      Medical History: Past Medical History:  Diagnosis Date  . COPD (chronic obstructive pulmonary disease) ([place])   . Diabetes mellitus without complication ([place])   . GERD (gastroesophageal reflux disease)   . Hyperlipidemia   . Hypertension   . Rapid heart rate   . Sleep apnea     Family History: Family History  Problem Relation Age of Onset  . Hypertension Other   . Bladder Cancer Mother   . Kidney cancer Neg Hx   . Prostate cancer Neg Hx   . Breast cancer Neg Hx     Social History   Socioeconomic History  . Marital status: Married    Spouse name: Not on file  . Number of children: Not on file  . Years of education: Not on file  . Highest education level: Not on file  Occupational History  . Not on file  Tobacco Use  . Smoking status: Former Research scientist (life sciences)  . Smokeless tobacco: Never Used  . Tobacco comment: quit in 1996  Substance and Sexual Activity  . Alcohol use: Yes    Alcohol/week: 0.0 standard drinks    Comment: occasional  . Drug use: No  . Sexual activity: Not on file  Other Topics Concern  . Not on file  Social History Narrative  . Not on file   Social Determinants of Health   Financial Resource Strain:   . Difficulty of Paying Living Expenses:   Food Insecurity:   . Worried About Charity fundraiser in the Last Year:   . Arboriculturist in the Last Year:   Transportation Needs:   . Film/video editor (Medical):   Marland Kitchen Lack of Transportation (Non-Medical):   Physical Activity:   . Days of Exercise per Week:   . Minutes of Exercise per Session:   Stress:   . Feeling of Stress :   Social Connections:   . Frequency of Communication with Friends and Family:   . Frequency of Social Gatherings with Friends and Family:   . Attends  Religious Services:   . Active Member of Clubs or Organizations:   . Attends Archivist Meetings:   Marland Kitchen Marital Status:   Intimate Partner Violence:   . Fear of Current or Ex-Partner:   . Emotionally Abused:   Marland Kitchen Physically Abused:   . Sexually Abused:       Review of Systems  Constitutional: Positive for fatigue. Negative for chills and unexpected weight change.  HENT: Negative for congestion, postnasal drip, rhinorrhea, sneezing and sore throat.   Eyes: Negative for itching.  Respiratory: Negative for cough, chest tightness, shortness of breath and wheezing.   Cardiovascular: Negative for chest pain and palpitations.  Gastrointestinal: Negative for abdominal pain, constipation, diarrhea, nausea and  vomiting.  Endocrine: Negative for cold intolerance, heat intolerance, polydipsia and polyuria.       Continues to have good control of blood sugars without use of diabetic medication.   Musculoskeletal: Positive for arthralgias and myalgias. Negative for back pain, joint swelling and neck pain.        Neuropathy in both feet .feels like burning sensation. Worse at night, especially when sheets or other material is touching her feet .   Skin: Negative for rash.  Allergic/Immunologic: Negative for environmental allergies.  Neurological: Positive for numbness. Negative for tremors.       Numbness with significant neuropathy in both feet.   Hematological: Negative for adenopathy. Does not bruise/bleed easily.  Psychiatric/Behavioral: Negative for behavioral problems (Depression), sleep disturbance and suicidal ideas. The patient is not nervous/anxious.     Today's Vitals   08/24/19 1447  BP: 128/65  Pulse: 71  Resp: 16  SpO2: 98%  Weight: 198 lb 3.2 oz (89.9 kg)  Height: 5\' 4"  (1.626 m)   Body mass index is 34.02 kg/m.  Physical Exam Vitals and nursing note reviewed.  Constitutional:      General: She is not in acute distress.    Appearance: Normal appearance. She is  well-developed. She is not diaphoretic.  HENT:     Head: Normocephalic and atraumatic.     Mouth/Throat:     Pharynx: No oropharyngeal exudate.  Eyes:     Pupils: Pupils are equal, round, and reactive to light.  Neck:     Thyroid: No thyromegaly.     Vascular: No JVD.     Trachea: No tracheal deviation.  Cardiovascular:     Rate and Rhythm: Normal rate and regular rhythm.     Heart sounds: Normal heart sounds. No murmur. No friction rub. No gallop.   Pulmonary:     Effort: Pulmonary effort is normal. No respiratory distress.     Breath sounds: Normal breath sounds. No wheezing or rales.  Chest:     Chest wall: No tenderness.  Abdominal:     Palpations: Abdomen is soft.  Musculoskeletal:        General: Normal range of motion.     Cervical back: Normal range of motion and neck supple.  Lymphadenopathy:     Cervical: No cervical adenopathy.  Skin:    General: Skin is warm and dry.  Neurological:     Mental Status: She is alert and oriented to person, place, and time.     Cranial Nerves: No cranial nerve deficit.  Psychiatric:        Mood and Affect: Mood normal.        Behavior: Behavior normal.        Thought Content: Thought content normal.        Judgment: Judgment normal.    Assessment/Plan: 1. Type 2 diabetes mellitus with hyperglycemia, without [place]-term current use of insulin (HCC) - POCT HgB A1C 5.3 today. Continue to control with diet and exercise. Monitor blood sugars closely.   2. Neuralgia and neuritis, unspecified May take hydrocodone/APAP 7.5/325mg  tablets at bedtime as needed. New prescription sent to her pharamcy today.  - HYDROcodone-acetaminophen (NORCO) 7.5-325 MG tablet; Take 1 tablet by mouth every 4 (four) hours as needed for moderate pain.  Dispense: 30 tablet; Refill: 0  3. Generalized anxiety disorder Continue lexapro daily. May take alprazolam as needed and as prescribed   4. Encounter for screening mammogram for malignant neoplasm of breast -  MM DIGITAL SCREENING BILATERAL; Future  General Counseling: Felicidad verbalizes understanding of the findings of todays visit and agrees with plan of treatment. I have discussed any further diagnostic evaluation that may be needed or ordered today. We also reviewed her medications today. she has been encouraged to call the office with any questions or concerns that should arise related to todays visit.  Diabetes Counseling:  1. Addition of ACE inh/ ARB'S for nephroprotection. Microalbumin is updated  2. Diabetic foot care, prevention of complications. Podiatry consult 3. Exercise and lose weight.  4. Diabetic eye examination, Diabetic eye exam is updated  5. Monitor blood sugar closlely. nutrition counseling.  6. Sign and symptoms of hypoglycemia including shaking sweating,confusion and headaches.  This patient was seen by Leretha Pol FNP Collaboration with Dr Lavera Guise as a part of collaborative care agreement  Orders Placed This Encounter  Procedures  . MM DIGITAL SCREENING BILATERAL  . POCT HgB A1C    Meds ordered this encounter  Medications  . HYDROcodone-acetaminophen (NORCO) 7.5-325 MG tablet    Sig: Take 1 tablet by mouth every 4 (four) hours as needed for moderate pain.    Dispense:  30 tablet    Refill:  0    Fill after 09/06/2019    Order Specific Question:   Supervising Provider    Answer:   Lavera Guise [id number]    Total time spent: 30 Minutes   Time spent includes review of chart, medications, test results, and follow up plan with the patient.      Dr Lavera Guise Internal medicine

## 2019-08-28 ENCOUNTER — Other Ambulatory Visit: Payer: Self-pay

## 2019-08-28 DIAGNOSIS — M792 Neuralgia and neuritis, unspecified: Secondary | ICD-10-CM

## 2019-08-28 MED ORDER — GABAPENTIN 300 MG PO CAPS: ORAL_CAPSULE | ORAL | 1 refills | Status: DC

## 2019-08-29 ENCOUNTER — Ambulatory Visit: Payer: Self-pay | Attending: Internal Medicine

## 2019-08-29 DIAGNOSIS — Z23 Encounter for immunization: Secondary | ICD-10-CM

## 2019-08-29 NOTE — Progress Notes (Signed)
   Covid-19 Vaccination Clinic  Name:  Wendy Hubbard    MRN: [id number] DOB: [birth year]  08/29/2019  [name] was observed post Covid-19 immunization for 30 minutes based on pre-vaccination screening without incident. She was provided with Vaccine Information Sheet and instruction to access the V-Safe system.   [name] was instructed to call 911 with any severe reactions post vaccine: Marland Kitchen Difficulty breathing  . Swelling of face and throat  . A fast heartbeat  . A bad rash all over body  . Dizziness and weakness   Immunizations Administered    Name Date Dose VIS Date Route   Pfizer COVID-19 Vaccine 08/29/2019 10:11 AM 0.3 mL 07/05/2018 Intramuscular   Manufacturer: [place]   Lot: [id number]   [place]: [id number]

## 2019-09-13 DIAGNOSIS — H5213 Myopia, bilateral: Secondary | ICD-10-CM | POA: Diagnosis not present

## 2019-09-13 DIAGNOSIS — H11823 Conjunctivochalasis, bilateral: Secondary | ICD-10-CM | POA: Diagnosis not present

## 2019-09-13 DIAGNOSIS — H04223 Epiphora due to insufficient drainage, bilateral lacrimal glands: Secondary | ICD-10-CM | POA: Diagnosis not present

## 2019-09-15 ENCOUNTER — Telehealth: Payer: Self-pay

## 2019-09-15 NOTE — Telephone Encounter (Signed)
Confirmed appointment on 09/18/2019 and screened for covid. klh

## 2019-09-18 ENCOUNTER — Encounter: Payer: Self-pay | Admitting: Adult Health

## 2019-09-18 ENCOUNTER — Other Ambulatory Visit: Payer: Self-pay

## 2019-09-18 ENCOUNTER — Other Ambulatory Visit: Payer: Self-pay | Admitting: Nurse Practitioner

## 2019-09-18 ENCOUNTER — Ambulatory Visit: Payer: BC Managed Care – PPO | Admitting: Adult Health

## 2019-09-18 VITALS — BP 130/61 | HR 67 | Temp 97.3°F | Resp 16 | Ht 64.0 in | Wt 197.2 lb

## 2019-09-18 DIAGNOSIS — F411 Generalized anxiety disorder: Secondary | ICD-10-CM | POA: Diagnosis not present

## 2019-09-18 DIAGNOSIS — R5383 Other fatigue: Secondary | ICD-10-CM

## 2019-09-18 DIAGNOSIS — E1165 Type 2 diabetes mellitus with hyperglycemia: Secondary | ICD-10-CM | POA: Diagnosis not present

## 2019-09-18 DIAGNOSIS — I10 Essential (primary) hypertension: Secondary | ICD-10-CM | POA: Diagnosis not present

## 2019-09-18 DIAGNOSIS — E559 Vitamin D deficiency, unspecified: Secondary | ICD-10-CM | POA: Diagnosis not present

## 2019-09-18 DIAGNOSIS — Z0001 Encounter for general adult medical examination with abnormal findings: Secondary | ICD-10-CM | POA: Diagnosis not present

## 2019-09-18 NOTE — Progress Notes (Signed)
[facility] [place], [place] [address]  Internal MEDICINE  Office Visit Note  Patient Name: Wendy Hubbard  [id number]  [id number]  Date of Service: 10/08/2019  Chief Complaint  Patient presents with  . Fatigue    going on for a couple of weeks, stress from work      HPI Pt is here for a sick visit. Pt reports over the last few weeks she has noticed some fatigue that seems to be worsening.  She does report some excessive stress with work. She does not have any other symptoms.  She denies any vomiting, diarrhea, or fever.         Current Medication:  Outpatient Encounter Medications as of 09/18/2019  Medication Sig Note  . ALPRAZolam (XANAX) 0.5 MG tablet Take 1 tablet (0.5 mg total) by mouth at bedtime as needed for anxiety.   . cetirizine (ZYRTEC) 10 MG tablet Take 10 mg by mouth daily.   Marland Kitchen dicyclomine (BENTYL) 10 MG capsule Take 1 capsule (10 mg total) by mouth 4 (four) times daily -  before meals and at bedtime.   . diphenoxylate-atropine (LOMOTIL) 2.5-0.025 MG tablet Take 1 tablet by mouth 4 (four) times daily as needed for diarrhea or loose stools.   Marland Kitchen escitalopram (LEXAPRO) 10 MG tablet Take 1 tablet (10 mg total) by mouth every evening.   . fluticasone (FLONASE) 50 MCG/ACT nasal spray Place 2 sprays into both nostrils daily.   Marland Kitchen gabapentin (NEURONTIN) 300 MG capsule Take 2 capsule po BID. Then take 3 capsules po QHS.   Marland Kitchen HYDROcodone-acetaminophen (NORCO) 7.5-325 MG tablet Take 1 tablet by mouth every 4 (four) hours as needed for moderate pain.   . metoprolol succinate (TOPROL-XL) 50 MG 24 hr tablet Take 1 tablet (50 mg total) by mouth at bedtime. Take with or immediately following a meal.   . pantoprazole (PROTONIX) 40 MG tablet Take one tablet by mouth daily.   . phenazopyridine (PYRIDIUM) 200 MG tablet Take 1 tablet (200 mg total) by mouth 3 (three) times daily as needed for pain.   . phenazopyridine (PYRIDIUM) 200 MG tablet Take 1 tablet (200 mg  total) by mouth 3 (three) times daily as needed for pain.   . rosuvastatin (CRESTOR) 10 MG tablet Take 1 tablet (10 mg total) by mouth daily.   Marland Kitchen spironolactone (ALDACTONE) 50 MG tablet Take 50 mg by mouth daily. 08/22/2014: .   Marland Kitchen tiZANidine (ZANAFLEX) 4 MG tablet TAKE 1 TABLET BY MOUTH EVERYDAY AT BEDTIME   . [DISCONTINUED] montelukast (SINGULAIR) 10 MG tablet Take 1 tablet (10 mg total) by mouth at bedtime.    No facility-administered encounter medications on file as of 09/18/2019.      Medical History: Past Medical History:  Diagnosis Date  . COPD (chronic obstructive pulmonary disease) ([place])   . Diabetes mellitus without complication ([place])   . GERD (gastroesophageal reflux disease)   . Hyperlipidemia   . Hypertension   . Rapid heart rate   . Sleep apnea      Vital Signs: BP 130/61   Pulse 67   Temp (!) 97.3 F (36.3 C)   Resp 16   Ht 5\' 4"  (1.626 m)   Wt 197 lb 3.2 oz (89.4 kg)   SpO2 96%   BMI 33.85 kg/m    Review of Systems  Constitutional: Positive for fatigue. Negative for chills and unexpected weight change.  HENT: Negative for congestion, rhinorrhea, sneezing and sore throat.   Eyes: Negative for  photophobia, pain and redness.  Respiratory: Negative for cough, chest tightness and shortness of breath.   Cardiovascular: Negative for chest pain and palpitations.  Gastrointestinal: Negative for abdominal pain, constipation, diarrhea, nausea and vomiting.  Endocrine: Negative.   Genitourinary: Negative for dysuria and frequency.  Musculoskeletal: Negative for arthralgias, back pain, joint swelling and neck pain.  Skin: Negative for rash.  Allergic/Immunologic: Negative.   Neurological: Negative for tremors and numbness.  Hematological: Negative for adenopathy. Does not bruise/bleed easily.  Psychiatric/Behavioral: Negative for behavioral problems and sleep disturbance. The patient is not nervous/anxious.     Physical Exam Vitals and nursing note reviewed.   Constitutional:      General: She is not in acute distress.    Appearance: She is well-developed. She is not diaphoretic.  HENT:     Head: Normocephalic and atraumatic.     Mouth/Throat:     Pharynx: No oropharyngeal exudate.  Eyes:     Pupils: Pupils are equal, round, and reactive to light.  Neck:     Thyroid: No thyromegaly.     Vascular: No JVD.     Trachea: No tracheal deviation.  Cardiovascular:     Rate and Rhythm: Normal rate and regular rhythm.     Heart sounds: Normal heart sounds. No murmur. No friction rub. No gallop.   Pulmonary:     Effort: Pulmonary effort is normal. No respiratory distress.     Breath sounds: Normal breath sounds. No wheezing or rales.  Chest:     Chest wall: No tenderness.  Abdominal:     Palpations: Abdomen is soft.     Tenderness: There is no abdominal tenderness. There is no guarding.  Musculoskeletal:        General: Normal range of motion.     Cervical back: Normal range of motion and neck supple.  Lymphadenopathy:     Cervical: No cervical adenopathy.  Skin:    General: Skin is warm and dry.  Neurological:     Mental Status: She is alert and oriented to person, place, and time.     Cranial Nerves: No cranial nerve deficit.  Psychiatric:        Behavior: Behavior normal.        Thought Content: Thought content normal.        Judgment: Judgment normal.     Assessment/Plan: 1. Other fatigue Will check fatigue labs to rule out iron, vit D or B12 deficency. - B12 and Folate Panel - Vitamin D (25 hydroxy) - Fe+TIBC+Fer  2. Type 2 diabetes mellitus with hyperglycemia, without Vasil-term current use of insulin (HCC) One month ago A1C wsa 5.3. No recent issues with blood glucose.   3. Generalized anxiety disorder Stable, continue present management.   General Counseling: Wendy Hubbard verbalizes understanding of the findings of todays visit and agrees with plan of treatment. I have discussed any further diagnostic evaluation that may be  needed or ordered today. We also reviewed her medications today. she has been encouraged to call the office with any questions or concerns that should arise related to todays visit.   Orders Placed This Encounter  Procedures  . B12 and Folate Panel  . Vitamin D (25 hydroxy)  . Fe+TIBC+Fer    No orders of the defined types were placed in this encounter.   Time spent: 30 Minutes  This patient was seen by Orson Gear AGNP-C in Collaboration with Dr Lavera Guise as a part of collaborative care agreement.  Kendell Bane AGNP-C Internal  Medicine

## 2019-09-19 LAB — LIPID PANEL WITH LDL/HDL RATIO
Cholesterol, Total: 129 mg/dL (ref 100–199)
HDL: 47 mg/dL (ref >39)
LDL Chol Calc (NIH): 50 mg/dL (ref 0–99)
LDL/HDL Ratio: 1.1 ratio (ref 0.0–3.2)
Triglycerides: 194 mg/dL — ABNORMAL HIGH (ref 0–149)
VLDL Cholesterol Cal: 32 mg/dL (ref 5–40)

## 2019-09-19 LAB — VITAMIN D 25 HYDROXY (VIT D DEFICIENCY, FRACTURES): Vit D, 25-Hydroxy: 36.5 ng/mL (ref 30.0–100.0)

## 2019-09-19 LAB — COMPREHENSIVE METABOLIC PANEL
ALT: 15 IU/L (ref 0–32)
AST: 15 IU/L (ref 0–40)
Albumin/Globulin Ratio: 1.9 (ref 1.2–2.2)
Albumin: 4.6 g/dL (ref 3.8–4.8)
Alkaline Phosphatase: 112 IU/L (ref 39–117)
BUN/Creatinine Ratio: 19 (ref 12–28)
BUN: 19 mg/dL (ref 8–27)
Bilirubin Total: 0.4 mg/dL (ref 0.0–1.2)
CO2: 24 mmol/L (ref 20–29)
Calcium: 9.6 mg/dL (ref 8.7–10.3)
Chloride: 106 mmol/L (ref 96–106)
Creatinine, Ser: 0.98 mg/dL (ref 0.57–1.00)
GFR calc Af Amer: 71 mL/min/{1.73_m2} (ref >59)
GFR calc non Af Amer: 61 mL/min/{1.73_m2} (ref >59)
Globulin, Total: 2.4 g/dL (ref 1.5–4.5)
Glucose: 103 mg/dL — ABNORMAL HIGH (ref 65–99)
Potassium: 5.2 mmol/L (ref 3.5–5.2)
Sodium: 142 mmol/L (ref 134–144)
Total Protein: 7 g/dL (ref 6.0–8.5)

## 2019-09-19 LAB — T4, FREE: Free T4: 1.17 ng/dL (ref 0.82–1.77)

## 2019-09-19 LAB — IRON AND TIBC
Iron Saturation: 33 % (ref 15–55)
Iron: 100 ug/dL (ref 27–139)
Total Iron Binding Capacity: 306 ug/dL (ref 250–450)
UIBC: 206 ug/dL (ref 118–369)

## 2019-09-19 LAB — CBC
Hematocrit: 41.7 % (ref 34.0–46.6)
Hemoglobin: 14 g/dL (ref 11.1–15.9)
MCH: 31.7 pg (ref 26.6–33.0)
MCHC: 33.6 g/dL (ref 31.5–35.7)
MCV: 94 fL (ref 79–97)
Platelets: 323 10*3/uL (ref 150–450)
RBC: 4.42 x10E6/uL (ref 3.77–5.28)
RDW: 11.5 % — ABNORMAL LOW (ref 11.7–15.4)
WBC: 9.1 10*3/uL (ref 3.4–10.8)

## 2019-09-19 LAB — FERRITIN: Ferritin: 92 ng/mL (ref 15–150)

## 2019-09-19 LAB — TSH: TSH: 1.52 u[IU]/mL (ref 0.450–4.500)

## 2019-09-19 LAB — B12 AND FOLATE PANEL
Folate: >20 ng/mL (ref >3.0)
Vitamin B-12: 763 pg/mL (ref 232–1245)

## 2019-09-21 ENCOUNTER — Other Ambulatory Visit: Payer: Self-pay

## 2019-09-21 DIAGNOSIS — J301 Allergic rhinitis due to pollen: Secondary | ICD-10-CM

## 2019-09-21 MED ORDER — MONTELUKAST SODIUM 10 MG PO TABS: 10.0000 mg | ORAL_TABLET | Freq: Every day | ORAL | 3 refills | Status: DC

## 2019-09-25 ENCOUNTER — Telehealth: Payer: Self-pay

## 2019-09-25 NOTE — Telephone Encounter (Signed)
Advised patient of lab results and if she is still feeling fatigued then to schedule an appointment for further work up due to mostly of her labs were within normal range per provider. Beth

## 2019-10-04 ENCOUNTER — Telehealth: Payer: Self-pay

## 2019-10-04 NOTE — Telephone Encounter (Signed)
Lmom to confirm and screen for 10-06-19 ov.

## 2019-10-06 ENCOUNTER — Telehealth: Payer: Self-pay

## 2019-10-06 ENCOUNTER — Ambulatory Visit: Payer: BC Managed Care – PPO | Admitting: Nurse Practitioner

## 2019-10-06 NOTE — Telephone Encounter (Signed)
Patient cancelled appointment on 10/06/2019. klh

## 2019-10-17 ENCOUNTER — Other Ambulatory Visit: Payer: Self-pay

## 2019-10-17 MED ORDER — TIZANIDINE HCL 4 MG PO TABS: ORAL_TABLET | ORAL | 3 refills | Status: DC

## 2019-10-23 ENCOUNTER — Telehealth: Payer: Self-pay

## 2019-10-23 NOTE — Telephone Encounter (Signed)
Confirmed and screened for 10-24-19 ov.

## 2019-10-24 ENCOUNTER — Ambulatory Visit: Payer: BC Managed Care – PPO | Admitting: Nurse Practitioner

## 2019-10-24 ENCOUNTER — Encounter: Payer: Self-pay | Admitting: Nurse Practitioner

## 2019-10-24 ENCOUNTER — Other Ambulatory Visit: Payer: Self-pay

## 2019-10-24 VITALS — BP 146/62 | HR 62 | Temp 97.5°F | Resp 16 | Ht 64.0 in | Wt 197.6 lb

## 2019-10-24 DIAGNOSIS — M7918 Myalgia, other site: Secondary | ICD-10-CM

## 2019-10-24 MED ORDER — TIZANIDINE HCL 4 MG PO TABS: ORAL_TABLET | ORAL | 3 refills | Status: DC

## 2019-10-24 MED ORDER — PREDNISONE 10 MG (21) PO TBPK: ORAL_TABLET | ORAL | 0 refills | Status: DC

## 2019-10-24 NOTE — Progress Notes (Signed)
[facility] [place], [place] [address]  Internal MEDICINE  Office Visit Note  Patient Name: Wendy Hubbard  [id number]  [id number]  Date of Service: 10/25/2019  Chief Complaint  Patient presents with  . Shoulder Pain    Right shoulder pain into lower part of the shoulder blade; has tried ice/heat and nothing is helping     The patient is here for sick visit. Wendy Hubbard states that Wendy Hubbard has developed pain under Wendy Hubbard right shoulder pain, just under the shoulder blade. Wendy Hubbard states that it started yesterday. This pain is coming and going. States that when pain is bad it can be 8 to 9 in severity. Wendy Hubbard has taken Wendy Hubbard muscle relaxer, applied heat to the area and nothing has helped. Pain nearly takes Wendy Hubbard breath away. Wendy Hubbard states that Wendy Hubbard has not eaten anything different. Wendy Hubbard denies any injury or trauma to the right shoulder. Wendy Hubbard does sit all day Wendy Hubbard at the computer. Wendy Hubbard does have full rom and strength in right arm. Wendy Hubbard has no chest pain, pressure, or shortness of breath.   Pt is here for a sick visit.     Current Medication:  Outpatient Encounter Medications as of 10/24/2019  Medication Sig Note  . ALPRAZolam (XANAX) 0.5 MG tablet Take 1 tablet (0.5 mg total) by mouth at bedtime as needed for anxiety.   . cetirizine (ZYRTEC) 10 MG tablet Take 10 mg by mouth daily.   Marland Kitchen dicyclomine (BENTYL) 10 MG capsule Take 1 capsule (10 mg total) by mouth 4 (four) times daily -  before meals and at bedtime.   . diphenoxylate-atropine (LOMOTIL) 2.5-0.025 MG tablet Take 1 tablet by mouth 4 (four) times daily as needed for diarrhea or loose stools.   Marland Kitchen escitalopram (LEXAPRO) 10 MG tablet Take 1 tablet (10 mg total) by mouth every evening.   . fluticasone (FLONASE) 50 MCG/ACT nasal spray Place 2 sprays into both nostrils daily.   Marland Kitchen gabapentin (NEURONTIN) 300 MG capsule Take 2 capsule po BID. Then take 3 capsules po QHS.   Marland Kitchen HYDROcodone-acetaminophen (NORCO) 7.5-325 MG tablet Take 1 tablet by  mouth every 4 (four) hours as needed for moderate pain.   . metoprolol succinate (TOPROL-XL) 50 MG 24 hr tablet Take 1 tablet (50 mg total) by mouth at bedtime. Take with or immediately following a meal.   . montelukast (SINGULAIR) 10 MG tablet Take 1 tablet (10 mg total) by mouth at bedtime.   . pantoprazole (PROTONIX) 40 MG tablet Take one tablet by mouth daily.   . rosuvastatin (CRESTOR) 10 MG tablet Take 1 tablet (10 mg total) by mouth daily.   Marland Kitchen spironolactone (ALDACTONE) 50 MG tablet Take 50 mg by mouth daily. 08/22/2014: .   Marland Kitchen tiZANidine (ZANAFLEX) 4 MG tablet Take 1 tablet po BID prn muscle pain/spasms.   . [DISCONTINUED] tiZANidine (ZANAFLEX) 4 MG tablet TAKE 1 TABLET BY MOUTH EVERYDAY AT BEDTIME   . predniSONE (STERAPRED UNI-PAK 21 TAB) 10 MG (21) TBPK tablet 6 day taper - take by mouth as directed for 6 days   . [DISCONTINUED] phenazopyridine (PYRIDIUM) 200 MG tablet Take 1 tablet (200 mg total) by mouth 3 (three) times daily as needed for pain.   . [DISCONTINUED] phenazopyridine (PYRIDIUM) 200 MG tablet Take 1 tablet (200 mg total) by mouth 3 (three) times daily as needed for pain.    No facility-administered encounter medications on file as of 10/24/2019.      Medical History: Past Medical History:  Diagnosis Date  .  COPD (chronic obstructive pulmonary disease) ([place])   . Diabetes mellitus without complication ([place])   . GERD (gastroesophageal reflux disease)   . Hyperlipidemia   . Hypertension   . Rapid heart rate   . Sleep apnea      Today's Vitals   10/24/19 0857  BP: (!) 146/62  Pulse: 62  Resp: 16  Temp: (!) 97.5 F (36.4 C)  SpO2: 95%  Weight: 197 lb 9.6 oz (89.6 kg)  Height: 5\' 4"  (1.626 m)   Body mass index is 33.92 kg/m.  Review of Systems  Constitutional: Positive for activity change and fatigue. Negative for chills and unexpected weight change.  HENT: Negative for congestion, postnasal drip, rhinorrhea, sneezing and sore throat.   Respiratory:  Negative for cough, chest tightness and shortness of breath.   Cardiovascular: Negative for chest pain and palpitations.  Gastrointestinal: Negative for abdominal pain, constipation, diarrhea, nausea and vomiting.  Musculoskeletal: Positive for arthralgias, back pain and myalgias. Negative for joint swelling and neck pain.       Tenderness of area in between the right shoulder blade and the spine. Pain is moderate to severe  Hurts more when taking a deep breath as well as twisting Wendy Hubbard upper body to look from side to side. Wendy Hubbard has full ROM and strength of the right arm.   Skin: Negative for rash.  Allergic/Immunologic: Negative for environmental allergies.  Neurological: Negative for dizziness, tremors, numbness and headaches.  Hematological: Negative for adenopathy. Does not bruise/bleed easily.  Psychiatric/Behavioral: Negative for behavioral problems (Depression), sleep disturbance and suicidal ideas. The patient is not nervous/anxious.     Physical Exam Vitals and nursing note reviewed.  Constitutional:      General: Wendy Hubbard is not in acute distress.    Appearance: Normal appearance. Wendy Hubbard is well-developed. Wendy Hubbard is not diaphoretic.  HENT:     Head: Normocephalic and atraumatic.     Mouth/Throat:     Pharynx: No oropharyngeal exudate.  Eyes:     Pupils: Pupils are equal, round, and reactive to light.  Neck:     Thyroid: No thyromegaly.     Vascular: No JVD.     Trachea: No tracheal deviation.  Cardiovascular:     Rate and Rhythm: Normal rate and regular rhythm.     Heart sounds: Normal heart sounds. No murmur heard.  No friction rub. No gallop.   Pulmonary:     Effort: Pulmonary effort is normal. No respiratory distress.     Breath sounds: Normal breath sounds. No wheezing or rales.  Chest:     Chest wall: No tenderness.  Abdominal:     Palpations: Abdomen is soft.  Musculoskeletal:        General: Normal range of motion.       Arms:     Cervical back: Normal range of motion and  neck supple.     Comments: Wendy Hubbard has intact strength and ROM of right should   Lymphadenopathy:     Cervical: No cervical adenopathy.  Skin:    General: Skin is warm and dry.  Neurological:     Mental Status: Wendy Hubbard is alert and oriented to person, place, and time.     Cranial Nerves: No cranial nerve deficit.  Psychiatric:        Mood and Affect: Mood normal.        Behavior: Behavior normal.        Thought Content: Thought content normal.        Judgment: Judgment normal.  Assessment/Plan:  1. Rhomboid muscle pain Start prednisone taper. Take as directed for 6 days. May take muscle relaxer twice daily as needed for muscle pain/spasms. Recommended Wendy Hubbard apply low heat the affected area. Also recommended massage to relieve tight muscles.  - predniSONE (STERAPRED UNI-PAK 21 TAB) 10 MG (21) TBPK tablet; 6 day taper - take by mouth as directed for 6 days  Dispense: 21 tablet; Refill: 0 - tiZANidine (ZANAFLEX) 4 MG tablet; Take 1 tablet po BID prn muscle pain/spasms.  Dispense: 45 tablet; Refill: 3  General Counseling: Wendy Hubbard verbalizes understanding of the findings of todays visit and agrees with plan of treatment. I have discussed any further diagnostic evaluation that may be needed or ordered today. We also reviewed Wendy Hubbard medications today. Wendy Hubbard has been encouraged to call the office with any questions or concerns that should arise related to todays visit.    Counseling:  This patient was seen by [place] with Dr Lavera Guise as a part of collaborative care agreement  Meds ordered this encounter  Medications  . predniSONE (STERAPRED UNI-PAK 21 TAB) 10 MG (21) TBPK tablet    Sig: 6 day taper - take by mouth as directed for 6 days    Dispense:  21 tablet    Refill:  0    Order Specific Question:   Supervising Provider    Answer:   Lavera Guise [place]  . tiZANidine (ZANAFLEX) 4 MG tablet    Sig: Take 1 tablet po BID prn muscle pain/spasms.    Dispense:  45 tablet     Refill:  3    Updating prescription only.    Order Specific Question:   Supervising Provider    Answer:   Lavera Guise [[id number]]    Time spent: 20 Minutes

## 2019-10-25 ENCOUNTER — Ambulatory Visit: Payer: BC Managed Care – PPO | Admitting: Podiatry

## 2019-10-25 DIAGNOSIS — M7918 Myalgia, other site: Secondary | ICD-10-CM | POA: Insufficient documentation

## 2019-11-06 ENCOUNTER — Other Ambulatory Visit: Payer: Self-pay

## 2019-11-06 ENCOUNTER — Telehealth: Payer: Self-pay

## 2019-11-06 DIAGNOSIS — M9903 Segmental and somatic dysfunction of lumbar region: Secondary | ICD-10-CM | POA: Diagnosis not present

## 2019-11-06 DIAGNOSIS — M9902 Segmental and somatic dysfunction of thoracic region: Secondary | ICD-10-CM | POA: Diagnosis not present

## 2019-11-06 DIAGNOSIS — M9901 Segmental and somatic dysfunction of cervical region: Secondary | ICD-10-CM | POA: Diagnosis not present

## 2019-11-06 DIAGNOSIS — M792 Neuralgia and neuritis, unspecified: Secondary | ICD-10-CM

## 2019-11-06 DIAGNOSIS — G44209 Tension-type headache, unspecified, not intractable: Secondary | ICD-10-CM | POA: Diagnosis not present

## 2019-11-06 MED ORDER — HYDROCODONE-ACETAMINOPHEN 7.5-325 MG PO TABS: 1.0000 | ORAL_TABLET | ORAL | 0 refills | Status: DC | PRN

## 2019-11-06 NOTE — Telephone Encounter (Signed)
Pt advised we send med  

## 2019-11-08 ENCOUNTER — Ambulatory Visit: Payer: BC Managed Care – PPO

## 2019-11-08 ENCOUNTER — Ambulatory Visit (INDEPENDENT_AMBULATORY_CARE_PROVIDER_SITE_OTHER): Payer: BC Managed Care – PPO | Admitting: Podiatry

## 2019-11-08 ENCOUNTER — Encounter: Payer: Self-pay | Admitting: Podiatry

## 2019-11-08 ENCOUNTER — Other Ambulatory Visit: Payer: Self-pay

## 2019-11-08 DIAGNOSIS — M7741 Metatarsalgia, right foot: Secondary | ICD-10-CM

## 2019-11-08 DIAGNOSIS — E1142 Type 2 diabetes mellitus with diabetic polyneuropathy: Secondary | ICD-10-CM | POA: Diagnosis not present

## 2019-11-08 DIAGNOSIS — M7742 Metatarsalgia, left foot: Secondary | ICD-10-CM

## 2019-11-08 DIAGNOSIS — R2681 Unsteadiness on feet: Secondary | ICD-10-CM | POA: Diagnosis not present

## 2019-11-08 DIAGNOSIS — G5793 Unspecified mononeuropathy of bilateral lower limbs: Secondary | ICD-10-CM

## 2019-11-08 MED ORDER — GABAPENTIN 600 MG PO TABS
ORAL_TABLET | ORAL | 3 refills | Status: DC
Start: 2019-11-08 — End: 2021-01-14

## 2019-11-08 NOTE — Progress Notes (Signed)
Subjective:  Patient ID: Wendy Hubbard, female    DOB: 10-May-1956,  MRN: [id number] HPI Chief Complaint  Patient presents with  . Foot Pain    Patient states she has severe neuropathy x years, affects her feet, ankles and up to mid shin, does take gabapentin 1200mg  daily, she's having to take more pain meds recently, pins/needles sensations worse, gets weakness in ankle, can't find comfy shoes  . New Patient (Initial Visit)    64 y.o. female presents with the above complaint.   ROS: Denies fever chills nausea vomiting muscle aches pains calf pain back pain chest pain shortness of breath.  For  Past Medical History:  Diagnosis Date  . COPD (chronic obstructive pulmonary disease) ([place])   . GERD (gastroesophageal reflux disease)   . Hyperlipidemia   . Hypertension   . Rapid heart rate   . Sleep apnea    Past Surgical History:  Procedure Laterality Date  . ABDOMINAL HYSTERECTOMY    . ANKLE ARTHROSCOPY Bilateral   . APPENDECTOMY    . BLADDER SUSPENSION    . BREAST EXCISIONAL BIOPSY Left 1990   neg  . BREAST EXCISIONAL BIOPSY Right 1989   neg  . BREAST SURGERY    . CHOLECYSTECTOMY    . COLONOSCOPY N/A 11/04/2015   Procedure: COLONOSCOPY;  Surgeon: Manya Silvas, MD;  Location: [facility] ENDOSCOPY;  Service: Endoscopy;  Laterality: N/A;  . ESOPHAGOGASTRODUODENOSCOPY N/A 11/02/2015   Procedure: ESOPHAGOGASTRODUODENOSCOPY (EGD);  Surgeon: Manya Silvas, MD;  Location: [facility] ENDOSCOPY;  Service: Endoscopy;  Laterality: N/A;  . KNEE ARTHROSCOPY Left   . REDUCTION MAMMAPLASTY Bilateral 1988  . TONSILLECTOMY      Current Outpatient Medications:  .  ALPRAZolam (XANAX) 0.5 MG tablet, Take 1 tablet (0.5 mg total) by mouth at bedtime as needed for anxiety., Disp: 30 tablet, Rfl: 3 .  cetirizine (ZYRTEC) 10 MG tablet, Take 10 mg by mouth daily., Disp: , Rfl:  .  dicyclomine (BENTYL) 10 MG capsule, Take 1 capsule (10 mg total) by mouth 4 (four) times daily -  before meals and at bedtime.,  Disp: 90 capsule, Rfl: 2 .  diphenoxylate-atropine (LOMOTIL) 2.5-0.025 MG tablet, Take 1 tablet by mouth 4 (four) times daily as needed for diarrhea or loose stools., Disp: 30 tablet, Rfl: 1 .  escitalopram (LEXAPRO) 10 MG tablet, Take 1 tablet (10 mg total) by mouth every evening., Disp: 90 tablet, Rfl: 1 .  fluticasone (FLONASE) 50 MCG/ACT nasal spray, Place 2 sprays into both nostrils daily., Disp: 16 g, Rfl: 6 .  gabapentin (NEURONTIN) 600 MG tablet, Take one AM, one after lunch, and 2 QHS, Disp: 360 tablet, Rfl: 3 .  HYDROcodone-acetaminophen (NORCO) 7.5-325 MG tablet, Take 1 tablet by mouth every 4 (four) hours as needed for moderate pain., Disp: 30 tablet, Rfl: 0 .  metoprolol succinate (TOPROL-XL) 50 MG 24 hr tablet, Take 1 tablet (50 mg total) by mouth at bedtime. Take with or immediately following a meal., Disp: 90 tablet, Rfl: 1 .  montelukast (SINGULAIR) 10 MG tablet, Take 1 tablet (10 mg total) by mouth at bedtime., Disp: 30 tablet, Rfl: 3 .  ofloxacin (OCUFLOX) 0.3 % ophthalmic solution, INSTILL 1 DROP INTO RIGHT EYE TWICE A DAY FOR 5 DAYS, Disp: , Rfl:  .  pantoprazole (PROTONIX) 40 MG tablet, Take one tablet by mouth daily., Disp: 30 tablet, Rfl: 3 .  prednisoLONE acetate (PRED FORTE) 1 % ophthalmic suspension, 1 DROP INTO BOTH EYES 4 TIMES A DAY X7 DAYS, THEN  TWICE DAILY X14 DAYS, THEN ONCE DAILY X14 DAYS, Disp: , Rfl:  .  rosuvastatin (CRESTOR) 10 MG tablet, Take 1 tablet (10 mg total) by mouth daily., Disp: 90 tablet, Rfl: 1 .  spironolactone (ALDACTONE) 50 MG tablet, Take 50 mg by mouth daily., Disp: , Rfl: 0 .  tiZANidine (ZANAFLEX) 4 MG tablet, Take 1 tablet po BID prn muscle pain/spasms., Disp: 45 tablet, Rfl: 3  Allergies  Allergen Reactions  . Flagyl [Metronidazole] Anaphylaxis and Rash  . Penicillins Anaphylaxis and Other (See Comments)    Has patient had a PCN reaction causing immediate rash, facial/tongue/throat swelling, SOB or lightheadedness with hypotension:  Yes Has patient had a PCN reaction causing severe rash involving mucus membranes or skin necrosis: No Has patient had a PCN reaction that required hospitalization No Has patient had a PCN reaction occurring within the last 10 years: No If all of the above answers are "NO", then may proceed with Cephalosporin use.  Marland Kitchen Doxycycline Hives   Review of Systems Objective:  There were no vitals filed for this visit.  General: Well developed, nourished, in no acute distress, alert and oriented x3   Dermatological: Skin is warm, dry and supple bilateral. Nails x 10 are well maintained; remaining integument appears unremarkable at this time. There are no open sores, no preulcerative lesions, no rash or signs of infection present.  Vascular: Dorsalis Pedis artery and Posterior Tibial artery pedal pulses are 2/4 bilateral with immedate capillary fill time. Pedal hair growth present. No varicosities and no lower extremity edema present bilateral.   Neruologic: Grossly intact via light touch bilateral. Vibratory intact via tuning fork bilateral. Protective threshold with Semmes Wienstein monofilament intact to all pedal sites bilateral. Patellar and Achilles deep tendon reflexes 2+ bilateral. No Babinski or clonus noted bilateral.   Musculoskeletal: No gross boney pedal deformities bilateral. No pain, crepitus, or limitation noted with foot and ankle range of motion bilateral. Muscular strength 5/5 in all groups tested bilateral.  Gait: Unassisted, Nonantalgic.    Radiographs:  None taken  Assessment & Plan:   Assessment: Severe diabetic peripheral neuropathy progressing  Plan: Discussed etiology pathology conservative surgical therapies.  At this point were going to start her with gait and balance training, we will increase her gabapentin to 1200 mg at nighttime 600 in the morning and 600 at lunch we made the change in the prescriptions.  Also she is going to follow-up with [facility] for orthotics or  diabetic type shoes.  We did discuss the possibility of spinal cord stimulators.  And we may need to consider vascular evaluation.  I will see her in a month     Marvelyn Bouchillon T. [place], [place]

## 2019-11-10 ENCOUNTER — Other Ambulatory Visit: Payer: Self-pay

## 2019-11-10 MED ORDER — PANTOPRAZOLE SODIUM 40 MG PO TBEC: DELAYED_RELEASE_TABLET | ORAL | 3 refills | Status: DC

## 2019-11-15 ENCOUNTER — Other Ambulatory Visit: Payer: BC Managed Care – PPO | Admitting: Orthotics

## 2019-11-15 ENCOUNTER — Other Ambulatory Visit: Payer: Self-pay

## 2019-11-15 DIAGNOSIS — E782 Mixed hyperlipidemia: Secondary | ICD-10-CM

## 2019-11-15 MED ORDER — ROSUVASTATIN CALCIUM 10 MG PO TABS: 10.0000 mg | ORAL_TABLET | Freq: Every day | ORAL | 1 refills | Status: DC

## 2019-11-16 ENCOUNTER — Other Ambulatory Visit: Payer: Self-pay

## 2019-11-16 ENCOUNTER — Ambulatory Visit (INDEPENDENT_AMBULATORY_CARE_PROVIDER_SITE_OTHER): Payer: BC Managed Care – PPO | Admitting: Adult Health

## 2019-11-16 ENCOUNTER — Encounter: Payer: Self-pay | Admitting: Adult Health

## 2019-11-16 VITALS — BP 143/59 | HR 73 | Temp 97.5°F | Resp 16 | Ht 64.0 in | Wt 196.0 lb

## 2019-11-16 DIAGNOSIS — M5412 Radiculopathy, cervical region: Secondary | ICD-10-CM | POA: Diagnosis not present

## 2019-11-16 DIAGNOSIS — G43009 Migraine without aura, not intractable, without status migrainosus: Secondary | ICD-10-CM | POA: Diagnosis not present

## 2019-11-16 MED ORDER — BUTALBITAL-APAP-CAFFEINE 50-325-40 MG PO TABS: 1.0000 | ORAL_TABLET | Freq: Four times a day (QID) | ORAL | 0 refills | Status: DC | PRN

## 2019-11-16 NOTE — Progress Notes (Signed)
[facility] [place], [place] [address]  Internal MEDICINE  Office Visit Note  Patient Name: Wendy Hubbard  [id number]  [id number]  Date of Service: 11/21/2019  Chief Complaint  Patient presents with  . Acute Visit    left shoulder pain, taking muscle spasam meds but only works for a few hours  . Headache    for 7 days   . Medication Refill     HPI Pt is here for a sick visit. She reports ongoing issues with shoulder pain, from bone spurs in her neck.  For the last two days she has been having more severe symptoms.  She also has a headache across her forehead for the last 7 days.       Current Medication:  Outpatient Encounter Medications as of 11/16/2019  Medication Sig Note  . ALPRAZolam (XANAX) 0.5 MG tablet Take 1 tablet (0.5 mg total) by mouth at bedtime as needed for anxiety.   . cetirizine (ZYRTEC) 10 MG tablet Take 10 mg by mouth daily.   Marland Kitchen dicyclomine (BENTYL) 10 MG capsule Take 1 capsule (10 mg total) by mouth 4 (four) times daily -  before meals and at bedtime.   . diphenoxylate-atropine (LOMOTIL) 2.5-0.025 MG tablet Take 1 tablet by mouth 4 (four) times daily as needed for diarrhea or loose stools.   Marland Kitchen escitalopram (LEXAPRO) 10 MG tablet Take 1 tablet (10 mg total) by mouth every evening.   . fluticasone (FLONASE) 50 MCG/ACT nasal spray Place 2 sprays into both nostrils daily.   Marland Kitchen gabapentin (NEURONTIN) 600 MG tablet Take one AM, one after lunch, and 2 QHS   . HYDROcodone-acetaminophen (NORCO) 7.5-325 MG tablet Take 1 tablet by mouth every 4 (four) hours as needed for moderate pain.   . metoprolol succinate (TOPROL-XL) 50 MG 24 hr tablet Take 1 tablet (50 mg total) by mouth at bedtime. Take with or immediately following a meal.   . montelukast (SINGULAIR) 10 MG tablet Take 1 tablet (10 mg total) by mouth at bedtime.   Marland Kitchen ofloxacin (OCUFLOX) 0.3 % ophthalmic solution INSTILL 1 DROP INTO RIGHT EYE TWICE A DAY FOR 5 DAYS   . pantoprazole  (PROTONIX) 40 MG tablet Take one tablet by mouth daily.   . prednisoLONE acetate (PRED FORTE) 1 % ophthalmic suspension 1 DROP INTO BOTH EYES 4 TIMES A DAY X7 DAYS, THEN TWICE DAILY X14 DAYS, THEN ONCE DAILY X14 DAYS   . rosuvastatin (CRESTOR) 10 MG tablet Take 1 tablet (10 mg total) by mouth daily.   Marland Kitchen spironolactone (ALDACTONE) 50 MG tablet Take 50 mg by mouth daily. 08/22/2014: .   Marland Kitchen tiZANidine (ZANAFLEX) 4 MG tablet Take 1 tablet po BID prn muscle pain/spasms.   . butalbital-acetaminophen-caffeine (FIORICET) 50-325-40 MG tablet Take 1-2 tablets by mouth every 6 (six) hours as needed for headache.    No facility-administered encounter medications on file as of 11/16/2019.      Medical History: Past Medical History:  Diagnosis Date  . COPD (chronic obstructive pulmonary disease) ([place])   . GERD (gastroesophageal reflux disease)   . Hyperlipidemia   . Hypertension   . Rapid heart rate   . Sleep apnea      Vital Signs: BP (!) 143/59   Pulse 73   Temp (!) 97.5 F (36.4 C)   Resp 16   Ht 5\' 4"  (1.626 m)   Wt 196 lb (88.9 kg)   SpO2 96%   BMI 33.64 kg/m    Review  of Systems  Constitutional: Negative for chills, fatigue and unexpected weight change.  HENT: Negative for congestion, rhinorrhea, sneezing and sore throat.   Eyes: Negative for photophobia, pain and redness.  Respiratory: Negative for cough, chest tightness and shortness of breath.   Cardiovascular: Negative for chest pain and palpitations.  Gastrointestinal: Negative for abdominal pain, constipation, diarrhea, nausea and vomiting.  Endocrine: Negative.   Genitourinary: Negative for dysuria and frequency.  Musculoskeletal: Negative for arthralgias, back pain, joint swelling and neck pain.  Skin: Negative for rash.  Allergic/Immunologic: Negative.   Neurological: Negative for tremors and numbness.  Hematological: Negative for adenopathy. Does not bruise/bleed easily.  Psychiatric/Behavioral: Negative for  behavioral problems and sleep disturbance. The patient is not nervous/anxious.     Physical Exam Vitals and nursing note reviewed.  Constitutional:      General: She is not in acute distress.    Appearance: She is well-developed. She is not diaphoretic.  HENT:     Head: Normocephalic and atraumatic.     Mouth/Throat:     Pharynx: No oropharyngeal exudate.  Eyes:     Pupils: Pupils are equal, round, and reactive to light.  Neck:     Thyroid: No thyromegaly.     Vascular: No JVD.     Trachea: No tracheal deviation.  Cardiovascular:     Rate and Rhythm: Normal rate and regular rhythm.     Heart sounds: Normal heart sounds. No murmur heard.  No friction rub. No gallop.   Pulmonary:     Effort: Pulmonary effort is normal. No respiratory distress.     Breath sounds: Normal breath sounds. No wheezing or rales.  Chest:     Chest wall: No tenderness.  Abdominal:     Palpations: Abdomen is soft.     Tenderness: There is no abdominal tenderness. There is no guarding.  Musculoskeletal:        General: Normal range of motion.     Cervical back: Normal range of motion and neck supple.  Lymphadenopathy:     Cervical: No cervical adenopathy.  Skin:    General: Skin is warm and dry.  Neurological:     Mental Status: She is alert and oriented to person, place, and time.     Cranial Nerves: No cranial nerve deficit.  Psychiatric:        Behavior: Behavior normal.        Thought Content: Thought content normal.        Judgment: Judgment normal.    Assessment/Plan: 1. Migraine without aura and without status migrainosus, not intractable Controlled, continue to use fioricet prn.   - butalbital-acetaminophen-caffeine (FIORICET) 50-325-40 MG tablet; Take 1-2 tablets by mouth every 6 (six) hours as needed for headache.  Dispense: 20 tablet; Refill: 0  2. Cervical radiculopathy History of cervical bone spurs, would like to discuss options for surgery/treatement.  - Ambulatory referral to  Orthopedic Surgery  General Counseling: [name] verbalizes understanding of the findings of todays visit and agrees with plan of treatment. I have discussed any further diagnostic evaluation that may be needed or ordered today. We also reviewed her medications today. she has been encouraged to call the office with any questions or concerns that should arise related to todays visit.   Orders Placed This Encounter  Procedures  . Ambulatory referral to Orthopedic Surgery    Meds ordered this encounter  Medications  . butalbital-acetaminophen-caffeine (FIORICET) 50-325-40 MG tablet    Sig: Take 1-2 tablets by mouth every 6 (six) hours  as needed for headache.    Dispense:  20 tablet    Refill:  0    Time spent: 30 Minutes  This patient was seen by Orson Gear AGNP-C in Collaboration with Dr Lavera Guise as a part of collaborative care agreement.  Kendell Bane AGNP-C Internal Medicine

## 2019-11-22 ENCOUNTER — Telehealth: Payer: Self-pay

## 2019-11-22 NOTE — Telephone Encounter (Signed)
Lmom to confirm and screen for 11-24-19 ov.

## 2019-11-24 ENCOUNTER — Encounter: Payer: BC Managed Care – PPO | Admitting: Nurse Practitioner

## 2019-11-30 DIAGNOSIS — M542 Cervicalgia: Secondary | ICD-10-CM | POA: Diagnosis not present

## 2019-12-01 ENCOUNTER — Other Ambulatory Visit: Payer: Self-pay | Admitting: Nurse Practitioner

## 2019-12-01 ENCOUNTER — Other Ambulatory Visit: Payer: Self-pay

## 2019-12-01 DIAGNOSIS — M542 Cervicalgia: Secondary | ICD-10-CM

## 2019-12-01 DIAGNOSIS — F411 Generalized anxiety disorder: Secondary | ICD-10-CM

## 2019-12-04 ENCOUNTER — Telehealth: Payer: Self-pay

## 2019-12-04 ENCOUNTER — Other Ambulatory Visit: Payer: Self-pay | Admitting: Nurse Practitioner

## 2019-12-04 DIAGNOSIS — F411 Generalized anxiety disorder: Secondary | ICD-10-CM

## 2019-12-04 MED ORDER — ALPRAZOLAM 0.5 MG PO TABS: 0.5000 mg | ORAL_TABLET | Freq: Every evening | ORAL | 3 refills | Status: DC | PRN

## 2019-12-04 NOTE — Telephone Encounter (Signed)
Done. New rx sent to [facility].

## 2019-12-06 ENCOUNTER — Other Ambulatory Visit: Payer: Self-pay | Admitting: Nurse Practitioner

## 2019-12-06 DIAGNOSIS — G8929 Other chronic pain: Secondary | ICD-10-CM

## 2019-12-06 DIAGNOSIS — M25512 Pain in left shoulder: Secondary | ICD-10-CM

## 2019-12-07 ENCOUNTER — Other Ambulatory Visit: Payer: Self-pay

## 2019-12-07 DIAGNOSIS — M792 Neuralgia and neuritis, unspecified: Secondary | ICD-10-CM

## 2019-12-07 DIAGNOSIS — K580 Irritable bowel syndrome with diarrhea: Secondary | ICD-10-CM

## 2019-12-08 MED ORDER — DIPHENOXYLATE-ATROPINE 2.5-0.025 MG PO TABS: 1.0000 | ORAL_TABLET | Freq: Four times a day (QID) | ORAL | 1 refills | Status: DC | PRN

## 2019-12-08 MED ORDER — HYDROCODONE-ACETAMINOPHEN 7.5-325 MG PO TABS: 1.0000 | ORAL_TABLET | ORAL | 0 refills | Status: DC | PRN

## 2019-12-13 ENCOUNTER — Other Ambulatory Visit: Payer: Self-pay

## 2019-12-13 ENCOUNTER — Ambulatory Visit
Admission: RE | Admit: 2019-12-13 | Discharge: 2019-12-13 | Disposition: A | Discharge status: Home / Self Care | Payer: BC Managed Care – PPO | Source: Ambulatory Visit | Attending: Nurse Practitioner | Admitting: Nurse Practitioner

## 2019-12-13 ENCOUNTER — Other Ambulatory Visit: Payer: BC Managed Care – PPO | Admitting: Orthotics

## 2019-12-13 DIAGNOSIS — M25412 Effusion, left shoulder: Secondary | ICD-10-CM | POA: Diagnosis not present

## 2019-12-13 DIAGNOSIS — M19012 Primary osteoarthritis, left shoulder: Secondary | ICD-10-CM | POA: Diagnosis not present

## 2019-12-13 DIAGNOSIS — G8929 Other chronic pain: Secondary | ICD-10-CM | POA: Insufficient documentation

## 2019-12-13 DIAGNOSIS — M25512 Pain in left shoulder: Secondary | ICD-10-CM | POA: Diagnosis not present

## 2019-12-13 DIAGNOSIS — M7552 Bursitis of left shoulder: Secondary | ICD-10-CM | POA: Diagnosis not present

## 2019-12-13 DIAGNOSIS — M542 Cervicalgia: Secondary | ICD-10-CM | POA: Diagnosis not present

## 2019-12-13 DIAGNOSIS — R531 Weakness: Secondary | ICD-10-CM | POA: Diagnosis not present

## 2019-12-14 ENCOUNTER — Ambulatory Visit: Payer: BC Managed Care – PPO | Admitting: Adult Health

## 2019-12-14 ENCOUNTER — Encounter: Payer: Self-pay | Admitting: Adult Health

## 2019-12-14 ENCOUNTER — Other Ambulatory Visit: Payer: Self-pay

## 2019-12-14 VITALS — BP 135/58 | HR 61 | Temp 97.4°F | Resp 16 | Ht 64.0 in | Wt 193.8 lb

## 2019-12-14 DIAGNOSIS — B029 Zoster without complications: Secondary | ICD-10-CM

## 2019-12-14 MED ORDER — VALACYCLOVIR HCL 1 G PO TABS: 1000.0000 mg | ORAL_TABLET | Freq: Three times a day (TID) | ORAL | 0 refills | Status: DC

## 2019-12-14 NOTE — Progress Notes (Signed)
[facility] [place], [place] [address]  Internal MEDICINE  Office Visit Note  Patient Name: Wendy Hubbard  [id number]  [id number]  Date of Service: 12/14/2019  Chief Complaint  Patient presents with  . Acute Visit    POSS Shingles     HPI Pt is here for a sick visit. She reports 4 days ago she noticed a red blister on her abdomen.  It began to spread along the dermatome to her right side.  She has not had shingles in the past.  She reports it feels like an internal burning along this rash.  She has some mild itching. There are some small blisters.      Current Medication:  Outpatient Encounter Medications as of 12/14/2019  Medication Sig Note  . ALPRAZolam (XANAX) 0.5 MG tablet Take 1 tablet (0.5 mg total) by mouth at bedtime as needed for anxiety.   . butalbital-acetaminophen-caffeine (FIORICET) 50-325-40 MG tablet Take 1-2 tablets by mouth every 6 (six) hours as needed for headache.   . cetirizine (ZYRTEC) 10 MG tablet Take 10 mg by mouth daily.   Marland Kitchen dicyclomine (BENTYL) 10 MG capsule Take 1 capsule (10 mg total) by mouth 4 (four) times daily -  before meals and at bedtime.   . diphenoxylate-atropine (LOMOTIL) 2.5-0.025 MG tablet Take 1 tablet by mouth 4 (four) times daily as needed for diarrhea or loose stools.   Marland Kitchen escitalopram (LEXAPRO) 10 MG tablet Take 1 tablet (10 mg total) by mouth every evening.   . fluticasone (FLONASE) 50 MCG/ACT nasal spray Place 2 sprays into both nostrils daily.   Marland Kitchen gabapentin (NEURONTIN) 600 MG tablet Take one AM, one after lunch, and 2 QHS   . HYDROcodone-acetaminophen (NORCO) 7.5-325 MG tablet Take 1 tablet by mouth every 4 (four) hours as needed for moderate pain.   . metoprolol succinate (TOPROL-XL) 50 MG 24 hr tablet Take 1 tablet (50 mg total) by mouth at bedtime. Take with or immediately following a meal.   . montelukast (SINGULAIR) 10 MG tablet Take 1 tablet (10 mg total) by mouth at bedtime.   Marland Kitchen ofloxacin (OCUFLOX) 0.3  % ophthalmic solution INSTILL 1 DROP INTO RIGHT EYE TWICE A DAY FOR 5 DAYS   . pantoprazole (PROTONIX) 40 MG tablet Take one tablet by mouth daily.   . prednisoLONE acetate (PRED FORTE) 1 % ophthalmic suspension 1 DROP INTO BOTH EYES 4 TIMES A DAY X7 DAYS, THEN TWICE DAILY X14 DAYS, THEN ONCE DAILY X14 DAYS   . rosuvastatin (CRESTOR) 10 MG tablet Take 1 tablet (10 mg total) by mouth daily.   Marland Kitchen spironolactone (ALDACTONE) 50 MG tablet Take 50 mg by mouth daily. 08/22/2014: .   Marland Kitchen tiZANidine (ZANAFLEX) 4 MG tablet Take 1 tablet po BID prn muscle pain/spasms.   . valACYclovir (VALTREX) 1000 MG tablet Take 1 tablet (1,000 mg total) by mouth 3 (three) times daily.    No facility-administered encounter medications on file as of 12/14/2019.      Medical History: Past Medical History:  Diagnosis Date  . COPD (chronic obstructive pulmonary disease) ([place])   . GERD (gastroesophageal reflux disease)   . Hyperlipidemia   . Hypertension   . Rapid heart rate   . Sleep apnea      Vital Signs: BP (!) 135/58   Pulse 61   Temp (!) 97.4 F (36.3 C)   Resp 16   Ht 5\' 4"  (1.626 m)   Wt 193 lb 12.8 oz (87.9 kg)  SpO2 98%   BMI 33.27 kg/m    Review of Systems  Constitutional: Negative for chills, fatigue and unexpected weight change.  HENT: Negative for congestion, rhinorrhea, sneezing and sore throat.   Eyes: Negative for photophobia, pain and redness.  Respiratory: Negative for cough and shortness of breath.   Cardiovascular: Negative for chest pain and palpitations.  Gastrointestinal: Negative for abdominal pain, constipation, diarrhea, nausea and vomiting.  Endocrine: Negative.   Genitourinary: Negative for dysuria and frequency.  Musculoskeletal: Negative for arthralgias, back pain, joint swelling and neck pain.  Skin: Positive for rash.  Allergic/Immunologic: Negative.   Neurological: Negative for tremors and numbness.  Hematological: Negative for adenopathy. Does not bruise/bleed  easily.  Psychiatric/Behavioral: Negative for behavioral problems and sleep disturbance. The patient is not nervous/anxious.     Physical Exam Vitals and nursing note reviewed.  Constitutional:      General: She is not in acute distress.    Appearance: She is well-developed. She is not diaphoretic.  HENT:     Head: Normocephalic and atraumatic.     Mouth/Throat:     Pharynx: No oropharyngeal exudate.  Eyes:     Pupils: Pupils are equal, round, and reactive to light.  Neck:     Thyroid: No thyromegaly.     Vascular: No JVD.     Trachea: No tracheal deviation.  Cardiovascular:     Rate and Rhythm: Normal rate and regular rhythm.     Heart sounds: Normal heart sounds. No murmur heard.  No friction rub. No gallop.   Pulmonary:     Effort: Pulmonary effort is normal. No respiratory distress.     Breath sounds: Normal breath sounds. No wheezing or rales.  Chest:     Chest wall: No tenderness.  Abdominal:     Palpations: Abdomen is soft.     Tenderness: There is no abdominal tenderness. There is no guarding.  Musculoskeletal:        General: Normal range of motion.     Cervical back: Normal range of motion and neck supple.  Lymphadenopathy:     Cervical: No cervical adenopathy.  Skin:    General: Skin is warm and dry.  Neurological:     Mental Status: She is alert and oriented to person, place, and time.     Cranial Nerves: No cranial nerve deficit.  Psychiatric:        Behavior: Behavior normal.        Thought Content: Thought content normal.        Judgment: Judgment normal.    Assessment/Plan: 1. Herpes zoster without complication Use Valtrex as discussed.  Follow up in 7-10 days if no better.  - valACYclovir (VALTREX) 1000 MG tablet; Take 1 tablet (1,000 mg total) by mouth 3 (three) times daily.  Dispense: 20 tablet; Refill: 0  General Counseling: Lavana verbalizes understanding of the findings of todays visit and agrees with plan of treatment. I have discussed any  further diagnostic evaluation that may be needed or ordered today. We also reviewed her medications today. she has been encouraged to call the office with any questions or concerns that should arise related to todays visit.   No orders of the defined types were placed in this encounter.   Meds ordered this encounter  Medications  . valACYclovir (VALTREX) 1000 MG tablet    Sig: Take 1 tablet (1,000 mg total) by mouth 3 (three) times daily.    Dispense:  20 tablet    Refill:  0  Time spent: 25 Minutes  This patient was seen by Orson Gear AGNP-C in Collaboration with Dr Lavera Guise as a part of collaborative care agreement.  Kendell Bane AGNP-C Internal Medicine

## 2019-12-15 ENCOUNTER — Encounter: Payer: BC Managed Care – PPO | Admitting: Nurse Practitioner

## 2019-12-18 ENCOUNTER — Other Ambulatory Visit: Payer: Self-pay

## 2019-12-18 DIAGNOSIS — J301 Allergic rhinitis due to pollen: Secondary | ICD-10-CM

## 2019-12-18 MED ORDER — MONTELUKAST SODIUM 10 MG PO TABS: 10.0000 mg | ORAL_TABLET | Freq: Every day | ORAL | 3 refills | Status: DC

## 2019-12-20 ENCOUNTER — Other Ambulatory Visit: Payer: Self-pay

## 2019-12-20 MED ORDER — METOPROLOL SUCCINATE ER 50 MG PO TB24: 50.0000 mg | ORAL_TABLET | Freq: Every day | ORAL | 1 refills | Status: DC

## 2019-12-21 DIAGNOSIS — M5412 Radiculopathy, cervical region: Secondary | ICD-10-CM | POA: Diagnosis not present

## 2019-12-21 DIAGNOSIS — R519 Headache, unspecified: Secondary | ICD-10-CM | POA: Diagnosis not present

## 2019-12-21 DIAGNOSIS — M25512 Pain in left shoulder: Secondary | ICD-10-CM | POA: Diagnosis not present

## 2019-12-21 DIAGNOSIS — G8929 Other chronic pain: Secondary | ICD-10-CM | POA: Diagnosis not present

## 2019-12-22 ENCOUNTER — Telehealth: Payer: Self-pay

## 2019-12-22 ENCOUNTER — Other Ambulatory Visit: Payer: Self-pay | Admitting: Adult Health

## 2019-12-22 ENCOUNTER — Other Ambulatory Visit: Payer: Self-pay

## 2019-12-22 ENCOUNTER — Other Ambulatory Visit: Payer: Self-pay | Admitting: Nurse Practitioner

## 2019-12-22 DIAGNOSIS — G43009 Migraine without aura, not intractable, without status migrainosus: Secondary | ICD-10-CM

## 2019-12-22 MED ORDER — BUTALBITAL-APAP-CAFFEINE 50-325-40 MG PO TABS: 1.0000 | ORAL_TABLET | Freq: Four times a day (QID) | ORAL | 0 refills | Status: DC | PRN

## 2019-12-22 MED ORDER — BUTALBITAL-APAP-CAFFEINE 50-325-40 MG PO TABS: 1.0000 | ORAL_TABLET | Freq: Four times a day (QID) | ORAL | 2 refills | Status: DC | PRN

## 2019-12-22 NOTE — Telephone Encounter (Signed)
Sent her headache medicine to [facility] in [place].

## 2020-01-01 ENCOUNTER — Encounter: Payer: Self-pay | Admitting: Adult Health

## 2020-01-01 ENCOUNTER — Telehealth: Payer: Self-pay

## 2020-01-01 ENCOUNTER — Ambulatory Visit: Payer: BC Managed Care – PPO | Admitting: Adult Health

## 2020-01-01 VITALS — Temp 99.0°F | Resp 16 | Ht 64.0 in | Wt 193.0 lb

## 2020-01-01 DIAGNOSIS — R05 Cough: Secondary | ICD-10-CM | POA: Diagnosis not present

## 2020-01-01 DIAGNOSIS — J988 Other specified respiratory disorders: Secondary | ICD-10-CM | POA: Diagnosis not present

## 2020-01-01 DIAGNOSIS — R059 Cough, unspecified: Secondary | ICD-10-CM

## 2020-01-01 MED ORDER — LEVOFLOXACIN 500 MG PO TABS: 500.0000 mg | ORAL_TABLET | Freq: Every day | ORAL | 0 refills | Status: DC

## 2020-01-01 MED ORDER — HYDROCOD POLST-CPM POLST ER 10-8 MG/5ML PO SUER: 5.0000 mL | Freq: Two times a day (BID) | ORAL | 0 refills | Status: DC | PRN

## 2020-01-01 NOTE — Telephone Encounter (Signed)
Pt called that she had sore throat,coughing,headche and low grade fever she already make telephone visit advised pt if her symptoms go worse need to go to ED

## 2020-01-01 NOTE — Progress Notes (Signed)
[facility] [place], [place] [address]  Internal MEDICINE  Telephone Visit  Patient Name: Wendy Hubbard  [id number]  [id number]  Date of Service: 01/01/2020  I connected with the patient at 157 by telephone and verified the patients identity using two identifiers.   I discussed the limitations, risks, security and privacy concerns of performing an evaluation and management service by telephone and the availability of in person appointments. I also discussed with the patient that there may be a patient responsible charge related to the service.  The patient expressed understanding and agrees to proceed.    Chief Complaint  Patient presents with  . Sore Throat    cant swallow, red swollen, says glands are swollen since sat 12/30/19  . Nasal Congestion    head and chest  . Fever    low grade  . watery eyes  . Telephone Screen  . Telephone Assessment    HPI  Pt is seen via telephone.  She reports she has been having head and chest congestion, headache, sore throat, low grade fever and headache.  She has not been around anyone with covid that she is aware of.  She had both of her vaccines earlier this year.     Current Medication: Outpatient Encounter Medications as of 01/01/2020  Medication Sig Note  . ALPRAZolam (XANAX) 0.5 MG tablet Take 1 tablet (0.5 mg total) by mouth at bedtime as needed for anxiety.   . butalbital-acetaminophen-caffeine (FIORICET) 50-325-40 MG tablet Take 1-2 tablets by mouth every 6 (six) hours as needed for headache.   . cetirizine (ZYRTEC) 10 MG tablet Take 10 mg by mouth daily.   Marland Kitchen dicyclomine (BENTYL) 10 MG capsule Take 1 capsule (10 mg total) by mouth 4 (four) times daily -  before meals and at bedtime.   . diphenoxylate-atropine (LOMOTIL) 2.5-0.025 MG tablet Take 1 tablet by mouth 4 (four) times daily as needed for diarrhea or loose stools.   Marland Kitchen escitalopram (LEXAPRO) 10 MG tablet Take 1 tablet (10 mg total) by mouth every  evening.   . fluticasone (FLONASE) 50 MCG/ACT nasal spray Place 2 sprays into both nostrils daily.   Marland Kitchen gabapentin (NEURONTIN) 600 MG tablet Take one AM, one after lunch, and 2 QHS   . HYDROcodone-acetaminophen (NORCO) 7.5-325 MG tablet Take 1 tablet by mouth every 4 (four) hours as needed for moderate pain.   . metoprolol succinate (TOPROL-XL) 50 MG 24 hr tablet Take 1 tablet (50 mg total) by mouth at bedtime. Take with or immediately following a meal.   . montelukast (SINGULAIR) 10 MG tablet Take 1 tablet (10 mg total) by mouth at bedtime.   Marland Kitchen ofloxacin (OCUFLOX) 0.3 % ophthalmic solution INSTILL 1 DROP INTO RIGHT EYE TWICE A DAY FOR 5 DAYS   . pantoprazole (PROTONIX) 40 MG tablet Take one tablet by mouth daily.   . prednisoLONE acetate (PRED FORTE) 1 % ophthalmic suspension 1 DROP INTO BOTH EYES 4 TIMES A DAY X7 DAYS, THEN TWICE DAILY X14 DAYS, THEN ONCE DAILY X14 DAYS   . rosuvastatin (CRESTOR) 10 MG tablet Take 1 tablet (10 mg total) by mouth daily.   Marland Kitchen spironolactone (ALDACTONE) 50 MG tablet Take 50 mg by mouth daily. 08/22/2014: .   Marland Kitchen tiZANidine (ZANAFLEX) 4 MG tablet Take 1 tablet po BID prn muscle pain/spasms.   . valACYclovir (VALTREX) 1000 MG tablet Take 1 tablet (1,000 mg total) by mouth 3 (three) times daily.   . chlorpheniramine-HYDROcodone (TUSSIONEX PENNKINETIC ER) 10-8  MG/5ML SUER Take 5 mLs by mouth every 12 (twelve) hours as needed for cough.   Marland Kitchen levofloxacin (LEVAQUIN) 500 MG tablet Take 1 tablet (500 mg total) by mouth daily.    No facility-administered encounter medications on file as of 01/01/2020.    Surgical History: Past Surgical History:  Procedure Laterality Date  . ABDOMINAL HYSTERECTOMY    . ANKLE ARTHROSCOPY Bilateral   . APPENDECTOMY    . BLADDER SUSPENSION    . BREAST EXCISIONAL BIOPSY Left 1990   neg  . BREAST EXCISIONAL BIOPSY Right 1989   neg  . BREAST SURGERY    . CHOLECYSTECTOMY    . COLONOSCOPY N/A 11/04/2015   Procedure: COLONOSCOPY;  Surgeon:  Manya Silvas, MD;  Location: [facility] ENDOSCOPY;  Service: Endoscopy;  Laterality: N/A;  . ESOPHAGOGASTRODUODENOSCOPY N/A 11/02/2015   Procedure: ESOPHAGOGASTRODUODENOSCOPY (EGD);  Surgeon: Manya Silvas, MD;  Location: [facility] ENDOSCOPY;  Service: Endoscopy;  Laterality: N/A;  . KNEE ARTHROSCOPY Left   . REDUCTION MAMMAPLASTY Bilateral 1988  . TONSILLECTOMY      Medical History: Past Medical History:  Diagnosis Date  . COPD (chronic obstructive pulmonary disease) ([place])   . GERD (gastroesophageal reflux disease)   . Hyperlipidemia   . Hypertension   . Rapid heart rate   . Sleep apnea     Family History: Family History  Problem Relation Age of Onset  . Hypertension Other   . Bladder Cancer Mother   . Kidney cancer Neg Hx   . Prostate cancer Neg Hx   . Breast cancer Neg Hx     Social History   Socioeconomic History  . Marital status: Married    Spouse name: Not on file  . Number of children: Not on file  . Years of education: Not on file  . Highest education level: Not on file  Occupational History  . Not on file  Tobacco Use  . Smoking status: Former Research scientist (life sciences)  . Smokeless tobacco: Never Used  . Tobacco comment: quit in 1996  Vaping Use  . Vaping Use: Never used  Substance and Sexual Activity  . Alcohol use: Not Currently    Alcohol/week: 0.0 standard drinks    Comment: occasional  . Drug use: No  . Sexual activity: Not on file  Other Topics Concern  . Not on file  Social History Narrative  . Not on file   Social Determinants of Health   Financial Resource Strain:   . Difficulty of Paying Living Expenses: Not on file  Food Insecurity:   . Worried About Charity fundraiser in the Last Year: Not on file  . Ran Out of Food in the Last Year: Not on file  Transportation Needs:   . Lack of Transportation (Medical): Not on file  . Lack of Transportation (Non-Medical): Not on file  Physical Activity:   . Days of Exercise per Week: Not on file  . Minutes of  Exercise per Session: Not on file  Stress:   . Feeling of Stress : Not on file  Social Connections:   . Frequency of Communication with Friends and Family: Not on file  . Frequency of Social Gatherings with Friends and Family: Not on file  . Attends Religious Services: Not on file  . Active Member of Clubs or Organizations: Not on file  . Attends Archivist Meetings: Not on file  . Marital Status: Not on file  Intimate Partner Violence:   . Fear of Current or Ex-Partner: Not on  file  . Emotionally Abused: Not on file  . Physically Abused: Not on file  . Sexually Abused: Not on file      Review of Systems  Constitutional: Negative for chills, fatigue and unexpected weight change.  HENT: Negative for congestion, rhinorrhea, sneezing and sore throat.   Eyes: Negative for photophobia, pain and redness.  Respiratory: Negative for cough, chest tightness and shortness of breath.   Cardiovascular: Negative for chest pain and palpitations.  Gastrointestinal: Negative for abdominal pain, constipation, diarrhea, nausea and vomiting.  Endocrine: Negative.   Genitourinary: Negative for dysuria and frequency.  Musculoskeletal: Negative for arthralgias, back pain, joint swelling and neck pain.  Skin: Negative for rash.  Allergic/Immunologic: Negative.   Neurological: Negative for tremors and numbness.  Hematological: Negative for adenopathy. Does not bruise/bleed easily.  Psychiatric/Behavioral: Negative for behavioral problems and sleep disturbance. The patient is not nervous/anxious.     Vital Signs: Temp 99 F (37.2 C)   Resp 16   Ht 5\' 4"  (1.626 m)   Wt 193 lb (87.5 kg)   BMI 33.13 kg/m    Observation/Objective:  Ill sounding, NAD noted   Assessment/Plan: 1. Respiratory infection Advised patient to take entire course of antibiotics as prescribed with food. Pt should return to clinic in 7-10 days if symptoms fail to improve or new symptoms develop.  - levofloxacin  (LEVAQUIN) 500 MG tablet; Take 1 tablet (500 mg total) by mouth daily.  Dispense: 7 tablet; Refill: 0 - Novel Coronavirus, NAA ([facility])  2. Cough Reviewed risks and possible side effects associated with taking opiates, benzodiazepines and other CNS depressants. Combination of these could cause dizziness and drowsiness. Advised patient not to drive or operate machinery when taking these medications, as patient's and other's life can be at risk and will have consequences. Patient verbalized understanding in this matter. Dependence and abuse for these drugs will be monitored closely. A Controlled substance policy and procedure is on file which allows [facility] to order a urine drug screen test at any visit. Patient understands and agrees with the plan - chlorpheniramine-HYDROcodone (TUSSIONEX PENNKINETIC ER) 10-8 MG/5ML SUER; Take 5 mLs by mouth every 12 (twelve) hours as needed for cough.  Dispense: 70 mL; Refill: 0  General Counseling: Johnye verbalizes understanding of the findings of today's phone visit and agrees with plan of treatment. I have discussed any further diagnostic evaluation that may be needed or ordered today. We also reviewed her medications today. she has been encouraged to call the office with any questions or concerns that should arise related to todays visit.    Orders Placed This Encounter  Procedures  . Novel Coronavirus, NAA ([facility])    Meds ordered this encounter  Medications  . levofloxacin (LEVAQUIN) 500 MG tablet    Sig: Take 1 tablet (500 mg total) by mouth daily.    Dispense:  7 tablet    Refill:  0  . chlorpheniramine-HYDROcodone (TUSSIONEX PENNKINETIC ER) 10-8 MG/5ML SUER    Sig: Take 5 mLs by mouth every 12 (twelve) hours as needed for cough.    Dispense:  70 mL    Refill:  0    Time spent: [place] AGNP-C Internal medicine

## 2020-01-02 DIAGNOSIS — Z20822 Contact with and (suspected) exposure to covid-19: Secondary | ICD-10-CM | POA: Diagnosis not present

## 2020-01-04 ENCOUNTER — Encounter: Payer: BC Managed Care – PPO | Admitting: Nurse Practitioner

## 2020-01-05 ENCOUNTER — Telehealth: Payer: Self-pay

## 2020-01-05 NOTE — Telephone Encounter (Signed)
Pt wanted to know what to take for congestion. Per Nira Conn pt can take Coricidin HBP for congestion

## 2020-01-16 ENCOUNTER — Other Ambulatory Visit: Payer: Self-pay

## 2020-01-16 DIAGNOSIS — M7918 Myalgia, other site: Secondary | ICD-10-CM

## 2020-01-16 MED ORDER — TIZANIDINE HCL 4 MG PO TABS: ORAL_TABLET | ORAL | 3 refills | Status: DC

## 2020-01-19 ENCOUNTER — Other Ambulatory Visit: Payer: Self-pay

## 2020-01-19 MED ORDER — ESCITALOPRAM OXALATE 10 MG PO TABS: 10.0000 mg | ORAL_TABLET | Freq: Every evening | ORAL | 1 refills | Status: DC

## 2020-01-29 ENCOUNTER — Telehealth: Payer: Self-pay

## 2020-01-29 ENCOUNTER — Other Ambulatory Visit: Payer: Self-pay

## 2020-01-29 DIAGNOSIS — J301 Allergic rhinitis due to pollen: Secondary | ICD-10-CM

## 2020-01-29 DIAGNOSIS — F411 Generalized anxiety disorder: Secondary | ICD-10-CM

## 2020-01-29 MED ORDER — ESCITALOPRAM OXALATE 10 MG PO TABS: ORAL_TABLET | ORAL | 0 refills | Status: DC

## 2020-01-29 MED ORDER — MONTELUKAST SODIUM 10 MG PO TABS: 10.0000 mg | ORAL_TABLET | Freq: Every day | ORAL | 3 refills | Status: DC

## 2020-01-29 MED ORDER — PANTOPRAZOLE SODIUM 40 MG PO TBEC: DELAYED_RELEASE_TABLET | ORAL | 3 refills | Status: DC

## 2020-01-29 MED ORDER — ALPRAZOLAM 0.5 MG PO TABS: ORAL_TABLET | ORAL | 0 refills | Status: DC

## 2020-01-29 NOTE — Telephone Encounter (Signed)
Pt called requesting for heather to giver her something to help her for during the day.  She is having some family issues and has been pretty much been crying everyday.  Feeling very emotional and down in the dumps.  The issues have been going on steady for a month now and she also lost her father in January .  She now takes xanax 0.5 mg only at bedtime and lexapro 10 mg at night.  Spoke to [facility] and she advised for pt to increase her lexapro 10 mg to twice a day and to increase her xanax 0.5 mg to 1/2 tablet during day as needed.  DFK also advised for pt to f-up with her and taylor in 4 weeks

## 2020-02-01 ENCOUNTER — Encounter: Payer: BC Managed Care – PPO | Admitting: Internal Medicine

## 2020-02-06 ENCOUNTER — Encounter: Payer: Self-pay | Admitting: Internal Medicine

## 2020-02-06 ENCOUNTER — Ambulatory Visit (INDEPENDENT_AMBULATORY_CARE_PROVIDER_SITE_OTHER): Payer: BC Managed Care – PPO | Admitting: Internal Medicine

## 2020-02-06 ENCOUNTER — Other Ambulatory Visit: Payer: Self-pay

## 2020-02-06 DIAGNOSIS — F3341 Major depressive disorder, recurrent, in partial remission: Secondary | ICD-10-CM | POA: Diagnosis not present

## 2020-02-06 DIAGNOSIS — R300 Dysuria: Secondary | ICD-10-CM | POA: Diagnosis not present

## 2020-02-06 DIAGNOSIS — F4329 Adjustment disorder with other symptoms: Secondary | ICD-10-CM

## 2020-02-06 DIAGNOSIS — Z1231 Encounter for screening mammogram for malignant neoplasm of breast: Secondary | ICD-10-CM

## 2020-02-06 DIAGNOSIS — Z0001 Encounter for general adult medical examination with abnormal findings: Secondary | ICD-10-CM

## 2020-02-06 DIAGNOSIS — I700 Atherosclerosis of aorta: Secondary | ICD-10-CM | POA: Diagnosis not present

## 2020-02-06 DIAGNOSIS — M792 Neuralgia and neuritis, unspecified: Secondary | ICD-10-CM

## 2020-02-06 DIAGNOSIS — Z23 Encounter for immunization: Secondary | ICD-10-CM

## 2020-02-06 DIAGNOSIS — I6523 Occlusion and stenosis of bilateral carotid arteries: Secondary | ICD-10-CM

## 2020-02-06 MED ORDER — MIRTAZAPINE 15 MG PO TABS: 15.0000 mg | ORAL_TABLET | Freq: Every day | ORAL | 1 refills | Status: DC

## 2020-02-06 MED ORDER — ESCITALOPRAM OXALATE 20 MG PO TABS: 20.0000 mg | ORAL_TABLET | Freq: Every day | ORAL | 1 refills | Status: DC

## 2020-02-06 MED ORDER — TRAMADOL HCL 50 MG PO TABS: 50.0000 mg | ORAL_TABLET | Freq: Two times a day (BID) | ORAL | 1 refills | Status: AC | PRN

## 2020-02-06 NOTE — Progress Notes (Signed)
[facility] [place], [place] [address]  Internal MEDICINE  Office Visit Note  Patient Name: Wendy Hubbard  [id number]  [id number]  Date of Service: 02/06/2020  Chief Complaint  Patient presents with   Annual Exam   Medication Refill    for stress   Quality Metric Gaps    hep c screening, HIV screen. Foot exam, Urine microalbumin   shingles vaccine    would like to discuss getting     HPI Pt is here for routine health maintenance examination. She has been under great deal of stress due to family issues. Her Lexapro was increased to 15 mg, and Alprazolam was given for panic attacks. She seems to be better but has problem sleeping at night. 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 She had an episode of hemoptysis, CT scan chest was negative but had aortic atherosclerosis. Denies any c/p or sob     Current Medication: Outpatient Encounter Medications as of 02/06/2020  Medication Sig Note   ALPRAZolam (XANAX) 0.5 MG tablet Take half tab in am and one at night as needed for anxiety and insomnia    butalbital-acetaminophen-caffeine (FIORICET) 50-325-40 MG tablet Take 1-2 tablets by mouth every 6 (six) hours as needed for headache.    cetirizine (ZYRTEC) 10 MG tablet Take 10 mg by mouth daily.    diphenoxylate-atropine (LOMOTIL) 2.5-0.025 MG tablet Take 1 tablet by mouth 4 (four) times daily as needed for diarrhea or loose stools.    fluticasone (FLONASE) 50 MCG/ACT nasal spray Place 2 sprays into both nostrils daily.    gabapentin (NEURONTIN) 600 MG tablet Take one AM, one after lunch, and 2 QHS    metoprolol succinate (TOPROL-XL) 50 MG 24 hr tablet Take 1 tablet (50 mg total) by mouth at bedtime. Take with or immediately following a meal.    montelukast (SINGULAIR) 10 MG tablet Take 1 tablet (10 mg total) by  mouth at bedtime.    pantoprazole (PROTONIX) 40 MG tablet Take one tablet by mouth daily.    rosuvastatin (CRESTOR) 10 MG tablet Take 1 tablet (10 mg total) by mouth daily.    tiZANidine (ZANAFLEX) 4 MG tablet Take 1 tablet po BID prn muscle pain/spasms.    [DISCONTINUED] escitalopram (LEXAPRO) 10 MG tablet Take one tab po bid for depression (Patient taking differently: Take one tab po bid for depression (takes 1/2 in morning and 1 tab at night))    [DISCONTINUED] HYDROcodone-acetaminophen (NORCO) 7.5-325 MG tablet Take 1 tablet by mouth every 4 (four) hours as needed for moderate pain.    escitalopram (LEXAPRO) 20 MG tablet Take 1 tablet (20 mg total) by mouth daily.    mirtazapine (REMERON) 15 MG tablet Take 1 tablet (15 mg total) by mouth at bedtime. For sleep    traMADol (ULTRAM) 50 MG tablet Take 1 tablet (50 mg total) by mouth every 12 (twelve) hours as needed for up to 5 days for moderate pain.    [DISCONTINUED] chlorpheniramine-HYDROcodone (TUSSIONEX PENNKINETIC ER) 10-8 MG/5ML SUER Take 5 mLs by mouth every 12 (twelve) hours as needed for cough. (Patient not taking: Reported on 02/06/2020)    [DISCONTINUED] dicyclomine (BENTYL) 10 MG capsule Take 1 capsule (10 mg total) by mouth 4 (four) times daily -  before meals and at bedtime. (Patient not taking: Reported on 02/06/2020)    [  DISCONTINUED] levofloxacin (LEVAQUIN) 500 MG tablet Take 1 tablet (500 mg total) by mouth daily. (Patient not taking: Reported on 02/06/2020)    [DISCONTINUED] ofloxacin (OCUFLOX) 0.3 % ophthalmic solution INSTILL 1 DROP INTO RIGHT EYE TWICE A DAY FOR 5 DAYS (Patient not taking: Reported on 02/06/2020)    [DISCONTINUED] prednisoLONE acetate (PRED FORTE) 1 % ophthalmic suspension 1 DROP INTO BOTH EYES 4 TIMES A DAY X7 DAYS, THEN TWICE DAILY X14 DAYS, THEN ONCE DAILY X14 DAYS (Patient not taking: Reported on 02/06/2020)    [DISCONTINUED] spironolactone (ALDACTONE) 50 MG tablet Take 50 mg by mouth daily. (Patient  not taking: Reported on 02/06/2020) 08/22/2014: .    [DISCONTINUED] valACYclovir (VALTREX) 1000 MG tablet Take 1 tablet (1,000 mg total) by mouth 3 (three) times daily. (Patient not taking: Reported on 02/06/2020)    No facility-administered encounter medications on file as of 02/06/2020.    Surgical History: Past Surgical History:  Procedure Laterality Date   ABDOMINAL HYSTERECTOMY     ANKLE ARTHROSCOPY Bilateral    APPENDECTOMY     BLADDER SUSPENSION     BREAST EXCISIONAL BIOPSY Left 1990   neg   BREAST EXCISIONAL BIOPSY Right 1989   neg   BREAST SURGERY     CHOLECYSTECTOMY     COLONOSCOPY N/A 11/04/2015   Procedure: COLONOSCOPY;  Surgeon: Manya Silvas, MD;  Location: [facility];  Service: Endoscopy;  Laterality: N/A;   ESOPHAGOGASTRODUODENOSCOPY N/A 11/02/2015   Procedure: ESOPHAGOGASTRODUODENOSCOPY (EGD);  Surgeon: Manya Silvas, MD;  Location: [facility] ENDOSCOPY;  Service: Endoscopy;  Laterality: N/A;   KNEE ARTHROSCOPY Left    REDUCTION MAMMAPLASTY Bilateral 1988   TONSILLECTOMY      Medical History: Past Medical History:  Diagnosis Date   COPD (chronic obstructive pulmonary disease) (HCC)    GERD (gastroesophageal reflux disease)    Hyperlipidemia    Hypertension    Rapid heart rate    Sleep apnea     Family History: Family History  Problem Relation Age of Onset   Hypertension Other    Bladder Cancer Mother    Kidney cancer Neg Hx    Prostate cancer Neg Hx    Breast cancer Neg Hx       Review of Systems  Constitutional: Negative for chills, diaphoresis and fatigue.  HENT: Negative for ear pain, postnasal drip and sinus pressure.   Eyes: Negative for photophobia, discharge, redness, itching and visual disturbance.  Respiratory: Negative for cough, shortness of breath and wheezing.   Cardiovascular: Negative for chest pain, palpitations and leg swelling.  Gastrointestinal: Negative for abdominal pain, constipation, diarrhea,  nausea and vomiting.  Genitourinary: Negative for dysuria and flank pain.  Musculoskeletal: Negative for arthralgias, back pain, gait problem and neck pain.  Skin: Negative for color change.  Allergic/Immunologic: Negative for environmental allergies and food allergies.  Neurological: Negative for dizziness and headaches.  Hematological: Does not bruise/bleed easily.  Psychiatric/Behavioral: Positive for dysphoric mood. Negative for agitation, behavioral problems (depression), hallucinations, self-injury and suicidal ideas. The patient is nervous/anxious.      Vital Signs: BP 126/70    Pulse 68    Temp (!) 97.3 F (36.3 C)    Resp 16    Ht 5\' 4"  (1.626 m)    Wt 196 lb (88.9 kg)    SpO2 97%    BMI 33.64 kg/m    Physical Exam Constitutional:      General: She is not in acute distress.    Appearance: She is well-developed. She is not  diaphoretic.  HENT:     Head: Normocephalic and atraumatic.     Mouth/Throat:     Pharynx: No oropharyngeal exudate.  Eyes:     Pupils: Pupils are equal, round, and reactive to light.  Neck:     Thyroid: No thyromegaly.     Vascular: No JVD.     Trachea: No tracheal deviation.  Cardiovascular:     Rate and Rhythm: Normal rate and regular rhythm.     Heart sounds: Normal heart sounds. No murmur heard.  No friction rub. No gallop.   Pulmonary:     Effort: Pulmonary effort is normal. No respiratory distress.     Breath sounds: No wheezing or rales.  Chest:     Chest wall: No tenderness.     Comments: Surgical scar noticed under her breast due breast reduction surgery  Abdominal:     General: Bowel sounds are normal.     Palpations: Abdomen is soft.  Musculoskeletal:        General: Normal range of motion.     Cervical back: Normal range of motion and neck supple.  Lymphadenopathy:     Cervical: No cervical adenopathy.  Skin:    General: Skin is warm and dry.  Neurological:     Mental Status: She is alert and oriented to person, place, and  time.     Cranial Nerves: No cranial nerve deficit.  Psychiatric:        Behavior: Behavior normal.        Thought Content: Thought content normal.        Judgment: Judgment normal.    Assessment/Plan: 1. Encounter for general adult medical examination with abnormal findings All preventive health maintenance is updated   2. Visit for screening mammogram - MM DIGITAL SCREENING BILATERAL; Future  3. Aortic atherosclerosis (HCC) Pt is on Crestor and ASA  4. Recurrent major depressive disorder, in partial remission (HCC) Increase lexapro to 20 mg po qd  escitalopram (LEXAPRO) 20 MG tablet; Take 1 tablet (20 mg total) by mouth daily.  Dispense: 90 tablet; Refill: 1 Add low dose mirtazapine (REMERON) 15 MG tablet; Take 1 tablet (15 mg total) by mouth at bedtime. For sleep  Dispense: 90 tablet; Refill: 1  5. Bilateral carotid artery occlusion Pt has evidence of atherosclerosis , will need follow up vascular  - US Carotid Bilateral; Future  6. Stress and adjustment reaction CBT is recommended, emotional support is given   7. Peripheral neuropathic pain Pt is to continue gabapentin, will stop her hydrocodone or offered to see pain management, pt chose tramadol trial  - traMADol (ULTRAM) 50 MG tablet; Take 1 tablet (50 mg total) by mouth every 12 (twelve) hours as needed for up to 5 days for moderate pain.  Dispense: 45 tablet; Refill: 1  8. Dysuria  UA/M w/rflx Culture, Routine  9. Need for zoster vaccine  Varicella-zoster vaccine subcutaneous  General Counseling: Rilla verbalizes understanding of the findings of todays visit and agrees with plan of treatment. I have discussed any further diagnostic evaluation that may be needed or ordered today. We also reviewed her medications today. she has been encouraged to call the office with any questions or concerns that should arise related to todays visit.    Orders Placed This Encounter  Procedures   MM DIGITAL SCREENING BILATERAL    US Carotid Bilateral   Varicella-zoster vaccine subcutaneous   UA/M w/rflx Culture, Routine    Meds ordered this encounter  Medications   escitalopram (LEXAPRO)  20 MG tablet    Sig: Take 1 tablet (20 mg total) by mouth daily.    Dispense:  90 tablet    Refill:  1   mirtazapine (REMERON) 15 MG tablet    Sig: Take 1 tablet (15 mg total) by mouth at bedtime. For sleep    Dispense:  90 tablet    Refill:  1   traMADol (ULTRAM) 50 MG tablet    Sig: Take 1 tablet (50 mg total) by mouth every 12 (twelve) hours as needed for up to 5 days for moderate pain.    Dispense:  45 tablet    Refill:  1    Total time spent:40 Minutes  Time spent includes review of chart, medications, test results, and follow up plan with the patient.     Lavera Guise, MD  Internal Medicine

## 2020-02-07 IMAGING — MG DIGITAL SCREENING BILATERAL MAMMOGRAM WITH TOMO AND CAD
8 series · 8 of 24 positions shown · non-contrast
Comparison: Previous exam(s).

CLINICAL DATA: Screening.

EXAM:
DIGITAL SCREENING BILATERAL MAMMOGRAM WITH TOMO AND CAD

[R MLO synth-2D]
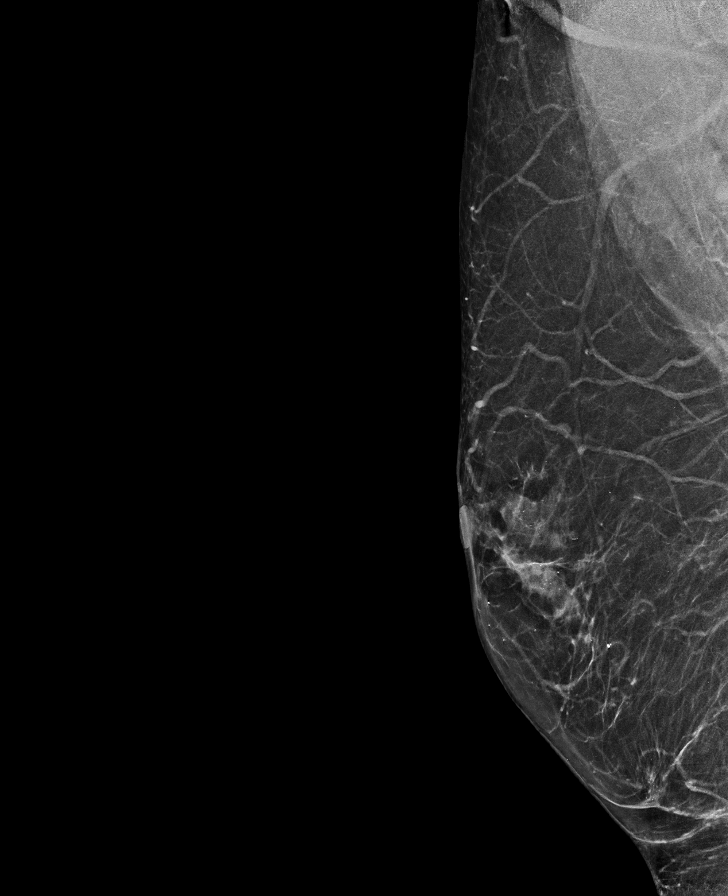

[R CC synth-2D]
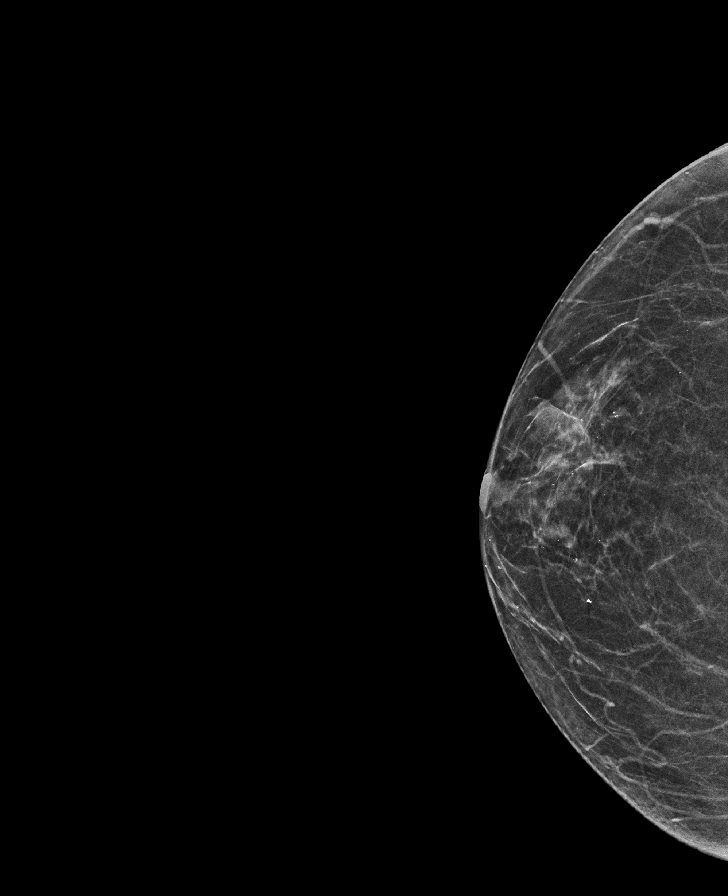

[L CC synth-2D]
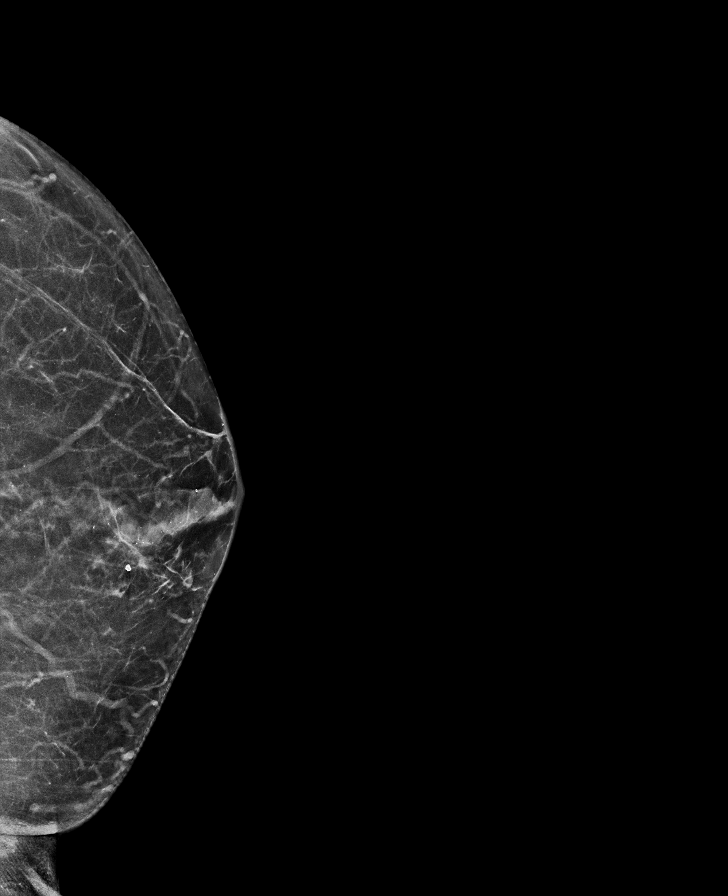

[L MLO synth-2D]
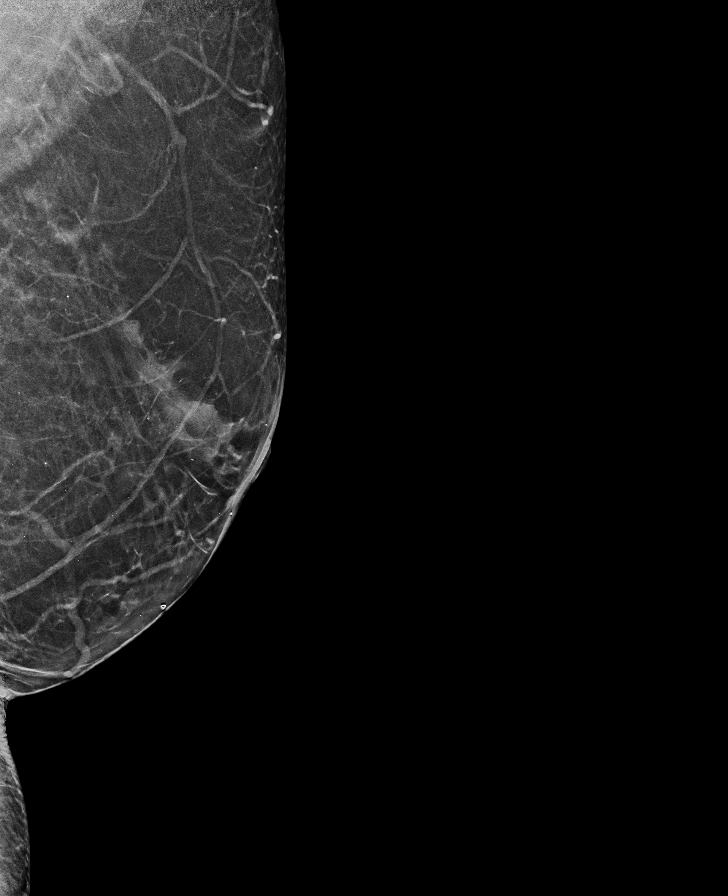

[L MLO tomo · tomo slice 27/54.0]
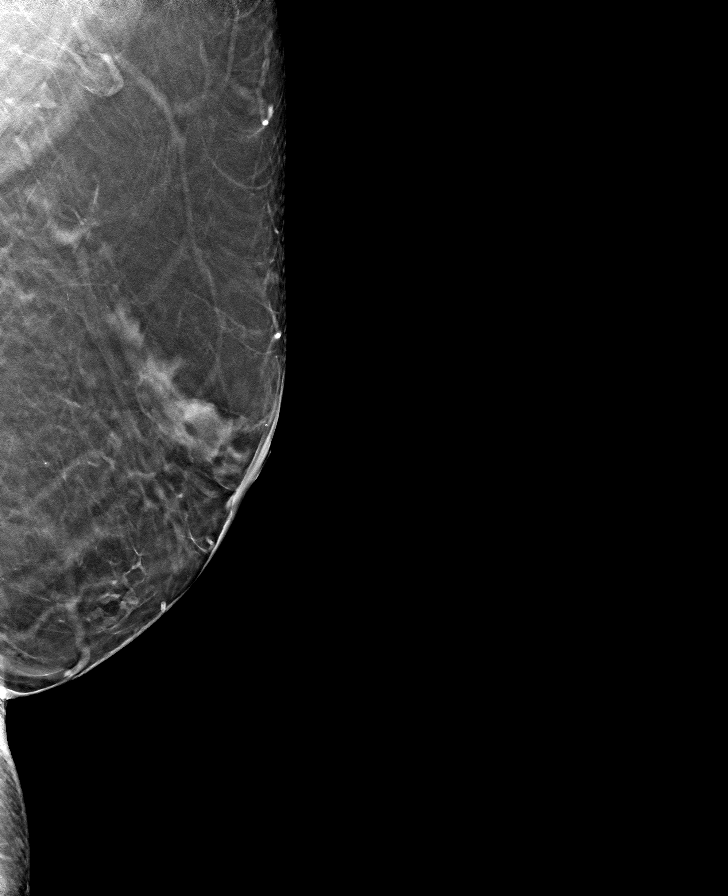

[R CC tomo · tomo slice 25/49.0]
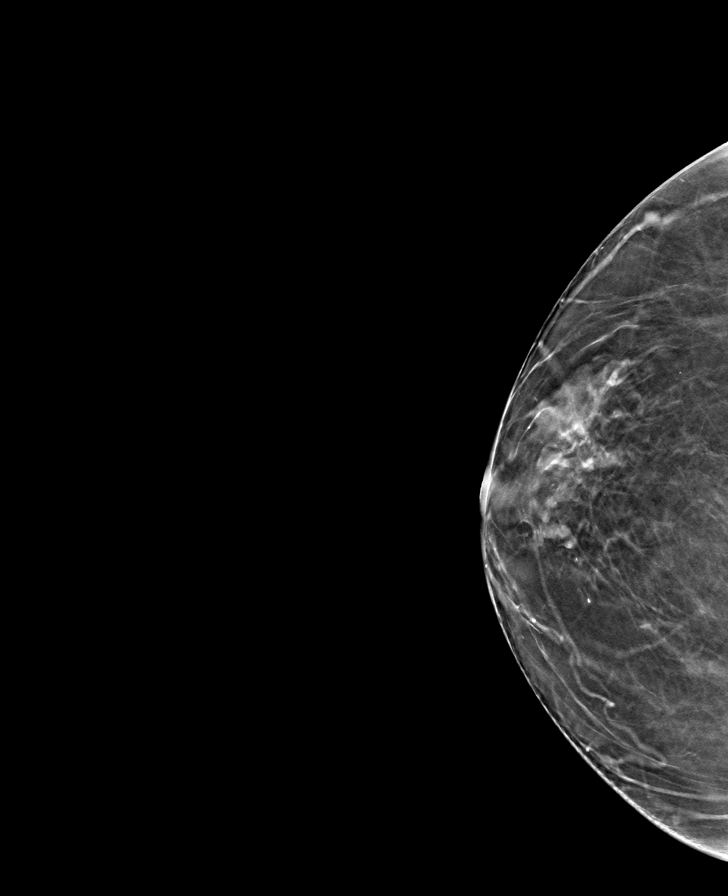

[R MLO tomo · tomo slice 33/64.0]
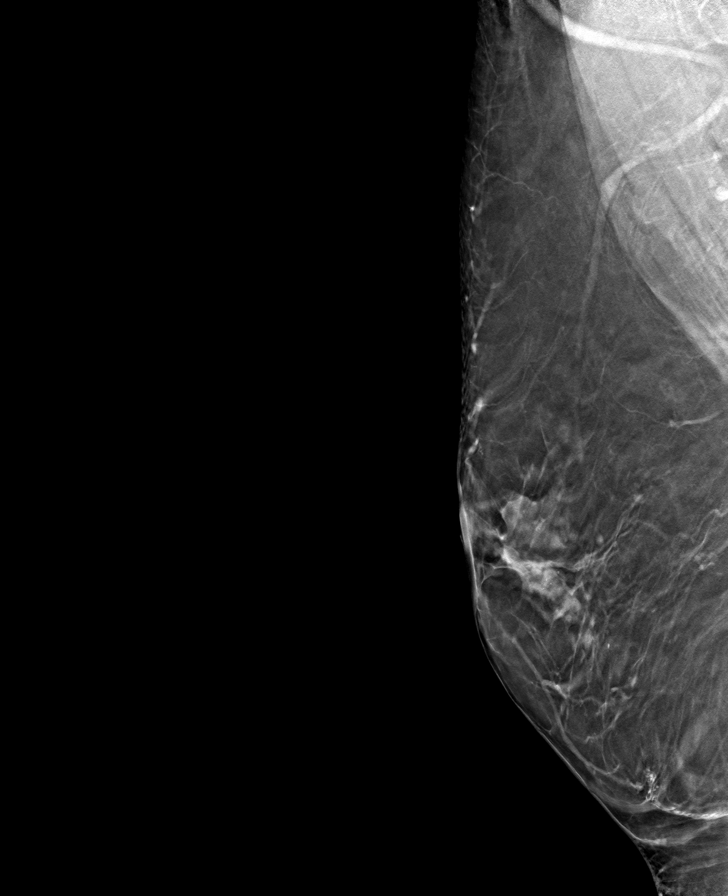

[L CC tomo · tomo slice 31/62.0]
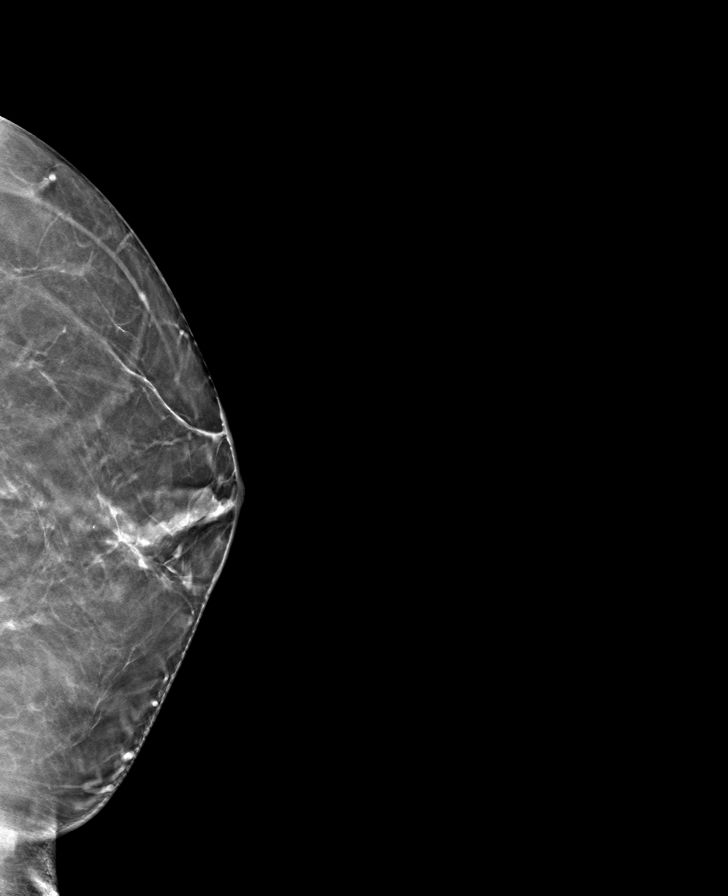

[8 of 24 positions shown; findings below may reference images not displayed]

ACR Breast Density Category b: There are scattered areas of
fibroglandular density.
FINDINGS: There are no findings suspicious for malignancy. Images were
processed with CAD.
IMPRESSION: No mammographic evidence of malignancy. A result letter of this
screening mammogram will be mailed directly to the patient.

RECOMMENDATION:
Screening mammogram in one year. (Code:[id number])

BI-RADS CATEGORY  1: Negative.

## 2020-02-12 LAB — UA/M W/RFLX CULTURE, ROUTINE
Bilirubin, UA: NEGATIVE
Glucose, UA: NEGATIVE
Ketones, UA: NEGATIVE
Nitrite, UA: NEGATIVE
Protein,UA: NEGATIVE
RBC, UA: NEGATIVE
Specific Gravity, UA: 1.015 (ref 1.005–1.030)
Urobilinogen, Ur: 0.2 mg/dL (ref 0.2–1.0)
pH, UA: 5 (ref 5.0–7.5)

## 2020-02-12 LAB — MICROSCOPIC EXAMINATION
Bacteria, UA: NONE SEEN
Casts: NONE SEEN /lpf

## 2020-02-12 LAB — URINE CULTURE, REFLEX

## 2020-02-15 ENCOUNTER — Telehealth: Payer: Self-pay

## 2020-02-15 ENCOUNTER — Other Ambulatory Visit: Payer: Self-pay

## 2020-02-15 MED ORDER — CIPROFLOXACIN HCL 500 MG PO TABS
500.0000 mg | ORAL_TABLET | Freq: Two times a day (BID) | ORAL | 0 refills | Status: DC
Start: 2020-02-15 — End: 2020-02-27

## 2020-02-15 NOTE — Telephone Encounter (Signed)
Called pt and left VM letting her know that I sent in cipro 500 mg to her pharmacy for urine infection per [facility]

## 2020-02-21 ENCOUNTER — Telehealth: Payer: Self-pay

## 2020-02-21 NOTE — Telephone Encounter (Signed)
Pt.notified

## 2020-02-21 NOTE — Telephone Encounter (Signed)
She needs to finish antibiotics, might have to order CT SCAN if continues to have pain

## 2020-02-26 ENCOUNTER — Telehealth: Payer: Self-pay

## 2020-02-26 ENCOUNTER — Emergency Department
Admission: EM | Admit: 2020-02-26 | Discharge: 2020-02-26 | Disposition: A | Discharge status: Home / Self Care | Payer: BC Managed Care – PPO | Attending: Emergency Medicine | Admitting: Emergency Medicine

## 2020-02-26 ENCOUNTER — Encounter: Payer: Self-pay | Admitting: Emergency Medicine

## 2020-02-26 ENCOUNTER — Emergency Department: Payer: BC Managed Care – PPO

## 2020-02-26 ENCOUNTER — Other Ambulatory Visit: Payer: Self-pay

## 2020-02-26 DIAGNOSIS — R300 Dysuria: Secondary | ICD-10-CM | POA: Insufficient documentation

## 2020-02-26 DIAGNOSIS — Z87891 Personal history of nicotine dependence: Secondary | ICD-10-CM | POA: Diagnosis not present

## 2020-02-26 DIAGNOSIS — K760 Fatty (change of) liver, not elsewhere classified: Secondary | ICD-10-CM | POA: Diagnosis not present

## 2020-02-26 DIAGNOSIS — E1165 Type 2 diabetes mellitus with hyperglycemia: Secondary | ICD-10-CM | POA: Insufficient documentation

## 2020-02-26 DIAGNOSIS — J449 Chronic obstructive pulmonary disease, unspecified: Secondary | ICD-10-CM | POA: Insufficient documentation

## 2020-02-26 DIAGNOSIS — Z7951 Long term (current) use of inhaled steroids: Secondary | ICD-10-CM | POA: Insufficient documentation

## 2020-02-26 DIAGNOSIS — K219 Gastro-esophageal reflux disease without esophagitis: Secondary | ICD-10-CM | POA: Insufficient documentation

## 2020-02-26 DIAGNOSIS — I700 Atherosclerosis of aorta: Secondary | ICD-10-CM | POA: Diagnosis not present

## 2020-02-26 DIAGNOSIS — I880 Nonspecific mesenteric lymphadenitis: Secondary | ICD-10-CM | POA: Diagnosis not present

## 2020-02-26 DIAGNOSIS — Z79899 Other long term (current) drug therapy: Secondary | ICD-10-CM | POA: Insufficient documentation

## 2020-02-26 DIAGNOSIS — Z9049 Acquired absence of other specified parts of digestive tract: Secondary | ICD-10-CM | POA: Diagnosis not present

## 2020-02-26 DIAGNOSIS — R109 Unspecified abdominal pain: Secondary | ICD-10-CM | POA: Insufficient documentation

## 2020-02-26 DIAGNOSIS — N281 Cyst of kidney, acquired: Secondary | ICD-10-CM | POA: Diagnosis not present

## 2020-02-26 DIAGNOSIS — I10 Essential (primary) hypertension: Secondary | ICD-10-CM | POA: Insufficient documentation

## 2020-02-26 LAB — LACTIC ACID, PLASMA: Lactic Acid, Venous: 1.7 mmol/L (ref 0.5–1.9)

## 2020-02-26 LAB — COMPREHENSIVE METABOLIC PANEL
ALT: 16 U/L (ref 0–44)
AST: 20 U/L (ref 15–41)
Albumin: 3.9 g/dL (ref 3.5–5.0)
Alkaline Phosphatase: 94 U/L (ref 38–126)
Anion gap: 9 (ref 5–15)
BUN: 12 mg/dL (ref 8–23)
CO2: 26 mmol/L (ref 22–32)
Calcium: 9 mg/dL (ref 8.9–10.3)
Chloride: 108 mmol/L (ref 98–111)
Creatinine, Ser: 0.92 mg/dL (ref 0.44–1.00)
GFR, Estimated: >60 mL/min (ref >60)
Glucose, Bld: 118 mg/dL — ABNORMAL HIGH (ref 70–99)
Potassium: 3.9 mmol/L (ref 3.5–5.1)
Sodium: 143 mmol/L (ref 135–145)
Total Bilirubin: 0.7 mg/dL (ref 0.3–1.2)
Total Protein: 6.9 g/dL (ref 6.5–8.1)

## 2020-02-26 LAB — URINALYSIS, ROUTINE W REFLEX MICROSCOPIC
Bacteria, UA: NONE SEEN
Bilirubin Urine: NEGATIVE
Glucose, UA: NEGATIVE mg/dL
Hgb urine dipstick: NEGATIVE
Ketones, ur: NEGATIVE mg/dL
Nitrite: NEGATIVE
Protein, ur: NEGATIVE mg/dL
Specific Gravity, Urine: 1.012 (ref 1.005–1.030)
WBC, UA: >50 WBC/hpf — ABNORMAL HIGH (ref 0–5)
pH: 5 (ref 5.0–8.0)

## 2020-02-26 LAB — CBC
HCT: 40.6 % (ref 36.0–46.0)
Hemoglobin: 13.1 g/dL (ref 12.0–15.0)
MCH: 31.8 pg (ref 26.0–34.0)
MCHC: 32.3 g/dL (ref 30.0–36.0)
MCV: 98.5 fL (ref 80.0–100.0)
Platelets: 283 10*3/uL (ref 150–400)
RBC: 4.12 MIL/uL (ref 3.87–5.11)
RDW: 12.9 % (ref 11.5–15.5)
WBC: 10.1 10*3/uL (ref 4.0–10.5)
nRBC: 0 % (ref 0.0–0.2)

## 2020-02-26 MED ORDER — LEVOFLOXACIN IN D5W 500 MG/100ML IV SOLN
500.0000 mg | Freq: Once | INTRAVENOUS | Status: AC
  Administered 2020-02-26: 500 mg via INTRAVENOUS
  Filled 2020-02-26: qty 100

## 2020-02-26 MED ORDER — LACTATED RINGERS IV BOLUS
1000.0000 mL | Freq: Once | INTRAVENOUS | Status: AC
  Administered 2020-02-26: 1000 mL via INTRAVENOUS

## 2020-02-26 MED ORDER — IOHEXOL 300 MG/ML  SOLN
100.0000 mL | Freq: Once | INTRAMUSCULAR | Status: AC | PRN
  Administered 2020-02-26: 100 mL via INTRAVENOUS

## 2020-02-26 MED ORDER — DEXAMETHASONE 4 MG PO TABS
8.0000 mg | ORAL_TABLET | Freq: Once | ORAL | Status: AC
  Administered 2020-02-26: 8 mg via ORAL
  Filled 2020-02-26 (×2): qty 2

## 2020-02-26 MED ORDER — ONDANSETRON HCL 4 MG/2ML IJ SOLN
4.0000 mg | Freq: Once | INTRAMUSCULAR | Status: AC
  Administered 2020-02-26: 4 mg via INTRAVENOUS
  Filled 2020-02-26: qty 2

## 2020-02-26 MED ORDER — ACETAMINOPHEN 500 MG PO TABS
1000.0000 mg | ORAL_TABLET | Freq: Once | ORAL | Status: AC
  Administered 2020-02-26: 1000 mg via ORAL
  Filled 2020-02-26: qty 2

## 2020-02-26 MED ORDER — DEXAMETHASONE 4 MG PO TABS
8.0000 mg | ORAL_TABLET | Freq: Once | ORAL | 0 refills | Status: AC
Start: 2020-02-28 — End: 2020-02-28

## 2020-02-26 MED ORDER — MORPHINE SULFATE (PF) 2 MG/ML IV SOLN
2.0000 mg | Freq: Once | INTRAVENOUS | Status: AC
  Administered 2020-02-26: 2 mg via INTRAVENOUS
  Filled 2020-02-26: qty 1

## 2020-02-26 NOTE — ED Notes (Signed)
PO challenge with ice water

## 2020-02-26 NOTE — ED Provider Notes (Signed)
[facility] Emergency Department Provider Note ____________________________________________   First MD Initiated Contact with Patient 02/26/20 1647     (approximate)  I have reviewed the triage vital signs and the nursing notes.  HISTORY  Chief Complaint Flank Pain and Dysuria   HPI Wendy Hubbard is a 64 y.o. femalewho presents to the ED for evaluation of flank pain and dysuria.   Chart review indicates history of depression anxiety.  S/p gastric bypass surgery Patient reports being seen as an outpatient for a "regular physical" that included a urinalysis, which came back positive "with 2 organisms."  She reports being prescribed a 7-day course of clindamycin twice daily, that she started 6 days ago.  She reports compliance with this medication, she shows me the medication bottle, and has 2 pills left for 1 remaining day of antibiotic coverage.  She reports developing constant right-sided flank pain 5 days ago has been present and persistent since then.  She reports the pain is positional, worse when she moves around and when she lays back onto her back.  She denies any trauma to the area.  She does report "a few days ago" developing right groin pain that has since resolved.  Flank pain is 7/10 intensity when she moves around, aching in nature, resolved by sitting still.  Associated nausea and vomiting nonbloody nonbilious emesis.  Throughout all this, she denies any dysuria or urinary symptoms.  She reports urinating at her baseline and denies any symptoms beyond her right flank pain.    Past Medical History:  Diagnosis Date  . COPD (chronic obstructive pulmonary disease) ([place])   . GERD (gastroesophageal reflux disease)   . Hyperlipidemia   . Hypertension   . Rapid heart rate   . Sleep apnea     Patient Active Problem List   Diagnosis Date Noted  . Rhomboid muscle pain 10/25/2019  . Vasomotor rhinitis 05/25/2019  . Urinary tract infection without  hematuria 02/27/2019  . Close exposure to COVID-19 virus 02/13/2019  . Generalized anxiety disorder 07/27/2018  . Uncontrolled type 2 diabetes mellitus with hyperglycemia ([place]) 07/27/2018  . Vitamin D deficiency 07/27/2018  . Acute upper respiratory infection 02/28/2018  . Cough 02/28/2018  . Sore throat 02/28/2018  . Seasonal allergic rhinitis due to pollen 02/28/2018  . Migraine without aura and without status migrainosus, not intractable 12/21/2017  . Hypertension 12/21/2017  . Type 2 diabetes mellitus with hyperglycemia, without [place]-term current use of insulin ([place]) 08/21/2017  . Pain in both feet 08/21/2017  . Neuralgia and neuritis, unspecified 08/21/2017  . Urinary tract infection with hematuria 08/21/2017  . Dysuria 08/21/2017  . Screening for breast cancer 08/21/2017  . Status post bariatric surgery 01/05/2017  . Benign essential HTN 12/05/2015  . Mixed hyperlipidemia 12/05/2015  . Shortness of breath 12/05/2015  . Primary osteoarthritis of left knee 10/04/2014  . Left knee pain 09/06/2014    Past Surgical History:  Procedure Laterality Date  . ABDOMINAL HYSTERECTOMY    . ANKLE ARTHROSCOPY Bilateral   . APPENDECTOMY    . BLADDER SUSPENSION    . BREAST EXCISIONAL BIOPSY Left 1990   neg  . BREAST EXCISIONAL BIOPSY Right 1989   neg  . BREAST SURGERY    . CHOLECYSTECTOMY    . COLONOSCOPY N/A 11/04/2015   Procedure: COLONOSCOPY;  Surgeon: Manya Silvas, MD;  Location: [facility] ENDOSCOPY;  Service: Endoscopy;  Laterality: N/A;  . ESOPHAGOGASTRODUODENOSCOPY N/A 11/02/2015   Procedure: ESOPHAGOGASTRODUODENOSCOPY (EGD);  Surgeon: Manya Silvas,  MD;  Location: [facility] ENDOSCOPY;  Service: Endoscopy;  Laterality: N/A;  . KNEE ARTHROSCOPY Left   . REDUCTION MAMMAPLASTY Bilateral 1988  . TONSILLECTOMY      Prior to Admission medications   Medication Sig Start Date End Date Taking? Authorizing Provider  ALPRAZolam Duanne Moron) 0.5 MG tablet Take half tab in am and one at night as  needed for anxiety and insomnia Patient taking differently: Take 0.25-0.5 mg by mouth 2 (two) times daily. 0.25mg -AM 0.5MG -PM 01/29/20  Yes Lavera Guise, MD  escitalopram (LEXAPRO) 20 MG tablet Take 1 tablet (20 mg total) by mouth daily. 02/06/20  Yes Lavera Guise, MD  gabapentin (NEURONTIN) 600 MG tablet Take one AM, one after lunch, and 2 QHS Patient taking differently: Take 600-1,200 mg by mouth 3 (three) times daily. 600MG -AM 600MG -NOON 1,200MG -PM 11/08/19  Yes Hyatt, Max T, DPM  metoprolol succinate (TOPROL-XL) 50 MG 24 hr tablet Take 1 tablet (50 mg total) by mouth at bedtime. Take with or immediately following a meal. 12/20/19  Yes Boscia, Heather E, NP  mirtazapine (REMERON) 15 MG tablet Take 1 tablet (15 mg total) by mouth at bedtime. For sleep 02/06/20  Yes Lavera Guise, MD  montelukast (SINGULAIR) 10 MG tablet Take 1 tablet (10 mg total) by mouth at bedtime. 01/29/20  Yes Boscia, Heather E, NP  pantoprazole (PROTONIX) 40 MG tablet Take one tablet by mouth daily. Patient taking differently: Take 40 mg by mouth daily. Take one tablet by mouth daily. 01/29/20  Yes Boscia, Greer Ee, NP  rosuvastatin (CRESTOR) 10 MG tablet Take 1 tablet (10 mg total) by mouth daily. Patient taking differently: Take 10 mg by mouth at bedtime.  11/15/19  Yes Ronnell Freshwater, NP  butalbital-acetaminophen-caffeine (FIORICET) 50-325-40 MG tablet Take 1-2 tablets by mouth every 6 (six) hours as needed for headache. 12/22/19 12/21/20  Ronnell Freshwater, NP  ciprofloxacin (CIPRO) 500 MG tablet Take 1 tablet (500 mg total) by mouth 2 (two) times daily. Patient not taking: Reported on 02/26/2020 02/15/20   Lavera Guise, MD  dexamethasone (DECADRON) 4 MG tablet Take 2 tablets (8 mg total) by mouth once for 1 dose. 02/28/20 02/28/20  Vladimir Crofts, MD  diphenoxylate-atropine (LOMOTIL) 2.5-0.025 MG tablet Take 1 tablet by mouth 4 (four) times daily as needed for diarrhea or loose stools. Patient not taking: Reported on  02/26/2020 12/08/19   Ronnell Freshwater, NP  fluticasone (FLONASE) 50 MCG/ACT nasal spray Place 2 sprays into both nostrils daily. Patient not taking: Reported on 02/26/2020 05/25/19   Ronnell Freshwater, NP  tiZANidine (ZANAFLEX) 4 MG tablet Take 1 tablet po BID prn muscle pain/spasms. Patient taking differently: Take 4 mg by mouth every 6 (six) hours as needed.  01/16/20   Ronnell Freshwater, NP    Allergies Flagyl [metronidazole], Penicillins, and Doxycycline  Family History  Problem Relation Age of Onset  . Hypertension Other   . Bladder Cancer Mother   . Kidney cancer Neg Hx   . Prostate cancer Neg Hx   . Breast cancer Neg Hx     Social History Social History   Tobacco Use  . Smoking status: Former Research scientist (life sciences)  . Smokeless tobacco: Never Used  . Tobacco comment: quit in 1996  Vaping Use  . Vaping Use: Never used  Substance Use Topics  . Alcohol use: Not Currently    Alcohol/week: 0.0 standard drinks    Comment: occasional  . Drug use: No    Review of Systems  Constitutional:  No fever/chills Eyes: No visual changes. ENT: No sore throat. Cardiovascular: Denies chest pain. Respiratory: Denies shortness of breath. Gastrointestinal: No abdominal pain.  No diarrhea.  No constipation. Positive for nausea and vomiting. Genitourinary: Negative for dysuria.  Positive for right flank pain. Musculoskeletal: Negative for back pain. Skin: Negative for rash. Neurological: Negative for headaches, focal weakness or numbness.  ____________________________________________   PHYSICAL EXAM:  VITAL SIGNS: Vitals:   02/26/20 1651 02/26/20 2100  BP:  (!) 156/65  Pulse: (!) 55 (!) 58  Resp:  16  Temp: 97.8 F (36.6 C)   SpO2:  100%      Constitutional: Alert and oriented. Well appearing and in no acute distress. Eyes: Conjunctivae are normal. PERRL. EOMI. Head: Atraumatic. Nose: No congestion/rhinnorhea. Mouth/Throat: Mucous membranes are moist.  Oropharynx  non-erythematous. Neck: No stridor. No cervical spine tenderness to palpation. Cardiovascular: Normal rate, regular rhythm. Grossly normal heart sounds.  Good peripheral circulation. Respiratory: Normal respiratory effort.  No retractions. Lungs CTAB. Gastrointestinal: Soft , nondistended, nontender to palpation. No abdominal bruits.  Right-sided CVA tenderness is present.  None on the left.  Frontal abdomen is benign. Musculoskeletal: No lower extremity tenderness nor edema.  No joint effusions. No signs of acute trauma.  No signs of trauma to the back, no spinal step-offs or spinal tenderness. Neurologic:  Normal speech and language. No gross focal neurologic deficits are appreciated. No gait instability noted. Skin:  Skin is warm, dry and intact. No rash noted. Psychiatric: Mood and affect are normal. Speech and behavior are normal.  ____________________________________________   LABS (all labs ordered are listed, but only abnormal results are displayed)  Labs Reviewed  COMPREHENSIVE METABOLIC PANEL - Abnormal; Notable for the following components:      Result Value   Glucose, Bld 118 (*)    All other components within normal limits  URINALYSIS, ROUTINE W REFLEX MICROSCOPIC - Abnormal; Notable for the following components:   Color, Urine YELLOW (*)    APPearance CLOUDY (*)    Leukocytes,Ua LARGE (*)    WBC, UA >50 (*)    All other components within normal limits  URINE CULTURE  CBC  LACTIC ACID, PLASMA    ____________________________________________  RADIOLOGY  ED MD interpretation:    Official radiology report(s): CT ABDOMEN PELVIS W CONTRAST  Result Date: 02/26/2020 CLINICAL DATA:  Current UTI.  Right flank pain. EXAM: CT ABDOMEN AND PELVIS WITH CONTRAST TECHNIQUE: Multidetector CT imaging of the abdomen and pelvis was performed using the standard protocol following bolus administration of intravenous contrast. CONTRAST:  16mL OMNIPAQUE IOHEXOL 300 MG/ML  SOLN  COMPARISON:  11/01/2015 FINDINGS: Lower chest: Lung bases are clear. No effusions. Heart is normal size. Hepatobiliary: Prior cholecystectomy. Diffuse fatty infiltration of the liver. No focal hepatic abnormality. Pancreas: No focal abnormality or ductal dilatation. Spleen: No focal abnormality.  Normal size. Adrenals/Urinary Tract: Small cyst in the midpole of the left kidney is stable. No hydronephrosis. Adrenal glands and urinary bladder unremarkable. Stomach/Bowel: Prior gastric bypass. No evidence of bowel obstruction. Few scattered colonic diverticula. Vascular/Lymphatic: Aortic atherosclerosis. No evidence of aneurysm or adenopathy. Reproductive: Prior hysterectomy.  No adnexal masses. Other: No free fluid or free air. Haziness within the mesentery. No adenopathy. Musculoskeletal: No acute bony abnormality. IMPRESSION: Mild diffuse fatty infiltration of the liver. Prior gastric bypass. Aortic atherosclerosis. Haziness within the central mesentery. This could reflect mesenteric adenitis/panniculitis. Electronically Signed   By: Rolm Baptise M.D.   On: 02/26/2020 19:07    ____________________________________________   PROCEDURES  and INTERVENTIONS  Procedure(s) performed (including Critical Care):  Procedures  Medications  lactated ringers bolus 1,000 mL (0 mLs Intravenous Stopped 02/26/20 2115)  levofloxacin (LEVAQUIN) IVPB 500 mg (0 mg Intravenous Stopped 02/26/20 2028)  ondansetron (ZOFRAN) injection 4 mg (4 mg Intravenous Given 02/26/20 1914)  morphine 2 MG/ML injection 2 mg (2 mg Intravenous Given 02/26/20 1915)  iohexol (OMNIPAQUE) 300 MG/ML solution 100 mL (100 mLs Intravenous Contrast Given 02/26/20 1854)  dexamethasone (DECADRON) tablet 8 mg (8 mg Oral Given 02/26/20 2132)  acetaminophen (TYLENOL) tablet 1,000 mg (1,000 mg Oral Given 02/26/20 2132)    ____________________________________________   MDM / ED COURSE  64 year old woman recently treated for UTI presents with right  flank pain most consistent with mesenteric adenitis and amenable to outpatient management with urology follow-up.  Normal vital signs.  Exam with vague right-sided CVA tenderness, otherwise without evidence of distress or acute pathology.  Her frontal abdomen is benign.  Blood work without evidence of pyelonephritis without leukocytosis, lactic acidosis.  She has no symptoms of UTI such as dysuria, incontinence, frequency or urgency.  She has no symptoms other than her flank pain.  Urinalysis demonstrates continued elevated leukocytes, but no bacteria or nitrites.  This was sent for urine culture.  Initial concern for pyelonephritis or perinephric abscess in the setting of her persistent urinalyses with infectious features and her development of flank pain.  Due to this, patient received a single dose of Levaquin in the ED.  CT imaging obtained to evaluate for possible obstructed stone or abscess, and thankfully without evidence of such.  She does have evidence of mesenteric adenitis or panniculitis as a likely source of her symptoms.  This is likely reactive in nature after her possible recent UTI.  Patient unable to get NSAIDs to help with anti-inflammatory properties due to her recent gastric bypass, and so gave the patient a dose of Decadron to assist with her symptoms.  Uncertain the etiology of her continued abnormal urinalyses, and so we will have the patient follow-up with urology as an outpatient.  We discussed return precautions for the ED.  Patient medically stable for discharge home.  Clinical Course as of Feb 27 51  Mon Feb 26, 2020  2011 Educated patient on CT results with evidence of mesenteric adenitis.  We discussed her urine testing with some signs of UTI, but no signs of failed infection, pyelonephritis or abscess.  We discussed finishing her clindamycin prescription at home, following up with urology and we discussed a short course of steroids to help reduce inflammation and help alleviate  her pain.  We discussed return precautions for the ED.   [DS]    Clinical Course User Index [DS] Vladimir Crofts, MD     ____________________________________________   FINAL CLINICAL IMPRESSION(S) / ED DIAGNOSES  Final diagnoses:  Mesenteric adenitis  Right flank pain     ED Discharge Orders         Ordered    dexamethasone (DECADRON) 4 MG tablet   Once        02/26/20 2129           Blakelyn Dinges   Note:  This document was prepared using Dragon voice recognition software and may include unintentional dictation errors.   Vladimir Crofts, MD 02/27/20 [phone]

## 2020-02-26 NOTE — ED Notes (Signed)
[place]bow sent to the lab.  

## 2020-02-26 NOTE — Discharge Instructions (Signed)
As we discussed, your pain is likely due to enlarged and inflamed lymph nodes in the backside of your intestines.  This is likely a reaction to a UTI.  You did not have signs of worsening infection or need for additional antibiotics.  Please finish your course of clindamycin with the remaining 2 pills tomorrow.  You are being discharged with an additional tablet of Decadron steroid.  Please take this pill on Wednesday night or Thursday morning to help reduce the inflammation and pain that you are feeling. Use Tylenol for pain and fevers.  Up to 1000 mg per dose, up to 4 times per day.  Do not take more than 4000 mg of Tylenol/acetaminophen within 24 hours..  I have attached the phone number for a local urologist, Dr. Hollice Espy, please call her to be seen in the clinic in the next 1-2 weeks.  If you develop any worsening symptoms despite these medications, please return to the ED.

## 2020-02-26 NOTE — ED Triage Notes (Signed)
PT to ER states she was seen at PCP for physical and had a UA done Friday a week ago.  States she started Abx on Tuesday for same and began having back pain.  Pt states has one day of Abx left and is continuing to have pain.  Pt states she called PCP who advised her to come for further evaluation.

## 2020-02-26 NOTE — Telephone Encounter (Signed)
Pt called that sharp pain right side of lower back in kidney area and she finished antibiotic as per dr Humphrey Rolls advised pt to go to ED

## 2020-02-27 ENCOUNTER — Telehealth: Payer: Self-pay

## 2020-02-27 ENCOUNTER — Other Ambulatory Visit: Payer: Self-pay

## 2020-02-27 ENCOUNTER — Other Ambulatory Visit: Payer: Self-pay | Admitting: Internal Medicine

## 2020-02-27 MED ORDER — CLINDAMYCIN HCL 300 MG PO CAPS
300.0000 mg | ORAL_CAPSULE | Freq: Three times a day (TID) | ORAL | 0 refills | Status: DC
Start: 2020-02-27 — End: 2020-03-07

## 2020-02-27 MED ORDER — HYDROCODONE-ACETAMINOPHEN 10-325 MG PO TABS: 1.0000 | ORAL_TABLET | Freq: Two times a day (BID) | ORAL | 0 refills | Status: DC

## 2020-02-27 NOTE — Telephone Encounter (Signed)
Pt called she went to ED yesterday they told her she need to see urology she said urologist told her need to been seen by PCP after reviewed her chart  as per dr Humphrey Rolls advised pt Ct scan showed might have infection in lymph node  we send  clindamycin for 10 days and  also send hydrocodone and also advised pt need to take antibiotic with food and need to seen after finished antibiotic

## 2020-02-28 LAB — URINE CULTURE: Culture: <10000 — AB

## 2020-02-29 ENCOUNTER — Other Ambulatory Visit: Payer: Self-pay

## 2020-02-29 ENCOUNTER — Encounter: Payer: Self-pay | Admitting: Urology

## 2020-02-29 ENCOUNTER — Other Ambulatory Visit: Payer: Self-pay | Admitting: Internal Medicine

## 2020-02-29 ENCOUNTER — Ambulatory Visit (INDEPENDENT_AMBULATORY_CARE_PROVIDER_SITE_OTHER): Payer: BC Managed Care – PPO | Admitting: Urology

## 2020-02-29 VITALS — BP 145/76 | HR 64 | Ht 64.0 in | Wt 196.0 lb

## 2020-02-29 DIAGNOSIS — R8281 Pyuria: Secondary | ICD-10-CM | POA: Diagnosis not present

## 2020-02-29 DIAGNOSIS — F411 Generalized anxiety disorder: Secondary | ICD-10-CM

## 2020-02-29 DIAGNOSIS — R300 Dysuria: Secondary | ICD-10-CM

## 2020-02-29 MED ORDER — ALPRAZOLAM 0.5 MG PO TABS: ORAL_TABLET | ORAL | 0 refills | Status: DC

## 2020-02-29 NOTE — Progress Notes (Signed)
02/29/2020 6:53 PM   Wendy Hubbard 1955/07/31 [id number]  Referring provider: Lavera Guise, [address] [place],  [place] [address]  Chief Complaint  Patient presents with  . Dysuria    HPI: Wendy Hubbard is a 64 y.o. female recently seen in the ED for flank pain and dysuria   Recent wellness visit with PCP was noted to have pyuria and urine culture positive for Klebsiella and Citrobacter  She was asymptomatic but was treated with clindamycin x7 days  States 5 days into treatment she developed bilateral low back pain  Denies urinary frequency, urgency, dysuria  Back pain worse with movement with severity rated 7/10  Did have nausea/vomiting  Urinalysis showed significant pyuria however urine culture with insignificant growth  CT abdomen pelvis with contrast performed 02/26/2020 which showed a small left renal cyst.  No calculi or hydronephrosis noted  Her back pain has persisted   PMH: Past Medical History:  Diagnosis Date  . COPD (chronic obstructive pulmonary disease) ([place])   . GERD (gastroesophageal reflux disease)   . Hyperlipidemia   . Hypertension   . Rapid heart rate   . Sleep apnea     Surgical History: Past Surgical History:  Procedure Laterality Date  . ABDOMINAL HYSTERECTOMY    . ANKLE ARTHROSCOPY Bilateral   . APPENDECTOMY    . BLADDER SUSPENSION    . BREAST EXCISIONAL BIOPSY Left 1990   neg  . BREAST EXCISIONAL BIOPSY Right 1989   neg  . BREAST SURGERY    . CHOLECYSTECTOMY    . COLONOSCOPY N/A 11/04/2015   Procedure: COLONOSCOPY;  Surgeon: Manya Silvas, MD;  Location: [facility] ENDOSCOPY;  Service: Endoscopy;  Laterality: N/A;  . ESOPHAGOGASTRODUODENOSCOPY N/A 11/02/2015   Procedure: ESOPHAGOGASTRODUODENOSCOPY (EGD);  Surgeon: Manya Silvas, MD;  Location: [facility] ENDOSCOPY;  Service: Endoscopy;  Laterality: N/A;  . KNEE ARTHROSCOPY Left   . REDUCTION MAMMAPLASTY Bilateral 1988  . TONSILLECTOMY      Home Medications:  Allergies as of  02/29/2020      Reactions   Flagyl [metronidazole] Anaphylaxis, Rash   Penicillins Anaphylaxis, Other (See Comments)   Has patient had a PCN reaction causing immediate rash, facial/tongue/throat swelling, SOB or lightheadedness with hypotension: Yes Has patient had a PCN reaction causing severe rash involving mucus membranes or skin necrosis: No Has patient had a PCN reaction that required hospitalization No Has patient had a PCN reaction occurring within the last 10 years: No If all of the above answers are "NO", then may proceed with Cephalosporin use.   Doxycycline Hives      Medication List       Accurate as of February 29, 2020  6:53 PM. If you have any questions, ask your nurse or doctor.        ALPRAZolam 0.5 MG tablet Commonly known as: XANAX Take half to one tab a day prn only for acute anxiety or panic attack What changed: additional instructions Changed by: Clayborn Bigness, MD   butalbital-acetaminophen-caffeine [phone] MG tablet Commonly known as: FIORICET Take 1-2 tablets by mouth every 6 (six) hours as needed for headache.   clindamycin 300 MG capsule Commonly known as: CLEOCIN Take 1 capsule (300 mg total) by mouth 3 (three) times daily for 10 days.   diphenoxylate-atropine 2.5-0.025 MG tablet Commonly known as: LOMOTIL Take 1 tablet by mouth 4 (four) times daily as needed for diarrhea or loose stools.   escitalopram 20 MG tablet Commonly known as: Lexapro Take 1 tablet (20  mg total) by mouth daily.   gabapentin 600 MG tablet Commonly known as: Neurontin Take one AM, one after lunch, and 2 QHS What changed:   how much to take  how to take this  when to take this  additional instructions   HYDROcodone-acetaminophen 10-325 MG tablet Commonly known as: NORCO Take 1 tablet by mouth in the morning and at bedtime. For acute and severe pain   metoprolol succinate 50 MG 24 hr tablet Commonly known as: TOPROL-XL Take 1 tablet (50 mg total) by mouth at  bedtime. Take with or immediately following a meal.   mirtazapine 15 MG tablet Commonly known as: Remeron Take 1 tablet (15 mg total) by mouth at bedtime. For sleep   montelukast 10 MG tablet Commonly known as: SINGULAIR Take 1 tablet (10 mg total) by mouth at bedtime.   pantoprazole 40 MG tablet Commonly known as: PROTONIX Take one tablet by mouth daily. What changed:   how much to take  how to take this  when to take this   rosuvastatin 10 MG tablet Commonly known as: Crestor Take 1 tablet (10 mg total) by mouth daily. What changed: when to take this   tiZANidine 4 MG tablet Commonly known as: ZANAFLEX Take 1 tablet po BID prn muscle pain/spasms. What changed:   how much to take  how to take this  when to take this  reasons to take this  additional instructions       Allergies:  Allergies  Allergen Reactions  . Flagyl [Metronidazole] Anaphylaxis and Rash  . Penicillins Anaphylaxis and Other (See Comments)    Has patient had a PCN reaction causing immediate rash, facial/tongue/throat swelling, SOB or lightheadedness with hypotension: Yes Has patient had a PCN reaction causing severe rash involving mucus membranes or skin necrosis: No Has patient had a PCN reaction that required hospitalization No Has patient had a PCN reaction occurring within the last 10 years: No If all of the above answers are "NO", then may proceed with Cephalosporin use.  Marland Kitchen Doxycycline Hives    Family History: Family History  Problem Relation Age of Onset  . Hypertension Other   . Bladder Cancer Mother   . Kidney cancer Neg Hx   . Prostate cancer Neg Hx   . Breast cancer Neg Hx     Social History:  reports that she has quit smoking. She has never used smokeless tobacco. She reports previous alcohol use. She reports that she does not use drugs.   Physical Exam: BP (!) 145/76   Pulse 64   Ht 5\' 4"  (1.626 m)   Wt 196 lb (88.9 kg)   BMI 33.64 kg/m   Constitutional:  Alert  and oriented, No acute distress. HEENT: [place] AT, moist mucus membranes.  Trachea midline, no masses. Cardiovascular: No clubbing, cyanosis, or edema. Respiratory: Normal respiratory effort, no increased work of breathing. GU: No CVA tenderness Skin: No rashes, bruises or suspicious lesions. Neurologic: Grossly intact, no focal deficits, moving all 4 extremities. Psychiatric: Normal mood and affect.  Laboratory Data:  Urinalysis Dipstick 1+ leukocyte Microscopy 11-30 WBC/0-2 RBC/0-10 epi  Pertinent Imaging: CT 10/18 images personally reviewed  Assessment & Plan:    1.  Pyuria  Bilateral back pain but CT abdomen/pelvis shows no hydronephrosis or urinary calculi  Repeat urine culture today  Recommend scheduling cystoscopy  Rx Valium 10 mg 30 minutes prior to procedure sent to pharmacy and she was informed she would need a driver   [place],  MD  [place] [address] [place], [place] [address] [phone]

## 2020-03-01 LAB — URINALYSIS, COMPLETE
Bilirubin, UA: NEGATIVE
Glucose, UA: NEGATIVE
Ketones, UA: NEGATIVE
Nitrite, UA: NEGATIVE
Protein,UA: NEGATIVE
RBC, UA: NEGATIVE
Specific Gravity, UA: 1.015 (ref 1.005–1.030)
Urobilinogen, Ur: 0.2 mg/dL (ref 0.2–1.0)
pH, UA: 5.5 (ref 5.0–7.5)

## 2020-03-01 LAB — MICROSCOPIC EXAMINATION: Bacteria, UA: NONE SEEN

## 2020-03-04 ENCOUNTER — Telehealth: Payer: Self-pay | Admitting: Radiology

## 2020-03-04 ENCOUNTER — Encounter: Payer: Self-pay | Admitting: Urology

## 2020-03-04 ENCOUNTER — Telehealth: Payer: Self-pay

## 2020-03-04 NOTE — Telephone Encounter (Signed)
Her last urine culture was negative.  Would not recommend antibiotic therapy unless she had a positive culture.  Did her PCP order C. difficile testing?  Would recommend if her diarrhea continues.

## 2020-03-04 NOTE — Telephone Encounter (Signed)
Pt called that she stopped her antibiotic due to having diarrhea also she went to se urologist and she still going to keep appt with [facility] this was just FYI and relayed message to [facility]

## 2020-03-04 NOTE — Telephone Encounter (Signed)
Patient states her PCP told her to inform Dr Bernardo Heater that she was started on clindamycin but had to stop taking it on 03/01/2020 due to uncontrollable diarrhea. Please return call to [phone].

## 2020-03-04 NOTE — Telephone Encounter (Signed)
LMOM with Dr Dene Gentry recommendations per [facility].

## 2020-03-05 ENCOUNTER — Encounter: Payer: Self-pay | Admitting: Radiology

## 2020-03-06 ENCOUNTER — Ambulatory Visit: Payer: BC Managed Care – PPO | Admitting: Internal Medicine

## 2020-03-07 ENCOUNTER — Ambulatory Visit (INDEPENDENT_AMBULATORY_CARE_PROVIDER_SITE_OTHER): Payer: BC Managed Care – PPO | Admitting: Nurse Practitioner

## 2020-03-07 ENCOUNTER — Other Ambulatory Visit: Payer: Self-pay | Admitting: Nurse Practitioner

## 2020-03-07 ENCOUNTER — Encounter: Payer: Self-pay | Admitting: Nurse Practitioner

## 2020-03-07 ENCOUNTER — Other Ambulatory Visit: Payer: Self-pay

## 2020-03-07 VITALS — BP 152/65 | HR 70 | Temp 97.5°F | Resp 16 | Ht 64.0 in | Wt 201.8 lb

## 2020-03-07 DIAGNOSIS — R197 Diarrhea, unspecified: Secondary | ICD-10-CM | POA: Insufficient documentation

## 2020-03-07 DIAGNOSIS — I10 Essential (primary) hypertension: Secondary | ICD-10-CM

## 2020-03-07 DIAGNOSIS — M792 Neuralgia and neuritis, unspecified: Secondary | ICD-10-CM

## 2020-03-07 DIAGNOSIS — K591 Functional diarrhea: Secondary | ICD-10-CM

## 2020-03-07 DIAGNOSIS — K580 Irritable bowel syndrome with diarrhea: Secondary | ICD-10-CM | POA: Diagnosis not present

## 2020-03-07 MED ORDER — HYDROCODONE-ACETAMINOPHEN 10-325 MG PO TABS: 1.0000 | ORAL_TABLET | Freq: Every day | ORAL | 0 refills | Status: DC | PRN

## 2020-03-07 NOTE — Progress Notes (Signed)
[facility] [place], [place] [address]  Internal MEDICINE  Office Visit Note  Patient Name: Wendy Hubbard  [id number]  [id number]  Date of Service: 03/07/2020  Chief Complaint  Patient presents with  . Hospitalization Follow-up  . Gastroesophageal Reflux  . Hyperlipidemia  . Hypertension  . Quality Metric Gaps    foot exam,tetnaus, Hep C  . controlled substance form    reviewed with PT  . Medication Refill    The patient is here for follow up. She was seen in ER on 02/26/2020 due to severe back pain.  She was sent to ER due to fear that she had developed pylenonephritis. CT scan showed Mild diffuse fatty infiltration of the liver. Prior gastric bypass. Aortic atherosclerosis. Haziness within the central mesentery. 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 She was started ib clindamycin 300mg  three times daily. She has developed severe diarrhea.  She states that she doesn't have abdominal pain any longer. States that she does not even feel the need to have bowel movement, and feces just flows out of her. She has had many episodes of incontinence of stool. She has history of c. Diff. She has already seen her urologist. She is scheduled to have cystoscopy on Monday, 03/11/2020. Due to aortic atherosclerosis, the patient is scheduled to have carotid doppler ultrasound. Due to severe neuropathy, will get her scheduled for arterial ultrasound of the lower extremities. The patient does need to have referral to GI.        Current Medication: Outpatient Encounter Medications as of 03/07/2020  Medication Sig  . ALPRAZolam (XANAX) 0.5 MG tablet Take half to one tab a day prn only for acute anxiety or panic attack  . butalbital-acetaminophen-caffeine (FIORICET) 50-325-40 MG tablet Take 1-2 tablets by mouth every 6 (six) hours as needed for headache.  . diphenoxylate-atropine (LOMOTIL) 2.5-0.025 MG tablet Take 1 tablet by mouth 4 (four) times daily as needed  for diarrhea or loose stools.  Marland Kitchen escitalopram (LEXAPRO) 20 MG tablet Take 1 tablet (20 mg total) by mouth daily.  Marland Kitchen gabapentin (NEURONTIN) 600 MG tablet Take one AM, one after lunch, and 2 QHS (Patient taking differently: Take 600-1,200 mg by mouth 3 (three) times daily. 600MG -AM 600MG -NOON 1,200MG -PM)  . HYDROcodone-acetaminophen (NORCO) 10-325 MG tablet Take 1 tablet by mouth daily as needed. For acute and severe pain  . metoprolol succinate (TOPROL-XL) 50 MG 24 hr tablet Take 1 tablet (50 mg total) by mouth at bedtime. Take with or immediately following a meal.  . mirtazapine (REMERON) 15 MG tablet Take 1 tablet (15 mg total) by mouth at bedtime. For sleep  . montelukast (SINGULAIR) 10 MG tablet Take 1 tablet (10 mg total) by mouth at bedtime.  . pantoprazole (PROTONIX) 40 MG tablet Take one tablet by mouth daily. (Patient taking differently: Take 40 mg by mouth daily. Take one tablet by mouth daily.)  . rosuvastatin (CRESTOR) 10 MG tablet Take 1 tablet (10 mg total) by mouth daily. (Patient taking differently: Take 10 mg by mouth at bedtime. )  . tiZANidine (ZANAFLEX) 4 MG tablet Take 1 tablet po BID prn muscle pain/spasms. (Patient taking differently: Take 4 mg by mouth every 6 (six) hours as needed. )  . [DISCONTINUED] clindamycin (CLEOCIN) 300 MG capsule Take 1 capsule (300 mg total) by mouth 3 (three) times daily for 10 days.  . [DISCONTINUED] HYDROcodone-acetaminophen (NORCO) 10-325 MG tablet Take 1 tablet by mouth in the morning and at bedtime. For acute and severe pain  No facility-administered encounter medications on file as of 03/07/2020.    Surgical History: Past Surgical History:  Procedure Laterality Date  . ABDOMINAL HYSTERECTOMY    . ANKLE ARTHROSCOPY Bilateral   . APPENDECTOMY    . BLADDER SUSPENSION    . BREAST EXCISIONAL BIOPSY Left 1990   neg  . BREAST EXCISIONAL BIOPSY Right 1989   neg  . BREAST SURGERY    . CHOLECYSTECTOMY    . COLONOSCOPY N/A 11/04/2015    Procedure: COLONOSCOPY;  Surgeon: Manya Silvas, MD;  Location[facility] ENDOSCOPY;  Service: Endoscopy;  Laterality: N/A;  . ESOPHAGOGASTRODUODENOSCOPY N/A 11/02/2015   Procedure: ESOPHAGOGASTRODUODENOSCOPY (EGD);  Surgeon: Manya Silvas, MD;  Location: [facility] ENDOSCOPY;  Service: Endoscopy;  Laterality: N/A;  . KNEE ARTHROSCOPY Left   . REDUCTION MAMMAPLASTY Bilateral 1988  . TONSILLECTOMY      Medical History: Past Medical History:  Diagnosis Date  . COPD (chronic obstructive pulmonary disease) ([place])   . GERD (gastroesophageal reflux disease)   . Hyperlipidemia   . Hypertension   . Rapid heart rate   . Sleep apnea     Family History: Family History  Problem Relation Age of Onset  . Hypertension Other   . Bladder Cancer Mother   . Kidney cancer Neg Hx   . Prostate cancer Neg Hx   . Breast cancer Neg Hx     Social History   Socioeconomic History  . Marital status: Married    Spouse name: Not on file  . Number of children: Not on file  . Years of education: Not on file  . Highest education level: Not on file  Occupational History  . Not on file  Tobacco Use  . Smoking status: Former Research scientist (life sciences)  . Smokeless tobacco: Never Used  . Tobacco comment: quit in 1996  Vaping Use  . Vaping Use: Never used  Substance and Sexual Activity  . Alcohol use: Not Currently    Alcohol/week: 0.0 standard drinks    Comment: occasional  . Drug use: No  . Sexual activity: Not on file  Other Topics Concern  . Not on file  Social History Narrative  . Not on file   Social Determinants of Health   Financial Resource Strain:   . Difficulty of Paying Living Expenses: Not on file  Food Insecurity:   . Worried About Charity fundraiser in the Last Year: Not on file  . Ran Out of Food in the Last Year: Not on file  Transportation Needs:   . Lack of Transportation (Medical): Not on file  . Lack of Transportation (Non-Medical): Not on file  Physical Activity:   . Days of Exercise per  Week: Not on file  . Minutes of Exercise per Session: Not on file  Stress:   . Feeling of Stress : Not on file  Social Connections:   . Frequency of Communication with Friends and Family: Not on file  . Frequency of Social Gatherings with Friends and Family: Not on file  . Attends Religious Services: Not on file  . Active Member of Clubs or Organizations: Not on file  . Attends Archivist Meetings: Not on file  . Marital Status: Not on file  Intimate Partner Violence:   . Fear of Current or Ex-Partner: Not on file  . Emotionally Abused: Not on file  . Physically Abused: Not on file  . Sexually Abused: Not on file      Review of Systems  Constitutional: Positive for activity  change, appetite change and fatigue. Negative for chills and unexpected weight change.  HENT: Negative for congestion, postnasal drip, rhinorrhea, sneezing and sore throat.   Respiratory: Negative for cough, chest tightness, shortness of breath and wheezing.   Cardiovascular: Negative for chest pain and palpitations.  Gastrointestinal: Positive for diarrhea. Negative for abdominal pain, constipation, nausea and vomiting.  Endocrine: Negative for cold intolerance, heat intolerance, polydipsia and polyuria.  Genitourinary: Negative for dysuria, frequency and urgency.  Musculoskeletal: Positive for arthralgias and back pain. Negative for joint swelling and neck pain.  Skin: Negative for rash.  Allergic/Immunologic: Negative for environmental allergies.  Neurological: Positive for numbness. Negative for tremors.       Severe burning sensation in both lower extremities.   Hematological: Negative for adenopathy. Does not bruise/bleed easily.  Psychiatric/Behavioral: Positive for dysphoric mood. Negative for behavioral problems (Depression), sleep disturbance and suicidal ideas. The patient is nervous/anxious.     Vital Signs: BP (!) 152/65   Pulse 70   Temp (!) 97.5 F (36.4 C)   Resp 16   Ht 5\' 4"   (1.626 m)   Wt 201 lb 12.8 oz (91.5 kg)   SpO2 98%   BMI 34.64 kg/m    Physical Exam Vitals and nursing note reviewed.  Constitutional:      General: She is not in acute distress.    Appearance: Normal appearance. She is well-developed. She is ill-appearing. She is not diaphoretic.  HENT:     Head: Normocephalic and atraumatic.     Mouth/Throat:     Pharynx: No oropharyngeal exudate.  Eyes:     Pupils: Pupils are equal, round, and reactive to light.  Neck:     Thyroid: No thyromegaly.     Vascular: No carotid bruit or JVD.     Trachea: No tracheal deviation.  Cardiovascular:     Rate and Rhythm: Normal rate and regular rhythm.     Pulses: Normal pulses.     Heart sounds: Normal heart sounds. No murmur heard.  No friction rub. No gallop.   Pulmonary:     Effort: Pulmonary effort is normal. No respiratory distress.     Breath sounds: Normal breath sounds. No wheezing or rales.  Chest:     Chest wall: No tenderness.  Abdominal:     General: Bowel sounds are increased.     Palpations: Abdomen is soft.     Tenderness: There is no abdominal tenderness.  Musculoskeletal:        General: Normal range of motion.     Cervical back: Normal range of motion and neck supple.  Lymphadenopathy:     Cervical: No cervical adenopathy.  Skin:    General: Skin is warm and dry.  Neurological:     General: No focal deficit present.     Mental Status: She is alert and oriented to person, place, and time.     Cranial Nerves: No cranial nerve deficit.  Psychiatric:        Attention and Perception: Attention and perception normal.        Mood and Affect: Mood normal. Affect is tearful.        Speech: Speech normal.        Behavior: Behavior normal. Behavior is cooperative.        Thought Content: Thought content normal.        Cognition and Memory: Cognition and memory normal.        Judgment: Judgment normal.    Assessment/Plan: 1. Functional diarrhea Patient  having multiple episodes  of diarrhea everyday, sometimes without sensation that she has to have a bowel movement. Stool samples ordered for culture, ova and parasites, and c.diff. she does have history of C.diff in the past. Will refer to new GI provider for further evaluation.  - Ambulatory referral to Gastroenterology  2. Irritable bowel syndrome with diarrhea Patient having multiple episodes of diarrhea everyday, sometimes without sensation that she has to have a bowel movement. Stool samples ordered for culture, ova and parasites, and c.diff. she does have history of C.diff in the past. Will refer to new GI provider for further evaluation. - Ambulatory referral to Gastroenterology  3. Neuralgia and neuritis, unspecified Will get arterial ultrasound of bilateral lower extremities for further evaluation.  - VAS [place] LOWER EXTREMITY ARTERIAL DUPLEX; Future  4. Peripheral neuropathic pain Will get arterial ultrasound of bilateral lower extremities for further evaluation.  May take hydrocodone/APAP 10/325mg  only once daily if needed for severe pain. Single prescription for #30 tablets sent to her pharmacy . - VAS [place] LOWER EXTREMITY ARTERIAL DUPLEX; Future - HYDROcodone-acetaminophen (NORCO) 10-325 MG tablet; Take 1 tablet by mouth daily as needed. For acute and severe pain  Dispense: 30 tablet; Refill: 0  5. Essential hypertension Generally stable. Continue bp medication as prescribed   General Counseling: Manju verbalizes understanding of the findings of todays visit and agrees with plan of treatment. I have discussed any further diagnostic evaluation that may be needed or ordered today. We also reviewed her medications today. she has been encouraged to call the office with any questions or concerns that should arise related to todays visit.  This patient was seen by Leretha Pol FNP Collaboration with Dr Lavera Guise as a part of collaborative care agreement  Orders Placed This Encounter  Procedures  . Ambulatory  referral to Gastroenterology  . VAS [place] LOWER EXTREMITY ARTERIAL DUPLEX    Meds ordered this encounter  Medications  . HYDROcodone-acetaminophen (NORCO) 10-325 MG tablet    Sig: Take 1 tablet by mouth daily as needed. For acute and severe pain    Dispense:  30 tablet    Refill:  0    Fill after 03/11/2020    Order Specific Question:   Supervising Provider    Answer:   Lavera Guise [[id number]]    Total time spent: 64 Minutes  Time spent includes review of chart, medications, test results, and follow up plan with the patient.      Dr Lavera Guise Internal medicine

## 2020-03-08 ENCOUNTER — Ambulatory Visit: Payer: BC Managed Care – PPO

## 2020-03-08 DIAGNOSIS — I6523 Occlusion and stenosis of bilateral carotid arteries: Secondary | ICD-10-CM | POA: Diagnosis not present

## 2020-03-08 LAB — BASIC METABOLIC PANEL
BUN/Creatinine Ratio: 19 (ref 12–28)
BUN: 14 mg/dL (ref 8–27)
CO2: 23 mmol/L (ref 20–29)
Calcium: 9.3 mg/dL (ref 8.7–10.3)
Chloride: 106 mmol/L (ref 96–106)
Creatinine, Ser: 0.75 mg/dL (ref 0.57–1.00)
GFR calc Af Amer: 97 mL/min/{1.73_m2} (ref >59)
GFR calc non Af Amer: 85 mL/min/{1.73_m2} (ref >59)
Glucose: 89 mg/dL (ref 65–99)
Potassium: 4.9 mmol/L (ref 3.5–5.2)
Sodium: 143 mmol/L (ref 134–144)

## 2020-03-08 NOTE — Progress Notes (Signed)
   03/11/2020  CC:  Chief Complaint  Patient presents with  . Cysto    [id number] R Bettendorf is a 64 y.o. female who returns for a cystoscopy.    Refer to prior office note 02/26/2020  Urine culture with insignificant growth  Remains asymptomatic without frequency, urgency, dysuria  UA today 11-30 WBCs   Blood pressure 126/76, pulse 69, height 5\' 4"  (1.626 m), weight 196 lb (88.9 kg). NED. A&Ox3.   No respiratory distress   Abd soft, NT, ND Atrophic external genitalia with patent urethral meatus  Cystoscopy Procedure Note  Patient identification was confirmed, informed consent was obtained, and patient was prepped using Betadine solution.  Lidocaine jelly was administered per urethral meatus.    Procedure: - Flexible cystoscope introduced, without any difficulty.   - Thorough search of the bladder revealed:    normal urethral meatus    normal urothelium    no stones    no ulcers     no tumors    no urethral polyps    no trabeculation  - Ureteral orifices were normal in position and appearance.  Post-Procedure: - Patient tolerated the procedure well  Assessment/ Plan:  1. Pyuria   Cystoscopy today shows no abnormalities   Pyuria and intermittent bacteriuria asymptomatic and represents colonization  Would not treat with antibiotics unless she has symptoms  No urologic source of her back pain identified  68-month follow-up or earlier as needed for symptoms   I, Selena Batten, am acting as a scribe for Dr. Nicki Reaper C. Jasiel Belisle,  I have reviewed the above documentation for accuracy and completeness, and I agree with the above.    Abbie Sons, MD

## 2020-03-11 ENCOUNTER — Telehealth: Payer: Self-pay | Admitting: Urology

## 2020-03-11 ENCOUNTER — Encounter: Payer: Self-pay | Admitting: Urology

## 2020-03-11 ENCOUNTER — Ambulatory Visit (INDEPENDENT_AMBULATORY_CARE_PROVIDER_SITE_OTHER): Payer: BC Managed Care – PPO | Admitting: Urology

## 2020-03-11 ENCOUNTER — Other Ambulatory Visit: Payer: Self-pay

## 2020-03-11 VITALS — BP 126/76 | HR 69 | Ht 64.0 in | Wt 196.0 lb

## 2020-03-11 DIAGNOSIS — R8281 Pyuria: Secondary | ICD-10-CM | POA: Diagnosis not present

## 2020-03-11 NOTE — Telephone Encounter (Signed)
Notified patient as instructed, patient pleased. Discussed follow-up appointments, patient agrees  

## 2020-03-11 NOTE — Telephone Encounter (Signed)
Please schedule 34-month follow-up with urinalysis and let patient know since she does not have bladder/pelvic symptoms additional pelvic imaging is not needed

## 2020-03-12 ENCOUNTER — Ambulatory Visit: Payer: BC Managed Care – PPO | Admitting: Internal Medicine

## 2020-03-12 LAB — URINALYSIS, COMPLETE
Bilirubin, UA: NEGATIVE
Glucose, UA: NEGATIVE
Ketones, UA: NEGATIVE
Nitrite, UA: NEGATIVE
Protein,UA: NEGATIVE
Specific Gravity, UA: 1.025 (ref 1.005–1.030)
Urobilinogen, Ur: 0.2 mg/dL (ref 0.2–1.0)
pH, UA: 5.5 (ref 5.0–7.5)

## 2020-03-12 LAB — MICROSCOPIC EXAMINATION: Bacteria, UA: NONE SEEN

## 2020-03-14 ENCOUNTER — Ambulatory Visit: Payer: BC Managed Care – PPO | Admitting: Internal Medicine

## 2020-03-14 ENCOUNTER — Telehealth: Payer: Self-pay

## 2020-03-14 ENCOUNTER — Encounter: Payer: Self-pay | Admitting: Internal Medicine

## 2020-03-14 VITALS — Resp 16 | Ht 64.0 in | Wt 196.0 lb

## 2020-03-14 DIAGNOSIS — Z20822 Contact with and (suspected) exposure to covid-19: Secondary | ICD-10-CM

## 2020-03-14 DIAGNOSIS — R439 Unspecified disturbances of smell and taste: Secondary | ICD-10-CM

## 2020-03-14 DIAGNOSIS — J069 Acute upper respiratory infection, unspecified: Secondary | ICD-10-CM

## 2020-03-14 NOTE — Progress Notes (Signed)
[facility] [place], [place] [address]  Internal MEDICINE  Telephone Visit  Patient Name: Wendy Hubbard  [id number]  [id number]  Date of Service: 03/20/2020  I connected with the patient at 50 am  by telephone and verified the patients identity using two identifiers.   I discussed the limitations, risks, security and privacy concerns of performing an evaluation and management service by telephone and the availability of in person appointments. I also discussed with the patient that there may be a patient responsible charge related to the service.  The patient expressed understanding and agrees to proceed.    Chief Complaint  Patient presents with  . Acute Visit    not able to taste anything but aluminum, runny nose, sore throat, no energy, has been going on for last 2 weeks, has not been tested for covid, 03/04/20 pt was in the emergency room  . Telephone Assessment    [phone]  . Telephone Screen    video or phone call is fine  . Quality Metric Gaps    foot exam    HPI Pt is connected via video. C/o sore throat, runny nose and fatigue. No exposure to covid however was in ED and is concerned about it, pt has h/o gastric bypass and has not been using any meds for DM. Condition is resolved  Current Medication: Outpatient Encounter Medications as of 03/14/2020  Medication Sig  . ALPRAZolam (XANAX) 0.5 MG tablet Take half to one tab a day prn only for acute anxiety or panic attack  . butalbital-acetaminophen-caffeine (FIORICET) 50-325-40 MG tablet Take 1-2 tablets by mouth every 6 (six) hours as needed for headache.  . diphenoxylate-atropine (LOMOTIL) 2.5-0.025 MG tablet Take 1 tablet by mouth 4 (four) times daily as needed for diarrhea or loose stools.  Marland Kitchen escitalopram (LEXAPRO) 20 MG tablet Take 1 tablet (20 mg total) by mouth daily.  Marland Kitchen gabapentin (NEURONTIN) 600 MG tablet Take one AM, one after lunch, and 2 QHS (Patient taking differently: Take 600-1,200  mg by mouth 3 (three) times daily. 600MG -AM 600MG -NOON 1,200MG -PM)  . HYDROcodone-acetaminophen (NORCO) 10-325 MG tablet Take 1 tablet by mouth daily as needed. For acute and severe pain  . metoprolol succinate (TOPROL-XL) 50 MG 24 hr tablet Take 1 tablet (50 mg total) by mouth at bedtime. Take with or immediately following a meal.  . mirtazapine (REMERON) 15 MG tablet Take 1 tablet (15 mg total) by mouth at bedtime. For sleep  . montelukast (SINGULAIR) 10 MG tablet Take 1 tablet (10 mg total) by mouth at bedtime.  . pantoprazole (PROTONIX) 40 MG tablet Take one tablet by mouth daily. (Patient taking differently: Take 40 mg by mouth daily. Take one tablet by mouth daily.)  . rosuvastatin (CRESTOR) 10 MG tablet Take 1 tablet (10 mg total) by mouth daily. (Patient taking differently: Take 10 mg by mouth at bedtime. )  . tiZANidine (ZANAFLEX) 4 MG tablet Take 1 tablet po BID prn muscle pain/spasms. (Patient taking differently: Take 4 mg by mouth every 6 (six) hours as needed. )   No facility-administered encounter medications on file as of 03/14/2020.    Surgical History: Past Surgical History:  Procedure Laterality Date  . ABDOMINAL HYSTERECTOMY    . ANKLE ARTHROSCOPY Bilateral   . APPENDECTOMY    . BLADDER SUSPENSION    . BREAST EXCISIONAL BIOPSY Left 1990   neg  . BREAST EXCISIONAL BIOPSY Right 1989   neg  . BREAST SURGERY    . CHOLECYSTECTOMY    .  COLONOSCOPY N/A 11/04/2015   Procedure: COLONOSCOPY;  Surgeon: Manya Silvas, MD;  Location: [facility] ENDOSCOPY;  Service: Endoscopy;  Laterality: N/A;  . ESOPHAGOGASTRODUODENOSCOPY N/A 11/02/2015   Procedure: ESOPHAGOGASTRODUODENOSCOPY (EGD);  Surgeon: Manya Silvas, MD;  Location: [facility] ENDOSCOPY;  Service: Endoscopy;  Laterality: N/A;  . KNEE ARTHROSCOPY Left   . REDUCTION MAMMAPLASTY Bilateral 1988  . TONSILLECTOMY      Medical History: Past Medical History:  Diagnosis Date  . COPD (chronic obstructive pulmonary disease) ([place])   .  GERD (gastroesophageal reflux disease)   . Hyperlipidemia   . Hypertension   . Rapid heart rate   . Sleep apnea     Family History: Family History  Problem Relation Age of Onset  . Hypertension Other   . Bladder Cancer Mother   . Kidney cancer Neg Hx   . Prostate cancer Neg Hx   . Breast cancer Neg Hx     Social History   Socioeconomic History  . Marital status: Married    Spouse name: Not on file  . Number of children: Not on file  . Years of education: Not on file  . Highest education level: Not on file  Occupational History  . Not on file  Tobacco Use  . Smoking status: Former Research scientist (life sciences)  . Smokeless tobacco: Never Used  . Tobacco comment: quit in 1996  Vaping Use  . Vaping Use: Never used  Substance and Sexual Activity  . Alcohol use: Not Currently    Alcohol/week: 0.0 standard drinks    Comment: occasional  . Drug use: No  . Sexual activity: Not on file  Other Topics Concern  . Not on file  Social History Narrative  . Not on file   Social Determinants of Health   Financial Resource Strain:   . Difficulty of Paying Living Expenses: Not on file  Food Insecurity:   . Worried About Charity fundraiser in the Last Year: Not on file  . Ran Out of Food in the Last Year: Not on file  Transportation Needs:   . Lack of Transportation (Medical): Not on file  . Lack of Transportation (Non-Medical): Not on file  Physical Activity:   . Days of Exercise per Week: Not on file  . Minutes of Exercise per Session: Not on file  Stress:   . Feeling of Stress : Not on file  Social Connections:   . Frequency of Communication with Friends and Family: Not on file  . Frequency of Social Gatherings with Friends and Family: Not on file  . Attends Religious Services: Not on file  . Active Member of Clubs or Organizations: Not on file  . Attends Archivist Meetings: Not on file  . Marital Status: Not on file  Intimate Partner Violence:   . Fear of Current or  Ex-Partner: Not on file  . Emotionally Abused: Not on file  . Physically Abused: Not on file  . Sexually Abused: Not on file      Review of Systems  Constitutional: Positive for fatigue. Negative for fever.  HENT: Negative.  Negative for hearing loss and mouth sores.        Loss of taste   Respiratory: Negative.   Cardiovascular: Negative.   Gastrointestinal: Negative.     Vital Signs: Resp 16   Ht 5\' 4"  (1.626 m)   Wt 196 lb (88.9 kg)   BMI 33.64 kg/m    Observation/Objective: Pt is in NAD, just concerned    Assessment/Plan:  1. Suspected COVID-19 virus infection Pt is instructed to get tested again   2. Taste disorder Monitor for now   3. Viral upper respiratory tract infection Monitor symptoms for now, rest and hydrate   General Counseling: Tovah verbalizes understanding of the findings of today's phone visit and agrees with plan of treatment. I have discussed any further diagnostic evaluation that may be needed or ordered today. We also reviewed her medications today. she has been encouraged to call the office with any questions or concerns that should arise related to todays visit.   Time spent: 75 Minutes    Dr Lavera Guise Internal medicine

## 2020-03-14 NOTE — Telephone Encounter (Signed)
We make her appt today

## 2020-03-15 ENCOUNTER — Ambulatory Visit: Payer: BC Managed Care – PPO | Admitting: Nurse Practitioner

## 2020-03-17 NOTE — Procedures (Signed)
[place], [place] [address]  DATE OF SERVICE: March 08, 2020  CAROTID DOPPLER INTERPRETATION:  Bilateral Carotid Ultrsasound and Color Doppler Examination was performed. The RIGHT CCA shows mild plaque in the vessel. The LEFT CCA shows mild plaque in the vessel. There was no significant intimal thickening noted in the RIGHT carotid artery. There was no significant intimal thickening in the LEFT carotid artery.  The RIGHT CCA shows peak systolic velocity of 354 cm per second. The end diastolic velocity is 18 cm per second on the RIGHT side. The RIGHT ICA shows peak systolic velocity of 83 per second. RIGHT sided ICA end diastolic velocity is 24 cm per second. The RIGHT ECA shows a peak systolic velocity of 562 cm per second. The ICA/CCA ratio is calculated to be 0.8. This suggests less than 50% stenosis. The Vertebral Artery shows antegrade flow.  The LEFT CCA shows peak systolic velocity of 92 cm per second. The end diastolic velocity is 18 cm per second on the LEFT side. The LEFT ICA shows peak systolic velocity of 76 per second. LEFT sided ICA end diastolic velocity is 28 cm per second. The LEFT ECA shows a peak systolic velocity of 76 cm per second. The ICA/CCA ratio is calculated to be 0.82. This suggests less than 50% stenosis. The Vertebral Artery shows antegrade flow.   Impression:    The RIGHT CAROTID shows less than 50% stenosis. The LEFT CAROTID shows less than 50% stenosis.  There is mild plaque formation noted on the LEFT and mild plaque on the RIGHT  side. Consider a repeat Carotid doppler if clinical situation and symptoms warrant in 6-12 months. Patient should be encouraged to change lifestyles such as smoking cessation, regular exercise and dietary modification. Use of statins in the right clinical setting and ASA is encouraged.  Allyne Gee, MD [facility] Pulmonary Critical Care Medicine

## 2020-03-20 ENCOUNTER — Telehealth: Payer: Self-pay

## 2020-03-20 NOTE — Telephone Encounter (Signed)
I sent a prescription to her pharmacy at her visit which should not have been filled after 11/1. Can you find out if she filled that one? If so, she should definitely not be out. Thanks.

## 2020-03-20 NOTE — Telephone Encounter (Signed)
Pharmacy had meds.

## 2020-03-20 NOTE — Telephone Encounter (Signed)
Spoke with pt and informed her she should not be out, and that HB sent the script at her last visit. Pt said she picked up meds yesterday and the pharmacy didn't give her the hydrocodone. Pt also said she didn't ask them for it. She will call pharmacy and see if it is there.

## 2020-03-27 ENCOUNTER — Other Ambulatory Visit: Payer: Self-pay

## 2020-03-27 ENCOUNTER — Ambulatory Visit: Payer: BC Managed Care – PPO

## 2020-03-27 DIAGNOSIS — G629 Polyneuropathy, unspecified: Secondary | ICD-10-CM | POA: Diagnosis not present

## 2020-03-27 DIAGNOSIS — M792 Neuralgia and neuritis, unspecified: Secondary | ICD-10-CM

## 2020-03-28 ENCOUNTER — Other Ambulatory Visit: Payer: Self-pay

## 2020-03-28 ENCOUNTER — Telehealth: Payer: Self-pay

## 2020-03-28 MED ORDER — FUROSEMIDE 20 MG PO TABS
20.0000 mg | ORAL_TABLET | Freq: Every day | ORAL | 0 refills | Status: DC
Start: 2020-03-28 — End: 2020-04-19

## 2020-03-28 NOTE — Telephone Encounter (Signed)
Pt called that need refills for lasix foe leg swelling she used to take it as per heather we lasix 20 mg

## 2020-04-03 ENCOUNTER — Encounter: Payer: Self-pay | Admitting: Nurse Practitioner

## 2020-04-03 ENCOUNTER — Ambulatory Visit: Payer: BC Managed Care – PPO | Admitting: Nurse Practitioner

## 2020-04-03 VITALS — BP 131/56 | HR 66 | Temp 97.5°F | Resp 16 | Ht 64.0 in | Wt 204.2 lb

## 2020-04-03 DIAGNOSIS — M792 Neuralgia and neuritis, unspecified: Secondary | ICD-10-CM | POA: Diagnosis not present

## 2020-04-03 DIAGNOSIS — I739 Peripheral vascular disease, unspecified: Secondary | ICD-10-CM | POA: Diagnosis not present

## 2020-04-03 DIAGNOSIS — K580 Irritable bowel syndrome with diarrhea: Secondary | ICD-10-CM

## 2020-04-03 DIAGNOSIS — I10 Essential (primary) hypertension: Secondary | ICD-10-CM | POA: Diagnosis not present

## 2020-04-03 DIAGNOSIS — I700 Atherosclerosis of aorta: Secondary | ICD-10-CM

## 2020-04-03 MED ORDER — HYDROCODONE-ACETAMINOPHEN 10-325 MG PO TABS: 1.0000 | ORAL_TABLET | Freq: Every day | ORAL | 0 refills | Status: DC | PRN

## 2020-04-03 MED ORDER — CILOSTAZOL 50 MG PO TABS: 50.0000 mg | ORAL_TABLET | Freq: Two times a day (BID) | ORAL | 1 refills | Status: DC

## 2020-04-03 NOTE — Progress Notes (Signed)
[facility] [place], [place] [address]  Internal MEDICINE  Office Visit Note  Patient Name: Wendy Hubbard  [id number]  [id number]  Date of Service: 05/08/2020  Chief Complaint  Patient presents with  . Follow-up    review test results   . Gastroesophageal Reflux  . Sleep Apnea  . Hypertension  . Hyperlipidemia  . policy update form    received    The patient is here for follow up visit. She has had carotid doppler and ultrasound of arteries of lower extremities since she was last seen.  -arterial ultrasound of the extremities shows diffuse atherosclerosis throughout the extremities without evidence of stenosis.  -continues to have severe pain in both lower extremities. This is especially bad at nights. The only thing that has helped her pain is hydrocodone/APAP. She does need to have a new prescription for this today.  Her blood sugars are well managed without medication at this time.  -carotid doppler is showing mild plaque with <50% stenosis, bilaterally. She currently takes rosuvastatin 10mg  daily.       Current Medication: Outpatient Encounter Medications as of 04/03/2020  Medication Sig  . ALPRAZolam (XANAX) 0.5 MG tablet Take half to one tab a day prn only for acute anxiety or panic attack  . butalbital-acetaminophen-caffeine (FIORICET) 50-325-40 MG tablet Take 1-2 tablets by mouth every 6 (six) hours as needed for headache.  . diphenoxylate-atropine (LOMOTIL) 2.5-0.025 MG tablet Take 1 tablet by mouth 4 (four) times daily as needed for diarrhea or loose stools.  Marland Kitchen escitalopram (LEXAPRO) 20 MG tablet Take 1 tablet (20 mg total) by mouth daily.  Marland Kitchen gabapentin (NEURONTIN) 600 MG tablet Take one AM, one after lunch, and 2 QHS (Patient taking differently: Take 600-1,200 mg by mouth 3 (three) times daily. 600MG -AM 600MG -NOON 1,200MG -PM)  . metoprolol succinate (TOPROL-XL) 50 MG 24 hr tablet Take 1 tablet (50 mg total) by mouth at bedtime. Take with or  immediately following a meal.  . mirtazapine (REMERON) 15 MG tablet Take 1 tablet (15 mg total) by mouth at bedtime. For sleep  . montelukast (SINGULAIR) 10 MG tablet Take 1 tablet (10 mg total) by mouth at bedtime.  . rosuvastatin (CRESTOR) 10 MG tablet Take 1 tablet (10 mg total) by mouth daily. (Patient taking differently: Take 10 mg by mouth at bedtime.)  . tiZANidine (ZANAFLEX) 4 MG tablet Take 1 tablet po BID prn muscle pain/spasms. (Patient taking differently: Take 4 mg by mouth every 6 (six) hours as needed.)  . [DISCONTINUED] furosemide (LASIX) 20 MG tablet Take 1 tablet (20 mg total) by mouth daily. Take once a day as needed edema  . [DISCONTINUED] HYDROcodone-acetaminophen (NORCO) 10-325 MG tablet Take 1 tablet by mouth daily as needed. For acute and severe pain  . [DISCONTINUED] HYDROcodone-acetaminophen (NORCO) 10-325 MG tablet Take 1 tablet by mouth daily as needed. For acute and severe pain  . [DISCONTINUED] pantoprazole (PROTONIX) 40 MG tablet Take one tablet by mouth daily. (Patient taking differently: Take 40 mg by mouth daily. Take one tablet by mouth daily.)  . [DISCONTINUED] cilostazol (PLETAL) 50 MG tablet Take 1 tablet (50 mg total) by mouth 2 (two) times daily.   No facility-administered encounter medications on file as of 04/03/2020.    Surgical History: Past Surgical History:  Procedure Laterality Date  . ABDOMINAL HYSTERECTOMY    . ANKLE ARTHROSCOPY Bilateral   . APPENDECTOMY    . BLADDER SUSPENSION    . BREAST EXCISIONAL BIOPSY Left 1990  neg  . BREAST EXCISIONAL BIOPSY Right 1989   neg  . BREAST SURGERY    . CHOLECYSTECTOMY    . COLONOSCOPY N/A 11/04/2015   Procedure: COLONOSCOPY;  Surgeon: Manya Silvas, MD;  Location: [facility] ENDOSCOPY;  Service: Endoscopy;  Laterality: N/A;  . ESOPHAGOGASTRODUODENOSCOPY N/A 11/02/2015   Procedure: ESOPHAGOGASTRODUODENOSCOPY (EGD);  Surgeon: Manya Silvas, MD;  Location: [facility] ENDOSCOPY;  Service: Endoscopy;  Laterality:  N/A;  . KNEE ARTHROSCOPY Left   . REDUCTION MAMMAPLASTY Bilateral 1988  . TONSILLECTOMY      Medical History: Past Medical History:  Diagnosis Date  . COPD (chronic obstructive pulmonary disease) ([place])   . GERD (gastroesophageal reflux disease)   . Hyperlipidemia   . Hypertension   . Rapid heart rate   . Sleep apnea     Family History: Family History  Problem Relation Age of Onset  . Hypertension Other   . Bladder Cancer Mother   . Kidney cancer Neg Hx   . Prostate cancer Neg Hx   . Breast cancer Neg Hx     Social History   Socioeconomic History  . Marital status: Married    Spouse name: Not on file  . Number of children: Not on file  . Years of education: Not on file  . Highest education level: Not on file  Occupational History  . Not on file  Tobacco Use  . Smoking status: Former Research scientist (life sciences)  . Smokeless tobacco: Never Used  . Tobacco comment: quit in 1996  Vaping Use  . Vaping Use: Never used  Substance and Sexual Activity  . Alcohol use: Not Currently    Alcohol/week: 0.0 standard drinks    Comment: occasional  . Drug use: No  . Sexual activity: Not on file  Other Topics Concern  . Not on file  Social History Narrative  . Not on file   Social Determinants of Health   Financial Resource Strain: Not on file  Food Insecurity: Not on file  Transportation Needs: Not on file  Physical Activity: Not on file  Stress: Not on file  Social Connections: Not on file  Intimate Partner Violence: Not on file      Review of Systems  Constitutional: Positive for fatigue. Negative for activity change, appetite change, chills and unexpected weight change.  HENT: Negative for congestion, postnasal drip, rhinorrhea, sneezing and sore throat.   Respiratory: Negative for cough, chest tightness, shortness of breath and wheezing.   Cardiovascular: Negative for chest pain and palpitations.  Gastrointestinal: Positive for diarrhea. Negative for abdominal pain, constipation,  nausea and vomiting.       The patient is now set up to see GI provider for chronic diarrhea.  Endocrine: Negative for cold intolerance, heat intolerance, polydipsia and polyuria.       Blood sugars doing well. Patient taking no diabetic medication at this time.   Musculoskeletal: Positive for arthralgias and back pain. Negative for joint swelling and neck pain.  Skin: Negative for rash.  Allergic/Immunologic: Negative for environmental allergies.  Neurological: Positive for numbness. Negative for tremors.       Severe burning sensation in both lower extremities.   Hematological: Negative for adenopathy. Does not bruise/bleed easily.  Psychiatric/Behavioral: Positive for dysphoric mood. Negative for behavioral problems (Depression), sleep disturbance and suicidal ideas. The patient is nervous/anxious.     Today's Vitals   04/03/20 0905  BP: (!) 131/56  Pulse: 66  Resp: 16  Temp: (!) 97.5 F (36.4 C)  SpO2: 96%  Weight:  204 lb 3.2 oz (92.6 kg)  Height: 5\' 4"  (1.626 m)   Body mass index is 35.05 kg/m.  Physical Exam Vitals and nursing note reviewed.  Constitutional:      General: She is not in acute distress.    Appearance: Normal appearance. She is well-developed. She is not diaphoretic.  HENT:     Head: Normocephalic and atraumatic.     Mouth/Throat:     Pharynx: No oropharyngeal exudate.  Eyes:     Pupils: Pupils are equal, round, and reactive to light.  Neck:     Thyroid: No thyromegaly.     Vascular: No carotid bruit or JVD.     Trachea: No tracheal deviation.  Cardiovascular:     Rate and Rhythm: Normal rate and regular rhythm.     Pulses: Normal pulses.     Heart sounds: Normal heart sounds. No murmur heard. No friction rub. No gallop.   Pulmonary:     Effort: Pulmonary effort is normal. No respiratory distress.     Breath sounds: Normal breath sounds. No wheezing or rales.  Chest:     Chest wall: No tenderness.  Abdominal:     General: Bowel sounds are  increased.     Palpations: Abdomen is soft.     Tenderness: There is no abdominal tenderness.  Musculoskeletal:        General: Normal range of motion.     Cervical back: Normal range of motion and neck supple.  Lymphadenopathy:     Cervical: No cervical adenopathy.  Skin:    General: Skin is warm and dry.  Neurological:     General: No focal deficit present.     Mental Status: She is alert and oriented to person, place, and time.     Cranial Nerves: No cranial nerve deficit.  Psychiatric:        Attention and Perception: Attention and perception normal.        Mood and Affect: Mood normal. Affect is tearful.        Speech: Speech normal.        Behavior: Behavior normal. Behavior is cooperative.        Thought Content: Thought content normal.        Cognition and Memory: Cognition and memory normal.        Judgment: Judgment normal.    Assessment/Plan: 1. Intermittent claudication ([place]) Reviewed arterial ultrasound of bilateral lower extremities. There is diffuse atherosclerosis throughout the extremities without evidence of stenosis. Start pletal at 50mg  twice daily. Encouraged use of support stocking while up and on feet. Reassess at next visit.   2. Peripheral neuropathic pain Start pletal 50mg  twice daily. May continue hydrocodone/APAP 10/325mg  at night only when needed for severe pain. Single 30 day prescription sent to her pharmacy today.   3. Irritable bowel syndrome with diarrhea Follow up with GI provider as scheduled.   4. Essential hypertension Stable. Continue bp medication as prescribed   5. Aortic atherosclerosis ([place]) Reviewed results of carotid doppler. She has mild plaque with <5% stenosis in bilateral carotid arteries. Continue rosuvastatin as prescribed. Will monitor closely.   General Counseling: Richa verbalizes understanding of the findings of todays visit and agrees with plan of treatment. I have discussed any further diagnostic evaluation that may be  needed or ordered today. We also reviewed her medications today. she has been encouraged to call the office with any questions or concerns that should arise related to todays visit.   This patient was seen  by Leretha Pol FNP Collaboration with Dr Lavera Guise as a part of collaborative care agreement  Meds ordered this encounter  Medications  . DISCONTD: cilostazol (PLETAL) 50 MG tablet    Sig: Take 1 tablet (50 mg total) by mouth 2 (two) times daily.    Dispense:  60 tablet    Refill:  1    Order Specific Question:   Supervising Provider    Answer:   Lavera Guise [[id number]]  . DISCONTD: HYDROcodone-acetaminophen (NORCO) 10-325 MG tablet    Sig: Take 1 tablet by mouth daily as needed. For acute and severe pain    Dispense:  30 tablet    Refill:  0    Fill after 04/17/2020    Order Specific Question:   Supervising Provider    Answer:   Lavera Guise [[id number]]    Total time spent: 30 Minutes   Time spent includes review of chart, medications, test results, and follow up plan with the patient.      Dr Lavera Guise Internal medicine

## 2020-04-06 NOTE — Progress Notes (Signed)
Diffuse atherosclerosis throughout arteries of bilateral lower extremities. Started on pletal at time of visit. Reviewed with patient.

## 2020-04-09 ENCOUNTER — Other Ambulatory Visit: Payer: Self-pay

## 2020-04-09 ENCOUNTER — Ambulatory Visit (INDEPENDENT_AMBULATORY_CARE_PROVIDER_SITE_OTHER): Payer: BC Managed Care – PPO | Admitting: Gastroenterology

## 2020-04-09 ENCOUNTER — Encounter: Payer: Self-pay | Admitting: Gastroenterology

## 2020-04-09 VITALS — BP 131/68 | HR 88 | Ht 64.0 in | Wt 205.4 lb

## 2020-04-09 DIAGNOSIS — R300 Dysuria: Secondary | ICD-10-CM | POA: Diagnosis not present

## 2020-04-09 DIAGNOSIS — R197 Diarrhea, unspecified: Secondary | ICD-10-CM | POA: Diagnosis not present

## 2020-04-09 NOTE — Progress Notes (Addendum)
Gastroenterology Consultation  Referring Provider:     Ronnell Freshwater, NP Primary Care Physician:  Lavera Guise, MD Primary Gastroenterologist:  Dr. Allen Norris     Reason for Consultation:     IBS        HPI:   Wendy Hubbard is a 64 y.o. y/o female referred for consultation & management of irritable bowel syndrome by Dr. Humphrey Rolls, Timoteo Gaul, MD.  This patient comes in with a history of seeing Dr. Vira Agar in the past for irritable bowel syndrome.  The patient is now coming to me for transfer of care.  The patient had an upper endoscopy and colonoscopy back in 2017 during the month of June.  At that time the patient had internal hemorrhoids and gastritis. Since the patient's last upper endoscopy she has undergone gastric bypass surgery.  The patient states she lost a significant amount of weight but gained some weight back while being homebound during Covid.  She states she did eat a lot but she ate small portions throughout the day.  The patient also reports that she has been having some worsening of her diarrhea.  The patient has been treated with clindamycin for a urinary tract infection and has had a cystoscopy.  The patient states that her urine is dark and smells very badly at the present time.  She has had accidents at work where she has had to go home after soiling herself without even knowing it.  She then will have some intermittent constipation.  The patient has a history of C. 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  The donor for her stool transplant with her husband.  Past Medical History:  Diagnosis Date  . COPD (chronic obstructive pulmonary disease) ([place])   . GERD (gastroesophageal reflux disease)   . Hyperlipidemia   . Hypertension   . Rapid heart rate   . Sleep apnea     Past Surgical History:  Procedure Laterality Date  . ABDOMINAL HYSTERECTOMY    . ANKLE ARTHROSCOPY Bilateral   . APPENDECTOMY    . BLADDER SUSPENSION    . BREAST EXCISIONAL BIOPSY  Left 1990   neg  . BREAST EXCISIONAL BIOPSY Right 1989   neg  . BREAST SURGERY    . CHOLECYSTECTOMY    . COLONOSCOPY N/A 11/04/2015   Procedure: COLONOSCOPY;  Surgeon: Manya Silvas, MD;  Location: [facility] ENDOSCOPY;  Service: Endoscopy;  Laterality: N/A;  . ESOPHAGOGASTRODUODENOSCOPY N/A 11/02/2015   Procedure: ESOPHAGOGASTRODUODENOSCOPY (EGD);  Surgeon: Manya Silvas, MD;  Location: [facility] ENDOSCOPY;  Service: Endoscopy;  Laterality: N/A;  . KNEE ARTHROSCOPY Left   . REDUCTION MAMMAPLASTY Bilateral 1988  . TONSILLECTOMY      Prior to Admission medications   Medication Sig Start Date End Date Taking? Authorizing Provider  ALPRAZolam Duanne Moron) 0.5 MG tablet Take half to one tab a day prn only for acute anxiety or panic attack 02/29/20   Lavera Guise, MD  butalbital-acetaminophen-caffeine [facility]) [phone] MG tablet Take 1-2 tablets by mouth every 6 (six) hours as needed for headache. 12/22/19 12/21/20  Ronnell Freshwater, NP  cilostazol (PLETAL) 50 MG tablet Take 1 tablet (50 mg total) by mouth 2 (two) times daily. 04/03/20   Ronnell Freshwater, NP  diphenoxylate-atropine (LOMOTIL) 2.5-0.025 MG tablet Take 1 tablet by mouth 4 (four) times daily as needed for diarrhea or loose stools. 12/08/19   Ronnell Freshwater, NP  escitalopram (LEXAPRO) 20 MG tablet Take  1 tablet (20 mg total) by mouth daily. 02/06/20   Lavera Guise, MD  furosemide (LASIX) 20 MG tablet Take 1 tablet (20 mg total) by mouth daily. Take once a day as needed edema 03/28/20   Ronnell Freshwater, NP  gabapentin (NEURONTIN) 600 MG tablet Take one AM, one after lunch, and 2 QHS Patient taking differently: Take 600-1,200 mg by mouth 3 (three) times daily. 600MG -AM 600MG -NOON 1,200MG -PM 11/08/19   Hyatt, Max T, DPM  HYDROcodone-acetaminophen (NORCO) 10-325 MG tablet Take 1 tablet by mouth daily as needed. For acute and severe pain 04/03/20   Ronnell Freshwater, NP  metoprolol succinate (TOPROL-XL) 50 MG 24 hr tablet Take 1 tablet (50  mg total) by mouth at bedtime. Take with or immediately following a meal. 12/20/19   Boscia, Greer Ee, NP  mirtazapine (REMERON) 15 MG tablet Take 1 tablet (15 mg total) by mouth at bedtime. For sleep 02/06/20   Lavera Guise, MD  montelukast (SINGULAIR) 10 MG tablet Take 1 tablet (10 mg total) by mouth at bedtime. 01/29/20   Ronnell Freshwater, NP  pantoprazole (PROTONIX) 40 MG tablet Take one tablet by mouth daily. Patient taking differently: Take 40 mg by mouth daily. Take one tablet by mouth daily. 01/29/20   Ronnell Freshwater, NP  rosuvastatin (CRESTOR) 10 MG tablet Take 1 tablet (10 mg total) by mouth daily. Patient taking differently: Take 10 mg by mouth at bedtime.  11/15/19   Ronnell Freshwater, NP  tiZANidine (ZANAFLEX) 4 MG tablet Take 1 tablet po BID prn muscle pain/spasms. Patient taking differently: Take 4 mg by mouth every 6 (six) hours as needed.  01/16/20   Ronnell Freshwater, NP    Family History  Problem Relation Age of Onset  . Hypertension Other   . Bladder Cancer Mother   . Kidney cancer Neg Hx   . Prostate cancer Neg Hx   . Breast cancer Neg Hx      Social History   Tobacco Use  . Smoking status: Former Research scientist (life sciences)  . Smokeless tobacco: Never Used  . Tobacco comment: quit in 1996  Vaping Use  . Vaping Use: Never used  Substance Use Topics  . Alcohol use: Not Currently    Alcohol/week: 0.0 standard drinks    Comment: occasional  . Drug use: No    Allergies as of 04/09/2020 - Review Complete 04/03/2020  Allergen Reaction Noted  . Flagyl [metronidazole] Anaphylaxis and Rash 08/22/2014  . Penicillins Anaphylaxis and Other (See Comments) 08/22/2014  . Doxycycline Hives 08/22/2014    Review of Systems:    All systems reviewed and negative except where noted in HPI.   Physical Exam:  There were no vitals taken for this visit. No LMP recorded. Patient has had a hysterectomy. General:   Alert,  Well-developed, well-nourished, pleasant and cooperative in NAD Head:   Normocephalic and atraumatic. Eyes:  Sclera clear, no icterus.   Conjunctiva pink. Ears:  Normal auditory acuity. Neck:  Supple; no masses or thyromegaly. Lungs:  Respirations even and unlabored.  Clear throughout to auscultation.   No wheezes, crackles, or rhonchi. No acute distress. Heart:  Regular rate and rhythm; no murmurs, clicks, rubs, or gallops. Abdomen:  Normal bowel sounds.  No bruits.  Soft, non-tender and non-distended without masses, hepatosplenomegaly or hernias noted.  No guarding or rebound tenderness.  Negative Carnett sign.   Rectal:  Deferred.  Pulses:  Normal pulses noted. Extremities:  No clubbing or edema.  No cyanosis. Neurologic:  Alert and oriented x3;  grossly normal neurologically. Skin:  Intact without significant lesions or rashes.  No jaundice. Lymph Nodes:  No significant cervical adenopathy. Psych:  Alert and cooperative. Normal mood and affect.  Imaging Studies: No results found.  Assessment and Plan:   Wendy Hubbard is a 64 y.o. y/o female who comes in with a history of urinary tract infections and diarrhea.  The patient has a past medical history of C. difficile colitis and states that she did not even know she had it at the time because she was having intermittent diarrhea.  The patient has had diarrhea with soiling herself at work.  The patient will be started on Citrucel twice a day to try and bulk up her stools while getting urinalysis to rule out urinary tract infection due to her dark and foul-smelling urine.  The patient will also have stool studies sent off to look for any pathogens as a cause of her diarrhea.  The patient will continue the Lomotil which she has been given by her primary care provider and will also take Imodium as needed.  The patient has been explained the plan and agrees with it.    Lucilla Lame, MD. Marval Regal    Note: This dictation was prepared with Dragon dictation along with smaller phrase technology. Any transcriptional errors  that result from this process are unintentional.

## 2020-04-10 ENCOUNTER — Telehealth: Payer: Self-pay

## 2020-04-10 ENCOUNTER — Other Ambulatory Visit: Payer: Self-pay | Admitting: Nurse Practitioner

## 2020-04-10 DIAGNOSIS — N390 Urinary tract infection, site not specified: Secondary | ICD-10-CM

## 2020-04-10 LAB — URINALYSIS, ROUTINE W REFLEX MICROSCOPIC
Bilirubin, UA: NEGATIVE
Glucose, UA: NEGATIVE
Ketones, UA: NEGATIVE
Nitrite, UA: NEGATIVE
Protein,UA: NEGATIVE
RBC, UA: NEGATIVE
Specific Gravity, UA: 1.018 (ref 1.005–1.030)
Urobilinogen, Ur: 1 mg/dL (ref 0.2–1.0)
pH, UA: 7.5 (ref 5.0–7.5)

## 2020-04-10 LAB — MICROSCOPIC EXAMINATION: Casts: NONE SEEN /lpf

## 2020-04-10 MED ORDER — CEPHALEXIN 500 MG PO CAPS: 500.0000 mg | ORAL_CAPSULE | Freq: Three times a day (TID) | ORAL | 0 refills | Status: DC

## 2020-04-10 NOTE — Telephone Encounter (Signed)
There was presence of white blood cells and blood in the urine. Are they treating her?

## 2020-04-10 NOTE — Telephone Encounter (Signed)
Sent keflex 500mg  three times daily for next 7 days.

## 2020-04-10 NOTE — Telephone Encounter (Signed)
Pt can take levaquin or keflex for UTI.  Please send one to [facility]

## 2020-04-12 DIAGNOSIS — R3121 Asymptomatic microscopic hematuria: Secondary | ICD-10-CM | POA: Diagnosis not present

## 2020-04-12 DIAGNOSIS — N3946 Mixed incontinence: Secondary | ICD-10-CM | POA: Diagnosis not present

## 2020-04-12 DIAGNOSIS — R350 Frequency of micturition: Secondary | ICD-10-CM | POA: Diagnosis not present

## 2020-04-17 ENCOUNTER — Other Ambulatory Visit: Payer: Self-pay | Admitting: Nurse Practitioner

## 2020-04-17 ENCOUNTER — Telehealth: Payer: Self-pay

## 2020-04-17 DIAGNOSIS — R197 Diarrhea, unspecified: Secondary | ICD-10-CM | POA: Diagnosis not present

## 2020-04-17 DIAGNOSIS — R112 Nausea with vomiting, unspecified: Secondary | ICD-10-CM

## 2020-04-17 DIAGNOSIS — R300 Dysuria: Secondary | ICD-10-CM | POA: Diagnosis not present

## 2020-04-17 MED ORDER — ONDANSETRON 4 MG PO TBDP: ORAL_TABLET | ORAL | 1 refills | Status: DC

## 2020-04-17 NOTE — Telephone Encounter (Signed)
Pt.notified

## 2020-04-17 NOTE — Telephone Encounter (Signed)
I sent her orally disintegrating sofran 4mg . She can take 1 to 2 tablets up to three times daily as needed for nausea.

## 2020-04-17 NOTE — Telephone Encounter (Signed)
Pt notified of results and I have faxed to Dr. Clayborn Bigness.

## 2020-04-17 NOTE — Telephone Encounter (Signed)
-----   Message from Lucilla Lame, MD sent at 04/15/2020  1:02 PM EST ----- Let the patient know that the urinalysis results were equivocal and I will send them onto her primary care provider.  Please fax these results to Dr. Clayborn Bigness

## 2020-04-19 ENCOUNTER — Other Ambulatory Visit: Payer: Self-pay

## 2020-04-19 MED ORDER — FUROSEMIDE 20 MG PO TABS
20.0000 mg | ORAL_TABLET | Freq: Every day | ORAL | 0 refills | Status: DC
Start: 2020-04-19 — End: 2020-05-31

## 2020-04-20 LAB — GI PROFILE, STOOL, PCR

## 2020-04-20 LAB — CLOSTRIDIUM DIFFICILE BY PCR: Toxigenic C. Difficile by PCR: NEGATIVE

## 2020-04-23 ENCOUNTER — Telehealth: Payer: Self-pay

## 2020-04-23 NOTE — Telephone Encounter (Signed)
-----   Message from Lucilla Lame, MD sent at 04/23/2020  3:58 PM EST ----- Let the patient know that all the stool tests were negative for any infection.

## 2020-04-23 NOTE — Telephone Encounter (Signed)
Pt notified of lab results via mychart.

## 2020-04-25 ENCOUNTER — Ambulatory Visit: Payer: BC Managed Care – PPO | Admitting: Internal Medicine

## 2020-04-30 ENCOUNTER — Other Ambulatory Visit: Payer: Self-pay

## 2020-04-30 MED ORDER — PANTOPRAZOLE SODIUM 40 MG PO TBEC
DELAYED_RELEASE_TABLET | ORAL | 3 refills | Status: DC
Start: 2020-04-30 — End: 2020-07-30

## 2020-05-01 DIAGNOSIS — G629 Polyneuropathy, unspecified: Secondary | ICD-10-CM | POA: Diagnosis not present

## 2020-05-01 DIAGNOSIS — R7309 Other abnormal glucose: Secondary | ICD-10-CM | POA: Diagnosis not present

## 2020-05-01 DIAGNOSIS — E519 Thiamine deficiency, unspecified: Secondary | ICD-10-CM | POA: Diagnosis not present

## 2020-05-01 DIAGNOSIS — R200 Anesthesia of skin: Secondary | ICD-10-CM | POA: Diagnosis not present

## 2020-05-01 DIAGNOSIS — E538 Deficiency of other specified B group vitamins: Secondary | ICD-10-CM | POA: Diagnosis not present

## 2020-05-01 DIAGNOSIS — E560 Deficiency of vitamin E: Secondary | ICD-10-CM | POA: Diagnosis not present

## 2020-05-01 DIAGNOSIS — E531 Pyridoxine deficiency: Secondary | ICD-10-CM | POA: Diagnosis not present

## 2020-05-01 DIAGNOSIS — R202 Paresthesia of skin: Secondary | ICD-10-CM | POA: Diagnosis not present

## 2020-05-01 DIAGNOSIS — E610 Copper deficiency: Secondary | ICD-10-CM | POA: Diagnosis not present

## 2020-05-03 DIAGNOSIS — G629 Polyneuropathy, unspecified: Secondary | ICD-10-CM | POA: Insufficient documentation

## 2020-05-07 ENCOUNTER — Other Ambulatory Visit: Payer: Self-pay

## 2020-05-07 ENCOUNTER — Encounter: Payer: Self-pay | Admitting: Nurse Practitioner

## 2020-05-07 ENCOUNTER — Ambulatory Visit: Payer: BC Managed Care – PPO | Admitting: Nurse Practitioner

## 2020-05-07 VITALS — BP 143/73 | HR 70 | Temp 97.9°F | Resp 16 | Ht 64.0 in | Wt 207.4 lb

## 2020-05-07 DIAGNOSIS — I739 Peripheral vascular disease, unspecified: Secondary | ICD-10-CM

## 2020-05-07 DIAGNOSIS — G2581 Restless legs syndrome: Secondary | ICD-10-CM | POA: Diagnosis not present

## 2020-05-07 DIAGNOSIS — M792 Neuralgia and neuritis, unspecified: Secondary | ICD-10-CM

## 2020-05-07 DIAGNOSIS — I10 Essential (primary) hypertension: Secondary | ICD-10-CM

## 2020-05-07 MED ORDER — HYDROCODONE-ACETAMINOPHEN 10-325 MG PO TABS: 1.0000 | ORAL_TABLET | Freq: Every day | ORAL | 0 refills | Status: DC | PRN

## 2020-05-07 MED ORDER — CILOSTAZOL 100 MG PO TABS: 100.0000 mg | ORAL_TABLET | Freq: Two times a day (BID) | ORAL | 2 refills | Status: DC

## 2020-05-07 MED ORDER — ROPINIROLE HCL 0.25 MG PO TABS: ORAL_TABLET | ORAL | 1 refills | Status: DC

## 2020-05-07 NOTE — Progress Notes (Signed)
[facility] [address] [place], [place] [address]  Internal MEDICINE  Office Visit Note  Patient Name: Wendy Hubbard  [id number]  [id number]  Date of Service: 05/30/2020  Chief Complaint  Patient presents with  . Follow-up    The patient is here for follow up visit. She has seen neurology due to neuropathy in the legs. She did have blood work done. Her HgbA1c was 6.1 and thiamine level was elevated. Patient will be having a nerve conduction study. This is scheduled for early In January.  Was started on pletal at her last visit. She was started on 50mg  twice daily. She states this has not helped at all. The only thing that has really helped is taking the Hydrocodone/APAP at night. She states when she doesn't take it, her legs cramp and jump all over the place.       Current Medication: Outpatient Encounter Medications as of 05/07/2020  Medication Sig  . ALPRAZolam (XANAX) 0.5 MG tablet Take half to one tab a day prn only for acute anxiety or panic attack  . butalbital-acetaminophen-caffeine (FIORICET) 50-325-40 MG tablet Take 1-2 tablets by mouth every 6 (six) hours as needed for headache.  . cephALEXin (KEFLEX) 500 MG capsule Take 1 capsule (500 mg total) by mouth 3 (three) times daily.  . diphenoxylate-atropine (LOMOTIL) 2.5-0.025 MG tablet Take 1 tablet by mouth 4 (four) times daily as needed for diarrhea or loose stools.  07-28-1989 escitalopram (LEXAPRO) 20 MG tablet Take 1 tablet (20 mg total) by mouth daily.  . furosemide (LASIX) 20 MG tablet Take 1 tablet (20 mg total) by mouth daily. Take once a day as needed edema  . gabapentin (NEURONTIN) 600 MG tablet Take one AM, one after lunch, and 2 QHS (Patient taking differently: Take 600-1,200 mg by mouth 3 (three) times daily. 600MG -AM 600MG -NOON 1,200MG -PM)  . metoprolol succinate (TOPROL-XL) 50 MG 24 hr tablet Take 1 tablet (50 mg total) by mouth at bedtime. Take with or immediately following a meal.  . mirtazapine  (REMERON) 15 MG tablet Take 1 tablet (15 mg total) by mouth at bedtime. For sleep  . montelukast (SINGULAIR) 10 MG tablet Take 1 tablet (10 mg total) by mouth at bedtime.  . ondansetron (ZOFRAN-ODT) 4 MG disintegrating tablet Take 1 to 2 tablets po TID prn nausea  . pantoprazole (PROTONIX) 40 MG tablet Take one tablet by mouth daily.  Marland Kitchen tiZANidine (ZANAFLEX) 4 MG tablet Take 1 tablet po BID prn muscle pain/spasms. (Patient taking differently: Take 4 mg by mouth every 6 (six) hours as needed.)  . [DISCONTINUED] cilostazol (PLETAL) 50 MG tablet Take 1 tablet (50 mg total) by mouth 2 (two) times daily.  . [DISCONTINUED] HYDROcodone-acetaminophen (NORCO) 10-325 MG tablet Take 1 tablet by mouth daily as needed. For acute and severe pain  . [DISCONTINUED] rOPINIRole (REQUIP) 0.25 MG tablet Take 1 tablet po QHS. Pay increase to 2 tablets po QHS as needed and as tolerated .  . [DISCONTINUED] rosuvastatin (CRESTOR) 10 MG tablet Take 1 tablet (10 mg total) by mouth daily. (Patient taking differently: Take 10 mg by mouth at bedtime.)  . cilostazol (PLETAL) 100 MG tablet Take 1 tablet (100 mg total) by mouth 2 (two) times daily.  . [DISCONTINUED] HYDROcodone-acetaminophen (NORCO) 10-325 MG tablet Take 1 tablet by mouth daily as needed. For acute and severe pain   No facility-administered encounter medications on file as of 05/07/2020.    Surgical History: Past Surgical History:  Procedure Laterality Date  . ABDOMINAL  HYSTERECTOMY    . ANKLE ARTHROSCOPY Bilateral   . APPENDECTOMY    . BLADDER SUSPENSION    . BREAST EXCISIONAL BIOPSY Left 1990   neg  . BREAST EXCISIONAL BIOPSY Right 1989   neg  . BREAST SURGERY    . CHOLECYSTECTOMY    . COLONOSCOPY N/A 11/04/2015   Procedure: COLONOSCOPY;  Surgeon: Manya Silvas, MD;  Location: [facility] ENDOSCOPY;  Service: Endoscopy;  Laterality: N/A;  . ESOPHAGOGASTRODUODENOSCOPY N/A 11/02/2015   Procedure: ESOPHAGOGASTRODUODENOSCOPY (EGD);  Surgeon: Manya Silvas, MD;  Location: [facility] ENDOSCOPY;  Service: Endoscopy;  Laterality: N/A;  . KNEE ARTHROSCOPY Left   . REDUCTION MAMMAPLASTY Bilateral 1988  . TONSILLECTOMY      Medical History: Past Medical History:  Diagnosis Date  . COPD (chronic obstructive pulmonary disease) ([place])   . GERD (gastroesophageal reflux disease)   . Hyperlipidemia   . Hypertension   . Rapid heart rate   . Sleep apnea     Family History: Family History  Problem Relation Age of Onset  . Hypertension Other   . Bladder Cancer Mother   . Kidney cancer Neg Hx   . Prostate cancer Neg Hx   . Breast cancer Neg Hx     Social History   Socioeconomic History  . Marital status: Married    Spouse name: Not on file  . Number of children: Not on file  . Years of education: Not on file  . Highest education level: Not on file  Occupational History  . Not on file  Tobacco Use  . Smoking status: Former Research scientist (life sciences)  . Smokeless tobacco: Never Used  . Tobacco comment: quit in 1996  Vaping Use  . Vaping Use: Never used  Substance and Sexual Activity  . Alcohol use: Not Currently    Alcohol/week: 0.0 standard drinks    Comment: occasional  . Drug use: No  . Sexual activity: Not on file  Other Topics Concern  . Not on file  Social History Narrative  . Not on file   Social Determinants of Health   Financial Resource Strain: Not on file  Food Insecurity: Not on file  Transportation Needs: Not on file  Physical Activity: Not on file  Stress: Not on file  Social Connections: Not on file  Intimate Partner Violence: Not on file      Review of Systems  Constitutional: Positive for fatigue. Negative for activity change, appetite change, chills and unexpected weight change.  HENT: Negative for congestion, postnasal drip, rhinorrhea, sneezing and sore throat.   Respiratory: Negative for cough, chest tightness, shortness of breath and wheezing.   Cardiovascular: Negative for chest pain and palpitations.   Gastrointestinal: Positive for diarrhea. Negative for abdominal pain, constipation, nausea and vomiting.  Endocrine: Negative for cold intolerance, heat intolerance, polydipsia and polyuria.       Blood sugars doing well. Patient taking no diabetic medication at this time. HgbA1c was checked with labs per neurology. It was 6.1.   Musculoskeletal: Positive for arthralgias and back pain. Negative for joint swelling and neck pain.       Severe bilateral lower leg pain, especially at night.   Skin: Negative for rash.  Allergic/Immunologic: Negative for environmental allergies.  Neurological: Positive for numbness. Negative for tremors.       Severe burning sensation in both lower extremities.   Hematological: Negative for adenopathy. Does not bruise/bleed easily.  Psychiatric/Behavioral: Positive for dysphoric mood. Negative for behavioral problems (Depression), sleep disturbance and suicidal ideas. The  patient is nervous/anxious.     Today's Vitals   05/07/20 1027  BP: (!) 143/73  Pulse: 70  Resp: 16  Temp: 97.9 F (36.6 C)  SpO2: 98%  Weight: 207 lb 6.4 oz (94.1 kg)  Height: 5\' 4"  (1.626 m)   Body mass index is 35.6 kg/m.  Physical Exam Vitals and nursing note reviewed.  Constitutional:      General: She is not in acute distress.    Appearance: Normal appearance. She is well-developed. She is not diaphoretic.  HENT:     Head: Normocephalic and atraumatic.     Mouth/Throat:     Pharynx: No oropharyngeal exudate.  Eyes:     Pupils: Pupils are equal, round, and reactive to light.  Neck:     Thyroid: No thyromegaly.     Vascular: No carotid bruit or JVD.     Trachea: No tracheal deviation.  Cardiovascular:     Rate and Rhythm: Normal rate and regular rhythm.     Pulses: Normal pulses.     Heart sounds: Normal heart sounds. No murmur heard. No friction rub. No gallop.   Pulmonary:     Effort: Pulmonary effort is normal. No respiratory distress.     Breath sounds: Normal  breath sounds. No wheezing or rales.  Chest:     Chest wall: No tenderness.  Abdominal:     General: Bowel sounds are increased.     Palpations: Abdomen is soft.     Tenderness: There is no abdominal tenderness.  Musculoskeletal:        General: Normal range of motion.     Cervical back: Normal range of motion and neck supple.  Lymphadenopathy:     Cervical: No cervical adenopathy.  Skin:    General: Skin is warm and dry.  Neurological:     General: No focal deficit present.     Mental Status: She is alert and oriented to person, place, and time.     Cranial Nerves: No cranial nerve deficit.  Psychiatric:        Attention and Perception: Attention and perception normal.        Mood and Affect: Mood normal. Affect is tearful.        Speech: Speech normal.        Behavior: Behavior normal. Behavior is cooperative.        Thought Content: Thought content normal.        Cognition and Memory: Cognition and memory normal.        Judgment: Judgment normal.    Assessment/Plan: 1. Intermittent claudication (HCC) Will increase pletal to 100mg  twice daily.  - cilostazol (PLETAL) 100 MG tablet; Take 1 tablet (100 mg total) by mouth 2 (two) times daily.  Dispense: 60 tablet; Refill: 2  2. Peripheral neuropathic pain This is most severe at night. May take hydrocodone/APAP 10/325mg  at bedtime when needed for severe pain. She is having further evaluation per neurology.   3. Restless legs syndrome Trial requip 0.25mg  at bedtime. Will increase/titrate dosing as indicated.   4. Essential hypertension Stable. Continue bp medication as prescribed   General Counseling: [place] verbalizes understanding of the findings of todays visit and agrees with plan of treatment. I have discussed any further diagnostic evaluation that may be needed or ordered today. We also reviewed her medications today. she has been encouraged to call the office with any questions or concerns that should arise related to  todays visit.   This patient was seen by Leretha Pol  FNP Collaboration with Dr Lavera Guise as a part of collaborative care agreement  Meds ordered this encounter  Medications  . DISCONTD: HYDROcodone-acetaminophen (NORCO) 10-325 MG tablet    Sig: Take 1 tablet by mouth daily as needed. For acute and severe pain    Dispense:  30 tablet    Refill:  0    Fill after 05/18/2020    Order Specific Question:   Supervising Provider    Answer:   Lavera Guise [place]  . cilostazol (PLETAL) 100 MG tablet    Sig: Take 1 tablet (100 mg total) by mouth 2 (two) times daily.    Dispense:  60 tablet    Refill:  2    Order Specific Question:   Supervising Provider    Answer:   Lavera Guise [id number]  . DISCONTD: rOPINIRole (REQUIP) 0.25 MG tablet    Sig: Take 1 tablet po QHS. Pay increase to 2 tablets po QHS as needed and as tolerated .    Dispense:  60 tablet    Refill:  1    Order Specific Question:   Supervising Provider    Answer:   Lavera Guise [id number]    Total time spent: 30 Minutes   Time spent includes review of chart, medications, test results, and follow up plan with the patient.      Dr Lavera Guise Internal medicine

## 2020-05-08 DIAGNOSIS — I700 Atherosclerosis of aorta: Secondary | ICD-10-CM | POA: Insufficient documentation

## 2020-05-08 DIAGNOSIS — I739 Peripheral vascular disease, unspecified: Secondary | ICD-10-CM | POA: Insufficient documentation

## 2020-05-20 ENCOUNTER — Other Ambulatory Visit: Payer: Self-pay | Admitting: Internal Medicine

## 2020-05-22 ENCOUNTER — Ambulatory Visit: Payer: BC Managed Care – PPO | Admitting: Hospice and Palliative Medicine

## 2020-05-24 ENCOUNTER — Encounter: Payer: Self-pay | Admitting: Nurse Practitioner

## 2020-05-24 ENCOUNTER — Other Ambulatory Visit: Payer: Self-pay

## 2020-05-24 ENCOUNTER — Ambulatory Visit: Payer: BC Managed Care – PPO | Admitting: Nurse Practitioner

## 2020-05-24 VITALS — BP 124/68 | HR 89 | Temp 97.4°F | Resp 16 | Ht 64.0 in | Wt 206.0 lb

## 2020-05-24 DIAGNOSIS — E782 Mixed hyperlipidemia: Secondary | ICD-10-CM | POA: Diagnosis not present

## 2020-05-24 DIAGNOSIS — G2581 Restless legs syndrome: Secondary | ICD-10-CM

## 2020-05-24 DIAGNOSIS — M792 Neuralgia and neuritis, unspecified: Secondary | ICD-10-CM | POA: Diagnosis not present

## 2020-05-24 DIAGNOSIS — I10 Essential (primary) hypertension: Secondary | ICD-10-CM

## 2020-05-24 MED ORDER — ROPINIROLE HCL 0.5 MG PO TABS: ORAL_TABLET | ORAL | 1 refills | Status: DC

## 2020-05-24 MED ORDER — ROSUVASTATIN CALCIUM 10 MG PO TABS: 10.0000 mg | ORAL_TABLET | Freq: Every day | ORAL | 1 refills | Status: DC

## 2020-05-24 MED ORDER — HYDROCODONE-ACETAMINOPHEN 10-325 MG PO TABS: 1.0000 | ORAL_TABLET | Freq: Every day | ORAL | 0 refills | Status: DC | PRN

## 2020-05-24 NOTE — Progress Notes (Signed)
[facility] [place], [place] [address]  Internal MEDICINE  Office Visit Note  Patient Name: Wendy Hubbard  [id number]  [id number]  Date of Service: 06/01/2020  Chief Complaint  Patient presents with  . Follow-up  . Gastroesophageal Reflux  . Hyperlipidemia  . Hypertension  . Medication Refill    Patient here for routine follow up. -started on requip 0.25mg  at bedtime. Has not helped much but she does notice a little improvement in bilateral lower leg pain.  -continues to take hydrocodone/apap 10/325mg  tablets if needed for severe pain at night. She does need to have a new prescription for this today.  -blood pressure well managed.  -blood sugars doing well without use of any medicaitons.       Current Medication: Outpatient Encounter Medications as of 05/24/2020  Medication Sig  . ALPRAZolam (XANAX) 0.5 MG tablet Take half to one tab a day prn only for acute anxiety or panic attack  . butalbital-acetaminophen-caffeine (FIORICET) 50-325-40 MG tablet Take 1-2 tablets by mouth every 6 (six) hours as needed for headache.  . cephALEXin (KEFLEX) 500 MG capsule Take 1 capsule (500 mg total) by mouth 3 (three) times daily.  . cilostazol (PLETAL) 100 MG tablet Take 1 tablet (100 mg total) by mouth 2 (two) times daily.  . diphenoxylate-atropine (LOMOTIL) 2.5-0.025 MG tablet Take 1 tablet by mouth 4 (four) times daily as needed for diarrhea or loose stools.  Marland Kitchen escitalopram (LEXAPRO) 10 MG tablet TAKE ONE TAB BY MOUTH TWICE A DAY FOR DEPRESSION  . escitalopram (LEXAPRO) 20 MG tablet Take 1 tablet (20 mg total) by mouth daily.  Marland Kitchen gabapentin (NEURONTIN) 600 MG tablet Take one AM, one after lunch, and 2 QHS (Patient taking differently: Take 600-1,200 mg by mouth 3 (three) times daily. 600MG -AM 600MG -NOON 1,200MG -PM)  . metoprolol succinate (TOPROL-XL) 50 MG 24 hr tablet Take 1 tablet (50 mg total) by mouth at bedtime. Take with or immediately following a meal.  .  mirtazapine (REMERON) 15 MG tablet Take 1 tablet (15 mg total) by mouth at bedtime. For sleep  . montelukast (SINGULAIR) 10 MG tablet Take 1 tablet (10 mg total) by mouth at bedtime.  . ondansetron (ZOFRAN-ODT) 4 MG disintegrating tablet Take 1 to 2 tablets po TID prn nausea  . pantoprazole (PROTONIX) 40 MG tablet Take one tablet by mouth daily.  Marland Kitchen tiZANidine (ZANAFLEX) 4 MG tablet Take 1 tablet po BID prn muscle pain/spasms. (Patient taking differently: Take 4 mg by mouth every 6 (six) hours as needed.)  . [DISCONTINUED] furosemide (LASIX) 20 MG tablet Take 1 tablet (20 mg total) by mouth daily. Take once a day as needed edema  . [DISCONTINUED] HYDROcodone-acetaminophen (NORCO) 10-325 MG tablet Take 1 tablet by mouth daily as needed. For acute and severe pain  . [DISCONTINUED] rOPINIRole (REQUIP) 0.25 MG tablet Take 1 tablet po QHS. Pay increase to 2 tablets po QHS as needed and as tolerated .  . [DISCONTINUED] rosuvastatin (CRESTOR) 10 MG tablet Take 1 tablet (10 mg total) by mouth daily. (Patient taking differently: Take 10 mg by mouth at bedtime.)  . HYDROcodone-acetaminophen (NORCO) 10-325 MG tablet Take 1 tablet by mouth daily as needed. For acute and severe pain  . rOPINIRole (REQUIP) 0.5 MG tablet Take 1 tablet po QHS. Pay increase to 2 tablets po QHS as needed and as tolerated .  . rosuvastatin (CRESTOR) 10 MG tablet Take 1 tablet (10 mg total) by mouth daily.   No facility-administered encounter medications on  file as of 05/24/2020.    Surgical History: Past Surgical History:  Procedure Laterality Date  . ABDOMINAL HYSTERECTOMY    . ANKLE ARTHROSCOPY Bilateral   . APPENDECTOMY    . BLADDER SUSPENSION    . BREAST EXCISIONAL BIOPSY Left 1990   neg  . BREAST EXCISIONAL BIOPSY Right 1989   neg  . BREAST SURGERY    . CHOLECYSTECTOMY    . COLONOSCOPY N/A 11/04/2015   Procedure: COLONOSCOPY;  Surgeon: Manya Silvas, MD;  Location: [facility] ENDOSCOPY;  Service: Endoscopy;  Laterality:  N/A;  . ESOPHAGOGASTRODUODENOSCOPY N/A 11/02/2015   Procedure: ESOPHAGOGASTRODUODENOSCOPY (EGD);  Surgeon: Manya Silvas, MD;  Location: [facility] ENDOSCOPY;  Service: Endoscopy;  Laterality: N/A;  . KNEE ARTHROSCOPY Left   . REDUCTION MAMMAPLASTY Bilateral 1988  . TONSILLECTOMY      Medical History: Past Medical History:  Diagnosis Date  . COPD (chronic obstructive pulmonary disease) ([place])   . GERD (gastroesophageal reflux disease)   . Hyperlipidemia   . Hypertension   . Rapid heart rate   . Sleep apnea     Family History: Family History  Problem Relation Age of Onset  . Hypertension Other   . Bladder Cancer Mother   . Kidney cancer Neg Hx   . Prostate cancer Neg Hx   . Breast cancer Neg Hx     Social History   Socioeconomic History  . Marital status: Married    Spouse name: Not on file  . Number of children: Not on file  . Years of education: Not on file  . Highest education level: Not on file  Occupational History  . Not on file  Tobacco Use  . Smoking status: Former Research scientist (life sciences)  . Smokeless tobacco: Never Used  . Tobacco comment: quit in 1996  Vaping Use  . Vaping Use: Never used  Substance and Sexual Activity  . Alcohol use: Not Currently    Alcohol/week: 0.0 standard drinks    Comment: occasional  . Drug use: No  . Sexual activity: Not on file  Other Topics Concern  . Not on file  Social History Narrative  . Not on file   Social Determinants of Health   Financial Resource Strain: Not on file  Food Insecurity: Not on file  Transportation Needs: Not on file  Physical Activity: Not on file  Stress: Not on file  Social Connections: Not on file  Intimate Partner Violence: Not on file      Review of Systems  Constitutional: Positive for fatigue. Negative for activity change, appetite change, chills and unexpected weight change.  HENT: Negative for congestion, postnasal drip, rhinorrhea, sneezing and sore throat.   Respiratory: Negative for cough, chest  tightness, shortness of breath and wheezing.   Cardiovascular: Negative for chest pain and palpitations.  Gastrointestinal: Negative for abdominal pain, constipation, diarrhea, nausea and vomiting.  Endocrine: Negative for cold intolerance, heat intolerance, polydipsia and polyuria.       Blood sugars doing well. Patient taking no diabetic medication at this time. HgbA1c was checked with labs per neurology. It was 6.1.   Musculoskeletal: Positive for arthralgias and back pain. Negative for joint swelling and neck pain.       Severe bilateral lower leg pain, especially at night.   Skin: Negative for rash.  Allergic/Immunologic: Negative for environmental allergies.  Neurological: Positive for numbness. Negative for tremors.       Severe burning sensation in both lower extremities.   Hematological: Negative for adenopathy. Does not bruise/bleed easily.  Psychiatric/Behavioral: Positive for dysphoric mood. Negative for behavioral problems (Depression), sleep disturbance and suicidal ideas. The patient is nervous/anxious.     Today's Vitals   05/24/20 1132  BP: 124/68  Pulse: 89  Resp: 16  Temp: (!) 97.4 F (36.3 C)  SpO2: 96%  Weight: 206 lb (93.4 kg)  Height: 5\' 4"  (1.626 m)   Body mass index is 35.36 kg/m.  Physical Exam Vitals and nursing note reviewed.  Constitutional:      General: She is not in acute distress.    Appearance: Normal appearance. She is well-developed. She is not diaphoretic.  HENT:     Head: Normocephalic and atraumatic.     Mouth/Throat:     Pharynx: No oropharyngeal exudate.  Eyes:     Pupils: Pupils are equal, round, and reactive to light.  Neck:     Thyroid: No thyromegaly.     Vascular: No carotid bruit or JVD.     Trachea: No tracheal deviation.  Cardiovascular:     Rate and Rhythm: Normal rate and regular rhythm.     Heart sounds: Normal heart sounds. No murmur heard. No friction rub. No gallop.   Pulmonary:     Effort: Pulmonary effort is  normal. No respiratory distress.     Breath sounds: Normal breath sounds. No wheezing or rales.  Chest:     Chest wall: No tenderness.  Abdominal:     General: Bowel sounds are increased.     Palpations: Abdomen is soft.     Tenderness: There is no abdominal tenderness.  Musculoskeletal:        General: Normal range of motion.     Cervical back: Normal range of motion and neck supple.  Lymphadenopathy:     Cervical: No cervical adenopathy.  Skin:    General: Skin is warm and dry.  Neurological:     General: No focal deficit present.     Mental Status: She is alert and oriented to person, place, and time.     Cranial Nerves: No cranial nerve deficit.  Psychiatric:        Attention and Perception: Attention and perception normal.        Mood and Affect: Mood normal. Affect is tearful.        Speech: Speech normal.        Behavior: Behavior normal. Behavior is cooperative.        Thought Content: Thought content normal.        Cognition and Memory: Cognition and memory normal.        Judgment: Judgment normal.    Assessment/Plan: 1. Essential hypertension Stable. Continue bp medication as prescribd   2. Restless legs syndrome Increase requip to 0.5mg  tablets at bedtime. May increase to two tablets at bedtime as needed and as indicated.  - rOPINIRole (REQUIP) 0.5 MG tablet; Take 1 tablet po QHS. Pay increase to 2 tablets po QHS as needed and as tolerated .  Dispense: 180 tablet; Refill: 1  3. Mixed hyperlipidemia Continue rosuvastatin as prescribed  - rosuvastatin (CRESTOR) 10 MG tablet; Take 1 tablet (10 mg total) by mouth daily.  Dispense: 90 tablet; Refill: 1  4. Peripheral neuropathic pain May take hydrocodone/APAP 10/325mg  at bedtime as needed for severe pain. Patient will plan to try to wean off nightly dose of this medication gradually. Single prescription for #30 tablets sent to her pharmacy.  - HYDROcodone-acetaminophen (NORCO) 10-325 MG tablet; Take 1 tablet by mouth  daily as needed. For acute and severe  pain  Dispense: 30 tablet; Refill: 0  General Counseling: Wendy Hubbard verbalizes understanding of the findings of todays visit and agrees with plan of treatment. I have discussed any further diagnostic evaluation that may be needed or ordered today. We also reviewed her medications today. she has been encouraged to call the office with any questions or concerns that should arise related to todays visit.  This patient was seen by [place] with Dr Lavera Guise as a part of collaborative care agreement  Meds ordered this encounter  Medications  . rosuvastatin (CRESTOR) 10 MG tablet    Sig: Take 1 tablet (10 mg total) by mouth daily.    Dispense:  90 tablet    Refill:  1    Order Specific Question:   Supervising Provider    Answer:   Lavera Guise [id number]  . HYDROcodone-acetaminophen (NORCO) 10-325 MG tablet    Sig: Take 1 tablet by mouth daily as needed. For acute and severe pain    Dispense:  30 tablet    Refill:  0    Fill after 05/18/2020    Order Specific Question:   Supervising Provider    Answer:   Lavera Guise [place]  . rOPINIRole (REQUIP) 0.5 MG tablet    Sig: Take 1 tablet po QHS. Pay increase to 2 tablets po QHS as needed and as tolerated .    Dispense:  180 tablet    Refill:  1    Please note increased dose    Order Specific Question:   Supervising Provider    Answer:   Lavera Guise [id number]    Total time spent: 25 Minutes   Time spent includes review of chart, medications, test results, and follow up plan with the patient.      Dr Lavera Guise Internal medicine

## 2020-05-28 DIAGNOSIS — Z20822 Contact with and (suspected) exposure to covid-19: Secondary | ICD-10-CM | POA: Diagnosis not present

## 2020-05-30 DIAGNOSIS — G2581 Restless legs syndrome: Secondary | ICD-10-CM | POA: Insufficient documentation

## 2020-05-31 ENCOUNTER — Other Ambulatory Visit: Payer: Self-pay | Admitting: Hospice and Palliative Medicine

## 2020-05-31 ENCOUNTER — Ambulatory Visit: Payer: BC Managed Care – PPO | Admitting: Hospice and Palliative Medicine

## 2020-05-31 ENCOUNTER — Other Ambulatory Visit: Payer: Self-pay

## 2020-05-31 ENCOUNTER — Encounter: Payer: Self-pay | Admitting: Hospice and Palliative Medicine

## 2020-05-31 VITALS — Resp 16 | Ht 64.0 in | Wt 206.0 lb

## 2020-05-31 DIAGNOSIS — R059 Cough, unspecified: Secondary | ICD-10-CM

## 2020-05-31 DIAGNOSIS — R197 Diarrhea, unspecified: Secondary | ICD-10-CM | POA: Diagnosis not present

## 2020-05-31 DIAGNOSIS — Z20822 Contact with and (suspected) exposure to covid-19: Secondary | ICD-10-CM | POA: Diagnosis not present

## 2020-05-31 MED ORDER — AZITHROMYCIN 250 MG PO TABS: ORAL_TABLET | ORAL | 0 refills | Status: DC

## 2020-05-31 MED ORDER — FUROSEMIDE 20 MG PO TABS: 20.0000 mg | ORAL_TABLET | Freq: Every day | ORAL | 0 refills | Status: DC

## 2020-05-31 MED ORDER — HYDROCOD POLST-CPM POLST ER 10-8 MG/5ML PO SUER: 5.0000 mL | Freq: Every evening | ORAL | 0 refills | Status: DC | PRN

## 2020-05-31 NOTE — Progress Notes (Signed)
[facility], [place] [address]  Internal MEDICINE  Telephone Visit  Patient Name: Wendy Hubbard  [id number]  [id number]  Date of Service: 05/31/2020  I connected with the patient at 0911 by telephone and verified the patients identity using two identifiers.   I discussed the limitations, risks, security and privacy concerns of performing an evaluation and management service by telephone and the availability of in person appointments. I also discussed with the patient that there may be a patient responsible charge related to the service.  The patient expressed understanding and agrees to proceed.    Chief Complaint  Patient presents with  . telephone assesment  . Telephone Screen  . Fever  . Nasal Congestion  . Headache  . Vomiting    HPI Patient is being seen today virtually for acute sick visit She tested negative for COVID C/o fatigue, headache, diarrhea, vomiting, cough and nasal congestion She had an exposure to COVID last week, tested negative on Tuesday, symptoms started on Monday  She is not feeling well--has been unable to sleep due to coughing since Monday evening Has not been taking any over the counter medications for symptoms   Current Medication: Outpatient Encounter Medications as of 05/31/2020  Medication Sig  . azithromycin (ZITHROMAX Z-PAK) 250 MG tablet Take two 250 mg tablets on day one followed by one 250 mg tablet each day for four days.  . chlorpheniramine-HYDROcodone (TUSSIONEX PENNKINETIC ER) 10-8 MG/5ML SUER Take 5 mLs by mouth at bedtime as needed for cough.  . ALPRAZolam (XANAX) 0.5 MG tablet Take half to one tab a day prn only for acute anxiety or panic attack  . butalbital-acetaminophen-caffeine (FIORICET) 50-325-40 MG tablet Take 1-2 tablets by mouth every 6 (six) hours as needed for headache.  . cephALEXin (KEFLEX) 500 MG capsule Take 1 capsule (500 mg total) by mouth 3 (three) times daily.  . cilostazol (PLETAL)  100 MG tablet Take 1 tablet (100 mg total) by mouth 2 (two) times daily.  . diphenoxylate-atropine (LOMOTIL) 2.5-0.025 MG tablet Take 1 tablet by mouth 4 (four) times daily as needed for diarrhea or loose stools.  Marland Kitchen escitalopram (LEXAPRO) 10 MG tablet TAKE ONE TAB BY MOUTH TWICE A DAY FOR DEPRESSION  . escitalopram (LEXAPRO) 20 MG tablet Take 1 tablet (20 mg total) by mouth daily.  . furosemide (LASIX) 20 MG tablet Take 1 tablet (20 mg total) by mouth daily. Take once a day as needed edema  . gabapentin (NEURONTIN) 600 MG tablet Take one AM, one after lunch, and 2 QHS (Patient taking differently: Take 600-1,200 mg by mouth 3 (three) times daily. 600MG -AM 600MG -NOON 1,200MG -PM)  . HYDROcodone-acetaminophen (NORCO) 10-325 MG tablet Take 1 tablet by mouth daily as needed. For acute and severe pain  . metoprolol succinate (TOPROL-XL) 50 MG 24 hr tablet Take 1 tablet (50 mg total) by mouth at bedtime. Take with or immediately following a meal.  . mirtazapine (REMERON) 15 MG tablet Take 1 tablet (15 mg total) by mouth at bedtime. For sleep  . montelukast (SINGULAIR) 10 MG tablet Take 1 tablet (10 mg total) by mouth at bedtime.  . ondansetron (ZOFRAN-ODT) 4 MG disintegrating tablet Take 1 to 2 tablets po TID prn nausea  . pantoprazole (PROTONIX) 40 MG tablet Take one tablet by mouth daily.  Marland Kitchen rOPINIRole (REQUIP) 0.5 MG tablet Take 1 tablet po QHS. Pay increase to 2 tablets po QHS as needed and as tolerated .  . rosuvastatin (CRESTOR) 10 MG tablet  Take 1 tablet (10 mg total) by mouth daily.  Marland Kitchen tiZANidine (ZANAFLEX) 4 MG tablet Take 1 tablet po BID prn muscle pain/spasms. (Patient taking differently: Take 4 mg by mouth every 6 (six) hours as needed.)   No facility-administered encounter medications on file as of 05/31/2020.    Surgical History: Past Surgical History:  Procedure Laterality Date  . ABDOMINAL HYSTERECTOMY    . ANKLE ARTHROSCOPY Bilateral   . APPENDECTOMY    . BLADDER SUSPENSION    .  BREAST EXCISIONAL BIOPSY Left 1990   neg  . BREAST EXCISIONAL BIOPSY Right 1989   neg  . BREAST SURGERY    . CHOLECYSTECTOMY    . COLONOSCOPY N/A 11/04/2015   Procedure: COLONOSCOPY;  Surgeon: Manya Silvas, MD;  Location: [facility] ENDOSCOPY;  Service: Endoscopy;  Laterality: N/A;  . ESOPHAGOGASTRODUODENOSCOPY N/A 11/02/2015   Procedure: ESOPHAGOGASTRODUODENOSCOPY (EGD);  Surgeon: Manya Silvas, MD;  Location: [facility] ENDOSCOPY;  Service: Endoscopy;  Laterality: N/A;  . KNEE ARTHROSCOPY Left   . REDUCTION MAMMAPLASTY Bilateral 1988  . TONSILLECTOMY      Medical History: Past Medical History:  Diagnosis Date  . COPD (chronic obstructive pulmonary disease) ([place])   . GERD (gastroesophageal reflux disease)   . Hyperlipidemia   . Hypertension   . Rapid heart rate   . Sleep apnea     Family History: Family History  Problem Relation Age of Onset  . Hypertension Other   . Bladder Cancer Mother   . Kidney cancer Neg Hx   . Prostate cancer Neg Hx   . Breast cancer Neg Hx     Social History   Socioeconomic History  . Marital status: Married    Spouse name: Not on file  . Number of children: Not on file  . Years of education: Not on file  . Highest education level: Not on file  Occupational History  . Not on file  Tobacco Use  . Smoking status: Former Research scientist (life sciences)  . Smokeless tobacco: Never Used  . Tobacco comment: quit in 1996  Vaping Use  . Vaping Use: Never used  Substance and Sexual Activity  . Alcohol use: Not Currently    Alcohol/week: 0.0 standard drinks    Comment: occasional  . Drug use: No  . Sexual activity: Not on file  Other Topics Concern  . Not on file  Social History Narrative  . Not on file   Social Determinants of Health   Financial Resource Strain: Not on file  Food Insecurity: Not on file  Transportation Needs: Not on file  Physical Activity: Not on file  Stress: Not on file  Social Connections: Not on file  Intimate Partner Violence: Not on file    Review of Systems  Constitutional: Positive for fatigue and fever. Negative for chills and diaphoresis.  HENT: Positive for congestion and sore throat. Negative for ear pain, postnasal drip and sinus pressure.   Eyes: Negative for photophobia, discharge, redness, itching and visual disturbance.  Respiratory: Positive for cough. Negative for shortness of breath and wheezing.   Cardiovascular: Negative for chest pain, palpitations and leg swelling.  Gastrointestinal: Positive for diarrhea, nausea and vomiting. Negative for abdominal pain and constipation.  Genitourinary: Negative for dysuria and flank pain.  Musculoskeletal: Negative for arthralgias, back pain, gait problem and neck pain.  Skin: Negative for color change.  Allergic/Immunologic: Negative for environmental allergies and food allergies.  Neurological: Positive for headaches. Negative for dizziness.  Hematological: Does not bruise/bleed easily.  Psychiatric/Behavioral: Negative for  agitation, behavioral problems (depression) and hallucinations.    Vital Signs: Resp 16   Ht 5\' 4"  (1.626 m)   Wt 206 lb (93.4 kg)   BMI 35.36 kg/m   Observation/Objective: Alert and oriented, able to answer questions appropriately. Soft spoken, no evidence of acute respiratory distress.   Assessment/Plan: 1. Suspected COVID-19 virus infection Likely tested too early after symptoms developed Advised to continue with quarantine until she has been afebrile or without symptoms for 24 hours - azithromycin (ZITHROMAX Z-PAK) 250 MG tablet; Take two 250 mg tablets on day one followed by one 250 mg tablet each day for four days.  Dispense: 6 each; Refill: 0  2. Cough Tussionex to be used as needed at night only for persistent coughing disrupting her sleep  Encouraged to take OTC Mucinex and increase fluid intake - azithromycin (ZITHROMAX Z-PAK) 250 MG tablet; Take two 250 mg tablets on day one followed by one 250 mg tablet each day for four days.   Dispense: 6 each; Refill: 0 - chlorpheniramine-HYDROcodone (TUSSIONEX PENNKINETIC ER) 10-8 MG/5ML SUER; Take 5 mLs by mouth at bedtime as needed for cough.  Dispense: 140 mL; Refill: 0  3. Diarrhea, unspecified type May take Lomotil previous prescribed for persistent diarrhea and Zofran for persistent vomiting Increase fluid intake, attempt to eat bland foods  General Counseling: Lizeth verbalizes understanding of the findings of today's phone visit and agrees with plan of treatment. I have discussed any further diagnostic evaluation that may be needed or ordered today. We also reviewed her medications today. she has been encouraged to call the office with any questions or concerns that should arise related to todays visit.   Meds ordered this encounter  Medications  . azithromycin (ZITHROMAX Z-PAK) 250 MG tablet    Sig: Take two 250 mg tablets on day one followed by one 250 mg tablet each day for four days.    Dispense:  6 each    Refill:  0  . chlorpheniramine-HYDROcodone (TUSSIONEX PENNKINETIC ER) 10-8 MG/5ML SUER    Sig: Take 5 mLs by mouth at bedtime as needed for cough.    Dispense:  140 mL    Refill:  0     Time spent:25 Minutes Time spent includes review of chart, medications, test results and follow-up plan with the patient.  Tanna Furry Macky Galik AGNP-C Internal medicine

## 2020-06-05 DIAGNOSIS — M5412 Radiculopathy, cervical region: Secondary | ICD-10-CM | POA: Diagnosis not present

## 2020-06-05 DIAGNOSIS — Z90710 Acquired absence of both cervix and uterus: Secondary | ICD-10-CM | POA: Diagnosis not present

## 2020-06-05 DIAGNOSIS — G8929 Other chronic pain: Secondary | ICD-10-CM | POA: Diagnosis not present

## 2020-06-05 DIAGNOSIS — G4730 Sleep apnea, unspecified: Secondary | ICD-10-CM | POA: Diagnosis not present

## 2020-06-05 DIAGNOSIS — K589 Irritable bowel syndrome without diarrhea: Secondary | ICD-10-CM | POA: Diagnosis not present

## 2020-06-05 DIAGNOSIS — Z79899 Other long term (current) drug therapy: Secondary | ICD-10-CM | POA: Diagnosis not present

## 2020-06-05 DIAGNOSIS — Z7901 Long term (current) use of anticoagulants: Secondary | ICD-10-CM | POA: Diagnosis not present

## 2020-06-05 DIAGNOSIS — M542 Cervicalgia: Secondary | ICD-10-CM | POA: Diagnosis not present

## 2020-06-05 DIAGNOSIS — M5010 Cervical disc disorder with radiculopathy, unspecified cervical region: Secondary | ICD-10-CM | POA: Diagnosis not present

## 2020-06-05 DIAGNOSIS — Z9049 Acquired absence of other specified parts of digestive tract: Secondary | ICD-10-CM | POA: Diagnosis not present

## 2020-06-05 DIAGNOSIS — K219 Gastro-esophageal reflux disease without esophagitis: Secondary | ICD-10-CM | POA: Diagnosis not present

## 2020-06-05 DIAGNOSIS — Z87891 Personal history of nicotine dependence: Secondary | ICD-10-CM | POA: Diagnosis not present

## 2020-06-05 DIAGNOSIS — E1142 Type 2 diabetes mellitus with diabetic polyneuropathy: Secondary | ICD-10-CM | POA: Diagnosis not present

## 2020-06-05 DIAGNOSIS — E785 Hyperlipidemia, unspecified: Secondary | ICD-10-CM | POA: Diagnosis not present

## 2020-06-05 DIAGNOSIS — M25512 Pain in left shoulder: Secondary | ICD-10-CM | POA: Diagnosis not present

## 2020-06-05 DIAGNOSIS — I10 Essential (primary) hypertension: Secondary | ICD-10-CM | POA: Diagnosis not present

## 2020-06-05 DIAGNOSIS — Z794 Long term (current) use of insulin: Secondary | ICD-10-CM | POA: Diagnosis not present

## 2020-06-05 DIAGNOSIS — M1990 Unspecified osteoarthritis, unspecified site: Secondary | ICD-10-CM | POA: Diagnosis not present

## 2020-06-14 ENCOUNTER — Other Ambulatory Visit: Payer: Self-pay

## 2020-06-14 DIAGNOSIS — R200 Anesthesia of skin: Secondary | ICD-10-CM | POA: Diagnosis not present

## 2020-06-14 DIAGNOSIS — R202 Paresthesia of skin: Secondary | ICD-10-CM | POA: Diagnosis not present

## 2020-06-14 MED ORDER — METOPROLOL SUCCINATE ER 50 MG PO TB24: 50.0000 mg | ORAL_TABLET | Freq: Every day | ORAL | 1 refills | Status: DC

## 2020-06-22 ENCOUNTER — Other Ambulatory Visit: Payer: Self-pay | Admitting: Internal Medicine

## 2020-07-24 ENCOUNTER — Other Ambulatory Visit: Payer: Self-pay

## 2020-07-24 ENCOUNTER — Ambulatory Visit (INDEPENDENT_AMBULATORY_CARE_PROVIDER_SITE_OTHER): Payer: BC Managed Care – PPO | Admitting: Nurse Practitioner

## 2020-07-24 ENCOUNTER — Encounter: Payer: Self-pay | Admitting: Nurse Practitioner

## 2020-07-24 VITALS — BP 132/76 | HR 100 | Temp 97.5°F | Ht 64.0 in | Wt 216.5 lb

## 2020-07-24 DIAGNOSIS — I739 Peripheral vascular disease, unspecified: Secondary | ICD-10-CM

## 2020-07-24 DIAGNOSIS — Z7689 Persons encountering health services in other specified circumstances: Secondary | ICD-10-CM | POA: Diagnosis not present

## 2020-07-24 DIAGNOSIS — I10 Essential (primary) hypertension: Secondary | ICD-10-CM

## 2020-07-24 DIAGNOSIS — M792 Neuralgia and neuritis, unspecified: Secondary | ICD-10-CM

## 2020-07-24 DIAGNOSIS — K580 Irritable bowel syndrome with diarrhea: Secondary | ICD-10-CM

## 2020-07-24 DIAGNOSIS — R002 Palpitations: Secondary | ICD-10-CM

## 2020-07-24 MED ORDER — METOPROLOL SUCCINATE ER 50 MG PO TB24
75.0000 mg | ORAL_TABLET | Freq: Every day | ORAL | 1 refills | Status: DC
Start: 2020-07-24 — End: 2021-03-24

## 2020-07-24 MED ORDER — HYDROCODONE-ACETAMINOPHEN 10-325 MG PO TABS: 1.0000 | ORAL_TABLET | Freq: Every evening | ORAL | 0 refills | Status: DC | PRN

## 2020-07-24 NOTE — Progress Notes (Signed)
New Patient Office Visit  Subjective:  Patient ID: Wendy Hubbard, female    DOB: 10/17/1955  Age: 65 y.o. MRN: [id number]  CC:  Chief Complaint  Patient presents with  . New Patient (Initial Visit)    HPI [name] presents to establish new primary care provider. She is changing practices as her primary care provider has changed to new location and the patient has decided to keep me as her primary care provider. Today, she is complaining of intermittent palpitations. Her heart rate will go as high as 118 at rest. She denies chest pain, chest pressure, or shortness of breath with the palpitations. She currently takes metoprolol XR 50mg  daily. Up until recently, this has worked well for her. We performed ECG in the office this morning. She has normal sinus rhythm with low voltage QRS, making it possible that there was anterior infarct with undetermined age. When comparing to prior ECGs, there are no changes. The most recent ECG available is 2018.  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 She states that she has gas/bloating/frequenct episodes of diarrhea since then. She does see Dr. Allen Norris, GI provider in [place]. Her last colonoscopy was 07/04/2015 showing normal colon with internal hemorrhoids and no other abnormalities. The patient states that at her last visit with Dr. Allen Norris, he started her on Citrocel twice daily to help bulk up stools and to use OTC imodium and previously prescribed lomotil to control frequent episodes of diarrhea and cramping. She states that this worked initially, but continues to have many episodes of diarrhea, sometimes without warning that she has to have a bowel movement at all.  The patient has severe neuropathy in her feet and lower legs. Continues to get worse and worse. Most severe is at night. She states that the sheets touching her feet cause burning type pain which is 8/10 in severity. She is given a prescription for hydrocodone/APAP 7.5/325mg  tablets which  she can take at night only when pain is severe. She is also on gabapentin twice daily and Requip at night. She is now seeing a neurologist to evaluate root cause of neuropathy. She did, recently, have a nerve conduction study done. She needs to have a follow up appointment to discuss the results. Her PDMP profile was reviewed. Her Overdose Risk Score was 180 and her fill history is appropriate.   Past Medical History:  Diagnosis Date  . Allergy    Phreesia 07/23/2020  . Anxiety    Phreesia 07/23/2020  . COPD (chronic obstructive pulmonary disease) ([place])   . Depression    Phreesia 07/23/2020  . Diabetes mellitus without complication ([place])    Phreesia 07/23/2020  . GERD (gastroesophageal reflux disease)   . Hyperlipidemia   . Hypertension   . Rapid heart rate   . Sleep apnea     Past Surgical History:  Procedure Laterality Date  . ABDOMINAL HYSTERECTOMY    . ANKLE ARTHROSCOPY Bilateral   . APPENDECTOMY    . BLADDER SUSPENSION    . BREAST EXCISIONAL BIOPSY Left 1990   neg  . BREAST EXCISIONAL BIOPSY Right 1989   neg  . BREAST SURGERY    . CHOLECYSTECTOMY    . COLONOSCOPY N/A 11/04/2015   Procedure: COLONOSCOPY;  Surgeon: Manya Silvas, MD;  [facility] ENDOSCOPY;  Service: Endoscopy;  Laterality: N/A;  . ESOPHAGOGASTRODUODENOSCOPY N/A 11/02/2015   Procedure: ESOPHAGOGASTRODUODENOSCOPY (EGD);  Surgeon: Manya Silvas, MD;  Location: [facility] ENDOSCOPY;  Service: Endoscopy;  Laterality: N/A;  .  KNEE ARTHROSCOPY Left   . REDUCTION MAMMAPLASTY Bilateral 1988  . TONSILLECTOMY      Family History  Problem Relation Age of Onset  . Hypertension Other   . Bladder Cancer Mother   . High Cholesterol Mother   . High blood pressure Mother   . Cancer Mother   . Heart attack Father   . High blood pressure Father   . Kidney cancer Neg Hx   . Prostate cancer Neg Hx   . Breast cancer Neg Hx     Social History   Socioeconomic History  . Marital status: Married    Spouse name:  Not on file  . Number of children: Not on file  . Years of education: Not on file  . Highest education level: Not on file  Occupational History  . Not on file  Tobacco Use  . Smoking status: Former Research scientist (life sciences)  . Smokeless tobacco: Never Used  . Tobacco comment: quit in 1996  Vaping Use  . Vaping Use: Never used  Substance and Sexual Activity  . Alcohol use: Yes    Alcohol/week: 0.0 standard drinks    Comment: occasional  . Drug use: No  . Sexual activity: Yes    Partners: Male  Other Topics Concern  . Not on file  Social History Narrative  . Not on file   Social Determinants of Health   Financial Resource Strain: Not on file  Food Insecurity: Not on file  Transportation Needs: Not on file  Physical Activity: Not on file  Stress: Not on file  Social Connections: Not on file  Intimate Partner Violence: Not on file    ROS Review of Systems  Constitutional: Positive for activity change and fatigue. Negative for chills and fever.       Patient structuring many of her activities around changing bowel habits.   HENT: Negative for congestion and sinus pain.   Respiratory: Negative for cough and shortness of breath.   Cardiovascular: Positive for palpitations. Negative for chest pain.       Patient states she is having more frequent episodes of palpitations. States that her heart rate can be as high as 118 at rest.   Gastrointestinal: Positive for abdominal distention, abdominal pain and diarrhea. Negative for constipation.       Frequent episodes of bloating, especially after eating.   Endocrine: Negative.   Musculoskeletal: Negative for back pain and myalgias.  Skin: Negative for rash.  Allergic/Immunologic: Positive for environmental allergies.  Neurological: Positive for numbness. Negative for dizziness, weakness and headaches.       Severe tingling, numbness, and burning in both feet and lower legs. This effects her much more at night. Pain can be so bad it keeps her awake  at night .  Psychiatric/Behavioral: The patient is nervous/anxious.   All other systems reviewed and are negative.   Objective:   Today's Vitals   07/24/20 1611  BP: 132/76  Pulse: 100  Temp: (!) 97.5 F (36.4 C)  SpO2: 98%  Weight: 216 lb 8 oz (98.2 kg)  Height: 5\' 4"  (1.626 m)   Body mass index is 37.16 kg/m.   Physical Exam Vitals and nursing note reviewed.  Constitutional:      Appearance: Normal appearance. She is well-developed.  HENT:     Head: Normocephalic and atraumatic.  Eyes:     Pupils: Pupils are equal, round, and reactive to light.  Cardiovascular:     Rate and Rhythm: Normal rate and regular rhythm.  Pulmonary:  Effort: Pulmonary effort is normal.     Breath sounds: Normal breath sounds.  Abdominal:     General: Bowel sounds are increased.     Palpations: Abdomen is soft.     Tenderness: There is generalized abdominal tenderness.     Comments: Mild, generalized tenderness with light and medicum palpation of the abdomen.   Musculoskeletal:        General: Normal range of motion.     Cervical back: Normal range of motion and neck supple.  Skin:    General: Skin is warm and dry.     Capillary Refill: Capillary refill takes 2 to 3 seconds.  Neurological:     General: No focal deficit present.     Mental Status: She is alert and oriented to person, place, and time.  Psychiatric:        Mood and Affect: Mood normal.        Behavior: Behavior normal.        Thought Content: Thought content normal.        Judgment: Judgment normal.     Assessment & Plan:  1. Encounter to establish care Appointment today to establish primary care provider in new practice.   2. Palpitations ECG today shows normal sinus rhythm with low voltage QRS complexes. An anterior infarct of undetermined age cannot be ruled out. This is unchaged from most recent ECG done in 2018. Will increase dose metoprolol XR to 75mg  daily. Will check labs. Patient will monitor her heart  rate and blood pressure closely at home.  - metoprolol succinate (TOPROL-XL) 50 MG 24 hr tablet; Take 1.5 tablets (75 mg total) by mouth at bedtime. Take with or immediately following a meal.  Dispense: 135 tablet; Refill: 1 - EKG 12-Lead  3. Essential hypertension Increased dose metoprolol XL to 75mg  daily. Patient to monitor her blood pressure and heart rate closely at home.  - metoprolol succinate (TOPROL-XL) 50 MG 24 hr tablet; Take 1.5 tablets (75 mg total) by mouth at bedtime. Take with or immediately following a meal.  Dispense: 135 tablet; Refill: 1  4. Peripheral neuropathic pain Patient should continue with requip and gabapentin as previously prescribed. Renew hydrocodone/APAP 10/325mg  which may be taken at bedtime only. Advised her to follow up with neurology provider for results of EMG testing and further plan for treatment.  - HYDROcodone-acetaminophen (NORCO) 10-325 MG tablet; Take 1 tablet by mouth at bedtime as needed. For acute and severe pain  Dispense: 30 tablet; Refill: 0  5. Intermittent claudication (HCC) Continue with pletal as prescribed   6. Irritable bowel syndrome with diarrhea Encouraged the patient to contact Dr. Allen Norris, her new GI provider, to advise him of new and worsening symptoms she is experiencing. patient voiced agreement with this plan.    Problem List Items Addressed This Visit      Cardiovascular and Mediastinum   Essential hypertension   Relevant Medications   metoprolol succinate (TOPROL-XL) 50 MG 24 hr tablet     Digestive   Irritable bowel syndrome with diarrhea     Other   Peripheral neuropathic pain   Relevant Medications   HYDROcodone-acetaminophen (NORCO) 10-325 MG tablet   Intermittent claudication (HCC)   Relevant Medications   HYDROcodone-acetaminophen (NORCO) 10-325 MG tablet   Palpitations   Relevant Medications   metoprolol succinate (TOPROL-XL) 50 MG 24 hr tablet   Other Relevant Orders   EKG 12-Lead   Encounter to  establish care - Primary      Outpatient Encounter  Medications as of 07/24/2020  Medication Sig  . ALPRAZolam (XANAX) 0.5 MG tablet Take half to one tab a day prn only for acute anxiety or panic attack  . butalbital-acetaminophen-caffeine (FIORICET) 50-325-40 MG tablet Take 1-2 tablets by mouth every 6 (six) hours as needed for headache.  . cephALEXin (KEFLEX) 500 MG capsule Take 1 capsule (500 mg total) by mouth 3 (three) times daily.  . cilostazol (PLETAL) 100 MG tablet Take 1 tablet (100 mg total) by mouth 2 (two) times daily.  . diphenoxylate-atropine (LOMOTIL) 2.5-0.025 MG tablet Take 1 tablet by mouth 4 (four) times daily as needed for diarrhea or loose stools.  Marland Kitchen escitalopram (LEXAPRO) 10 MG tablet TAKE ONE TAB BY MOUTH TWICE A DAY FOR DEPRESSION  . furosemide (LASIX) 20 MG tablet TAKE 1 TABLET (20 MG TOTAL) BY MOUTH DAILY. TAKE ONCE A DAY AS NEEDED EDEMA  . gabapentin (NEURONTIN) 600 MG tablet Take one AM, one after lunch, and 2 QHS (Patient taking differently: Take 600-1,200 mg by mouth 3 (three) times daily. 600MG -AM 600MG -NOON 1,200MG -PM)  . mirtazapine (REMERON) 15 MG tablet Take 1 tablet (15 mg total) by mouth at bedtime. For sleep  . montelukast (SINGULAIR) 10 MG tablet Take 1 tablet (10 mg total) by mouth at bedtime.  . ondansetron (ZOFRAN-ODT) 4 MG disintegrating tablet Take 1 to 2 tablets po TID prn nausea  . pantoprazole (PROTONIX) 40 MG tablet Take one tablet by mouth daily.  Marland Kitchen rOPINIRole (REQUIP) 0.5 MG tablet Take 1 tablet po QHS. Pay increase to 2 tablets po QHS as needed and as tolerated .  . rosuvastatin (CRESTOR) 10 MG tablet Take 1 tablet (10 mg total) by mouth daily.  Marland Kitchen tiZANidine (ZANAFLEX) 4 MG tablet Take 1 tablet po BID prn muscle pain/spasms. (Patient taking differently: Take 4 mg by mouth every 6 (six) hours as needed.)  . [DISCONTINUED] chlorpheniramine-HYDROcodone (TUSSIONEX PENNKINETIC ER) 10-8 MG/5ML SUER Take 5 mLs by mouth at bedtime as needed for cough.   . [DISCONTINUED] HYDROcodone-acetaminophen (NORCO) 10-325 MG tablet Take 1 tablet by mouth daily as needed. For acute and severe pain  . [DISCONTINUED] metoprolol succinate (TOPROL-XL) 50 MG 24 hr tablet Take 1 tablet (50 mg total) by mouth at bedtime. Take with or immediately following a meal.  . azithromycin (ZITHROMAX Z-PAK) 250 MG tablet Take two 250 mg tablets on day one followed by one 250 mg tablet each day for four days. (Patient not taking: Reported on 07/24/2020)  . HYDROcodone-acetaminophen (NORCO) 10-325 MG tablet Take 1 tablet by mouth at bedtime as needed. For acute and severe pain  . metoprolol succinate (TOPROL-XL) 50 MG 24 hr tablet Take 1.5 tablets (75 mg total) by mouth at bedtime. Take with or immediately following a meal.  . [DISCONTINUED] escitalopram (LEXAPRO) 20 MG tablet Take 1 tablet (20 mg total) by mouth daily. (Patient not taking: Reported on 07/24/2020)   No facility-administered encounter medications on file as of 07/24/2020.   Time spent with the patient was approximately 45 minutes. This time included reviewing progress notes, labs, imaging studies, and discussing plan for follow up.   Follow-up: Return in about 4 weeks (around 08/21/2020) for bp, neuropathy - fasting blood work needed soon. Marland Kitchen   Ronnell Freshwater, NP

## 2020-07-24 NOTE — Patient Instructions (Signed)
Palpitations Palpitations are feelings that your heartbeat is not normal. Your heartbeat may feel like it is:  Uneven.  Faster than normal.  Fluttering.  Skipping a beat. This is usually not a serious problem. In some cases, you may need tests to rule out any serious problems. Follow these instructions at home: Pay attention to any changes in your condition. Take these actions to help manage your symptoms: Eating and drinking  Avoid: ? Coffee, tea, soft drinks, and energy drinks. ? Chocolate. ? Alcohol. ? Diet pills. Lifestyle  Try to lower your stress. These things can help you relax: ? Yoga. ? Deep breathing and meditation. ? Exercise. ? Using words and images to create positive thoughts (guided imagery). ? Using your mind to control things in your body (biofeedback).  Do not use drugs.  Get plenty of rest and sleep. Keep a regular bed time.   General instructions  Take over-the-counter and prescription medicines only as told by your doctor.  Do not use any products that contain nicotine or tobacco, such as cigarettes and e-cigarettes. If you need help quitting, ask your doctor.  Keep all follow-up visits as told by your doctor. This is important. You may need more tests if palpitations do not go away or get worse.   Contact a doctor if:  Your symptoms last more than 24 hours.  Your symptoms occur more often. Get help right away if you:  Have chest pain.  Feel short of breath.  Have a very bad headache.  Feel dizzy.  Pass out (faint). Summary  Palpitations are feelings that your heartbeat is uneven or faster than normal. It may feel like your heart is fluttering or skipping a beat.  Avoid food and drinks that may cause palpitations. These include caffeine, chocolate, and alcohol.  Try to lower your stress. Do not smoke or use drugs.  Get help right away if you faint or have chest pain, shortness of breath, a severe headache, or dizziness. This  information is not intended to replace advice given to you by your health care provider. Make sure you discuss any questions you have with your health care provider. Document Revised: 06/09/2017 Document Reviewed: 06/09/2017 Elsevier Patient Education  2021 Elsevier Inc.  

## 2020-07-25 ENCOUNTER — Other Ambulatory Visit: Payer: Self-pay | Admitting: Internal Medicine

## 2020-07-26 DIAGNOSIS — M5412 Radiculopathy, cervical region: Secondary | ICD-10-CM | POA: Diagnosis not present

## 2020-07-29 ENCOUNTER — Telehealth: Payer: Self-pay | Admitting: Gastroenterology

## 2020-07-29 ENCOUNTER — Other Ambulatory Visit: Payer: Self-pay | Admitting: Nurse Practitioner

## 2020-07-29 DIAGNOSIS — Z7689 Persons encountering health services in other specified circumstances: Secondary | ICD-10-CM | POA: Insufficient documentation

## 2020-07-29 DIAGNOSIS — R002 Palpitations: Secondary | ICD-10-CM | POA: Insufficient documentation

## 2020-07-29 DIAGNOSIS — I739 Peripheral vascular disease, unspecified: Secondary | ICD-10-CM

## 2020-07-29 NOTE — Telephone Encounter (Signed)
Patient called, states she is having constant diarrhea, even in her sleep.  Please call to advise.

## 2020-07-30 ENCOUNTER — Other Ambulatory Visit: Payer: Self-pay | Admitting: Nurse Practitioner

## 2020-07-30 ENCOUNTER — Other Ambulatory Visit: Payer: Self-pay | Admitting: Internal Medicine

## 2020-07-30 ENCOUNTER — Encounter: Payer: Self-pay | Admitting: Nurse Practitioner

## 2020-07-30 DIAGNOSIS — F3341 Major depressive disorder, recurrent, in partial remission: Secondary | ICD-10-CM

## 2020-07-30 DIAGNOSIS — J301 Allergic rhinitis due to pollen: Secondary | ICD-10-CM

## 2020-08-02 ENCOUNTER — Other Ambulatory Visit: Payer: Self-pay | Admitting: Nurse Practitioner

## 2020-08-02 DIAGNOSIS — Z0000 Encounter for general adult medical examination without abnormal findings: Secondary | ICD-10-CM

## 2020-08-02 DIAGNOSIS — I10 Essential (primary) hypertension: Secondary | ICD-10-CM

## 2020-08-05 NOTE — Telephone Encounter (Signed)
Contacted pt and requested an appt with Dr. Allen Norris due to her symptoms. Pt was scheduled for Tuesday, April 5th.

## 2020-08-07 ENCOUNTER — Other Ambulatory Visit: Payer: Self-pay

## 2020-08-07 ENCOUNTER — Other Ambulatory Visit: Payer: BC Managed Care – PPO

## 2020-08-07 DIAGNOSIS — Z0000 Encounter for general adult medical examination without abnormal findings: Secondary | ICD-10-CM

## 2020-08-07 DIAGNOSIS — R197 Diarrhea, unspecified: Secondary | ICD-10-CM

## 2020-08-07 DIAGNOSIS — I10 Essential (primary) hypertension: Secondary | ICD-10-CM

## 2020-08-08 LAB — LIPID PANEL
Chol/HDL Ratio: 3.1 ratio (ref 0.0–4.4)
Cholesterol, Total: 174 mg/dL (ref 100–199)
HDL: 57 mg/dL (ref >39)
LDL Chol Calc (NIH): 75 mg/dL (ref 0–99)
Triglycerides: 259 mg/dL — ABNORMAL HIGH (ref 0–149)
VLDL Cholesterol Cal: 42 mg/dL — ABNORMAL HIGH (ref 5–40)

## 2020-08-08 LAB — COMPREHENSIVE METABOLIC PANEL
ALT: 12 IU/L (ref 0–32)
AST: 18 IU/L (ref 0–40)
Albumin/Globulin Ratio: 2 (ref 1.2–2.2)
Albumin: 4.4 g/dL (ref 3.8–4.8)
Alkaline Phosphatase: 115 IU/L (ref 44–121)
BUN/Creatinine Ratio: 16 (ref 12–28)
BUN: 14 mg/dL (ref 8–27)
Bilirubin Total: 0.3 mg/dL (ref 0.0–1.2)
CO2: 21 mmol/L (ref 20–29)
Calcium: 9.4 mg/dL (ref 8.7–10.3)
Chloride: 104 mmol/L (ref 96–106)
Creatinine, Ser: 0.88 mg/dL (ref 0.57–1.00)
Globulin, Total: 2.2 g/dL (ref 1.5–4.5)
Glucose: 114 mg/dL — ABNORMAL HIGH (ref 65–99)
Potassium: 4.5 mmol/L (ref 3.5–5.2)
Sodium: 144 mmol/L (ref 134–144)
Total Protein: 6.6 g/dL (ref 6.0–8.5)
eGFR: 73 mL/min/{1.73_m2} (ref >59)

## 2020-08-08 LAB — CBC
Hematocrit: 39.1 % (ref 34.0–46.6)
Hemoglobin: 13.2 g/dL (ref 11.1–15.9)
MCH: 31.4 pg (ref 26.6–33.0)
MCHC: 33.8 g/dL (ref 31.5–35.7)
MCV: 93 fL (ref 79–97)
Platelets: 316 10*3/uL (ref 150–450)
RBC: 4.21 x10E6/uL (ref 3.77–5.28)
RDW: 11.5 % — ABNORMAL LOW (ref 11.7–15.4)
WBC: 8.5 10*3/uL (ref 3.4–10.8)

## 2020-08-08 LAB — HEMOGLOBIN A1C
Est. average glucose Bld gHb Est-mCnc: 128 mg/dL
Hgb A1c MFr Bld: 6.1 % — ABNORMAL HIGH (ref 4.8–5.6)

## 2020-08-08 LAB — TSH: TSH: 2.08 u[IU]/mL (ref 0.450–4.500)

## 2020-08-10 ENCOUNTER — Other Ambulatory Visit: Payer: Self-pay | Admitting: Internal Medicine

## 2020-08-12 ENCOUNTER — Other Ambulatory Visit: Payer: Self-pay | Admitting: Nurse Practitioner

## 2020-08-12 ENCOUNTER — Encounter: Payer: Self-pay | Admitting: Nurse Practitioner

## 2020-08-12 DIAGNOSIS — R600 Localized edema: Secondary | ICD-10-CM

## 2020-08-12 MED ORDER — FUROSEMIDE 20 MG PO TABS: 20.0000 mg | ORAL_TABLET | Freq: Every day | ORAL | 2 refills | Status: DC

## 2020-08-12 NOTE — Progress Notes (Signed)
Renewed furosemide 20mg  daily to take when needed for edema. Sent to [facility] in [place].

## 2020-08-12 NOTE — Progress Notes (Signed)
Triglycerides elevated, but all other labs good. Average cholesterol/HDL ratio. Will monitor closely. Message sent via mychart.

## 2020-08-13 ENCOUNTER — Ambulatory Visit (INDEPENDENT_AMBULATORY_CARE_PROVIDER_SITE_OTHER): Payer: BC Managed Care – PPO | Admitting: Gastroenterology

## 2020-08-13 ENCOUNTER — Other Ambulatory Visit: Payer: Self-pay

## 2020-08-13 ENCOUNTER — Encounter: Payer: Self-pay | Admitting: Gastroenterology

## 2020-08-13 VITALS — BP 133/72 | HR 87 | Ht 64.0 in | Wt 219.2 lb

## 2020-08-13 DIAGNOSIS — R197 Diarrhea, unspecified: Secondary | ICD-10-CM | POA: Diagnosis not present

## 2020-08-13 MED ORDER — ALOSETRON HCL 1 MG PO TABS: 1.0000 mg | ORAL_TABLET | Freq: Two times a day (BID) | ORAL | 5 refills | Status: DC

## 2020-08-13 NOTE — Progress Notes (Signed)
Primary Care Physician: Ronnell Freshwater, NP  Primary Gastroenterologist:  Dr. Lucilla Lame  Chief Complaint  Patient presents with  . Diarrhea    HPI: Wendy Hubbard is a 65 y.o. female here with a history of diarrhea.  The patient states her diarrhea has gotten worse that she is now having nocturnal stooling while she is sleeping.  The patient had a colonoscopy in 2017 by Dr. Vira Agar without any biopsies taken at that time.  The patient states that her Lomotil is not helping her.  The patient has been told that she has irritable bowel syndrome in the past.  She also has a history of C. difficile colitis in the past.  Past Medical History:  Diagnosis Date  . Allergy    Phreesia 07/23/2020  . Anxiety    Phreesia 07/23/2020  . COPD (chronic obstructive pulmonary disease) ([place])   . Depression    Phreesia 07/23/2020  . Diabetes mellitus without complication ([place])    Phreesia 07/23/2020  . GERD (gastroesophageal reflux disease)   . Hyperlipidemia   . Hypertension   . Rapid heart rate   . Sleep apnea     Current Outpatient Medications  Medication Sig Dispense Refill  . alosetron (LOTRONEX) 1 MG tablet Take 1 tablet (1 mg total) by mouth 2 (two) times daily. 60 tablet 5  . ALPRAZolam (XANAX) 0.5 MG tablet Take half to one tab a day prn only for acute anxiety or panic attack 30 tablet 0  . butalbital-acetaminophen-caffeine (FIORICET) 50-325-40 MG tablet Take 1-2 tablets by mouth every 6 (six) hours as needed for headache. 30 tablet 2  . cilostazol (PLETAL) 100 MG tablet TAKE 1 TABLET BY MOUTH TWICE A DAY 180 tablet 0  . diphenoxylate-atropine (LOMOTIL) 2.5-0.025 MG tablet Take 1 tablet by mouth 4 (four) times daily as needed for diarrhea or loose stools. 30 tablet 1  . escitalopram (LEXAPRO) 10 MG tablet TAKE ONE TAB BY MOUTH TWICE A DAY FOR DEPRESSION 180 tablet 0  . furosemide (LASIX) 20 MG tablet Take 1 tablet (20 mg total) by mouth daily. Take once a day as needed edema 30  tablet 2  . gabapentin (NEURONTIN) 600 MG tablet Take one AM, one after lunch, and 2 QHS (Patient taking differently: Take 600-1,200 mg by mouth 3 (three) times daily. 600MG -AM 600MG -NOON 1,200MG -PM) 360 tablet 3  . HYDROcodone-acetaminophen (NORCO) 10-325 MG tablet Take 1 tablet by mouth at bedtime as needed. For acute and severe pain 30 tablet 0  . metoprolol succinate (TOPROL-XL) 50 MG 24 hr tablet Take 1.5 tablets (75 mg total) by mouth at bedtime. Take with or immediately following a meal. 135 tablet 1  . mirtazapine (REMERON) 15 MG tablet Take 1 tablet (15 mg total) by mouth at bedtime. For sleep 90 tablet 1  . montelukast (SINGULAIR) 10 MG tablet TAKE 1 TABLET BY MOUTH EVERYDAY AT BEDTIME 90 tablet 1  . ondansetron (ZOFRAN-ODT) 4 MG disintegrating tablet Take 1 to 2 tablets po TID prn nausea 45 tablet 1  . pantoprazole (PROTONIX) 40 MG tablet TAKE 1 TABLET BY MOUTH DAILY 90 tablet 1  . rOPINIRole (REQUIP) 0.5 MG tablet Take 1 tablet po QHS. Pay increase to 2 tablets po QHS as needed and as tolerated . 180 tablet 1  . rosuvastatin (CRESTOR) 10 MG tablet Take 1 tablet (10 mg total) by mouth daily. 90 tablet 1  . tiZANidine (ZANAFLEX) 4 MG tablet Take 1 tablet po BID prn muscle pain/spasms. (Patient taking differently:  Take 4 mg by mouth every 6 (six) hours as needed.) 45 tablet 3   No current facility-administered medications for this visit.    Allergies as of 08/13/2020 - Review Complete 08/13/2020  Allergen Reaction Noted  . Flagyl [metronidazole] Anaphylaxis and Rash 08/22/2014  . Other  07/23/2020  . Penicillins Anaphylaxis and Other (See Comments) 08/22/2014  . Doxycycline Hives 08/22/2014    ROS:  General: Negative for anorexia, weight loss, fever, chills, fatigue, weakness. ENT: Negative for hoarseness, difficulty swallowing , nasal congestion. CV: Negative for chest pain, angina, palpitations, dyspnea on exertion, peripheral edema.  Respiratory: Negative for dyspnea at  rest, dyspnea on exertion, cough, sputum, wheezing.  GI: See history of present illness. GU:  Negative for dysuria, hematuria, urinary incontinence, urinary frequency, nocturnal urination.  Endo: Negative for unusual weight change.    Physical Examination:   BP 133/72   Pulse 87   Ht 5\' 4"  (1.626 m)   Wt 219 lb 3.2 oz (99.4 kg)   BMI 37.63 kg/m   General: Well-nourished, well-developed in no acute distress.  Eyes: No icterus. Conjunctivae pink. Lungs: Clear to auscultation bilaterally. Non-labored. Heart: Regular rate and rhythm, no murmurs rubs or gallops.  Abdomen: Bowel sounds are normal, nontender, nondistended, no hepatosplenomegaly or masses, no abdominal bruits or hernia , no rebound or guarding.   Extremities: No lower extremity edema. No clubbing or deformities. Neuro: Alert and oriented x 3.  Grossly intact. Skin: Warm and dry, no jaundice.   Psych: Alert and cooperative, normal mood and affect.  Labs:    Imaging Studies: No results found.  Assessment and Plan:   Wendy Hubbard is a 65 y.o. y/o female who comes in today with worsening diarrhea and reports that she is now having nocturnal soiling.  The patient will have her blood sent off for a celiac panel.  The patient will also be set up for a breath test for bacterial overgrowth and she will also be set up for colonoscopy to look for a cause for her diarrhea.  The patient will also be started on Lotronex for her diarrhea since she had her gallbladder out and cannot take Viberzi.  The patient has been explained the plan and agrees with it.     Lucilla Lame, MD. Marval Regal    Note: This dictation was prepared with Dragon dictation along with smaller phrase technology. Any transcriptional errors that result from this process are unintentional.

## 2020-08-15 ENCOUNTER — Other Ambulatory Visit: Payer: Self-pay | Admitting: Internal Medicine

## 2020-08-15 ENCOUNTER — Telehealth: Payer: Self-pay

## 2020-08-15 LAB — CELIAC DISEASE PANEL
Endomysial IgA: NEGATIVE
IgA/Immunoglobulin A, Serum: 213 mg/dL (ref 87–352)
Transglutaminase IgA: <2 U/mL (ref 0–3)

## 2020-08-15 NOTE — Telephone Encounter (Signed)
Pt notified of lab results through mychart.  

## 2020-08-15 NOTE — Telephone Encounter (Signed)
-----   Message from Lucilla Lame, MD sent at 08/15/2020  8:16 AM EDT ----- Let the patient know the sprue test was negative.

## 2020-08-18 DIAGNOSIS — S9032XA Contusion of left foot, initial encounter: Secondary | ICD-10-CM | POA: Diagnosis not present

## 2020-08-22 ENCOUNTER — Encounter: Payer: Self-pay | Admitting: Gastroenterology

## 2020-08-22 ENCOUNTER — Other Ambulatory Visit: Payer: Self-pay

## 2020-08-22 ENCOUNTER — Ambulatory Visit: Payer: BC Managed Care – PPO | Admitting: Hospice and Palliative Medicine

## 2020-08-26 ENCOUNTER — Other Ambulatory Visit: Payer: Self-pay

## 2020-08-26 ENCOUNTER — Other Ambulatory Visit: Payer: Self-pay | Admitting: Nurse Practitioner

## 2020-08-26 ENCOUNTER — Telehealth: Payer: Self-pay

## 2020-08-26 ENCOUNTER — Other Ambulatory Visit: Payer: Self-pay | Admitting: Gastroenterology

## 2020-08-26 ENCOUNTER — Other Ambulatory Visit: Payer: Self-pay | Admitting: Internal Medicine

## 2020-08-26 DIAGNOSIS — G2581 Restless legs syndrome: Secondary | ICD-10-CM

## 2020-08-26 DIAGNOSIS — I739 Peripheral vascular disease, unspecified: Secondary | ICD-10-CM

## 2020-08-26 DIAGNOSIS — F3341 Major depressive disorder, recurrent, in partial remission: Secondary | ICD-10-CM

## 2020-08-26 DIAGNOSIS — E782 Mixed hyperlipidemia: Secondary | ICD-10-CM

## 2020-08-26 MED ORDER — MIRTAZAPINE 15 MG PO TABS: 15.0000 mg | ORAL_TABLET | Freq: Every day | ORAL | 0 refills | Status: DC

## 2020-08-26 MED ORDER — ESCITALOPRAM OXALATE 10 MG PO TABS: ORAL_TABLET | ORAL | 0 refills | Status: DC

## 2020-08-26 MED ORDER — CLENPIQ 10-3.5-12 MG-GM -GM/160ML PO SOLN: 320.0000 mL | ORAL | 0 refills | Status: DC

## 2020-08-26 NOTE — Telephone Encounter (Signed)
Patient lvm in regards to stopping Pletal, and Lotrinex prior to her colonoscopy.  Please advise patient.  Thanks,  [place], [place]

## 2020-08-26 NOTE — Telephone Encounter (Signed)
Left vm regarding stopping Pletal and Lotronex. Also sent mychart message. Advised pt to return my call to discuss.

## 2020-08-28 ENCOUNTER — Other Ambulatory Visit: Payer: Self-pay

## 2020-08-28 ENCOUNTER — Ambulatory Visit (INDEPENDENT_AMBULATORY_CARE_PROVIDER_SITE_OTHER): Payer: BC Managed Care – PPO | Admitting: Nurse Practitioner

## 2020-08-28 ENCOUNTER — Telehealth: Payer: Self-pay

## 2020-08-28 ENCOUNTER — Encounter: Payer: Self-pay | Admitting: Nurse Practitioner

## 2020-08-28 VITALS — BP 139/85 | HR 85 | Temp 97.8°F | Ht 64.0 in | Wt 220.0 lb

## 2020-08-28 DIAGNOSIS — R002 Palpitations: Secondary | ICD-10-CM | POA: Diagnosis not present

## 2020-08-28 DIAGNOSIS — M792 Neuralgia and neuritis, unspecified: Secondary | ICD-10-CM | POA: Diagnosis not present

## 2020-08-28 DIAGNOSIS — F3341 Major depressive disorder, recurrent, in partial remission: Secondary | ICD-10-CM

## 2020-08-28 DIAGNOSIS — I10 Essential (primary) hypertension: Secondary | ICD-10-CM

## 2020-08-28 DIAGNOSIS — R7303 Prediabetes: Secondary | ICD-10-CM | POA: Diagnosis not present

## 2020-08-28 DIAGNOSIS — K580 Irritable bowel syndrome with diarrhea: Secondary | ICD-10-CM

## 2020-08-28 MED ORDER — HYDROCODONE-ACETAMINOPHEN 10-325 MG PO TABS: 1.0000 | ORAL_TABLET | Freq: Every evening | ORAL | 0 refills | Status: DC | PRN

## 2020-08-28 MED ORDER — ESCITALOPRAM OXALATE 10 MG PO TABS: ORAL_TABLET | ORAL | 1 refills | Status: DC

## 2020-08-28 MED ORDER — PEG 3350-KCL-NA BICARB-NACL 420 G PO SOLR: ORAL | 0 refills | Status: DC

## 2020-08-28 MED ORDER — MIRTAZAPINE 15 MG PO TABS: 15.0000 mg | ORAL_TABLET | Freq: Every day | ORAL | 1 refills | Status: DC

## 2020-08-28 MED ORDER — OZEMPIC (0.25 OR 0.5 MG/DOSE) 2 MG/1.5ML ~~LOC~~ SOPN: 0.5000 mg | PEN_INJECTOR | SUBCUTANEOUS | 3 refills | Status: DC

## 2020-08-28 NOTE — Telephone Encounter (Signed)
Wendy Hubbard has been sent to [facility]. Pt notified.

## 2020-08-28 NOTE — Progress Notes (Signed)
Established Patient Office Visit  Subjective:  Patient ID: Wendy Hubbard, female    DOB: 1955/10/23  Age: 65 y.o. MRN: [id number]  CC:  Chief Complaint  Patient presents with  . Follow-up    HPI Wendy Hubbard presents for follow up visit. Increased her metoprolol XR to 75mg  at her most recent visit. She has tolerated the dose increase well. Blood pressure is stable. Heart rate and rhythm are improved. Denies palpitations after increasing the dose. She did have labs done since her last visit. Her HgbA1c is 6.1, up from 5.3 the last time it was checked. Triglycerides were also elevated at 259. Her total, HDL, and LDL levels were normal with the CHOL/LDL ratio at 3.1. other labs were good.  She is concerned about persistent weight gain. 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 Initially did very well, but has struggled over the past year. She has had a three pound weight gain since her last visit with me. She feels as though weight gain is contributing to higher blood glucose levels.  She does need refill for her hydrocodone/APAP 10/325mg  tablets. She takes this medication only at night as the pain is most severe at night. Has been so bad that sheets cannot touch her feet, the pain is excruciating.  Reviewed her PDMP profile. Her Overdose Risk Score is 220 and her fill history is appropriate.   Past Medical History:  Diagnosis Date  . Allergy    Phreesia 07/23/2020  . Anxiety    Phreesia 07/23/2020  . COPD (chronic obstructive pulmonary disease) ([place])   . DDD (degenerative disc disease), thoracic   . Depression    Phreesia 07/23/2020  . Diabetes mellitus without complication ([place])    Phreesia 07/23/2020  . GERD (gastroesophageal reflux disease)   . Hyperlipidemia   . Hypertension   . Rapid heart rate   . Sleep apnea   . Wears contact lenses     Past Surgical History:  Procedure Laterality Date  . ABDOMINAL HYSTERECTOMY    . ANKLE ARTHROSCOPY Bilateral   . APPENDECTOMY     . BLADDER SUSPENSION    . BREAST EXCISIONAL BIOPSY Left 1990   neg  . BREAST EXCISIONAL BIOPSY Right 1989   neg  . BREAST SURGERY    . CHOLECYSTECTOMY    . COLONOSCOPY N/A 11/04/2015   Procedure: COLONOSCOPY;  Surgeon: Manya Silvas, MD;  Location: [facility] ENDOSCOPY;  Service: Endoscopy;  Laterality: N/A;  . ESOPHAGOGASTRODUODENOSCOPY N/A 11/02/2015   Procedure: ESOPHAGOGASTRODUODENOSCOPY (EGD);  Surgeon: Manya Silvas, MD;  Location: [facility] ENDOSCOPY;  Service: Endoscopy;  Laterality: N/A;  . KNEE ARTHROSCOPY Left   . REDUCTION MAMMAPLASTY Bilateral 1988  . TONSILLECTOMY      Family History  Problem Relation Age of Onset  . Hypertension Other   . Bladder Cancer Mother   . High Cholesterol Mother   . High blood pressure Mother   . Cancer Mother   . Heart attack Father   . High blood pressure Father   . Kidney cancer Neg Hx   . Prostate cancer Neg Hx   . Breast cancer Neg Hx     Social History   Socioeconomic History  . Marital status: Married    Spouse name: Not on file  . Number of children: Not on file  . Years of education: Not on file  . Highest education level: Not on file  Occupational History  . Not on file  Tobacco Use  . Smoking  status: Former Research scientist (life sciences)  . Smokeless tobacco: Never Used  . Tobacco comment: quit in 1996  Vaping Use  . Vaping Use: Never used  Substance and Sexual Activity  . Alcohol use: Yes    Alcohol/week: 2.0 standard drinks    Types: 1 Glasses of wine, 1 Standard drinks or equivalent per week    Comment: occasional  . Drug use: No  . Sexual activity: Yes    Partners: Male  Other Topics Concern  . Not on file  Social History Narrative  . Not on file   Social Determinants of Health   Financial Resource Strain: Not on file  Food Insecurity: Not on file  Transportation Needs: Not on file  Physical Activity: Not on file  Stress: Not on file  Social Connections: Not on file  Intimate Partner Violence: Not on file    Outpatient  Medications Prior to Visit  Medication Sig Dispense Refill  . alosetron (LOTRONEX) 1 MG tablet Take 1 tablet (1 mg total) by mouth 2 (two) times daily. 60 tablet 5  . ALPRAZolam (XANAX) 0.5 MG tablet Take half to one tab a day prn only for acute anxiety or panic attack 30 tablet 0  . butalbital-acetaminophen-caffeine (FIORICET) 50-325-40 MG tablet Take 1-2 tablets by mouth every 6 (six) hours as needed for headache. 30 tablet 2  . cilostazol (PLETAL) 100 MG tablet TAKE 1 TABLET BY MOUTH TWICE A DAY 180 tablet 0  . diphenoxylate-atropine (LOMOTIL) 2.5-0.025 MG tablet Take 1 tablet by mouth 4 (four) times daily as needed for diarrhea or loose stools. 30 tablet 1  . furosemide (LASIX) 20 MG tablet Take 1 tablet (20 mg total) by mouth daily. Take once a day as needed edema 30 tablet 2  . gabapentin (NEURONTIN) 600 MG tablet Take one AM, one after lunch, and 2 QHS (Patient taking differently: Take 600-1,200 mg by mouth 3 (three) times daily. 600MG -AM 600MG -NOON 1,200MG -PM) 360 tablet 3  . loratadine (CLARITIN) 10 MG tablet Take 10 mg by mouth daily as needed for allergies.    . Methylcellulose, Laxative, (CITRUCEL PO) Take by mouth.    . metoprolol succinate (TOPROL-XL) 50 MG 24 hr tablet Take 1.5 tablets (75 mg total) by mouth at bedtime. Take with or immediately following a meal. 135 tablet 1  . montelukast (SINGULAIR) 10 MG tablet TAKE 1 TABLET BY MOUTH EVERYDAY AT BEDTIME 90 tablet 1  . Multiple Vitamins-Minerals (BARIATRIC MULTIVITAMINS/IRON PO) Take by mouth.    . ondansetron (ZOFRAN-ODT) 4 MG disintegrating tablet Take 1 to 2 tablets po TID prn nausea 45 tablet 1  . pantoprazole (PROTONIX) 40 MG tablet TAKE 1 TABLET BY MOUTH DAILY 90 tablet 1  . Probiotic Product (PROBIOTIC PO) Take by mouth.    Marland Kitchen rOPINIRole (REQUIP) 0.5 MG tablet TAKE 1 TABLET BY MOUTH AT BEDTIME, MAY INCREASE TO 2 TABS AT BEDTIME AS NEEDED/ TOLERATED 180 tablet 0  . rosuvastatin (CRESTOR) 10 MG tablet TAKE 1 TABLET BY MOUTH  EVERY DAY 90 tablet 0  . Sod Picosulfate-Mag Ox-Cit Acd (CLENPIQ) 10-3.5-12 MG-GM -GM/160ML SOLN Take 320 mLs by mouth as directed. 320 mL 0  . tiZANidine (ZANAFLEX) 4 MG tablet Take 1 tablet po BID prn muscle pain/spasms. (Patient taking differently: Take 4 mg by mouth every 6 (six) hours as needed.) 45 tablet 3  . escitalopram (LEXAPRO) 10 MG tablet TAKE ONE TAB BY MOUTH TWICE A DAY FOR DEPRESSION 180 tablet 0  . HYDROcodone-acetaminophen (NORCO) 10-325 MG tablet Take 1 tablet by mouth  at bedtime as needed. For acute and severe pain 30 tablet 0  . mirtazapine (REMERON) 15 MG tablet Take 1 tablet (15 mg total) by mouth at bedtime. For sleep 90 tablet 0   No facility-administered medications prior to visit.    Allergies  Allergen Reactions  . Flagyl [Metronidazole] Anaphylaxis and Rash  . Other   . Penicillins Anaphylaxis and Other (See Comments)    Has patient had a PCN reaction causing immediate rash, facial/tongue/throat swelling, SOB or lightheadedness with hypotension: Yes Has patient had a PCN reaction causing severe rash involving mucus membranes or skin necrosis: No Has patient had a PCN reaction that required hospitalization No Has patient had a PCN reaction occurring within the last 10 years: No If all of the above answers are "NO", then may proceed with Cephalosporin use.  Marland Kitchen Doxycycline Hives    ROS Review of Systems  Constitutional: Positive for unexpected weight change. Negative for chills, fatigue and fever.       Weight gain of three pounds since her last visit   HENT: Negative for congestion, postnasal drip, rhinorrhea, sinus pressure and sinus pain.   Eyes: Negative.   Respiratory: Negative for cough, shortness of breath and wheezing.   Cardiovascular: Positive for leg swelling. Negative for chest pain and palpitations.       Leg swelling is intermittent. She does take furosemide when needed.   Gastrointestinal: Positive for abdominal distention and diarrhea. Negative  for constipation, nausea and vomiting.       Patient is scheduled for colonoscopy on Friday   Endocrine: Negative for cold intolerance, heat intolerance, polydipsia and polyuria.       The patient's blood sugars have been more elevated with recent HgbA1c 6.1   Genitourinary: Negative.   Musculoskeletal: Negative for back pain and myalgias.  Skin: Negative for rash.  Allergic/Immunologic: Positive for environmental allergies.  Neurological: Negative for dizziness and weakness.       Severe neuropathy in both feet.   Hematological: Negative for adenopathy.  Psychiatric/Behavioral: Positive for dysphoric mood and sleep disturbance. The patient is nervous/anxious.        Dong well with current medication doses.   All other systems reviewed and are negative.     Objective:    Physical Exam Vitals and nursing note reviewed.  Constitutional:      Appearance: Normal appearance. She is well-developed. She is obese.  HENT:     Head: Normocephalic and atraumatic.  Eyes:     Extraocular Movements: Extraocular movements intact.     Conjunctiva/sclera: Conjunctivae normal.     Pupils: Pupils are equal, round, and reactive to light.  Cardiovascular:     Rate and Rhythm: Normal rate and regular rhythm.     Pulses: Normal pulses.     Heart sounds: Normal heart sounds.  Pulmonary:     Effort: Pulmonary effort is normal.     Breath sounds: Normal breath sounds.  Abdominal:     Palpations: Abdomen is soft.  Musculoskeletal:        General: Normal range of motion.     Cervical back: Normal range of motion and neck supple.  Skin:    General: Skin is warm and dry.     Capillary Refill: Capillary refill takes less than 2 seconds.  Neurological:     General: No focal deficit present.     Mental Status: She is alert and oriented to person, place, and time.  Psychiatric:        Mood  and Affect: Mood normal.        Behavior: Behavior normal.        Thought Content: Thought content normal.         Judgment: Judgment normal.     Today's Vitals   08/28/20 0849  BP: 139/85  Pulse: 85  Temp: 97.8 F (36.6 C)  SpO2: 98%  Weight: 220 lb (99.8 kg)  Height: 5\' 4"  (1.626 m)   Body mass index is 37.76 kg/m.   Wt Readings from Last 3 Encounters:  08/28/20 220 lb (99.8 kg)  08/13/20 219 lb 3.2 oz (99.4 kg)  07/24/20 216 lb 8 oz (98.2 kg)     Health Maintenance Due  Topic Date Due  . HIV Screening  Never done  . TETANUS/TDAP  Never done  . MAMMOGRAM  07/19/2020  . OPHTHALMOLOGY EXAM  08/08/2020    There are no preventive care reminders to display for this patient.  Lab Results  Component Value Date   TSH 2.080 08/07/2020   Lab Results  Component Value Date   WBC 8.5 08/07/2020   HGB 13.2 08/07/2020   HCT 39.1 08/07/2020   MCV 93 08/07/2020   PLT 316 08/07/2020   Lab Results  Component Value Date   NA 144 08/07/2020   K 4.5 08/07/2020   CO2 21 08/07/2020   GLUCOSE 114 (H) 08/07/2020   BUN 14 08/07/2020   CREATININE 0.88 08/07/2020   BILITOT 0.3 08/07/2020   ALKPHOS 115 08/07/2020   AST 18 08/07/2020   ALT 12 08/07/2020   PROT 6.6 08/07/2020   ALBUMIN 4.4 08/07/2020   CALCIUM 9.4 08/07/2020   ANIONGAP 9 02/26/2020   Lab Results  Component Value Date   CHOL 174 08/07/2020   Lab Results  Component Value Date   HDL 57 08/07/2020   Lab Results  Component Value Date   LDLCALC 75 08/07/2020   Lab Results  Component Value Date   TRIG 259 (H) 08/07/2020   Lab Results  Component Value Date   CHOLHDL 3.1 08/07/2020   Lab Results  Component Value Date   HGBA1C 6.1 (H) 08/07/2020      Assessment & Plan:  1. Prediabetes Reviewed recent labs. Glucose was 114 with HgbA1c of 6.1, which was elevated from most recent check. Will start Ozempic. She should start with dose of 0.25mg  weekly for three weeks then increase dose to 0.5mg  weekly. She should limit intake of carbohydrates and sugar in the diet and gradually incorporate exercise into her daily  routine.  - Semaglutide,0.25 or 0.5MG /DOS, (OZEMPIC, 0.25 OR 0.5 MG/DOSE,) 2 MG/1.5ML SOPN; Inject 0.5 mg into the skin once a week.  Dispense: 1.5 mL; Refill: 3  2. Palpitations Improved. Will continue with Toprol XR at 75mg  every evening  3. Essential hypertension Stable. Continue all BP medication.   4. Peripheral neuropathic pain Patient with excruciating pain in both feet, especially at night. Reviewed PDMP profile with overdose risk factor at 220 and appropriate fill history. Renew hydrocodone/APAP 10/325mg  which is to be taken at night only. Reviewed risk factors and possible side effects of taking narcotic pain medication. She voiced understanding.  - HYDROcodone-acetaminophen (NORCO) 10-325 MG tablet; Take 1 tablet by mouth at bedtime as needed. For acute and severe pain  Dispense: 30 tablet; Refill: 0  5. Recurrent major depressive disorder, in partial remission (HCC) Stable. Continue lexapro and mirtazapine as prescribed. Refills provided today.  - escitalopram (LEXAPRO) 10 MG tablet; TAKE ONE TAB BY MOUTH TWICE A DAY  FOR DEPRESSION  Dispense: 180 tablet; Refill: 1 - mirtazapine (REMERON) 15 MG tablet; Take 1 tablet (15 mg total) by mouth at bedtime. For sleep  Dispense: 90 tablet; Refill: 1  6. Irritable bowel syndrome with diarrhea Patient scheduled for colonoscopy this coming Friday, August 30, 2020. Follow up with GI provider as scheduled.    Problem List Items Addressed This Visit      Cardiovascular and Mediastinum   Essential hypertension     Digestive   Irritable bowel syndrome with diarrhea     Other   Peripheral neuropathic pain   Relevant Medications   HYDROcodone-acetaminophen (NORCO) 10-325 MG tablet   Palpitations   Prediabetes - Primary   Relevant Medications   Semaglutide,0.25 or 0.5MG /DOS, (OZEMPIC, 0.25 OR 0.5 MG/DOSE,) 2 MG/1.5ML SOPN   Recurrent major depressive disorder, in partial remission (HCC)   Relevant Medications   escitalopram (LEXAPRO)  10 MG tablet   mirtazapine (REMERON) 15 MG tablet      Meds ordered this encounter  Medications  . Semaglutide,0.25 or 0.5MG /DOS, (OZEMPIC, 0.25 OR 0.5 MG/DOSE,) 2 MG/1.5ML SOPN    Sig: Inject 0.5 mg into the skin once a week.    Dispense:  1.5 mL    Refill:  3    Order Specific Question:   Supervising Provider    Answer:   Beatrice Lecher D [[id number]]  . escitalopram (LEXAPRO) 10 MG tablet    Sig: TAKE ONE TAB BY MOUTH TWICE A DAY FOR DEPRESSION    Dispense:  180 tablet    Refill:  1    Order Specific Question:   Supervising Provider    Answer:   Beatrice Lecher D [[id number]]  . HYDROcodone-acetaminophen (NORCO) 10-325 MG tablet    Sig: Take 1 tablet by mouth at bedtime as needed. For acute and severe pain    Dispense:  30 tablet    Refill:  0    Order Specific Question:   Supervising Provider    Answer:   Beatrice Lecher D [[id number]]  . mirtazapine (REMERON) 15 MG tablet    Sig: Take 1 tablet (15 mg total) by mouth at bedtime. For sleep    Dispense:  90 tablet    Refill:  1    Order Specific Question:   Supervising Provider    Answer:   Beatrice Lecher D [[id number]]    Follow-up: Return in about 4 weeks (around 09/25/2020) for started ozempic. bp/mood.    Ronnell Freshwater, NP

## 2020-08-28 NOTE — Patient Instructions (Signed)
Semaglutide injection solution What is this medicine? SEMAGLUTIDE (Sem a GLOO tide) is used to improve blood sugar control in adults with type 2 diabetes. This medicine may be used with other diabetes medicines. This drug may also reduce the risk of heart attack or stroke if you have type 2 diabetes and risk factors for heart disease. This medicine may be used for other purposes; ask your health care provider or pharmacist if you have questions. COMMON BRAND NAME(S): OZEMPIC What should I tell my health care provider before I take this medicine? They need to know if you have any of these conditions:  endocrine tumors (MEN 2) or if someone in your family had these tumors  eye disease, vision problems  history of pancreatitis  kidney disease  stomach problems  thyroid cancer or if someone in your family had thyroid cancer  an unusual or allergic reaction to semaglutide, other medicines, foods, dyes, or preservatives  pregnant or trying to get pregnant  breast-feeding How should I use this medicine? This medicine is for injection under the skin of your upper leg (thigh), stomach area, or upper arm. It is given once every week (every 7 days). You will be taught how to prepare and give this medicine. Use exactly as directed. Take your medicine at regular intervals. Do not take it more often than directed. If you use this medicine with insulin, you should inject this medicine and the insulin separately. Do not mix them together. Do not give the injections right next to each other. Change (rotate) injection sites with each injection. It is important that you put your used needles and syringes in a special sharps container. Do not put them in a trash can. If you do not have a sharps container, call your pharmacist or healthcare provider to get one. A special MedGuide will be given to you by the pharmacist with each prescription and refill. Be sure to read this information carefully each  time. This drug comes with INSTRUCTIONS FOR USE. Ask your pharmacist for directions on how to use this drug. Read the information carefully. Talk to your pharmacist or health care provider if you have questions. Talk to your pediatrician regarding the use of this medicine in children. Special care may be needed. Overdosage: If you think you have taken too much of this medicine contact a poison control center or emergency room at once. NOTE: This medicine is only for you. Do not share this medicine with others. What if I miss a dose? If you miss a dose, take it as soon as you can within 5 days after the missed dose. Then take your next dose at your regular weekly time. If it has been longer than 5 days after the missed dose, do not take the missed dose. Take the next dose at your regular time. Do not take double or extra doses. If you have questions about a missed dose, contact your health care provider for advice. What may interact with this medicine?  other medicines for diabetes Many medications may cause changes in blood sugar, these include:  alcohol containing beverages  antiviral medicines for HIV or AIDS  aspirin and aspirin-like drugs  certain medicines for blood pressure, heart disease, irregular heart beat  chromium  diuretics  female hormones, such as estrogens or progestins, birth control pills  fenofibrate  gemfibrozil  isoniazid  lanreotide  female hormones or anabolic steroids  MAOIs like Carbex, Eldepryl, Marplan, Nardil, and Parnate  medicines for weight loss  medicines for   allergies, asthma, cold, or cough  medicines for depression, anxiety, or psychotic disturbances  niacin  nicotine  NSAIDs, medicines for pain and inflammation, like ibuprofen or naproxen  octreotide  pasireotide  pentamidine  phenytoin  probenecid  quinolone antibiotics such as ciprofloxacin, levofloxacin, ofloxacin  some herbal dietary supplements  steroid medicines  such as prednisone or cortisone  sulfamethoxazole; trimethoprim  thyroid hormones Some medications can hide the warning symptoms of low blood sugar (hypoglycemia). You may need to monitor your blood sugar more closely if you are taking one of these medications. These include:  beta-blockers, often used for high blood pressure or heart problems (examples include atenolol, metoprolol, propranolol)  clonidine  guanethidine  reserpine This list may not describe all possible interactions. Give your health care provider a list of all the medicines, herbs, non-prescription drugs, or dietary supplements you use. Also tell them if you smoke, drink alcohol, or use illegal drugs. Some items may interact with your medicine. What should I watch for while using this medicine? Visit your doctor or health care professional for regular checks on your progress. Drink plenty of fluids while taking this medicine. Check with your doctor or health care professional if you get an attack of severe diarrhea, nausea, and vomiting. The loss of too much body fluid can make it dangerous for you to take this medicine. A test called the HbA1C (A1C) will be monitored. This is a simple blood test. It measures your blood sugar control over the last 2 to 3 months. You will receive this test every 3 to 6 months. Learn how to check your blood sugar. Learn the symptoms of low and high blood sugar and how to manage them. Always carry a quick-source of sugar with you in case you have symptoms of low blood sugar. Examples include hard sugar candy or glucose tablets. Make sure others know that you can choke if you eat or drink when you develop serious symptoms of low blood sugar, such as seizures or unconsciousness. They must get medical help at once. Tell your doctor or health care professional if you have high blood sugar. You might need to change the dose of your medicine. If you are sick or exercising more than usual, you might need  to change the dose of your medicine. Do not skip meals. Ask your doctor or health care professional if you should avoid alcohol. Many nonprescription cough and cold products contain sugar or alcohol. These can affect blood sugar. Pens should never be shared. Even if the needle is changed, sharing may result in passing of viruses like hepatitis or HIV. Wear a medical ID bracelet or chain, and carry a card that describes your disease and details of your medicine and dosage times. Do not become pregnant while taking this medicine. Women should inform their doctor if they wish to become pregnant or think they might be pregnant. There is a potential for serious side effects to an unborn child. Talk to your health care professional or pharmacist for more information. What side effects may I notice from receiving this medicine? Side effects that you should report to your doctor or health care professional as soon as possible:  allergic reactions like skin rash, itching or hives, swelling of the face, lips, or tongue  breathing problems  changes in vision  diarrhea that continues or is severe  lump or swelling on the neck  severe nausea  signs and symptoms of infection like fever or chills; cough; sore throat; pain or trouble   passing urine  signs and symptoms of low blood sugar such as feeling anxious, confusion, dizziness, increased hunger, unusually weak or tired, sweating, shakiness, cold, irritable, headache, blurred vision, fast heartbeat, loss of consciousness  signs and symptoms of kidney injury like trouble passing urine or change in the amount of urine  trouble swallowing  unusual stomach upset or pain  vomiting Side effects that usually do not require medical attention (report to your doctor or health care professional if they continue or are bothersome):  constipation  diarrhea  nausea  pain, redness, or irritation at site where injected  stomach upset This list may not  describe all possible side effects. Call your doctor for medical advice about side effects. You may report side effects to FDA at 1-800-FDA-1088. Where should I keep my medicine? Keep out of the reach of children. Store unopened pens in a refrigerator between 2 and 8 degrees C (36 and 46 degrees F). Do not freeze. Protect from light and heat. After you first use the pen, it can be stored for 56 days at room temperature between 15 and 30 degrees C (59 and 86 degrees F) or in a refrigerator. Throw away your used pen after 56 days or after the expiration date, whichever comes first. Do not store your pen with the needle attached. If the needle is left on, medicine may leak from the pen. NOTE: This sheet is a summary. It may not cover all possible information. If you have questions about this medicine, talk to your doctor, pharmacist, or health care provider.  2021 Elsevier/Gold Standard (2019-01-10 09:41:51)

## 2020-08-28 NOTE — Telephone Encounter (Signed)
The prep prescription that was sent to the pharmacy for patients colonoscopy requires a prior authorization. Please send a new prescription, procedure is on Friday. -[facility]

## 2020-08-29 ENCOUNTER — Telehealth: Payer: Self-pay

## 2020-08-29 NOTE — Discharge Instructions (Signed)

## 2020-08-29 NOTE — Telephone Encounter (Signed)
Pt returned my call and instructions were given regarding [place].

## 2020-08-29 NOTE — Telephone Encounter (Signed)
Patient needs clarification on prep intake. Procedure is tomorrow. Patient wants a call before she begins at [facility].

## 2020-08-29 NOTE — Telephone Encounter (Signed)
LVM for pt to return my call.

## 2020-08-30 ENCOUNTER — Encounter
Admission: RE | Disposition: A | Discharge status: Home / Self Care | Payer: Self-pay | Source: Home / Self Care | Attending: Gastroenterology

## 2020-08-30 ENCOUNTER — Ambulatory Visit
Admission: RE | Admit: 2020-08-30 | Discharge: 2020-08-30 | Disposition: A | Discharge status: Home / Self Care | Payer: BC Managed Care – PPO | Attending: Gastroenterology | Admitting: Gastroenterology

## 2020-08-30 ENCOUNTER — Ambulatory Visit: Payer: BC Managed Care – PPO | Admitting: Anesthesiology

## 2020-08-30 ENCOUNTER — Other Ambulatory Visit: Payer: Self-pay

## 2020-08-30 ENCOUNTER — Encounter: Payer: Self-pay | Admitting: Gastroenterology

## 2020-08-30 DIAGNOSIS — Z880 Allergy status to penicillin: Secondary | ICD-10-CM | POA: Insufficient documentation

## 2020-08-30 DIAGNOSIS — Z87891 Personal history of nicotine dependence: Secondary | ICD-10-CM | POA: Diagnosis not present

## 2020-08-30 DIAGNOSIS — K635 Polyp of colon: Secondary | ICD-10-CM | POA: Diagnosis not present

## 2020-08-30 DIAGNOSIS — K219 Gastro-esophageal reflux disease without esophagitis: Secondary | ICD-10-CM | POA: Diagnosis not present

## 2020-08-30 DIAGNOSIS — Z8249 Family history of ischemic heart disease and other diseases of the circulatory system: Secondary | ICD-10-CM | POA: Diagnosis not present

## 2020-08-30 DIAGNOSIS — E119 Type 2 diabetes mellitus without complications: Secondary | ICD-10-CM | POA: Diagnosis not present

## 2020-08-30 DIAGNOSIS — Z881 Allergy status to other antibiotic agents status: Secondary | ICD-10-CM | POA: Diagnosis not present

## 2020-08-30 DIAGNOSIS — Z79899 Other long term (current) drug therapy: Secondary | ICD-10-CM | POA: Diagnosis not present

## 2020-08-30 DIAGNOSIS — I10 Essential (primary) hypertension: Secondary | ICD-10-CM | POA: Diagnosis not present

## 2020-08-30 DIAGNOSIS — R197 Diarrhea, unspecified: Secondary | ICD-10-CM | POA: Diagnosis not present

## 2020-08-30 DIAGNOSIS — K529 Noninfective gastroenteritis and colitis, unspecified: Secondary | ICD-10-CM | POA: Diagnosis not present

## 2020-08-30 DIAGNOSIS — Z809 Family history of malignant neoplasm, unspecified: Secondary | ICD-10-CM | POA: Diagnosis not present

## 2020-08-30 DIAGNOSIS — Z8349 Family history of other endocrine, nutritional and metabolic diseases: Secondary | ICD-10-CM | POA: Insufficient documentation

## 2020-08-30 DIAGNOSIS — K640 First degree hemorrhoids: Secondary | ICD-10-CM | POA: Insufficient documentation

## 2020-08-30 DIAGNOSIS — D122 Benign neoplasm of ascending colon: Secondary | ICD-10-CM | POA: Insufficient documentation

## 2020-08-30 DIAGNOSIS — Z8052 Family history of malignant neoplasm of bladder: Secondary | ICD-10-CM | POA: Diagnosis not present

## 2020-08-30 HISTORY — DX: Presence of spectacles and contact lenses: Z97.3

## 2020-08-30 HISTORY — DX: Other intervertebral disc degeneration, thoracic region: M51.34

## 2020-08-30 HISTORY — PX: POLYPECTOMY: SHX5525

## 2020-08-30 HISTORY — PX: COLONOSCOPY WITH PROPOFOL: SHX5780

## 2020-08-30 LAB — GLUCOSE, CAPILLARY
Glucose-Capillary: 118 mg/dL — ABNORMAL HIGH (ref 70–99)
Glucose-Capillary: 109 mg/dL — ABNORMAL HIGH (ref 70–99)

## 2020-08-30 SURGERY — COLONOSCOPY WITH PROPOFOL
Anesthesia: General | Site: Rectum

## 2020-08-30 MED ORDER — LIDOCAINE HCL (CARDIAC) PF 100 MG/5ML IV SOSY
PREFILLED_SYRINGE | INTRAVENOUS | Status: DC | PRN
  Administered 2020-08-30: 40 mg via INTRAVENOUS

## 2020-08-30 MED ORDER — LACTATED RINGERS IV SOLN: INTRAVENOUS | Status: DC

## 2020-08-30 MED ORDER — PROPOFOL 10 MG/ML IV BOLUS
INTRAVENOUS | Status: DC | PRN
  Administered 2020-08-30: 50 mg via INTRAVENOUS
  Administered 2020-08-30: 70 mg via INTRAVENOUS
  Administered 2020-08-30: 50 mg via INTRAVENOUS
  Administered 2020-08-30: 70 mg via INTRAVENOUS

## 2020-08-30 MED ORDER — ACETAMINOPHEN 160 MG/5ML PO SOLN: 325.0000 mg | Freq: Once | ORAL | Status: DC

## 2020-08-30 MED ORDER — STERILE WATER FOR IRRIGATION IR SOLN
Status: DC | PRN
  Administered 2020-08-30: .05 mL

## 2020-08-30 MED ORDER — ACETAMINOPHEN 325 MG PO TABS: 325.0000 mg | ORAL_TABLET | Freq: Once | ORAL | Status: DC

## 2020-08-30 SURGICAL SUPPLY — 9 items
FORCEPS BIOP RAD 4 LRG CAP 4 (CUTTING FORCEPS) ×1 IMPLANT
GOWN CVR UNV OPN BCK APRN NK (MISCELLANEOUS) ×4 IMPLANT
GOWN ISOL THUMB LOOP REG UNIV (MISCELLANEOUS) ×6
KIT PRC NS LF DISP ENDO (KITS) ×2 IMPLANT
KIT PROCEDURE OLYMPUS (KITS) ×3
MANIFOLD NEPTUNE II (INSTRUMENTS) ×3 IMPLANT
SNARE COLD EXACTO (MISCELLANEOUS) ×1 IMPLANT
TRAP ETRAP POLY (MISCELLANEOUS) ×1 IMPLANT
WATER STERILE IRR 250ML POUR (IV SOLUTION) ×3 IMPLANT

## 2020-08-30 NOTE — Anesthesia Preprocedure Evaluation (Signed)
Anesthesia Evaluation  Patient identified by MRN, date of birth, ID band Patient awake    Reviewed: Allergy & Precautions, H&P , NPO status , Patient's Chart, lab work & pertinent test results  Airway Mallampati: II  TM Distance: >3 FB Neck ROM: full    Dental no notable dental hx.    Pulmonary sleep apnea , COPD, former smoker,    Pulmonary exam normal breath sounds clear to auscultation       Cardiovascular hypertension, Normal cardiovascular exam Rhythm:regular Rate:Normal     Neuro/Psych PSYCHIATRIC DISORDERS    GI/Hepatic GERD  ,  Endo/Other  diabetesMorbid obesity  Renal/GU      Musculoskeletal   Abdominal   Peds  Hematology   Anesthesia Other Findings   Reproductive/Obstetrics                             Anesthesia Physical Anesthesia Plan  ASA: III  Anesthesia Plan: General   Post-op Pain Management:    Induction: Intravenous  PONV Risk Score and Plan: 3 and Treatment may vary due to age or medical condition, TIVA and Propofol infusion  Airway Management Planned: Natural Airway  Additional Equipment:   Intra-op Plan:   Post-operative Plan:   Informed Consent: I have reviewed the patients History and Physical, chart, labs and discussed the procedure including the risks, benefits and alternatives for the proposed anesthesia with the patient or authorized representative who has indicated his/her understanding and acceptance.     Dental Advisory Given  Plan Discussed with: CRNA  Anesthesia Plan Comments:         Anesthesia Quick Evaluation

## 2020-08-30 NOTE — Anesthesia Postprocedure Evaluation (Signed)
Anesthesia Post Note  Patient: Wendy Hubbard  Procedure(s) Performed: COLONOSCOPY WITH PROPOFOL (N/A Rectum) POLYPECTOMY (Rectum)     Patient location during evaluation: PACU Anesthesia Type: General Level of consciousness: awake and alert and oriented Pain management: satisfactory to patient Vital Signs Assessment: post-procedure vital signs reviewed and stable Respiratory status: spontaneous breathing, nonlabored ventilation and respiratory function stable Cardiovascular status: blood pressure returned to baseline and stable Postop Assessment: Adequate PO intake and No signs of nausea or vomiting Anesthetic complications: no   No complications documented.  Raliegh Ip

## 2020-08-30 NOTE — Transfer of Care (Signed)
Immediate Anesthesia Transfer of Care Note  Patient: Wendy Hubbard  Procedure(s) Performed: COLONOSCOPY WITH PROPOFOL (N/A Rectum) POLYPECTOMY (Rectum)  Patient Location: PACU  Anesthesia Type: General  Level of Consciousness: awake, alert  and patient cooperative  Airway and Oxygen Therapy: Patient Spontanous Breathing and Patient connected to supplemental oxygen  Post-op Assessment: Post-op Vital signs reviewed, Patient's Cardiovascular Status Stable, Respiratory Function Stable, Patent Airway and No signs of Nausea or vomiting  Post-op Vital Signs: Reviewed and stable  Complications: No complications documented.

## 2020-08-30 NOTE — Anesthesia Procedure Notes (Signed)
Procedure Name: MAC Date/Time: 08/30/2020 11:01 AM Performed by: Silvana Newness, CRNA Pre-anesthesia Checklist: Patient identified, Emergency Drugs available, Suction available, Patient being monitored and Timeout performed Patient Re-evaluated:Patient Re-evaluated prior to induction Oxygen Delivery Method: Nasal cannula Placement Confirmation: positive ETCO2

## 2020-08-30 NOTE — H&P (Signed)
Lucilla Lame, MD [facility] [address] [place], [place] [address] Phone:[phone] Fax : [phone]  Primary Care Physician:  Ronnell Freshwater, NP Primary Gastroenterologist:  Dr. Allen Norris  Pre-Procedure History & Physical: HPI:  Wendy Hubbard is a 65 y.o. female is here for an colonoscopy.   Past Medical History:  Diagnosis Date  . Allergy    Phreesia 07/23/2020  . Anxiety    Phreesia 07/23/2020  . COPD (chronic obstructive pulmonary disease) ([place])   . DDD (degenerative disc disease), thoracic   . Depression    Phreesia 07/23/2020  . Diabetes mellitus without complication ([place])    Phreesia 07/23/2020  . GERD (gastroesophageal reflux disease)   . Hyperlipidemia   . Hypertension   . Rapid heart rate   . Sleep apnea   . Wears contact lenses     Past Surgical History:  Procedure Laterality Date  . ABDOMINAL HYSTERECTOMY    . ANKLE ARTHROSCOPY Bilateral   . APPENDECTOMY    . BLADDER SUSPENSION    . BREAST EXCISIONAL BIOPSY Left 1990   neg  . BREAST EXCISIONAL BIOPSY Right 1989   neg  . BREAST SURGERY    . CHOLECYSTECTOMY    . COLONOSCOPY N/A 11/04/2015   Procedure: COLONOSCOPY;  Surgeon: Manya Silvas, MD;  Location: [facility] ENDOSCOPY;  Service: Endoscopy;  Laterality: N/A;  . ESOPHAGOGASTRODUODENOSCOPY N/A 11/02/2015   Procedure: ESOPHAGOGASTRODUODENOSCOPY (EGD);  Surgeon: Manya Silvas, MD;  Location: [facility] ENDOSCOPY;  Service: Endoscopy;  Laterality: N/A;  . KNEE ARTHROSCOPY Left   . REDUCTION MAMMAPLASTY Bilateral 1988  . TONSILLECTOMY      Prior to Admission medications   Medication Sig Start Date End Date Taking? Authorizing Provider  alosetron (LOTRONEX) 1 MG tablet Take 1 tablet (1 mg total) by mouth 2 (two) times daily. 08/13/20  Yes Lucilla Lame, MD  ALPRAZolam Duanne Moron) 0.5 MG tablet Take half to one tab a day prn only for acute anxiety or panic attack 02/29/20  Yes Lavera Guise, MD  cilostazol (PLETAL) 100 MG tablet TAKE 1 TABLET BY MOUTH TWICE A  DAY 07/29/20  Yes Boscia, Heather E, NP  diphenoxylate-atropine (LOMOTIL) 2.5-0.025 MG tablet Take 1 tablet by mouth 4 (four) times daily as needed for diarrhea or loose stools. 12/08/19  Yes Boscia, Greer Ee, NP  furosemide (LASIX) 20 MG tablet Take 1 tablet (20 mg total) by mouth daily. Take once a day as needed edema 08/12/20  Yes Boscia, Heather E, NP  gabapentin (NEURONTIN) 600 MG tablet Take one AM, one after lunch, and 2 QHS Patient taking differently: Take 600-1,200 mg by mouth 3 (three) times daily. 600MG -AM 600MG -NOON 1,200MG -PM 11/08/19  Yes Hyatt, Max T, DPM  loratadine (CLARITIN) 10 MG tablet Take 10 mg by mouth daily as needed for allergies.   Yes [provider]  Methylcellulose, Laxative, (CITRUCEL PO) Take by mouth.   Yes [provider]  metoprolol succinate (TOPROL-XL) 50 MG 24 hr tablet Take 1.5 tablets (75 mg total) by mouth at bedtime. Take with or immediately following a meal. 07/24/20  Yes Boscia, Heather E, NP  montelukast (SINGULAIR) 10 MG tablet TAKE 1 TABLET BY MOUTH EVERYDAY AT BEDTIME 07/30/20  Yes Boscia, Heather E, NP  Multiple Vitamins-Minerals (BARIATRIC MULTIVITAMINS/IRON PO) Take by mouth.   Yes [provider]  ondansetron (ZOFRAN-ODT) 4 MG disintegrating tablet Take 1 to 2 tablets po TID prn nausea 04/17/20  Yes Boscia, Heather E, NP  pantoprazole (PROTONIX) 40 MG tablet TAKE 1 TABLET  BY MOUTH DAILY 07/30/20  Yes Ronnell Freshwater, NP  Probiotic Product (PROBIOTIC PO) Take by mouth.   Yes [provider]  tiZANidine (ZANAFLEX) 4 MG tablet Take 1 tablet po BID prn muscle pain/spasms. Patient taking differently: Take 4 mg by mouth every 6 (six) hours as needed. 01/16/20  Yes Ronnell Freshwater, NP  butalbital-acetaminophen-caffeine (FIORICET) 50-325-40 MG tablet Take 1-2 tablets by mouth every 6 (six) hours as needed for headache. 12/22/19 12/21/20  Ronnell Freshwater, NP  escitalopram (LEXAPRO) 10 MG tablet TAKE ONE TAB BY MOUTH TWICE A DAY  FOR DEPRESSION 08/28/20   Ronnell Freshwater, NP  HYDROcodone-acetaminophen (NORCO) 10-325 MG tablet Take 1 tablet by mouth at bedtime as needed. For acute and severe pain 08/28/20   Ronnell Freshwater, NP  mirtazapine (REMERON) 15 MG tablet Take 1 tablet (15 mg total) by mouth at bedtime. For sleep 08/28/20   Ronnell Freshwater, NP  polyethylene glycol-electrolytes (GAVILYTE-N WITH FLAVOR PACK) 420 g solution Drink one 8 oz glass every 20 mins until entire container is finished starting at 5:00pm on 08/29/20 08/28/20   Lucilla Lame, MD  rOPINIRole (REQUIP) 0.5 MG tablet TAKE 1 TABLET BY MOUTH AT BEDTIME, MAY INCREASE TO 2 TABS AT BEDTIME AS NEEDED/ TOLERATED 08/26/20   Ronnell Freshwater, NP  rosuvastatin (CRESTOR) 10 MG tablet TAKE 1 TABLET BY MOUTH EVERY DAY 08/26/20   Ronnell Freshwater, NP  Semaglutide,0.25 or 0.5MG /DOS, (OZEMPIC, 0.25 OR 0.5 MG/DOSE,) 2 MG/1.5ML SOPN Inject 0.5 mg into the skin once a week. 08/28/20   Ronnell Freshwater, NP  Sod Picosulfate-Mag Ox-Cit Acd (CLENPIQ) 10-3.5-12 MG-GM -GM/160ML SOLN Take 320 mLs by mouth as directed. 08/26/20   Lucilla Lame, MD    Allergies as of 08/13/2020 - Review Complete 08/13/2020  Allergen Reaction Noted  . Flagyl [metronidazole] Anaphylaxis and Rash 08/22/2014  . Other  07/23/2020  . Penicillins Anaphylaxis and Other (See Comments) 08/22/2014  . Doxycycline Hives 08/22/2014    Family History  Problem Relation Age of Onset  . Hypertension Other   . Bladder Cancer Mother   . High Cholesterol Mother   . High blood pressure Mother   . Cancer Mother   . Heart attack Father   . High blood pressure Father   . Kidney cancer Neg Hx   . Prostate cancer Neg Hx   . Breast cancer Neg Hx     Social History   Socioeconomic History  . Marital status: Married    Spouse name: Not on file  . Number of children: Not on file  . Years of education: Not on file  . Highest education level: Not on file  Occupational History  . Not on file  Tobacco Use  .  Smoking status: Former Research scientist (life sciences)  . Smokeless tobacco: Never Used  . Tobacco comment: quit in 1996  Vaping Use  . Vaping Use: Never used  Substance and Sexual Activity  . Alcohol use: Yes    Alcohol/week: 2.0 standard drinks    Types: 1 Glasses of wine, 1 Standard drinks or equivalent per week    Comment: occasional  . Drug use: No  . Sexual activity: Yes    Partners: Male  Other Topics Concern  . Not on file  Social History Narrative  . Not on file   Social Determinants of Health   Financial Resource Strain: Not on file  Food Insecurity: Not on file  Transportation Needs: Not on file  Physical Activity: Not on file  Stress: Not on file  Social Connections: Not on file  Intimate Partner Violence: Not on file    Review of Systems: See HPI, otherwise negative ROS  Physical Exam: Ht 5\' 4"  (1.626 m)   Wt 99.3 kg   BMI 37.59 kg/m  General:   Alert,  pleasant and cooperative in NAD Head:  Normocephalic and atraumatic. Neck:  Supple; no masses or thyromegaly. Lungs:  Clear throughout to auscultation.    Heart:  Regular rate and rhythm. Abdomen:  Soft, nontender and nondistended. Normal bowel sounds, without guarding, and without rebound.   Neurologic:  Alert and  oriented x4;  grossly normal neurologically.  Impression/Plan: Tykia Mellone Mooneyhan is here for an colonoscopy to be performed for diarrhea  Risks, benefits, limitations, and alternatives regarding  colonoscopy have been reviewed with the patient.  Questions have been answered.  All parties agreeable.   Lucilla Lame, MD  08/30/2020, 10:11 AM

## 2020-08-30 NOTE — Op Note (Signed)
[facility] Gastroenterology Patient Name: Wendy Hubbard Procedure Date: 08/30/2020 10:50 AM MRN: [id number] Account #: [id number] Date of Birth: 09-23-1955 Admit Type: Outpatient Age: 65 Room: [facility] OR ROOM 01 Gender: Female Note Status: Finalized Procedure:             Colonoscopy Indications:           Chronic diarrhea Providers:             Lucilla Lame MD, MD Medicines:             Propofol per Anesthesia Complications:         No immediate complications. Procedure:             Pre-Anesthesia Assessment:                        - Prior to the procedure, a History and Physical was                         performed, and patient medications and allergies were                         reviewed. The patient's tolerance of previous                         anesthesia was also reviewed. The risks and benefits                         of the procedure and the sedation options and risks                         were discussed with the patient. All questions were                         answered, and informed consent was obtained. Prior                         Anticoagulants: The patient has taken no previous                         anticoagulant or antiplatelet agents. ASA Grade                         Assessment: II - A patient with mild systemic disease.                         After reviewing the risks and benefits, the patient                         was deemed in satisfactory condition to undergo the                         procedure.                        After obtaining informed consent, the colonoscope was                         passed under direct vision. Throughout the procedure,  the patient's blood pressure, pulse, and oxygen                         saturations were monitored continuously. The was                         introduced through the anus and advanced to the the                         terminal ileum. The colonoscopy was performed  without                         difficulty. The patient tolerated the procedure well.                         The quality of the bowel preparation was excellent. Findings:      The perianal and digital rectal examinations were normal.      A 6 mm polyp was found in the ascending colon. The polyp was sessile.       The polyp was removed with a cold snare. Resection and retrieval were       complete.      The terminal ileum appeared normal. Biopsies were taken with a cold       forceps for histology.      Non-bleeding internal hemorrhoids were found during retroflexion. The       hemorrhoids were Grade I (internal hemorrhoids that do not prolapse).      Random biopsies were obtained with cold forceps for histology randomly       in the entire colon. Impression:            - One 6 mm polyp in the ascending colon, removed with                         a cold snare. Resected and retrieved.                        - The examined portion of the ileum was normal.                         Biopsied.                        - Non-bleeding internal hemorrhoids.                        - Random biopsies were obtained in the entire colon. Recommendation:        - Discharge patient to home.                        - Resume previous diet.                        - Continue present medications.                        - Await pathology results.                        - Repeat colonoscopy in 7 years if polyp adenoma and  10 years if hyperplastic Procedure Code(s):     --- Professional ---                        [phone], Colonoscopy, flexible; with removal of                         tumor(s), polyp(s), or other lesion(s) by snare                         technique                        45380, 59, Colonoscopy, flexible; with biopsy, single                         or multiple Diagnosis Code(s):     --- Professional ---                        K52.9, Noninfective gastroenteritis and colitis,                          unspecified                        K63.5, Polyp of colon CPT copyright 2019 American Medical Association. All rights reserved. The codes documented in this report are preliminary and upon coder review may  be revised to meet current compliance requirements. Lucilla Lame MD, MD 08/30/2020 11:19:36 AM This report has been signed electronically. Number of Addenda: 0 Note Initiated On: 08/30/2020 10:50 AM Scope Withdrawal Time: 0 hours 9 minutes 24 seconds  Total Procedure Duration: 0 hours 11 minutes 58 seconds  Estimated Blood Loss:  Estimated blood loss: none.      [facility]

## 2020-09-02 ENCOUNTER — Encounter: Payer: Self-pay | Admitting: Gastroenterology

## 2020-09-02 ENCOUNTER — Telehealth: Payer: Self-pay | Admitting: Nurse Practitioner

## 2020-09-02 NOTE — Telephone Encounter (Signed)
No, patient can obtain a glucose monitor from the pharmacy.

## 2020-09-02 NOTE — Telephone Encounter (Signed)
Left voicemail for patient

## 2020-09-02 NOTE — Telephone Encounter (Signed)
Patient would like to know if we have any diabetic meters here, thanks.

## 2020-09-03 LAB — SURGICAL PATHOLOGY

## 2020-09-05 ENCOUNTER — Other Ambulatory Visit: Payer: Self-pay | Admitting: Nurse Practitioner

## 2020-09-05 ENCOUNTER — Encounter: Payer: Self-pay | Admitting: Gastroenterology

## 2020-09-05 DIAGNOSIS — Z1231 Encounter for screening mammogram for malignant neoplasm of breast: Secondary | ICD-10-CM

## 2020-09-08 NOTE — Progress Notes (Shared)
09/08/2020  10:38 PM   Wendy Hubbard 1955-10-01 [id number]  Referring provider: Lavera Guise, [place],  [place] [address] No chief complaint on file.   HPI: Wendy Hubbard is a 65 y.o. female with a personal history of ***, who presents today for    PMH: Past Medical History:  Diagnosis Date  . Allergy    Phreesia 07/23/2020  . Anxiety    Phreesia 07/23/2020  . COPD (chronic obstructive pulmonary disease) ([place])   . DDD (degenerative disc disease), thoracic   . Depression    Phreesia 07/23/2020  . Diabetes mellitus without complication ([place])    Phreesia 07/23/2020  . GERD (gastroesophageal reflux disease)   . Hyperlipidemia   . Hypertension   . Rapid heart rate   . Sleep apnea   . Wears contact lenses     Surgical History: Past Surgical History:  Procedure Laterality Date  . ABDOMINAL HYSTERECTOMY    . ANKLE ARTHROSCOPY Bilateral   . APPENDECTOMY    . BLADDER SUSPENSION    . BREAST EXCISIONAL BIOPSY Left 1990   neg  . BREAST EXCISIONAL BIOPSY Right 1989   neg  . BREAST SURGERY    . CHOLECYSTECTOMY    . COLONOSCOPY N/A 11/04/2015   Procedure: COLONOSCOPY;  Surgeon: Manya Silvas, MD;  Location: [facility] ENDOSCOPY;  Service: Endoscopy;  Laterality: N/A;  . COLONOSCOPY WITH PROPOFOL N/A 08/30/2020   Procedure: COLONOSCOPY WITH PROPOFOL;  Surgeon: Lucilla Lame, MD;  Location: [place];  Service: Endoscopy;  Laterality: N/A;  . ESOPHAGOGASTRODUODENOSCOPY N/A 11/02/2015   Procedure: ESOPHAGOGASTRODUODENOSCOPY (EGD);  Surgeon: Manya Silvas, MD;  Location: [facility] ENDOSCOPY;  Service: Endoscopy;  Laterality: N/A;  . KNEE ARTHROSCOPY Left   . POLYPECTOMY  08/30/2020   Procedure: POLYPECTOMY;  Surgeon: Lucilla Lame, MD;  Location: [place];  Service: Endoscopy;;  . REDUCTION MAMMAPLASTY Bilateral 1988  . TONSILLECTOMY      Home Medications:  Allergies as of 09/09/2020      Reactions   Flagyl [metronidazole] Anaphylaxis, Rash    Other    Penicillins Anaphylaxis, Other (See Comments)   Has patient had a PCN reaction causing immediate rash, facial/tongue/throat swelling, SOB or lightheadedness with hypotension: Yes Has patient had a PCN reaction causing severe rash involving mucus membranes or skin necrosis: No Has patient had a PCN reaction that required hospitalization No Has patient had a PCN reaction occurring within the last 10 years: No If all of the above answers are "NO", then may proceed with Cephalosporin use.   Doxycycline Hives      Medication List       Accurate as of Sep 08, 2020 10:38 PM. If you have any questions, ask your nurse or doctor.        alosetron 1 MG tablet Commonly known as: Lotronex Take 1 tablet (1 mg total) by mouth 2 (two) times daily.   ALPRAZolam 0.5 MG tablet Commonly known as: XANAX Take half to one tab a day prn only for acute anxiety or panic attack   BARIATRIC MULTIVITAMINS/IRON PO Take by mouth.   butalbital-acetaminophen-caffeine 50-325-40 MG tablet Commonly known as: FIORICET Take 1-2 tablets by mouth every 6 (six) hours as needed for headache.   cilostazol 100 MG tablet Commonly known as: PLETAL TAKE 1 TABLET BY MOUTH TWICE A DAY   CITRUCEL PO Take by mouth.   Clenpiq 10-3.5-12 MG-GM -GM/160ML Soln Generic drug: Sod Picosulfate-Mag Ox-Cit Acd Take 320 mLs by mouth as  directed.   diphenoxylate-atropine 2.5-0.025 MG tablet Commonly known as: LOMOTIL Take 1 tablet by mouth 4 (four) times daily as needed for diarrhea or loose stools.   escitalopram 10 MG tablet Commonly known as: LEXAPRO TAKE ONE TAB BY MOUTH TWICE A DAY FOR DEPRESSION   furosemide 20 MG tablet Commonly known as: LASIX Take 1 tablet (20 mg total) by mouth daily. Take once a day as needed edema   gabapentin 600 MG tablet Commonly known as: Neurontin Take one AM, one after lunch, and 2 QHS What changed:   how much to take  how to take this  when to take this  additional  instructions   HYDROcodone-acetaminophen 10-325 MG tablet Commonly known as: NORCO Take 1 tablet by mouth at bedtime as needed. For acute and severe pain   loratadine 10 MG tablet Commonly known as: CLARITIN Take 10 mg by mouth daily as needed for allergies.   metoprolol succinate 50 MG 24 hr tablet Commonly known as: TOPROL-XL Take 1.5 tablets (75 mg total) by mouth at bedtime. Take with or immediately following a meal.   mirtazapine 15 MG tablet Commonly known as: Remeron Take 1 tablet (15 mg total) by mouth at bedtime. For sleep   montelukast 10 MG tablet Commonly known as: SINGULAIR TAKE 1 TABLET BY MOUTH EVERYDAY AT BEDTIME   ondansetron 4 MG disintegrating tablet Commonly known as: ZOFRAN-ODT Take 1 to 2 tablets po TID prn nausea   Ozempic (0.25 or 0.5 MG/DOSE) 2 MG/1.5ML Sopn Generic drug: Semaglutide(0.25 or 0.5MG /DOS) Inject 0.5 mg into the skin once a week.   pantoprazole 40 MG tablet Commonly known as: PROTONIX TAKE 1 TABLET BY MOUTH DAILY   polyethylene glycol-electrolytes 420 g solution Commonly known as: GaviLyte-N with Flavor Pack Drink one 8 oz glass every 20 mins until entire container is finished starting at 5:00pm on 08/29/20   PROBIOTIC PO Take by mouth.   rOPINIRole 0.5 MG tablet Commonly known as: REQUIP TAKE 1 TABLET BY MOUTH AT BEDTIME, MAY INCREASE TO 2 TABS AT BEDTIME AS NEEDED/ TOLERATED   rosuvastatin 10 MG tablet Commonly known as: CRESTOR TAKE 1 TABLET BY MOUTH EVERY DAY   tiZANidine 4 MG tablet Commonly known as: ZANAFLEX Take 1 tablet po BID prn muscle pain/spasms. What changed:   how much to take  how to take this  when to take this  reasons to take this  additional instructions       Allergies: Allergies  Allergen Reactions  . Flagyl [Metronidazole] Anaphylaxis and Rash  . Other   . Penicillins Anaphylaxis and Other (See Comments)    Has patient had a PCN reaction causing immediate rash, facial/tongue/throat  swelling, SOB or lightheadedness with hypotension: Yes Has patient had a PCN reaction causing severe rash involving mucus membranes or skin necrosis: No Has patient had a PCN reaction that required hospitalization No Has patient had a PCN reaction occurring within the last 10 years: No If all of the above answers are "NO", then may proceed with Cephalosporin use.  Marland Kitchen Doxycycline Hives    Family History: Family History  Problem Relation Age of Onset  . Hypertension Other   . Bladder Cancer Mother   . High Cholesterol Mother   . High blood pressure Mother   . Cancer Mother   . Heart attack Father   . High blood pressure Father   . Kidney cancer Neg Hx   . Prostate cancer Neg Hx   . Breast cancer Neg Hx  Social History:   reports that she has quit smoking. She has never used smokeless tobacco. She reports current alcohol use of about 2.0 standard drinks of alcohol per week. She reports that she does not use drugs.  ROS: Pertinent ROS in HPI.  Physical Exam: There were no vitals taken for this visit.  Constitutional:  Alert and oriented, No acute distress. HEENT: [place] AT, moist mucus membranes.  Trachea midline, no masses. Cardiovascular: No clubbing, cyanosis, or edema. Respiratory: Normal respiratory effort, no increased work of breathing. ***GI: Abdomen is soft, non tender, non distended, no abdominal masses. Liver and spleen not palpable.  No hernias appreciated.  Stool sample for occult testing is not indicated. ***GU: Phallus circumcised/uncircumcised without lesions, testes descended bilaterally without masses or tenderness, spermatic cord/epididymis palpably normal bilaterally.  Vasa palpable bilaterally ***Rectal: Prostate is *** grams, no nodules, non-tender, with normal sphincter tone. Skin: No rashes, bruises or suspicious lesions. Neurologic: Grossly intact, no focal deficits, moving all 4 extremities. Psychiatric: Normal mood and affect.  Laboratory Data:  Lab  Results  Component Value Date   CREATININE 0.88 08/07/2020     No results found for: PSA   Lab Results  Component Value Date   TSH 2.080 08/07/2020     No results found for: TESTOSTERONE   Lab Results  Component Value Date   HGBA1C 6.1 (H) 08/07/2020     I have reviewed the labs.  Urinalysis    I have reviewed the labs.  Urine Culture:   I have reviewed the labs.  Pertinent Imaging:   No results found for this or any previous visit.    I have personally reviewed the images and agree with radiologist interpretation.    Assessment & Plan:      Follow Up:  No follow-ups on file.   [facility] [address] [place], [place] [address] [phone]

## 2020-09-09 ENCOUNTER — Ambulatory Visit: Payer: Self-pay | Admitting: Urology

## 2020-09-16 ENCOUNTER — Telehealth: Payer: Self-pay | Admitting: Nurse Practitioner

## 2020-09-16 ENCOUNTER — Other Ambulatory Visit: Payer: Self-pay | Admitting: Nurse Practitioner

## 2020-09-16 DIAGNOSIS — F411 Generalized anxiety disorder: Secondary | ICD-10-CM

## 2020-09-16 MED ORDER — ALPRAZOLAM 0.5 MG PO TABS: ORAL_TABLET | ORAL | 2 refills | Status: DC

## 2020-09-16 NOTE — Telephone Encounter (Signed)
Patient last seen 08/28/20 and advised to follow up in 4 weeks. Pt has apt scheduled for 09/04/20.   Last refill given 02/29/20 by Dr. Humphrey Rolls #[id number].

## 2020-09-16 NOTE — Telephone Encounter (Signed)
Patient needs her xanax sent to [facility] in [place]. They have never filled it so they need a pre authorization, thanks.

## 2020-09-16 NOTE — Telephone Encounter (Signed)
Approved patient's request for alprazolam 0.5mg  tablets daily prn and sent to [facility]. She was seen by me here on 4/20.

## 2020-09-16 NOTE — Progress Notes (Signed)
Approved patient's request for alprazolam 0.5mg  tablets daily prn and sent to [facility].

## 2020-09-17 DIAGNOSIS — H35373 Puckering of macula, bilateral: Secondary | ICD-10-CM | POA: Diagnosis not present

## 2020-09-17 DIAGNOSIS — H04223 Epiphora due to insufficient drainage, bilateral lacrimal glands: Secondary | ICD-10-CM | POA: Diagnosis not present

## 2020-09-17 DIAGNOSIS — E119 Type 2 diabetes mellitus without complications: Secondary | ICD-10-CM | POA: Diagnosis not present

## 2020-09-17 DIAGNOSIS — H1045 Other chronic allergic conjunctivitis: Secondary | ICD-10-CM | POA: Diagnosis not present

## 2020-09-30 ENCOUNTER — Encounter: Payer: Self-pay | Admitting: Nurse Practitioner

## 2020-09-30 ENCOUNTER — Telehealth: Payer: BC Managed Care – PPO | Admitting: Physician Assistant

## 2020-09-30 ENCOUNTER — Other Ambulatory Visit: Payer: Self-pay | Admitting: Nurse Practitioner

## 2020-09-30 DIAGNOSIS — U071 COVID-19: Secondary | ICD-10-CM

## 2020-09-30 DIAGNOSIS — Z20822 Contact with and (suspected) exposure to covid-19: Secondary | ICD-10-CM | POA: Diagnosis not present

## 2020-09-30 DIAGNOSIS — R0602 Shortness of breath: Secondary | ICD-10-CM

## 2020-09-30 DIAGNOSIS — M792 Neuralgia and neuritis, unspecified: Secondary | ICD-10-CM

## 2020-09-30 NOTE — Progress Notes (Signed)
Based on what you have shared with me, you need to seek an "in-person" evaluation for respiratory symptoms related to Covid 19.   Please call for an appointment:   Respiratory Clinic at [facility] [address] [place], [place]. Monday, Wednesday, Friday 5:30PM to 7:30PM  If you have severe symptoms of any kind, please seek medical care at an emergency room. Our Emergency Departments are best equipped to handle patients with severe symptoms.    . [facility] Emergency Department [place], [place], [place] [address] [phone]  . [facility] Emergency Department [place], [place], [place] [address] [phone]  . [facility] Emergency Department [place], [place], [place] [address] [phone]  . [facility] Emergency Department [address] [place], [place], [place] [address] [phone]  . [facility] Emergency Department [place], [place],  [address] [phone]   If you are having a true medical emergency please call 911.  NOTE: If you entered your credit card information for this eVisit, you will not be charged. You may see a "hold" on your card for the $35 but that hold will drop off and you will not have a charge processed.   Your e-visit answers were reviewed by a board certified advanced clinical practitioner to complete your personal care plan.  Thank you for using e-Visits  Greater than 5 minutes, yet less than 10 minutes of time have been spent researching, coordinating, and implementing care for this patient today

## 2020-10-01 ENCOUNTER — Encounter (HOSPITAL_COMMUNITY): Payer: Self-pay

## 2020-10-01 ENCOUNTER — Telehealth: Payer: Self-pay | Admitting: Nurse Practitioner

## 2020-10-01 ENCOUNTER — Other Ambulatory Visit: Payer: Self-pay

## 2020-10-01 ENCOUNTER — Other Ambulatory Visit: Payer: Self-pay | Admitting: Nurse Practitioner

## 2020-10-01 ENCOUNTER — Emergency Department (HOSPITAL_COMMUNITY)
Admission: EM | Admit: 2020-10-01 | Discharge: 2020-10-01 | Disposition: A | Discharge status: Home / Self Care | Payer: BC Managed Care – PPO | Attending: Emergency Medicine | Admitting: Emergency Medicine

## 2020-10-01 ENCOUNTER — Emergency Department (HOSPITAL_COMMUNITY): Payer: BC Managed Care – PPO

## 2020-10-01 DIAGNOSIS — J988 Other specified respiratory disorders: Secondary | ICD-10-CM

## 2020-10-01 DIAGNOSIS — R519 Headache, unspecified: Secondary | ICD-10-CM | POA: Diagnosis not present

## 2020-10-01 DIAGNOSIS — J449 Chronic obstructive pulmonary disease, unspecified: Secondary | ICD-10-CM | POA: Diagnosis not present

## 2020-10-01 DIAGNOSIS — R059 Cough, unspecified: Secondary | ICD-10-CM | POA: Diagnosis not present

## 2020-10-01 DIAGNOSIS — R0602 Shortness of breath: Secondary | ICD-10-CM

## 2020-10-01 DIAGNOSIS — Z87891 Personal history of nicotine dependence: Secondary | ICD-10-CM | POA: Diagnosis not present

## 2020-10-01 DIAGNOSIS — Z79899 Other long term (current) drug therapy: Secondary | ICD-10-CM | POA: Insufficient documentation

## 2020-10-01 DIAGNOSIS — Z8616 Personal history of COVID-19: Secondary | ICD-10-CM | POA: Diagnosis not present

## 2020-10-01 DIAGNOSIS — I10 Essential (primary) hypertension: Secondary | ICD-10-CM | POA: Insufficient documentation

## 2020-10-01 DIAGNOSIS — U071 COVID-19: Secondary | ICD-10-CM

## 2020-10-01 DIAGNOSIS — E119 Type 2 diabetes mellitus without complications: Secondary | ICD-10-CM | POA: Insufficient documentation

## 2020-10-01 LAB — COMPREHENSIVE METABOLIC PANEL
ALT: 15 U/L (ref 0–44)
AST: 27 U/L (ref 15–41)
Albumin: 3.9 g/dL (ref 3.5–5.0)
Alkaline Phosphatase: 99 U/L (ref 38–126)
Anion gap: 11 (ref 5–15)
BUN: 10 mg/dL (ref 8–23)
CO2: 25 mmol/L (ref 22–32)
Calcium: 9 mg/dL (ref 8.9–10.3)
Chloride: 102 mmol/L (ref 98–111)
Creatinine, Ser: 0.96 mg/dL (ref 0.44–1.00)
GFR, Estimated: >60 mL/min (ref >60)
Glucose, Bld: 127 mg/dL — ABNORMAL HIGH (ref 70–99)
Potassium: 4.1 mmol/L (ref 3.5–5.1)
Sodium: 138 mmol/L (ref 135–145)
Total Bilirubin: 0.6 mg/dL (ref 0.3–1.2)
Total Protein: 7.1 g/dL (ref 6.5–8.1)

## 2020-10-01 LAB — CBC WITH DIFFERENTIAL/PLATELET
Abs Immature Granulocytes: 0.02 10*3/uL (ref 0.00–0.07)
Basophils Absolute: 0 10*3/uL (ref 0.0–0.1)
Basophils Relative: 0 %
Eosinophils Absolute: 0.2 10*3/uL (ref 0.0–0.5)
Eosinophils Relative: 3 %
HCT: 42.4 % (ref 36.0–46.0)
Hemoglobin: 13.6 g/dL (ref 12.0–15.0)
Immature Granulocytes: 0 %
Lymphocytes Relative: 38 %
Lymphs Abs: 2.4 10*3/uL (ref 0.7–4.0)
MCH: 31.1 pg (ref 26.0–34.0)
MCHC: 32.1 g/dL (ref 30.0–36.0)
MCV: 97 fL (ref 80.0–100.0)
Monocytes Absolute: 0.5 10*3/uL (ref 0.1–1.0)
Monocytes Relative: 8 %
Neutro Abs: 3.2 10*3/uL (ref 1.7–7.7)
Neutrophils Relative %: 51 %
Platelets: 262 10*3/uL (ref 150–400)
RBC: 4.37 MIL/uL (ref 3.87–5.11)
RDW: 12.7 % (ref 11.5–15.5)
WBC: 6.3 10*3/uL (ref 4.0–10.5)
nRBC: 0 % (ref 0.0–0.2)

## 2020-10-01 LAB — RESP PANEL BY RT-PCR (FLU A&B, COVID) ARPGX2
Influenza A by PCR: NEGATIVE
Influenza B by PCR: NEGATIVE
SARS Coronavirus 2 by RT PCR: POSITIVE — AB

## 2020-10-01 MED ORDER — BENZONATATE 200 MG PO CAPS: 200.0000 mg | ORAL_CAPSULE | Freq: Two times a day (BID) | ORAL | 0 refills | Status: DC | PRN

## 2020-10-01 MED ORDER — METHYLPREDNISOLONE 4 MG PO TBPK
ORAL_TABLET | ORAL | 0 refills | Status: DC
Start: 2020-10-01 — End: 2020-10-11

## 2020-10-01 MED ORDER — NIRMATRELVIR/RITONAVIR (PAXLOVID)TABLET: 3.0000 | ORAL_TABLET | Freq: Two times a day (BID) | ORAL | 0 refills | Status: AC

## 2020-10-01 MED ORDER — NIRMATRELVIR/RITONAVIR (PAXLOVID)TABLET: 3.0000 | ORAL_TABLET | Freq: Two times a day (BID) | ORAL | 0 refills | Status: DC

## 2020-10-01 MED ORDER — ALBUTEROL SULFATE HFA 108 (90 BASE) MCG/ACT IN AERS
2.0000 | INHALATION_SPRAY | Freq: Once | RESPIRATORY_TRACT | Status: AC
  Administered 2020-10-01: 2 via RESPIRATORY_TRACT
  Filled 2020-10-01: qty 6.7

## 2020-10-01 NOTE — ED Provider Notes (Signed)
[facility] DEPT Provider Note   CSN: [id number] Arrival date & time: 10/01/20  1806     History Chief Complaint  Patient presents with  . Covid Positive  . Cough  . Headache    Wendy Hubbard is a 65 y.o. female.  Patient tested herself on Saturday for COVID which was positive.  She started with symptoms on Friday.  She has cough mild shortness of breath.  Patient was told by her doctor to come to the emergency department  The history is provided by the patient. No language interpreter was used.  Cough Cough characteristics:  Non-productive Sputum characteristics:  Unable to specify Severity:  Moderate Onset quality:  Sudden Timing:  Constant Progression:  Waxing and waning Chronicity:  New Smoker: no   Associated symptoms: headaches   Associated symptoms: no chest pain, no eye discharge and no rash   Headache Associated symptoms: cough   Associated symptoms: no abdominal pain, no back pain, no congestion, no diarrhea, no fatigue, no seizures and no sinus pressure        Past Medical History:  Diagnosis Date  . Allergy    Phreesia 07/23/2020  . Anxiety    Phreesia 07/23/2020  . COPD (chronic obstructive pulmonary disease) ([place])   . DDD (degenerative disc disease), thoracic   . Depression    Phreesia 07/23/2020  . Diabetes mellitus without complication ([place])    Phreesia 07/23/2020  . GERD (gastroesophageal reflux disease)   . Hyperlipidemia   . Hypertension   . Rapid heart rate   . Sleep apnea   . Wears contact lenses     Patient Active Problem List   Diagnosis Date Noted  . Noninfectious diarrhea   . Polyp of ascending colon   . Prediabetes 08/28/2020  . Recurrent major depressive disorder, in partial remission ([place]) 08/28/2020  . Palpitations 07/29/2020  . Encounter to establish care 07/29/2020  . Restless legs syndrome 05/30/2020  . Intermittent claudication ([place]) 05/08/2020  . Aortic atherosclerosis ([place]) 05/08/2020   . Functional diarrhea 03/07/2020  . Irritable bowel syndrome with diarrhea 03/07/2020  . Rhomboid muscle pain 10/25/2019  . Vasomotor rhinitis 05/25/2019  . Urinary tract infection without hematuria 02/27/2019  . Close exposure to COVID-19 virus 02/13/2019  . Generalized anxiety disorder 07/27/2018  . Uncontrolled type 2 diabetes mellitus with hyperglycemia ([place]) 07/27/2018  . Vitamin D deficiency 07/27/2018  . Acute upper respiratory infection 02/28/2018  . Cough 02/28/2018  . Sore throat 02/28/2018  . Seasonal allergic rhinitis due to pollen 02/28/2018  . Migraine without aura and without status migrainosus, not intractable 12/21/2017  . Essential hypertension 12/21/2017  . Pain in both feet 08/21/2017  . Peripheral neuropathic pain 08/21/2017  . Urinary tract infection with hematuria 08/21/2017  . Dysuria 08/21/2017  . Screening for breast cancer 08/21/2017  . Status post bariatric surgery 01/05/2017  . Benign essential HTN 12/05/2015  . Mixed hyperlipidemia 12/05/2015  . Shortness of breath 12/05/2015  . Cobalamin deficiency 11/18/2015  . Gastroesophageal reflux disease 10/22/2015  . Morbid obesity ([place]) 10/22/2015  . Obstructive sleep apnea syndrome 10/22/2015  . Diabetes mellitus ([place]) 10/22/2015  . Primary osteoarthritis of left knee 10/04/2014  . Left knee pain 09/06/2014    Past Surgical History:  Procedure Laterality Date  . ABDOMINAL HYSTERECTOMY    . ANKLE ARTHROSCOPY Bilateral   . APPENDECTOMY    . BLADDER SUSPENSION    . BREAST EXCISIONAL BIOPSY Left 1990   neg  . BREAST EXCISIONAL BIOPSY  Right 1989   neg  . BREAST SURGERY    . CHOLECYSTECTOMY    . COLONOSCOPY N/A 11/04/2015   Procedure: COLONOSCOPY;  Surgeon: Manya Silvas, MD;  Location[facility] ENDOSCOPY;  Service: Endoscopy;  Laterality: N/A;  . COLONOSCOPY WITH PROPOFOL N/A 08/30/2020   Procedure: COLONOSCOPY WITH PROPOFOL;  Surgeon: Lucilla Lame, MD;  Location: [facility];  Service:  Endoscopy;  Laterality: N/A;  . ESOPHAGOGASTRODUODENOSCOPY N/A 11/02/2015   Procedure: ESOPHAGOGASTRODUODENOSCOPY (EGD);  Surgeon: Manya Silvas, MD;  Location: [facility] ENDOSCOPY;  Service: Endoscopy;  Laterality: N/A;  . KNEE ARTHROSCOPY Left   . POLYPECTOMY  08/30/2020   Procedure: POLYPECTOMY;  Surgeon: Lucilla Lame, MD;  Location: [facility];  Service: Endoscopy;;  . REDUCTION MAMMAPLASTY Bilateral 1988  . TONSILLECTOMY       OB History   No obstetric history on file.     Family History  Problem Relation Age of Onset  . Hypertension Other   . Bladder Cancer Mother   . High Cholesterol Mother   . High blood pressure Mother   . Cancer Mother   . Heart attack Father   . High blood pressure Father   . Kidney cancer Neg Hx   . Prostate cancer Neg Hx   . Breast cancer Neg Hx     Social History   Tobacco Use  . Smoking status: Former Research scientist (life sciences)  . Smokeless tobacco: Never Used  . Tobacco comment: quit in 1996  Vaping Use  . Vaping Use: Never used  Substance Use Topics  . Alcohol use: Yes    Alcohol/week: 2.0 standard drinks    Types: 1 Glasses of wine, 1 Standard drinks or equivalent per week    Comment: occasional  . Drug use: No    Home Medications Prior to Admission medications   Medication Sig Start Date End Date Taking? Authorizing Provider  nirmatrelvir/ritonavir EUA (PAXLOVID) TABS Take 3 tablets by mouth 2 (two) times daily for 5 days. Patient GFR is > 60 Take nirmatrelvir (150 mg) two tablets twice daily for 5 days and ritonavir (100 mg) one tablet twice daily for 5 days. 10/01/20 10/06/20 Yes Milton Ferguson, MD  alosetron (LOTRONEX) 1 MG tablet Take 1 tablet (1 mg total) by mouth 2 (two) times daily. 08/13/20   Lucilla Lame, MD  ALPRAZolam Duanne Moron) 0.5 MG tablet Take half to one tab a day prn only for acute anxiety or panic attack 09/16/20   Ronnell Freshwater, NP  benzonatate (TESSALON) 200 MG capsule Take 1 capsule (200 mg total) by mouth 2 (two) times daily as  needed for cough. 10/01/20   Ronnell Freshwater, NP  butalbital-acetaminophen-caffeine (FIORICET) [phone] MG tablet Take 1-2 tablets by mouth every 6 (six) hours as needed for headache. 12/22/19 12/21/20  Ronnell Freshwater, NP  cilostazol (PLETAL) 100 MG tablet TAKE 1 TABLET BY MOUTH TWICE A DAY 07/29/20   Boscia, Greer Ee, NP  diphenoxylate-atropine (LOMOTIL) 2.5-0.025 MG tablet Take 1 tablet by mouth 4 (four) times daily as needed for diarrhea or loose stools. 12/08/19   Ronnell Freshwater, NP  escitalopram (LEXAPRO) 10 MG tablet TAKE ONE TAB BY MOUTH TWICE A DAY FOR DEPRESSION 08/28/20   Ronnell Freshwater, NP  furosemide (LASIX) 20 MG tablet Take 1 tablet (20 mg total) by mouth daily. Take once a day as needed edema 08/12/20   Ronnell Freshwater, NP  gabapentin (NEURONTIN) 600 MG tablet Take one AM, one after lunch, and 2 QHS Patient taking differently:  Take 600-1,200 mg by mouth 3 (three) times daily. 600MG -AM 600MG -NOON 1,200MG -PM 11/08/19   Hyatt, Max T, DPM  HYDROcodone-acetaminophen (NORCO) 10-325 MG tablet TAKE 1 TABLET BY MOUTH AT BEDTIME AS NEEDED ACUTE/SEVERE PAIN 09/30/20   Ronnell Freshwater, NP  loratadine (CLARITIN) 10 MG tablet Take 10 mg by mouth daily as needed for allergies.    [provider]  Methylcellulose, Laxative, (CITRUCEL PO) Take by mouth.    [provider]  methylPREDNISolone (MEDROL) 4 MG TBPK tablet Take by mouth as directed for 6 days 10/01/20   Ronnell Freshwater, NP  metoprolol succinate (TOPROL-XL) 50 MG 24 hr tablet Take 1.5 tablets (75 mg total) by mouth at bedtime. Take with or immediately following a meal. 07/24/20   Boscia, Greer Ee, NP  mirtazapine (REMERON) 15 MG tablet Take 1 tablet (15 mg total) by mouth at bedtime. For sleep 08/28/20   Ronnell Freshwater, NP  montelukast (SINGULAIR) 10 MG tablet TAKE 1 TABLET BY MOUTH EVERYDAY AT BEDTIME 07/30/20   Ronnell Freshwater, NP  Multiple Vitamins-Minerals (BARIATRIC MULTIVITAMINS/IRON PO) Take by mouth.     [provider]  ondansetron (ZOFRAN-ODT) 4 MG disintegrating tablet Take 1 to 2 tablets po TID prn nausea 04/17/20   Ronnell Freshwater, NP  pantoprazole (PROTONIX) 40 MG tablet TAKE 1 TABLET BY MOUTH DAILY 07/30/20   Ronnell Freshwater, NP  polyethylene glycol-electrolytes (GAVILYTE-N WITH FLAVOR PACK) 420 g solution Drink one 8 oz glass every 20 mins until entire container is finished starting at 5:00pm on 08/29/20 08/28/20   Lucilla Lame, MD  Probiotic Product (PROBIOTIC PO) Take by mouth.    [provider]  rOPINIRole (REQUIP) 0.5 MG tablet TAKE 1 TABLET BY MOUTH AT BEDTIME, MAY INCREASE TO 2 TABS AT BEDTIME AS NEEDED/ TOLERATED 08/26/20   Ronnell Freshwater, NP  rosuvastatin (CRESTOR) 10 MG tablet TAKE 1 TABLET BY MOUTH EVERY DAY 08/26/20   Ronnell Freshwater, NP  Semaglutide,0.25 or 0.5MG /DOS, (OZEMPIC, 0.25 OR 0.5 MG/DOSE,) 2 MG/1.5ML SOPN Inject 0.5 mg into the skin once a week. 08/28/20   Ronnell Freshwater, NP  Sod Picosulfate-Mag Ox-Cit Acd (CLENPIQ) 10-3.5-12 MG-GM -GM/160ML SOLN Take 320 mLs by mouth as directed. 08/26/20   Lucilla Lame, MD  tiZANidine (ZANAFLEX) 4 MG tablet Take 1 tablet po BID prn muscle pain/spasms. Patient taking differently: Take 4 mg by mouth every 6 (six) hours as needed. 01/16/20   Ronnell Freshwater, NP    Allergies    Flagyl [metronidazole], Other, Penicillins, and Doxycycline  Review of Systems   Review of Systems  Constitutional: Negative for appetite change and fatigue.  HENT: Negative for congestion, ear discharge and sinus pressure.   Eyes: Negative for discharge.  Respiratory: Positive for cough.   Cardiovascular: Negative for chest pain.  Gastrointestinal: Negative for abdominal pain and diarrhea.  Genitourinary: Negative for frequency and hematuria.  Musculoskeletal: Negative for back pain.  Skin: Negative for rash.  Neurological: Positive for headaches. Negative for seizures.  Psychiatric/Behavioral: Negative for hallucinations.     Physical Exam Updated Vital Signs BP 131/72   Pulse 80   Temp 98.3 F (36.8 C) (Oral)   Resp 18   Ht 5\' 4"  (1.626 m)   Wt 97.1 kg   SpO2 99%   BMI 36.73 kg/m   Physical Exam Vitals and nursing note reviewed.  Constitutional:      Appearance: She is well-developed.  HENT:     Head: Normocephalic.     Nose:  Nose normal.  Eyes:     General: No scleral icterus.    Conjunctiva/sclera: Conjunctivae normal.  Neck:     Thyroid: No thyromegaly.  Cardiovascular:     Rate and Rhythm: Normal rate and regular rhythm.     Heart sounds: No murmur heard. No friction rub. No gallop.   Pulmonary:     Breath sounds: No stridor. No wheezing or rales.  Chest:     Chest wall: No tenderness.  Abdominal:     General: There is no distension.     Tenderness: There is no abdominal tenderness. There is no rebound.  Musculoskeletal:        General: Normal range of motion.     Cervical back: Neck supple.  Lymphadenopathy:     Cervical: No cervical adenopathy.  Skin:    Findings: No erythema or rash.  Neurological:     Mental Status: She is alert and oriented to person, place, and time.     Motor: No abnormal muscle tone.     Coordination: Coordination normal.  Psychiatric:        Behavior: Behavior normal.     ED Results / Procedures / Treatments   Labs (all labs ordered are listed, but only abnormal results are displayed) Labs Reviewed  RESP PANEL BY RT-PCR (FLU A&B, COVID) ARPGX2 - Abnormal; Notable for the following components:      Result Value   SARS Coronavirus 2 by RT PCR POSITIVE (*)    All other components within normal limits  COMPREHENSIVE METABOLIC PANEL - Abnormal; Notable for the following components:   Glucose, Bld 127 (*)    All other components within normal limits  CBC WITH DIFFERENTIAL/PLATELET    EKG None  Radiology DG Chest Port 1 View  Result Date: 10/01/2020 CLINICAL DATA:  65 year old female with history of headache and cough. Tested positive  for COVID 3 days ago. EXAM: PORTABLE CHEST 1 VIEW COMPARISON:  Chest x-ray 07/14/2018. FINDINGS: Lung volumes are normal. No consolidative airspace disease. No pleural effusions. No pneumothorax. No pulmonary nodule or mass noted. Pulmonary vasculature and the cardiomediastinal silhouette are within normal limits. Atherosclerotic calcifications in the thoracic aorta. IMPRESSION: 1.  No radiographic evidence of acute cardiopulmonary disease. 2. Aortic atherosclerosis. Electronically Signed   By: Vinnie Langton M.D.   On: 10/01/2020 19:43    Procedures Procedures   Medications Ordered in ED Medications  albuterol (VENTOLIN HFA) 108 (90 Base) MCG/ACT inhaler 2 puff (2 puffs Inhalation Given 10/01/20 1851)    ED Course  I have reviewed the triage vital signs and the nursing notes.  Pertinent labs & imaging results that were available during my care of the patient were reviewed by me and considered in my medical decision making (see chart for details).    MDM Rules/Calculators/A&P                         Patient nontoxic not hypoxic.  Patient had COVID-19 and is given a prescription of baclofen Final Clinical Impression(s) / ED Diagnoses Final diagnoses:  COVID-19    Rx / DC Orders ED Discharge Orders         Ordered    nirmatrelvir/ritonavir EUA (PAXLOVID) TABS  2 times daily        10/01/20 2209           Milton Ferguson, MD 10/01/20 2214

## 2020-10-01 NOTE — Telephone Encounter (Signed)
Patient has COVID and would like the antibody infusion is possible. Fever, horrible cough, achy, headache.    Please advise, thanks.

## 2020-10-01 NOTE — ED Notes (Signed)
An After Visit Summary was printed and given to the patient. Discharge instructions given and no further questions at this time.  

## 2020-10-01 NOTE — Discharge Instructions (Addendum)
Go ahead and get your medicine tonight at the [facility] on [address].  Use your inhaler every 4-6 hours as needed for shortness of breath.  Take Tylenol or Motrin for pain and follow-up with your doctor if problems

## 2020-10-01 NOTE — Progress Notes (Signed)
Patient with positive COVID 19. Sent medrol dose pack. Take as directed for 6 days and tessalon perls to take up to three times daily as needed for cough. Prescriptions sent to [facility].

## 2020-10-01 NOTE — ED Triage Notes (Signed)
Patient states she tested Covid + 3 days ago. patient c/o headache and cough.

## 2020-10-01 NOTE — Telephone Encounter (Signed)
Pt complaining of some SOB, feeling extremely fatigued. Pt advised per PA she had e-visit with yesterday she was suggested to have in person evaluation At this point with patient having some SOB I suggested patient go to ED for evaluation and treatment. Pt was agreeable. Pt was going to call 911 and have them come get her. AS, CMA        Muthersbaugh, Jarrett Soho, PA-C  Emergency Medicine  COVID-19 +1 more  Dx    Conversation: E-Visit for Covid 19 Positive Test Follow up (Newest Message First)  Muthersbaugh, Jarrett Soho, PA-C to Wendy Hubbard, Wendy Hubbard      09/30/20 9:29 AM Thank you for allowing [place] to care for you during your recent e-Visit. We hope that this message finds you feeling better.  Please consider helping [place] provide better care by taking 5 minutes to answer some questions about your e-Visit experience. The short survey is attached to this message.  If you have any questions or concerns about your e-Visit, please contact [email or web address].  Thank you again for allowing [place] to care for you.  Sincerely,  The [facility] e-Visit Team  Please do not reply to this message as the mailbox is not monitored. If you have questions for your e-Visit provider please reply using the E-Visit submission message.   Last read by Wendy Hubbard at 11:10 AM on 09/30/2020.  Muthersbaugh, Jarrett Soho, PA-C to Wendy Hubbard, Wendy Hubbard      09/30/20 9:29 AM Based on what you have shared with me, you need to seek an "in-person" evaluation for respiratory symptoms related to Covid 19.   Please call for an appointment:   Respiratory Clinic at [facility] [address] [place], [place]. Monday, Wednesday, Friday 5:30PM to 7:30PM  If you have severe symptoms of any kind, please seek medical care at an emergency room. Our Emergency Departments are best equipped to handle patients with severe symptoms.    [facility] Emergency Department [place], [place], [place] [address] [phone]  [facility] Emergency Department [place], [place], [place] [address] [facility] Emergency Department [place], [place], [place] [address] [phone]  [facility] Emergency Department [place], [place], [place] [address] [facility] Emergency Department [place], [place], [place] [address] [phone]   If you are having a true medical emergency please call 911.  NOTE: If you entered your credit card information for this eVisit, you will not be charged. You may see a "hold" on your card for the $35 but that hold will drop off and you will not have a charge processed.   Your e-visit answers were reviewed by a board certified advanced clinical practitioner to complete your personal care plan.  Thank you for using e-Visits  Last read by Wendy Hubbard at 9:13 AM on 10/01/2020.  Muthersbaugh, Gwenlyn Perking     09/30/20 9:28 AM Note Based on what you have shared with me, you need to seek an "in-person" evaluation for respiratory symptoms related to Covid 19.   Please call for an appointment:   Respiratory Clinic at [facility] [address] [place], [place]. Monday, Wednesday, Friday 5:30PM to 7:30PM  If you have severe symptoms of any kind, please seek medical care at an emergency room. Our Emergency Departments are best equipped to handle patients with  severe symptoms.     [facility] Emergency Department [place], [place], [place] [address] [phone]   [facility] Emergency Department [place], [place], [place] [address] [facility] Emergency Department [place], [place], [place] [address] [phone]   [facility] Emergency Department [place], [place], [place] [address] [facility] Emergency Department [place], [place], [place] [address] [phone]   If you are having a true medical emergency please call 911.  NOTE: If you entered your credit card information for this eVisit, you will not be charged. You may see a "hold" on your card for the $35 but that hold will drop off and you will not have a charge processed.   Your e-visit answers were reviewed by a board certified advanced clinical practitioner to complete your personal care plan.  Thank you for using e-Visits  Greater than 5 minutes, yet less than 10 minutes of time have been spent researching, coordinating, and implementing care for this patient today      Wendy Hubbard, Wendy Hubbard to [employer]      09/30/20 9:20 AM E-Visit for Covid 19 Positive Test Follow up --------------------------------  Question: I understand that this eVisit REQUIRES a positive Covid test result in my medical record and will upload a copy if one is not already in my [place] record. Answer:   Yes  Question: I understand that the evisit providers will not prescribe Ivermectin, Hydroxychloroquine or Zithromax for treatment and will not be able to order home oxygen therapy Answer:   Yes  Question: Please enter the date of your Covid test Answer:   [id number]  Question: Please indicate your covid test result Answer:   Positive (Covid detected)  Question: What is the date that your symptoms started? Answer:   [id number]  Question: Do you have any of the following?  Answer:   Shortness of breath            Cough            Fever  Question: Do you have any of the following additional symptoms?  Answer:   Body aches            Headache  Question: If you are experiencing trouble breathing please select the severity of this:  Answer:   I have mild trouble breathing but not very  often  Question: If you have a Pulse Ox monitor, please tell us what your oxygen levels are reading  Answer:     Question: Have you had a fever? Answer:   Yes  Question: If you are running a fever, what's been the highest temperature reading since your symptoms started? Answer:   101.2  Question: How [place] have you had the fever? Answer:   For a few days  Question: Have you been taking any medications? Answer:   Yes  Question: If taking medications for these symptoms, please list the names and whether they are helping or not Answer:   Mucinex  Question: Are you treated for any of the following conditions: Asthma, COPD, Diabetes, Renal Failure (on Dialysis), AIDS, any Neuromuscular disease that effects the clearing of secretions, Heart Failure, or Heart Disease? Answer:   Yes  Question: Please describe any other history of lung or breathing problems  Answer:     Question: Do you use any of  the following: Answer:   None of these  Question: Please enter your current weight Answer:   214  Question: Please enter your current height Answer:   5'4'  Question: Have you received the Covid-19 vaccine? Answer:   Yes  Question: Please type in how many doses you have you received? (1, 2 or 3?) Answer:   Phiser, 2  Question: Please type in the month you received the LAST dose of Covid vacccine Answer:   April  Question: Please list your medication allergies that you may have ? (If 'none' , please list as 'none') Answer:   Flagyl, doxycycline,pennecillian  Question: Do you need a note for work? Answer:   Yes  Question: Do you have any additional questions or concerns?  Answer:    Attachments  [id number]     [facility] Mychart Positive Covid Follow Up  Question 09/30/2020 9:20 AM EDT - Danley Danker by Patient  I understand that this eVisit REQUIRES a positive Covid test result in my medical record and will upload a copy if one is not  already in my [place] record. Yes  I understand that the evisit providers will not prescribe Ivermectin, Hydroxychloroquine or Zithromax for treatment and will not be able to order home oxygen therapy Yes  Please enter the date of your Covid test [id number]  Please indicate your covid test result Positive (Covid detected)  What is the date that your symptoms started? [id number]  Do you have any of the following?  Shortness of breath   Cough   Fever  Do you have any of the following additional symptoms?  Body aches   Headache  If you are experiencing trouble breathing please select the severity of this:  I have mild trouble breathing but not very often  If you have a Pulse Ox monitor, please tell us what your oxygen levels are reading    Have you had a fever? Yes  If you are running a fever, what's been the highest temperature reading since your symptoms started? 101.2  How [place] have you had the fever? For a few days  Have you been taking any medications? Yes  If taking medications for these symptoms, please list the names and whether they are helping or not Mucinex  Are you treated for any of the following conditions: Asthma, COPD, Diabetes, Renal Failure (on Dialysis), AIDS, any Neuromuscular disease that effects the clearing of secretions, Heart Failure, or Heart Disease? Yes  Please describe any other history of lung or breathing problems    Do you use any of the following: None of these  Please enter your current weight 214  Please enter your current height 5'4'  Have you received the Covid-19 vaccine? Yes  Please type in how many doses you have you received? (1, 2 or 3?) Phiser, 2  Please type in the month you received the LAST dose of Covid vacccine April  Please list your medication allergies that you may have ? (If 'none' , please list as 'none') Flagyl, doxycycline,pennecillian  Do you need a note for work? Yes  Do you have any additional questions or concerns?      Instructions   After Visit Summary (Automatic SnapShot taken 09/30/2020)    Additional Documentation  Flowsheets:  e-Visit Confirmation     Encounter Info:  Billing Info,   History,   Allergies,   Detailed Report      Media

## 2020-10-02 ENCOUNTER — Ambulatory Visit: Payer: Self-pay | Admitting: Urology

## 2020-10-04 ENCOUNTER — Ambulatory Visit: Payer: BC Managed Care – PPO | Admitting: Nurse Practitioner

## 2020-10-11 ENCOUNTER — Encounter: Payer: Self-pay | Admitting: Nurse Practitioner

## 2020-10-11 ENCOUNTER — Other Ambulatory Visit: Payer: Self-pay

## 2020-10-11 ENCOUNTER — Ambulatory Visit (INDEPENDENT_AMBULATORY_CARE_PROVIDER_SITE_OTHER): Payer: BC Managed Care – PPO | Admitting: Nurse Practitioner

## 2020-10-11 VITALS — BP 106/66 | HR 95 | Temp 97.7°F | Ht 64.0 in | Wt 216.5 lb

## 2020-10-11 DIAGNOSIS — U071 COVID-19: Secondary | ICD-10-CM

## 2020-10-11 DIAGNOSIS — M792 Neuralgia and neuritis, unspecified: Secondary | ICD-10-CM

## 2020-10-11 DIAGNOSIS — F3341 Major depressive disorder, recurrent, in partial remission: Secondary | ICD-10-CM

## 2020-10-11 DIAGNOSIS — R7303 Prediabetes: Secondary | ICD-10-CM

## 2020-10-11 MED ORDER — OZEMPIC (1 MG/DOSE) 4 MG/3ML ~~LOC~~ SOPN: 1.0000 mg | PEN_INJECTOR | SUBCUTANEOUS | 2 refills | Status: DC

## 2020-10-11 MED ORDER — HYDROCODONE-ACETAMINOPHEN 10-325 MG PO TABS
1.0000 | ORAL_TABLET | Freq: Two times a day (BID) | ORAL | 0 refills | Status: DC | PRN
Start: 2020-10-11 — End: 2020-11-21

## 2020-10-11 NOTE — Progress Notes (Signed)
Established Patient Office Visit  Subjective:  Patient ID: Wendy Hubbard, female    DOB: [birth year]  Age: 65 y.o. MRN: [id number]  CC:  Chief Complaint  Patient presents with   Follow-up    HPI Wendy Hubbard presents for routine follow up. Since her most recent visit, she has had bout of COVID 19. She did make trip to ER. She had negative chest x-ray and normal labs. She did take antiviral medication prescribed for acute infection with COVID 19. She has completed her recommended isolation period. Still has some resicual fatige and fogginess in her thoughts. She is gradually improving.  At her most recent visit, started ozempic at 0.85m weekly. This helps her manage blood sugars and weight managemetn. She has lost four pounds since she started this medication. She state sthat since she did have COVID 19, she has noted an increase in her blood sugars.  She does need refill for her hydrocodone/APAP 10/3238mtablets. She takes this medication only at night as the pain is most severe at night. Has been so bad that sheets cannot touch her feet, the pain is excruciating. Reviewed her PDMP profile today and Overdose Risk Score is 300. Her last fill of hydrocodone was 09/30/2020.   Past Medical History:  Diagnosis Date   Allergy    Phreesia 07/23/2020   Anxiety    Phreesia 07/23/2020   COPD (chronic obstructive pulmonary disease) (HCC)    DDD (degenerative disc disease), thoracic    Depression    Phreesia 07/23/2020   Di[place] mellitus without complication ([place]   Phreesia 07/23/2020   GERD (gastroesophageal reflux disease)    Hyperlipidemia    Hypertension    Rapid heart rate    Sleep apnea    Wears contact lenses     Past Surgical History:  Procedure Laterality Date   ABDOMINAL HYSTERECTOMY     ANKLE ARTHROSCOPY Bilateral    APPENDECTOMY     BLADDER SUSPENSION     BREAST EXCISIONAL BIOPSY Left 1990   neg   BREAST EXCISIONAL BIOPSY Right 1989   neg   BREAST SURGERY      CHOLECYSTECTOMY     COLONOSCOPY N/A 11/04/2015   Procedure: COLONOSCOPY;  Surgeon: RoManya SilvasMD;  [place] [facility];  Laterality: N/A;   COLONOSCOPY WITH PROPOFOL N/A 08/30/2020   Procedure: COLONOSCOPY WITH PROPOFOL;  Surgeon: WoLucilla LameMD;  [place];  Laterality: N/A;   ESOPHAGOGASTRODUODENOSCOPY N/A 11/02/2015   Procedure: ESOPHAGOGASTRODUODENOSCOPY (EGD);  Surgeon: RoManya SilvasMD;  [facility];  Service: Endoscopy;  Laterality: N/A;   KNEE ARTHROSCOPY Left    POLYPECTOMY  08/30/2020   Procedure: POLYPECTOMY;  Surgeon: WoLucilla LameMD;  [facility];  Service: Endoscopy;;   REDUCTION MAMMAPLASTY Bilateral 1988   TONSILLECTOMY      Family History  Problem Relation Age of Onset   Hypertension Other    Bladder Cancer Mother    High Cholesterol Mother    High blood pressure Mother    Cancer Mother    Heart attack Father    High blood pressure Father    Kidney cancer Neg Hx    Prostate cancer Neg Hx    Breast cancer Neg Hx     Social History   Socioeconomic History   Marital status: Married    Spouse name: Not on file   Number of children: Not on file   Years of education: Not  on file   Highest education level: Not on file  Occupational History   Not on file  Tobacco Use   Smoking status: Former    Pack years: 0.00   Smokeless tobacco: Never   Tobacco comments:    quit in 1996  Vaping Use   Vaping Use: Never used  Substance and Sexual Activity   Alcohol use: Yes    Alcohol/week: 2.0 standard drinks    Types: 1 Glasses of wine, 1 Standard drinks or equivalent per week    Comment: occasional   Drug use: No   Sexual activity: Yes    Partners: Male  Other Topics Concern   Not on file  Social History Narrative   Not on file   Social Determinants of Health   Financial Resource Strain: Not on file  Food Insecurity: Not on file  Transportation Needs:  Not on file  Physical Activity: Not on file  Stress: Not on file  Social Connections: Not on file  Intimate Partner Violence: Not on file    Outpatient Medications Prior to Visit  Medication Sig Dispense Refill   alosetron (LOTRONEX) 1 MG tablet Take 1 tablet (1 mg total) by mouth 2 (two) times daily. 60 tablet 5   ALPRAZolam (XANAX) 0.5 MG tablet Take half to one tab a day prn only for acute anxiety or panic attack 30 tablet 2   butalbital-acetaminophen-caffeine (FIORICET) 50-325-40 MG tablet Take 1-2 tablets by mouth every 6 (six) hours as needed for headache. 30 tablet 2   cilostazol (PLETAL) 100 MG tablet TAKE 1 TABLET BY MOUTH TWICE A DAY 180 tablet 0   diphenoxylate-atropine (LOMOTIL) 2.5-0.025 MG tablet Take 1 tablet by mouth 4 (four) times daily as needed for diarrhea or loose stools. 30 tablet 1   escitalopram (LEXAPRO) 10 MG tablet TAKE ONE TAB BY MOUTH TWICE A DAY FOR DEPRESSION 180 tablet 1   furosemide (LASIX) 20 MG tablet Take 1 tablet (20 mg total) by mouth daily. Take once a day as needed edema 30 tablet 2   gabapentin (NEURONTIN) 600 MG tablet Take one AM, one after lunch, and 2 QHS (Patient taking differently: Take 600-1,200 mg by mouth 3 (three) times daily. 600MG-AM 600MG-NOON 1,200MG-PM) 360 tablet 3   loratadine (CLARITIN) 10 MG tablet Take 10 mg by mouth daily as needed for allergies.     Methylcellulose, Laxative, (CITRUCEL PO) Take by mouth.     metoprolol succinate (TOPROL-XL) 50 MG 24 hr tablet Take 1.5 tablets (75 mg total) by mouth at bedtime. Take with or immediately following a meal. 135 tablet 1   mirtazapine (REMERON) 15 MG tablet Take 1 tablet (15 mg total) by mouth at bedtime. For sleep 90 tablet 1   montelukast (SINGULAIR) 10 MG tablet TAKE 1 TABLET BY MOUTH EVERYDAY AT BEDTIME 90 tablet 1   Multiple Vitamins-Minerals (BARIATRIC MULTIVITAMINS/IRON PO) Take by mouth.     ondansetron (ZOFRAN-ODT) 4 MG disintegrating tablet Take 1 to 2 tablets po TID prn  nausea 45 tablet 1   pantoprazole (PROTONIX) 40 MG tablet TAKE 1 TABLET BY MOUTH DAILY 90 tablet 1   polyethylene glycol-electrolytes (GAVILYTE-N WITH FLAVOR PACK) 420 g solution Drink one 8 oz glass every 20 mins until entire container is finished starting at 5:00pm on 08/29/20 4000 mL 0   Probiotic Product (PROBIOTIC PO) Take by mouth.     rOPINIRole (REQUIP) 0.5 MG tablet TAKE 1 TABLET BY MOUTH AT BEDTIME, MAY INCREASE TO 2 TABS AT BEDTIME AS NEEDED/ TOLERATED  180 tablet 0   rosuvastatin (CRESTOR) 10 MG tablet TAKE 1 TABLET BY MOUTH EVERY DAY 90 tablet 0   Sod Picosulfate-Mag Ox-Cit Acd (CLENPIQ) 10-3.5-12 MG-GM -GM/160ML SOLN Take 320 mLs by mouth as directed. 320 mL 0   tiZANidine (ZANAFLEX) 4 MG tablet Take 1 tablet po BID prn muscle pain/spasms. (Patient taking differently: Take 4 mg by mouth every 6 (six) hours as needed.) 45 tablet 3   benzonatate (TESSALON) 200 MG capsule Take 1 capsule (200 mg total) by mouth 2 (two) times daily as needed for cough. 30 capsule 0   HYDROcodone-acetaminophen (NORCO) 10-325 MG tablet TAKE 1 TABLET BY MOUTH AT BEDTIME AS NEEDED ACUTE/SEVERE PAIN 30 tablet 0   Semaglutide,0.25 or 0.5MG/DOS, (OZEMPIC, 0.25 OR 0.5 MG/DOSE,) 2 MG/1.5ML SOPN Inject 0.5 mg into the skin once a week. 1.5 mL 3   methylPREDNISolone (MEDROL) 4 MG TBPK tablet Take by mouth as directed for 6 days (Patient not taking: Reported on 10/11/2020) 21 tablet 0   No facility-administered medications prior to visit.    Allergies  Allergen Reactions   Flagyl [Metronidazole] Anaphylaxis and Rash   Other    Penicillins Anaphylaxis and Other (See Comments)    Has patient had a PCN reaction causing immediate rash, facial/tongue/throat swelling, SOB or lightheadedness with hypotension: Yes Has patient had a PCN reaction causing severe rash involving mucus membranes or skin necrosis: No Has patient had a PCN reaction that required hospitalization No Has patient had a PCN reaction occurring within the  last 10 years: No If all of the above answers are "NO", then may proceed with Cephalosporin use.   Doxycycline Hives    ROS Review of Systems  Constitutional:  Positive for activity change, appetite change and fatigue. Negative for chills and fever.  HENT:  Positive for congestion, postnasal drip and rhinorrhea. Negative for sinus pain.   Eyes: Negative.   Respiratory:  Positive for cough, shortness of breath and wheezing. Negative for chest tightness.   Cardiovascular:  Negative for chest pain and palpitations.  Gastrointestinal:  Positive for diarrhea. Negative for constipation, nausea and vomiting.       The patient is seeing GI for chronic problems with diarrhea.   Endocrine: Negative for cold intolerance, polydipsia and polyuria.       States that her blood sgars have been running a bit elevated since she recently had COVID 19.   Genitourinary: Negative.   Musculoskeletal:  Negative for back pain and myalgias.  Skin:  Negative for rash.  Allergic/Immunologic: Negative.   Neurological:  Positive for numbness. Negative for dizziness, weakness and headaches.       Severe neuropathy in both feet which has been unchanged over past several months.  Hematological: Negative.   Psychiatric/Behavioral:  Positive for sleep disturbance. The patient is not nervous/anxious.      Objective:    Physical Exam Vitals and nursing note reviewed.  Constitutional:      Appearance: Normal appearance. She is well-developed. She is obese.  HENT:     Head: Normocephalic and atraumatic.     Right Ear: Ear canal and external ear normal.     Left Ear: Ear canal and external ear normal.     Nose: Nose normal.     Mouth/Throat:     Mouth: Mucous membranes are moist.     Pharynx: Oropharynx is clear.  Eyes:     Extraocular Movements: Extraocular movements intact.     Conjunctiva/sclera: Conjunctivae normal.     Pupils: Pupils  are equal, round, and reactive to light.  Cardiovascular:     Rate and  Rhythm: Normal rate and regular rhythm.     Pulses: Normal pulses.     Heart sounds: Normal heart sounds.  Pulmonary:     Effort: Pulmonary effort is normal.     Breath sounds: Normal breath sounds.  Abdominal:     Palpations: Abdomen is soft.  Musculoskeletal:        General: Normal range of motion.     Cervical back: Normal range of motion and neck supple.  Skin:    General: Skin is warm and dry.     Capillary Refill: Capillary refill takes less than 2 seconds.  Neurological:     General: No focal deficit present.     Mental Status: She is alert and oriented to person, place, and time.  Psychiatric:        Mood and Affect: Mood normal.        Behavior: Behavior normal.        Thought Content: Thought content normal.        Judgment: Judgment normal.    Today's Vitals   10/11/20 0822  BP: 106/66  Pulse: 95  Temp: 97.7 F (36.5 C)  SpO2: 98%  Weight: 216 lb 8 oz (98.2 kg)   Body mass index is 37.16 kg/m.  Wt Readings from Last 3 Encounters:  10/11/20 216 lb 8 oz (98.2 kg)  10/01/20 214 lb (97.1 kg)  08/30/20 217 lb (98.4 kg)     Health Maintenance Due  Topic Date Due   HIV Screening  Never done   TETANUS/TDAP  Never done   Zoster Vaccines- Shingrix (1 of 2) Never done   MAMMOGRAM  07/19/2020   OPHTHALMOLOGY EXAM  08/08/2020   DEXA SCAN  Never done   PNA vac Low Risk Adult (1 of 2 - PCV13) Never done    There are no preventive care reminders to display for this patient.  Lab Results  Component Value Date   TSH 2.080 08/07/2020   Lab Results  Component Value Date   WBC 6.3 10/01/2020   HGB 13.6 10/01/2020   HCT 42.4 10/01/2020   MCV 97.0 10/01/2020   PLT 262 10/01/2020   Lab Results  Component Value Date   NA 138 10/01/2020   K 4.1 10/01/2020   CO2 25 10/01/2020   GLUCOSE 127 (H) 10/01/2020   BUN 10 10/01/2020   CREATININE 0.96 10/01/2020   BILITOT 0.6 10/01/2020   ALKPHOS 99 10/01/2020   AST 27 10/01/2020   ALT 15 10/01/2020   PROT 7.1  10/01/2020   ALBUMIN 3.9 10/01/2020   CALCIUM 9.0 10/01/2020   ANIONGAP 11 10/01/2020   EGFR 73 08/07/2020   Lab Results  Component Value Date   CHOL 174 08/07/2020   Lab Results  Component Value Date   HDL 57 08/07/2020   Lab Results  Component Value Date   LDLCALC 75 08/07/2020   Lab Results  Component Value Date   TRIG 259 (H) 08/07/2020   Lab Results  Component Value Date   CHOLHDL 3.1 08/07/2020   Lab Results  Component Value Date   HGBA1C 6.1 (H) 08/07/2020      Assessment & Plan:  1. Prediabetes Patient will elevated blood sugars since having COVID 19. Will increase Ozempic to 1 mg weekly. Patient should continue to monitor her blood sugars daily. Will check HgbA1c when time to do so.  - Semaglutide, 1 MG/DOSE, (OZEMPIC, 1  MG/DOSE,) 4 MG/3ML SOPN; Inject 1 mg into the skin once a week.  Dispense: 3 mL; Refill: 2  2. Peripheral neuropathic pain Increased peripheral neuropathy since dealing with COVID 19. Will increase dosing of hydrocodone/APAP 10/327m up to twice daily as needed for pain. A singe prescription for #45 tablets sent to her pharmacy today.  - HYDROcodone-acetaminophen (NORCO) 10-325 MG tablet; Take 1 tablet by mouth 2 (two) times daily as needed.  Dispense: 45 tablet; Refill: 0  3. Morbid obesity ([place] Improving with additions of ozempic 0.543mweekly. Will increase dose ozempic to 70m670meekly. She should limit calorie intake to 1500 calories pr less and gradually incorporate exercise into her daily routine.   4. COVID-19 Patient treated per appropriate antivirals upon diagnosis of COVID 19. Symptoms gradually improving.  Will continue to monitor.  5. Recurrent major depressive disorder, in partial remission ([place]. Continue currently medication as prescribed.     Problem List Items Addressed This Visit       Other   Peripheral neuropathic pain   Relevant Medications   HYDROcodone-acetaminophen (NORCO) 10-325 MG tablet   Morbid  obesity (HCC)   Relevant Medications   Semaglutide, 1 MG/DOSE, (OZEMPIC, 1 MG/DOSE,) 4 MG/3ML SOPN   Prediabetes - Primary   Relevant Medications   Semaglutide, 1 MG/DOSE, (OZEMPIC, 1 MG/DOSE,) 4 MG/3ML SOPN   Recurrent major depressive disorder, in partial remission ([place] COVID-19    Meds ordered this encounter  Medications   HYDROcodone-acetaminophen (NORCO) 10-325 MG tablet    Sig: Take 1 tablet by mouth 2 (two) times daily as needed.    Dispense:  45 tablet    Refill:  0    This is not new prescription. This is ongoing treatment for chronic pain. Increasing to #45 tablets for this month early. May fill early if needed. This time only.    Order Specific Question:   Supervising Provider    Answer:   METBeatrice Lecher[[id number]]   Semaglutide, 1 MG/DOSE, (OZEMPIC, 1 MG/DOSE,) 4 MG/3ML SOPN    Sig: Inject 1 mg into the skin once a week.    Dispense:  3 mL    Refill:  2    Please note increasing dose to 70mg38mekly    Order Specific Question:   Supervising Provider    Answer:   METHBeatrice [id number]]    Follow-up: Return in about 6 weeks (around 11/22/2020) for increased ozempic - weight management .    HeatRonnell Freshwater

## 2020-10-11 NOTE — Patient Instructions (Signed)
Prediabetes Eating Plan Prediabetes is a condition that causes blood sugar (glucose) levels to be higher than normal. This increases the risk for developing type 2 diabetes (type 2 diabetes mellitus). Working with a health care provider or nutrition specialist (dietitian) to make diet and lifestyle changes can help prevent the onset of diabetes. These changes may help you:  Control your blood glucose levels.  Improve your cholesterol levels.  Manage your blood pressure. What are tips for following this plan? Reading food labels  Read food labels to check the amount of fat, salt (sodium), and sugar in prepackaged foods. Avoid foods that have: ? Saturated fats. ? Trans fats. ? Added sugars.  Avoid foods that have more than 300 milligrams (mg) of sodium per serving. Limit your sodium intake to less than 2,300 mg each day. Shopping  Avoid buying pre-made and processed foods.  Avoid buying drinks with added sugar. Cooking  Cook with olive oil. Do not use butter, lard, or ghee.  Bake, broil, grill, steam, or boil foods. Avoid frying. Meal planning  Work with your dietitian to create an eating plan that is right for you. This may include tracking how many calories you take in each day. Use a food diary, notebook, or mobile application to track what you eat at each meal.  Consider following a Mediterranean diet. This includes: ? Eating several servings of fresh fruits and vegetables each day. ? Eating fish at least twice a week. ? Eating one serving each day of whole grains, beans, nuts, and seeds. ? Using olive oil instead of other fats. ? Limiting alcohol. ? Limiting red meat. ? Using nonfat or low-fat dairy products.  Consider following a plant-based diet. This includes dietary choices that focus on eating mostly vegetables and fruit, grains, beans, nuts, and seeds.  If you have high blood pressure, you may need to limit your sodium intake or follow a diet such as the DASH  (Dietary Approaches to Stop Hypertension) eating plan. The DASH diet aims to lower high blood pressure.   Lifestyle  Set weight loss goals with help from your health care team. It is recommended that most people with prediabetes lose 7% of their body weight.  Exercise for at least 30 minutes 5 or more days a week.  Attend a support group or seek support from a mental health counselor.  Take over-the-counter and prescription medicines only as told by your health care provider. What foods are recommended? Fruits Berries. Bananas. Apples. Oranges. Grapes. Papaya. Mango. Pomegranate. Kiwi. Grapefruit. Cherries. Vegetables Lettuce. Spinach. Peas. Beets. Cauliflower. Cabbage. Broccoli. Carrots. Tomatoes. Squash. Eggplant. Herbs. Peppers. Onions. Cucumbers. Brussels sprouts. Grains Whole grains, such as whole-wheat or whole-grain breads, crackers, cereals, and pasta. Unsweetened oatmeal. Bulgur. Barley. Quinoa. Brown rice. Corn or whole-wheat flour tortillas or taco shells. Meats and other proteins Seafood. Poultry without skin. Lean cuts of pork and beef. Tofu. Eggs. Nuts. Beans. Dairy Low-fat or fat-free dairy products, such as yogurt, cottage cheese, and cheese. Beverages Water. Tea. Coffee. Sugar-free or diet soda. Seltzer water. Low-fat or nonfat milk. Milk alternatives, such as soy or almond milk. Fats and oils Olive oil. Canola oil. Sunflower oil. Grapeseed oil. Avocado. Walnuts. Sweets and desserts Sugar-free or low-fat pudding. Sugar-free or low-fat ice cream and other frozen treats. Seasonings and condiments Herbs. Sodium-free spices. Mustard. Relish. Low-salt, low-sugar ketchup. Low-salt, low-sugar barbecue sauce. Low-fat or fat-free mayonnaise. The items listed above may not be a complete list of recommended foods and beverages. Contact a dietitian for more   information. What foods are not recommended? Fruits Fruits canned with syrup. Vegetables Canned vegetables. Frozen  vegetables with butter or cream sauce. Grains Refined white flour and flour products, such as bread, pasta, snack foods, and cereals. Meats and other proteins Fatty cuts of meat. Poultry with skin. Breaded or fried meat. Processed meats. Dairy Full-fat yogurt, cheese, or milk. Beverages Sweetened drinks, such as iced tea and soda. Fats and oils Butter. Lard. Ghee. Sweets and desserts Baked goods, such as cake, cupcakes, pastries, cookies, and cheesecake. Seasonings and condiments Spice mixes with added salt. Ketchup. Barbecue sauce. Mayonnaise. The items listed above may not be a complete list of foods and beverages that are not recommended. Contact a dietitian for more information. Where to find more information  American Diabetes Association: www.diabetes.org Summary  You may need to make diet and lifestyle changes to help prevent the onset of diabetes. These changes can help you control blood sugar, improve cholesterol levels, and manage blood pressure.  Set weight loss goals with help from your health care team. It is recommended that most people with prediabetes lose 7% of their body weight.  Consider following a Mediterranean diet. This includes eating plenty of fresh fruits and vegetables, whole grains, beans, nuts, seeds, fish, and low-fat dairy, and using olive oil instead of other fats. This information is not intended to replace advice given to you by your health care provider. Make sure you discuss any questions you have with your health care provider. Document Revised: 07/27/2019 Document Reviewed: 07/27/2019 Elsevier Patient Education  2021 Elsevier Inc.  

## 2020-10-17 ENCOUNTER — Ambulatory Visit: Payer: Self-pay | Admitting: Urology

## 2020-10-18 ENCOUNTER — Encounter: Payer: Self-pay | Admitting: Urology

## 2020-10-18 DIAGNOSIS — H1045 Other chronic allergic conjunctivitis: Secondary | ICD-10-CM | POA: Diagnosis not present

## 2020-10-18 DIAGNOSIS — H04223 Epiphora due to insufficient drainage, bilateral lacrimal glands: Secondary | ICD-10-CM | POA: Diagnosis not present

## 2020-10-18 DIAGNOSIS — H0288A Meibomian gland dysfunction right eye, upper and lower eyelids: Secondary | ICD-10-CM | POA: Diagnosis not present

## 2020-10-18 DIAGNOSIS — H35371 Puckering of macula, right eye: Secondary | ICD-10-CM | POA: Diagnosis not present

## 2020-10-20 DIAGNOSIS — U071 COVID-19: Secondary | ICD-10-CM | POA: Insufficient documentation

## 2020-11-05 ENCOUNTER — Ambulatory Visit (INDEPENDENT_AMBULATORY_CARE_PROVIDER_SITE_OTHER): Payer: BC Managed Care – PPO | Admitting: Nurse Practitioner

## 2020-11-05 ENCOUNTER — Encounter: Payer: Self-pay | Admitting: Nurse Practitioner

## 2020-11-05 ENCOUNTER — Telehealth: Payer: Self-pay | Admitting: Nurse Practitioner

## 2020-11-05 VITALS — Wt 216.0 lb

## 2020-11-05 DIAGNOSIS — R062 Wheezing: Secondary | ICD-10-CM | POA: Diagnosis not present

## 2020-11-05 DIAGNOSIS — R059 Cough, unspecified: Secondary | ICD-10-CM | POA: Diagnosis not present

## 2020-11-05 DIAGNOSIS — J0141 Acute recurrent pansinusitis: Secondary | ICD-10-CM | POA: Insufficient documentation

## 2020-11-05 DIAGNOSIS — J0140 Acute pansinusitis, unspecified: Secondary | ICD-10-CM

## 2020-11-05 MED ORDER — HYDROCOD POLST-CPM POLST ER 10-8 MG/5ML PO SUER: 5.0000 mL | Freq: Two times a day (BID) | ORAL | 0 refills | Status: DC | PRN

## 2020-11-05 MED ORDER — ALBUTEROL SULFATE HFA 108 (90 BASE) MCG/ACT IN AERS: 2.0000 | INHALATION_SPRAY | Freq: Four times a day (QID) | RESPIRATORY_TRACT | 2 refills | Status: DC | PRN

## 2020-11-05 MED ORDER — CLARITHROMYCIN 500 MG PO TABS: 500.0000 mg | ORAL_TABLET | Freq: Two times a day (BID) | ORAL | 0 refills | Status: DC

## 2020-11-05 NOTE — Progress Notes (Signed)
Virtual Visit via Telephone Note  I connected with Wendy Hubbard on 11/05/20 at 10:10 AM EDT by telephone and verified that I am speaking with the correct person using two identifiers.  Location: Patient: home Provider: [place] [facility] at [facility]     I discussed the limitations, risks, security and privacy concerns of performing an evaluation and management service by telephone and the availability of in person appointments. I also discussed with the patient that there may be a patient responsible charge related to this service. The patient expressed understanding and agreed to proceed.   History of Present Illness: The patient states that she has had headache for past few days. She has developed a cough. Cough is much worse at night. It is now productive. She is coughing up yellow/green mucus at times. Cough keeping her awake at night. She denies fever, chills, or body aches. She has taken home COVID test which was negative. She has tried tessalon perls which were ineffective. She states that symptoms have been getting worse and she is getting ready to go out of town in two days. She is afraid this will turn into severe infection if she doesn't treat this now. She did have COVID 19 less than a month ago.   Observations/Objective:  Today's Vitals   11/05/20 1002  Weight: 216 lb (98 kg)   Body mass index is 37.08 kg/m.  The patient is alert and oriented. She is pleasant and answers all questions appropriately. Breathing is non-labored. She is in no acute distress at this time.  She sounds nasally congested. She has harsh, loose sounding cough during today's telephone visit.   Assessment and Plan: 1. Acute non-recurrent pansinusitis Start biaxin 500mg  twice daily for 7 days. Rest and increase fluids. Continue using OTC medication to control symptoms.  Recommend she take probiotic daily while taking antibiotic to keep up population of good bacteria in the gut.  - clarithromycin  (BIAXIN) 500 MG tablet; Take 1 tablet (500 mg total) by mouth 2 (two) times daily.  Dispense: 14 tablet; Refill: 0  2. Wheezing Add albuterol rescue inhaler. Use two inhalations up to four times daily as needed for wheezing and cough.  - albuterol (VENTOLIN HFA) 108 (90 Base) MCG/ACT inhaler; Inhale 2 puffs into the lungs every 6 (six) hours as needed for wheezing or shortness of breath.  Dispense: 1 each; Refill: 2  3. Cough May use tussionex up to twice daily as needed for cough. Advised patient not to overuse this medicine and not to mix with other medications or alcohol as it can cause respiratory distress, sleepiness or dizziness. Should also avoid driving. Patient voiced understanding and agreement.   - chlorpheniramine-HYDROcodone (TUSSIONEX PENNKINETIC ER) 10-8 MG/5ML SUER; Take 5 mLs by mouth every 12 (twelve) hours as needed for cough.  Dispense: 115 mL; Refill: 0 - albuterol (VENTOLIN HFA) 108 (90 Base) MCG/ACT inhaler; Inhale 2 puffs into the lungs every 6 (six) hours as needed for wheezing or shortness of breath.  Dispense: 1 each; Refill: 2   Follow Up Instructions:    I discussed the assessment and treatment plan with the patient. The patient was provided an opportunity to ask questions and all were answered. The patient agreed with the plan and demonstrated an understanding of the instructions.   The patient was advised to call back or seek an in-person evaluation if the symptoms worsen or if the condition fails to improve as anticipated.  I provided 10 minutes of non-face-to-face time  during this encounter.   Ronnell Freshwater, NP

## 2020-11-05 NOTE — Telephone Encounter (Signed)
Patient's COVID test was negative.

## 2020-11-05 NOTE — Telephone Encounter (Signed)
Patient has a really bad cough and congestion. Patient states mucus is a yellow and green color. Patient has not taken a COVID test and was advised to. Please advise, thanks.

## 2020-11-07 ENCOUNTER — Other Ambulatory Visit: Payer: Self-pay | Admitting: Nurse Practitioner

## 2020-11-07 DIAGNOSIS — R600 Localized edema: Secondary | ICD-10-CM

## 2020-11-16 ENCOUNTER — Other Ambulatory Visit: Payer: Self-pay | Admitting: Nurse Practitioner

## 2020-11-16 DIAGNOSIS — J301 Allergic rhinitis due to pollen: Secondary | ICD-10-CM

## 2020-11-20 ENCOUNTER — Other Ambulatory Visit: Payer: Self-pay | Admitting: Nurse Practitioner

## 2020-11-20 DIAGNOSIS — F3341 Major depressive disorder, recurrent, in partial remission: Secondary | ICD-10-CM

## 2020-11-21 ENCOUNTER — Telehealth: Payer: Self-pay | Admitting: Nurse Practitioner

## 2020-11-21 ENCOUNTER — Encounter: Payer: Self-pay | Admitting: Nurse Practitioner

## 2020-11-21 ENCOUNTER — Other Ambulatory Visit: Payer: Self-pay

## 2020-11-21 ENCOUNTER — Ambulatory Visit (INDEPENDENT_AMBULATORY_CARE_PROVIDER_SITE_OTHER): Payer: BC Managed Care – PPO | Admitting: Nurse Practitioner

## 2020-11-21 VITALS — BP 112/68 | HR 83 | Temp 96.7°F | Ht 64.0 in | Wt 216.6 lb

## 2020-11-21 DIAGNOSIS — R7303 Prediabetes: Secondary | ICD-10-CM

## 2020-11-21 DIAGNOSIS — G2581 Restless legs syndrome: Secondary | ICD-10-CM | POA: Diagnosis not present

## 2020-11-21 DIAGNOSIS — I10 Essential (primary) hypertension: Secondary | ICD-10-CM

## 2020-11-21 DIAGNOSIS — Z6839 Body mass index (BMI) 39.0-39.9, adult: Secondary | ICD-10-CM | POA: Insufficient documentation

## 2020-11-21 DIAGNOSIS — Z6838 Body mass index (BMI) 38.0-38.9, adult: Secondary | ICD-10-CM | POA: Insufficient documentation

## 2020-11-21 DIAGNOSIS — Z6837 Body mass index (BMI) 37.0-37.9, adult: Secondary | ICD-10-CM | POA: Insufficient documentation

## 2020-11-21 DIAGNOSIS — M792 Neuralgia and neuritis, unspecified: Secondary | ICD-10-CM | POA: Diagnosis not present

## 2020-11-21 DIAGNOSIS — Z6835 Body mass index (BMI) 35.0-35.9, adult: Secondary | ICD-10-CM | POA: Insufficient documentation

## 2020-11-21 MED ORDER — SAXENDA 18 MG/3ML ~~LOC~~ SOPN: PEN_INJECTOR | SUBCUTANEOUS | 2 refills | Status: DC

## 2020-11-21 MED ORDER — "PEN NEEDLES 3/16"" 31G X 5 MM MISC": 3 refills | Status: DC

## 2020-11-21 MED ORDER — HYDROCODONE-ACETAMINOPHEN 10-325 MG PO TABS
1.0000 | ORAL_TABLET | Freq: Two times a day (BID) | ORAL | 0 refills | Status: DC | PRN
Start: 2020-11-21 — End: 2021-01-09

## 2020-11-21 MED ORDER — HYDROCODONE-ACETAMINOPHEN 10-325 MG PO TABS: 1.0000 | ORAL_TABLET | Freq: Two times a day (BID) | ORAL | 0 refills | Status: DC | PRN

## 2020-11-21 NOTE — Telephone Encounter (Signed)
[place] spoke to the pharm tech Lasix was refill

## 2020-11-21 NOTE — Telephone Encounter (Signed)
Wendy Hubbard w/[facility] needs the [place] dosage instructions.  Please call her at [phone].  Prescription states Inject up to mg [place] qd as tolerated. See below....  Liraglutide -Weight Management (SAXENDA) 18 MG/3ML SOPN [[id number]]    Order Details Dose, Route, Frequency: As Directed  Dispense Quantity: 15 mL Refills: 2   Note to Pharmacy: Please fill as 90 day prescription. Patient will have manufacturer coupon.  Indications of Use: Obesity       Sig: Inject up to mg [place] qd as tolerated       Start Date: 11/21/20 End Date: --  Written Date: 11/21/20 Expiration Date: 11/21/21     Diagnosis Association: Body mass index (BMI) of 37.0-37.9 in adult (T62.56); Prediabetes (R73.03)

## 2020-11-21 NOTE — Telephone Encounter (Signed)
Thanks

## 2020-11-21 NOTE — Telephone Encounter (Signed)
Patient's pharmacy called and asked a refill on Lasix and uses [facility] in [place], thanks.

## 2020-11-21 NOTE — Patient Instructions (Signed)
Calorie Counting for Weight Loss Calories are units of energy. Your body needs a certain number of calories from food to keep going throughout the day. When you eat or drink more calories than your body needs, your body stores the extra calories mostly as fat. When you eat or drink fewer calories than your body needs, your body burns fat to getthe energy it needs. Calorie counting means keeping track of how many calories you eat and drink each day. Calorie counting can be helpful if you need to lose weight. If you eat fewer calories than your body needs, you should lose weight. Ask yourhealth care provider what a healthy weight is for you. For calorie counting to work, you will need to eat the right number of calories each day to lose a healthy amount of weight per week. A dietitian can help you figure out how many calories you need in a day and will suggest ways to reach your calorie goal. A healthy amount of weight to lose each week is usually 1-2 lb (0.5-0.9 kg). This usually means that your daily calorie intake should be reduced by 500-750 calories. Eating 1,200-1,500 calories a day can help most women lose weight. Eating 1,500-1,800 calories a day can help most men lose weight. What do I need to know about calorie counting? Work with your health care provider or dietitian to determine how many calories you should get each day. To meet your daily calorie goal, you will need to: Find out how many calories are in each food that you would like to eat. Try to do this before you eat. Decide how much of the food you plan to eat. Keep a food log. Do this by writing down what you ate and how many calories it had. To successfully lose weight, it is important to balance calorie counting with ahealthy lifestyle that includes regular activity. Where do I find calorie information?  The number of calories in a food can be found on a Nutrition Facts label. If a food does not have a Nutrition Facts label, try to  look up the calories onlineor ask your dietitian for help. Remember that calories are listed per serving. If you choose to have more than one serving of a food, you will have to multiply the calories per serving by the number of servings you plan to eat. For example, the label on a package of bread might say that a serving size is 1 slice and that there are 90 calories in a serving. If you eat 1 slice, you will have eaten 90 calories. If you eat 2slices, you will have eaten 180 calories. How do I keep a food log? After each time that you eat, record the following in your food log as soon as possible: What you ate. Be sure to include toppings, sauces, and other extras on the food. How much you ate. This can be measured in cups, ounces, or number of items. How many calories were in each food and drink. The total number of calories in the food you ate. Keep your food log near you, such as in a pocket-sized notebook or on an app or website on your mobile phone. Some programs will calculate calories for you andshow you how many calories you have left to meet your daily goal. What are some portion-control tips? Know how many calories are in a serving. This will help you know how many servings you can have of a certain food. Use a measuring cup to  measure serving sizes. You could also try weighing out portions on a kitchen scale. With time, you will be able to estimate serving sizes for some foods. Take time to put servings of different foods on your favorite plates or in your favorite bowls and cups so you know what a serving looks like. Try not to eat straight from a food's packaging, such as from a bag or box. Eating straight from the package makes it hard to see how much you are eating and can lead to overeating. Put the amount you would like to eat in a cup or on a plate to make sure you are eating the right portion. Use smaller plates, glasses, and bowls for smaller portions and to prevent  overeating. Try not to multitask. For example, avoid watching TV or using your computer while eating. If it is time to eat, sit down at a table and enjoy your food. This will help you recognize when you are full. It will also help you be more mindful of what and how much you are eating. What are tips for following this plan? Reading food labels Check the calorie count compared with the serving size. The serving size may be smaller than what you are used to eating. Check the source of the calories. Try to choose foods that are high in protein, fiber, and vitamins, and low in saturated fat, trans fat, and sodium. Shopping Read nutrition labels while you shop. This will help you make healthy decisions about which foods to buy. Pay attention to nutrition labels for low-fat or fat-free foods. These foods sometimes have the same number of calories or more calories than the full-fat versions. They also often have added sugar, starch, or salt to make up for flavor that was removed with the fat. Make a grocery list of lower-calorie foods and stick to it. Cooking Try to cook your favorite foods in a healthier way. For example, try baking instead of frying. Use low-fat dairy products. Meal planning Use more fruits and vegetables. One-half of your plate should be fruits and vegetables. Include lean proteins, such as chicken, [place], and fish. Lifestyle Each week, aim to do one of the following: 150 minutes of moderate exercise, such as walking. 75 minutes of vigorous exercise, such as running. General information Know how many calories are in the foods you eat most often. This will help you calculate calorie counts faster. Find a way of tracking calories that works for you. Get creative. Try different apps or programs if writing down calories does not work for you. What foods should I eat?  Eat nutritious foods. It is better to have a nutritious, high-calorie food, such as an avocado, than a food with  few nutrients, such as a bag of potato chips. Use your calories on foods and drinks that will fill you up and will not leave you hungry soon after eating. Examples of foods that fill you up are nuts and nut butters, vegetables, lean proteins, and high-fiber foods such as whole grains. High-fiber foods are foods with more than 5 g of fiber per serving. Pay attention to calories in drinks. Low-calorie drinks include water and unsweetened drinks. The items listed above may not be a complete list of foods and beverages you can eat. Contact a dietitian for more information. What foods should I limit? Limit foods or drinks that are not good sources of vitamins, minerals, or protein or that are high in unhealthy fats. These include: Candy. Other sweets. Sodas, specialty  coffee drinks, alcohol, and juice. The items listed above may not be a complete list of foods and beverages you should avoid. Contact a dietitian for more information. How do I count calories when eating out? Pay attention to portions. Often, portions are much larger when eating out. Try these tips to keep portions smaller: Consider sharing a meal instead of getting your own. If you get your own meal, eat only half of it. Before you start eating, ask for a container and put half of your meal into it. When available, consider ordering smaller portions from the menu instead of full portions. Pay attention to your food and drink choices. Knowing the way food is cooked and what is included with the meal can help you eat fewer calories. If calories are listed on the menu, choose the lower-calorie options. Choose dishes that include vegetables, fruits, whole grains, low-fat dairy products, and lean proteins. Choose items that are boiled, broiled, grilled, or steamed. Avoid items that are buttered, battered, fried, or served with cream sauce. Items labeled as crispy are usually fried, unless stated otherwise. Choose water, low-fat milk,  unsweetened iced tea, or other drinks without added sugar. If you want an alcoholic beverage, choose a lower-calorie option, such as a glass of wine or light beer. Ask for dressings, sauces, and syrups on the side. These are usually high in calories, so you should limit the amount you eat. If you want a salad, choose a garden salad and ask for grilled meats. Avoid extra toppings such as bacon, cheese, or fried items. Ask for the dressing on the side, or ask for olive oil and vinegar or lemon to use as dressing. Estimate how many servings of a food you are given. Knowing serving sizes will help you be aware of how much food you are eating at restaurants. Where to find more information Centers for Disease Control and Prevention: http://www.wolf.info/ U.[place]. Department of Agriculture: http://www.wilson-mendoza.org/ Summary Calorie counting means keeping track of how many calories you eat and drink each day. If you eat fewer calories than your body needs, you should lose weight. A healthy amount of weight to lose per week is usually 1-2 lb (0.5-0.9 kg). This usually means reducing your daily calorie intake by 500-750 calories. The number of calories in a food can be found on a Nutrition Facts label. If a food does not have a Nutrition Facts label, try to look up the calories online or ask your dietitian for help. Use smaller plates, glasses, and bowls for smaller portions and to prevent overeating. Use your calories on foods and drinks that will fill you up and not leave you hungry shortly after a meal. This information is not intended to replace advice given to you by your health care provider. Make sure you discuss any questions you have with your healthcare provider. Document Revised: 06/08/2019 Document Reviewed: 06/08/2019 Elsevier Patient Education  2022 [place] and Cholesterol Restricted Eating Plan Getting too much fat and cholesterol in your diet may cause health problems. Choosing the right foods helps keep  your fat and cholesterol at normal levels.This can keep you from getting certain diseases. Your doctor may recommend an eating plan that includes: Total fat: ______% or less of total calories a day. Saturated fat: ______% or less of total calories a day. Cholesterol: less than _________mg a day. Fiber: ______g a day. What are tips for following this plan? Meal planning At meals, divide your plate into four equal parts: Fill one-half of  your plate with vegetables and green salads. Fill one-fourth of your plate with whole grains. Fill one-fourth of your plate with low-fat (lean) protein foods. Eat fish that is high in omega-3 fats at least two times a week. This includes mackerel, tuna, sardines, and salmon. Eat foods that are high in fiber, such as whole grains, beans, apples, broccoli, carrots, peas, and barley. General tips  Work with your doctor to lose weight if you need to. Avoid: Foods with added sugar. Fried foods. Foods with partially hydrogenated oils. Limit alcohol intake to no more than 1 drink a day for nonpregnant women and 2 drinks a day for men. One drink equals 12 oz of beer, 5 oz of wine, or 1 oz of hard liquor.  Reading food labels Check food labels for: Trans fats. Partially hydrogenated oils. Saturated fat (g) in each serving. Cholesterol (mg) in each serving. Fiber (g) in each serving. Choose foods with healthy fats, such as: Monounsaturated fats. Polyunsaturated fats. Omega-3 fats. Choose grain products that have whole grains. Look for the word "whole" as the first word in the ingredient list. Cooking Cook foods using low-fat methods. These include baking, boiling, grilling, and broiling. Eat more home-cooked foods. Eat at restaurants and buffets less often. Avoid cooking using saturated fats, such as butter, cream, palm oil, palm kernel oil, and coconut oil. Recommended foods  Fruits All fresh, canned (in natural juice), or frozen  fruits. Vegetables Fresh or frozen vegetables (raw, steamed, roasted, or grilled). Green salads. Grains Whole grains, such as whole wheat or whole grain breads, crackers, cereals, and pasta. Unsweetened oatmeal, bulgur, barley, quinoa, or brown rice. Corn or whole wheat flour tortillas. Meats and other protein foods Ground beef (85% or leaner), grass-fed beef, or beef trimmed of fat. Skinless chicken or [place]. Ground chicken or [place]. Pork trimmed of fat. All fish and seafood. Egg whites. Dried beans, peas, or lentils. Unsalted nuts or seeds. Unsalted canned beans. Nut butters without added sugar or oil. Dairy Low-fat or nonfat dairy products, such as skim or 1% milk, 2% or reduced-fat cheeses, low-fat and fat-free ricotta or cottage cheese, or plain low-fat and nonfat yogurt. Fats and oils Tub margarine without trans fats. Light or reduced-fat mayonnaise and salad dressings. Avocado. Olive, canola, sesame, or safflower oils. The items listed above may not be a complete list of foods and beverages youcan eat. Contact a dietitian for more information. Foods to avoid Fruits Canned fruit in heavy syrup. Fruit in cream or butter sauce. Fried fruit. Vegetables Vegetables cooked in cheese, cream, or butter sauce. Fried vegetables. Grains White bread. White pasta. White rice. Cornbread. Bagels, pastries, and croissants. Crackers and snack foods that contain trans fat and hydrogenated oils. Meats and other protein foods Fatty cuts of meat. Ribs, chicken wings, bacon, sausage, bologna, salami, chitterlings, fatback, hot dogs, bratwurst, and packaged lunch meats. Liver and organ meats. Whole eggs and egg yolks. Chicken and [place] with skin. Fried meat. Dairy Whole or 2% milk, cream, half-and-half, and cream cheese. Whole milk cheeses. Whole-fat or sweetened yogurt. Full-fat cheeses. Nondairy creamers and whipped toppings. Processed cheese, cheese spreads, and cheese curds. Beverages Alcohol.  Sugar-sweetened drinks such as sodas, lemonade, and fruit drinks. Fats and oils Butter, stick margarine, lard, shortening, ghee, or bacon fat. Coconut, palm kernel, and palm oils. Sweets and desserts Corn syrup, sugars, honey, and molasses. Candy. Jam and jelly. Syrup. Sweetened cereals. Cookies, pies, cakes, donuts, muffins, and ice cream. The items listed above may not be a complete list  of foods and beverages youshould avoid. Contact a dietitian for more information. Summary Choosing the right foods helps keep your fat and cholesterol at normal levels. This can keep you from getting certain diseases. At meals, fill one-half of your plate with vegetables and green salads. Eat high-fiber foods, like whole grains, beans, apples, carrots, peas, and barley. Limit added sugar, saturated fats, alcohol, and fried foods. This information is not intended to replace advice given to you by your health care provider. Make sure you discuss any questions you have with your healthcare provider. Document Revised: 08/30/2019 Document Reviewed: 08/30/2019 Elsevier Patient Education  2022 [employer].

## 2020-11-21 NOTE — Telephone Encounter (Signed)
That was my fault. I think my number lock was off. I added the specific dosing and sent rx back to [facility].

## 2020-11-21 NOTE — Progress Notes (Signed)
Established Patient Office Visit  Subjective:  Patient ID: Wendy Hubbard, female    DOB: 07-16-1955  Age: 65 y.o. MRN: [id number]  CC:  Chief Complaint  Patient presents with   Follow-up    HPI Wendy Hubbard [place] presents for follow-up visit.  Was started on Ozempic.  Dose increased to 1 mg weekly.  Prescribed to help with blood sugar control and weight management.  Patient has not lost weight since dose increased.  Lack of weight loss is causing her to feel a little bit down.  She does feel like this may change.  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  She will be getting out of the house every day from work rather than working from home.  This will help her interact with people, increase her physical activity, and feel better in general. She does need refill for her hydrocodone/APAP 10/391m tablets. She takes this medication only at night as the pain in her legs and feet is most severe at night.  Hurts to even have sheets touch her feet.  Requip helps some, but addition of hydrocodone helps calm her nerves in her lower legs and feet and helps her to rest.  Reviewed her PDMP profile today.  Her overdose risk score is 320.  Increase in score likely due to recent prescription for Tussionex cough syrup.  She has completed this and is no longer taking it.  Her fill history is appropriate. Patient has no new physical concerns or complaints today.denies chest pain, chest pressure, or shortness of breath. She denies headaches or visual disturbances. She denies abdominal pain, nausea, vomiting, or changes in bowel or bladder habits.    Past Medical History:  Diagnosis Date   Allergy    Phreesia 07/23/2020   Anxiety    Phreesia 07/23/2020   COPD (chronic obstructive pulmonary disease) (HCC)    DDD (degenerative disc disease), thoracic    Depression    Phreesia 07/23/2020   Diabetes mellitus without complication ([place]    Phreesia 07/23/2020   GERD (gastroesophageal reflux disease)     Hyperlipidemia    Hypertension    Rapid heart rate    Sleep apnea    Wears contact lenses     Past Surgical History:  Procedure Laterality Date   ABDOMINAL HYSTERECTOMY     ANKLE ARTHROSCOPY Bilateral    APPENDECTOMY     BLADDER SUSPENSION     BREAST EXCISIONAL BIOPSY Left 1990   neg   BREAST EXCISIONAL BIOPSY Right 1989   neg   BREAST SURGERY     CHOLECYSTECTOMY     COLONOSCOPY N/A 11/04/2015   Procedure: COLONOSCOPY;  Surgeon: RManya Silvas MD;  [place] [facility]  Service: Endoscopy;  Laterality: N/A;   COLONOSCOPY WITH PROPOFOL N/A 08/30/2020   Procedure: COLONOSCOPY WITH PROPOFOL;  Surgeon: WLucilla Lame MD;  [place] [facility]  Service: Endoscopy;  Laterality: N/A;   ESOPHAGOGASTRODUODENOSCOPY N/A 11/02/2015   Procedure: ESOPHAGOGASTRODUODENOSCOPY (EGD);  Surgeon: RManya Silvas MD;  [facility];  Service: Endoscopy;  Laterality: N/A;   KNEE ARTHROSCOPY Left    POLYPECTOMY  08/30/2020   Procedure: POLYPECTOMY;  Surgeon: WLucilla Lame MD;  [facility];  Service: Endoscopy;;   REDUCTION MAMMAPLASTY Bilateral 1988   TONSILLECTOMY      Family History  Problem Relation Age of Onset   Hypertension Other    Bladder Cancer Mother    High Cholesterol Mother    High blood pressure Mother  Cancer Mother    Heart attack Father    High blood pressure Father    Kidney cancer Neg Hx    Prostate cancer Neg Hx    Breast cancer Neg Hx     Social History   Socioeconomic History   Marital status: Married    Spouse name: Not on file   Number of children: Not on file   Years of education: Not on file   Highest education level: Not on file  Occupational History   Not on file  Tobacco Use   Smoking status: Former   Smokeless tobacco: Never   Tobacco comments:    quit in 1996  Vaping Use   Vaping Use: Never used  Substance and Sexual Activity   Alcohol use: Yes    Alcohol/week: 2.0 standard drinks    Types: 1  Glasses of wine, 1 Standard drinks or equivalent per week    Comment: occasional   Drug use: No   Sexual activity: Yes    Partners: Male  Other Topics Concern   Not on file  Social History Narrative   Not on file   Social Determinants of Health   Financial Resource Strain: Not on file  Food Insecurity: Not on file  Transportation Needs: Not on file  Physical Activity: Not on file  Stress: Not on file  Social Connections: Not on file  Intimate Partner Violence: Not on file    Outpatient Medications Prior to Visit  Medication Sig Dispense Refill   albuterol (VENTOLIN HFA) 108 (90 Base) MCG/ACT inhaler Inhale 2 puffs into the lungs every 6 (six) hours as needed for wheezing or shortness of breath. 1 each 2   alosetron (LOTRONEX) 1 MG tablet Take 1 tablet (1 mg total) by mouth 2 (two) times daily. 60 tablet 5   ALPRAZolam (XANAX) 0.5 MG tablet Take half to one tab a day prn only for acute anxiety or panic attack 30 tablet 2   butalbital-acetaminophen-caffeine (FIORICET) 50-325-40 MG tablet Take 1-2 tablets by mouth every 6 (six) hours as needed for headache. 30 tablet 2   cilostazol (PLETAL) 100 MG tablet TAKE 1 TABLET BY MOUTH TWICE A DAY 180 tablet 0   diphenoxylate-atropine (LOMOTIL) 2.5-0.025 MG tablet Take 1 tablet by mouth 4 (four) times daily as needed for diarrhea or loose stools. 30 tablet 1   escitalopram (LEXAPRO) 10 MG tablet TAKE ONE TAB BY MOUTH TWICE A DAY FOR DEPRESSION 180 tablet 1   furosemide (LASIX) 20 MG tablet TAKE 1 TABLET (20 MG TOTAL) BY MOUTH DAILY. TAKE ONCE A DAY AS NEEDED EDEMA 90 tablet 0   gabapentin (NEURONTIN) 600 MG tablet Take one AM, one after lunch, and 2 QHS (Patient taking differently: Take 600-1,200 mg by mouth 3 (three) times daily. 600MG-AM 600MG-NOON 1,200MG-PM) 360 tablet 3   loratadine (CLARITIN) 10 MG tablet Take 10 mg by mouth daily as needed for allergies.     metoprolol succinate (TOPROL-XL) 50 MG 24 hr tablet Take 1.5 tablets (75 mg  total) by mouth at bedtime. Take with or immediately following a meal. 135 tablet 1   mirtazapine (REMERON) 15 MG tablet Take 1 tablet (15 mg total) by mouth at bedtime. For sleep 90 tablet 1   montelukast (SINGULAIR) 10 MG tablet TAKE 1 TABLET BY MOUTH AT BEDTIME 90 tablet 0   Multiple Vitamins-Minerals (BARIATRIC MULTIVITAMINS/IRON PO) Take by mouth.     ondansetron (ZOFRAN-ODT) 4 MG disintegrating tablet Take 1 to 2 tablets po TID prn nausea  45 tablet 1   pantoprazole (PROTONIX) 40 MG tablet TAKE 1 TABLET BY MOUTH DAILY 90 tablet 1   polyethylene glycol-electrolytes (GAVILYTE-N WITH FLAVOR PACK) 420 g solution Drink one 8 oz glass every 20 mins until entire container is finished starting at 5:00pm on 08/29/20 4000 mL 0   Probiotic Product (PROBIOTIC PO) Take by mouth.     rOPINIRole (REQUIP) 0.5 MG tablet TAKE 1 TABLET BY MOUTH AT BEDTIME, MAY INCREASE TO 2 TABS AT BEDTIME AS NEEDED/ TOLERATED 180 tablet 0   rosuvastatin (CRESTOR) 10 MG tablet TAKE 1 TABLET BY MOUTH EVERY DAY 90 tablet 0   Sod Picosulfate-Mag Ox-Cit Acd (CLENPIQ) 10-3.5-12 MG-GM -GM/160ML SOLN Take 320 mLs by mouth as directed. 320 mL 0   tiZANidine (ZANAFLEX) 4 MG tablet Take 1 tablet po BID prn muscle pain/spasms. (Patient taking differently: Take 4 mg by mouth every 6 (six) hours as needed.) 45 tablet 3   chlorpheniramine-HYDROcodone (TUSSIONEX PENNKINETIC ER) 10-8 MG/5ML SUER Take 5 mLs by mouth every 12 (twelve) hours as needed for cough. 115 mL 0   clarithromycin (BIAXIN) 500 MG tablet Take 1 tablet (500 mg total) by mouth 2 (two) times daily. 14 tablet 0   HYDROcodone-acetaminophen (NORCO) 10-325 MG tablet Take 1 tablet by mouth 2 (two) times daily as needed. 45 tablet 0   Semaglutide, 1 MG/DOSE, (OZEMPIC, 1 MG/DOSE,) 4 MG/3ML SOPN Inject 1 mg into the skin once a week. 3 mL 2   Methylcellulose, Laxative, (CITRUCEL PO) Take by mouth.     No facility-administered medications prior to visit.    Allergies  Allergen  Reactions   Flagyl [Metronidazole] Anaphylaxis and Rash   Other    Penicillins Anaphylaxis and Other (See Comments)    Has patient had a PCN reaction causing immediate rash, facial/tongue/throat swelling, SOB or lightheadedness with hypotension: Yes Has patient had a PCN reaction causing severe rash involving mucus membranes or skin necrosis: No Has patient had a PCN reaction that required hospitalization No Has patient had a PCN reaction occurring within the last 10 years: No If all of the above answers are "NO", then may proceed with Cephalosporin use.   Doxycycline Hives    ROS Review of Systems  Constitutional:  Positive for unexpected weight change. Negative for chills, fatigue and fever.       Patient has had no weight gain or weight loss since last visit when Ozempic was increased to 1 mg weekly.  HENT:  Negative for congestion, postnasal drip, rhinorrhea, sinus pressure and sinus pain.   Eyes: Negative.   Respiratory:  Negative for cough, chest tightness and shortness of breath.   Cardiovascular:  Negative for chest pain, palpitations and leg swelling.  Gastrointestinal:  Negative for abdominal distention, constipation, diarrhea, nausea and vomiting.       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  Is following up with GI.  Endocrine: Negative for cold intolerance, heat intolerance, polydipsia and polyuria.  Genitourinary: Negative.   Musculoskeletal:  Positive for arthralgias and myalgias. Negative for back pain.  Skin:  Negative for rash.  Allergic/Immunologic: Positive for environmental allergies.  Neurological:  Negative for dizziness and weakness.       Severe neuropathy in both feet.   Hematological:  Negative for adenopathy.  Psychiatric/Behavioral:  Positive for dysphoric mood and sleep disturbance. The patient is nervous/anxious.        Patient reports feeling a little down recently.  Feels like this is because she is working from  home and not  getting out and interacting with people like normal.  We will be changing after upcoming vacation.     Objective:    Physical Exam Vitals and nursing note reviewed.  Constitutional:      Appearance: Normal appearance. She is well-developed. She is obese.  HENT:     Head: Normocephalic and atraumatic.  Eyes:     Extraocular Movements: Extraocular movements intact.     Conjunctiva/sclera: Conjunctivae normal.     Pupils: Pupils are equal, round, and reactive to light.  Cardiovascular:     Rate and Rhythm: Normal rate and regular rhythm.     Pulses: Normal pulses.     Heart sounds: Normal heart sounds.  Pulmonary:     Effort: Pulmonary effort is normal.     Breath sounds: Normal breath sounds.  Abdominal:     Palpations: Abdomen is soft.  Musculoskeletal:        General: Normal range of motion.     Cervical back: Normal range of motion and neck supple.  Skin:    General: Skin is warm and dry.     Capillary Refill: Capillary refill takes less than 2 seconds.  Neurological:     General: No focal deficit present.     Mental Status: She is alert and oriented to person, place, and time.  Psychiatric:        Mood and Affect: Mood normal.        Behavior: Behavior normal.        Thought Content: Thought content normal.        Judgment: Judgment normal.    Today's Vitals   11/21/20 1014  BP: 112/68  Pulse: 83  Temp: (!) 96.7 F (35.9 C)  SpO2: 97%  Weight: 216 lb 9.6 oz (98.2 kg)  Height: 5' 4"  (1.626 m)   Body mass index is 37.18 kg/m.   Wt Readings from Last 3 Encounters:  11/21/20 216 lb 9.6 oz (98.2 kg)  11/05/20 216 lb (98 kg)  10/11/20 216 lb 8 oz (98.2 kg)     Health Maintenance Due  Topic Date Due   HIV Screening  Never done   Hepatitis C Screening  Never done   TETANUS/TDAP  Never done   Zoster Vaccines- Shingrix (1 of 2) Never done   MAMMOGRAM  07/19/2020   OPHTHALMOLOGY EXAM  08/08/2020   DEXA SCAN  Never done   PNA vac Low Risk Adult (1 of 2 -  PCV13) Never done    There are no preventive care reminders to display for this patient.  Lab Results  Component Value Date   TSH 2.080 08/07/2020   Lab Results  Component Value Date   WBC 6.3 10/01/2020   HGB 13.6 10/01/2020   HCT 42.4 10/01/2020   MCV 97.0 10/01/2020   PLT 262 10/01/2020   Lab Results  Component Value Date   NA 138 10/01/2020   K 4.1 10/01/2020   CO2 25 10/01/2020   GLUCOSE 127 (H) 10/01/2020   BUN 10 10/01/2020   CREATININE 0.96 10/01/2020   BILITOT 0.6 10/01/2020   ALKPHOS 99 10/01/2020   AST 27 10/01/2020   ALT 15 10/01/2020   PROT 7.1 10/01/2020   ALBUMIN 3.9 10/01/2020   CALCIUM 9.0 10/01/2020   ANIONGAP 11 10/01/2020   EGFR 73 08/07/2020   Lab Results  Component Value Date   CHOL 174 08/07/2020   Lab Results  Component Value Date   HDL 57 08/07/2020   Lab  Results  Component Value Date   LDLCALC 75 08/07/2020   Lab Results  Component Value Date   TRIG 259 (H) 08/07/2020   Lab Results  Component Value Date   CHOLHDL 3.1 08/07/2020   Lab Results  Component Value Date   HGBA1C 6.1 (H) 08/07/2020      Assessment & Plan:  1. Prediabetes Change Ozempic to [place] daily.  Instructions to titrate Saxenda from 0 point milligrams daily to 3 mg daily reviewed with patient.  She voiced understanding.  We will monitor hemoglobin A1c at next visit. - Liraglutide -Weight Management (SAXENDA) 18 MG/3ML SOPN; Inject up to 32m [place] qd as tolerated - patient should start with 0.6284mdaily for 1 week, then gradually titrate dose up to 84m35maily as tolerated.  Dispense: 15 mL; Refill: 2  2. Body mass index (BMI) of 37.0-37.9 in adult Change Ozempic to SaxMorvenily.  Instructions to titrate Saxenda from 0 point milligrams daily to 3 mg daily reviewed with patient.  She voiced understanding.  To limit calorie intake to 1200 to 1500 cal/day.  She should gradually incorporate exercise into her daily routine. - Insulin Pen Needle (PEN NEEDLES 3/16") 31G  X 5 MM MISC; To use daily with saxenda perscriptoin  Dispense: 90 each; Refill: 3 - Liraglutide -Weight Management (SAXENDA) 18 MG/3ML SOPN; Inject up to 84mg9m qd as tolerated - patient should start with 0.6mg 70mly for 1 week, then gradually titrate dose up to 84mg d584my as tolerated.  Dispense: 15 mL; Refill: 2  3. Peripheral neuropathic pain Patient should continue with gabapentin as prescribed.  May take hydrocodone/APAP/325 mg at bedtime only.Reviewed risks and possible side effects associated with taking opiates, benzodiazepines and other CNS depressants. Combination of these could cause dizziness and drowsiness. Advised patient not to drive or operate machinery when taking these medications, as patient's and other's life can be at risk and will have consequences. Patient verbalized understanding in this matter.  Two 30-day prescription-day prescriptions were sent to her pharmacy today.  Dates are 11/21/2020 and 12/20/2020. - HYDROcodone-acetaminophen (NORCO) 10-325 MG tablet; Take 1 tablet by mouth 2 (two) times daily as needed.  Dispense: 45 tablet; Refill: 0  4. Restless legs syndrome Patient should continue Requip as previously prescribed.  5. Essential hypertension Stable.  Continue blood pressure medication as prescribed.  Problem List Items Addressed This Visit       Cardiovascular and Mediastinum   Essential hypertension     Other   Peripheral neuropathic pain   Relevant Medications   HYDROcodone-acetaminophen (NORCO) 10-325 MG tablet   Restless legs syndrome   Prediabetes - Primary   Relevant Medications   Liraglutide -Weight Management (SAXENDA) 18 MG/3ML SOPN   Body mass index (BMI) of 37.0-37.9 in adult   Relevant Medications   Insulin Pen Needle (PEN NEEDLES 3/16") 31G X 5 MM MISC   Liraglutide -Weight Management (SAXENDA) 18 MG/3ML SOPN    Meds ordered this encounter  Medications   DISCONTD: HYDROcodone-acetaminophen (NORCO) 10-325 MG tablet    Sig: Take 1 tablet  by mouth 2 (two) times daily as needed.    Dispense:  45 tablet    Refill:  0    This is not new prescription. This is ongoing treatment for chronic pain. Increasing to #45 tablets for this month early. May fill early if needed. This time only.    Order Specific Question:   Supervising Provider    Answer:   METHENBeatrice [id number]]   DISCONTD: Liraglutide -  Weight Management (SAXENDA) 18 MG/3ML SOPN    Sig: Inject up to mg [place] qd as tolerated    Dispense:  15 mL    Refill:  2    Please fill as 90 day prescription. Patient will have manufacturer coupon.    Order Specific Question:   Supervising Provider    Answer:   Beatrice Lecher D [[id number]]   Insulin Pen Needle (PEN NEEDLES 3/16") 31G X 5 MM MISC    Sig: To use daily with saxenda perscriptoin    Dispense:  90 each    Refill:  3    Order Specific Question:   Supervising Provider    Answer:   Hali Marry [[id number]]   HYDROcodone-acetaminophen (NORCO) 10-325 MG tablet    Sig: Take 1 tablet by mouth 2 (two) times daily as needed.    Dispense:  45 tablet    Refill:  0    This is not new prescription. This is ongoing treatment for chronic pain. Fill on or after 12/20/2020    Order Specific Question:   Supervising Provider    Answer:   Beatrice Lecher D [[id number]]   Liraglutide -Weight Management (SAXENDA) 18 MG/3ML SOPN    Sig: Inject up to 79m [place] qd as tolerated - patient should start with 0.651mdaily for 1 week, then gradually titrate dose up to 72m372maily as tolerated.    Dispense:  15 mL    Refill:  2    Please fill as 90 day prescription. Patient will have manufacturer coupon.    Order Specific Question:   Supervising Provider    Answer:   METBeatrice Lecher[[id number]]     Follow-up: Return in about 7 weeks (around 01/09/2021) for health maintenance exam.    HeaRonnell FreshwaterP

## 2020-11-25 ENCOUNTER — Other Ambulatory Visit: Payer: Self-pay | Admitting: Nurse Practitioner

## 2020-11-25 DIAGNOSIS — R7303 Prediabetes: Secondary | ICD-10-CM

## 2020-11-25 MED ORDER — SEMAGLUTIDE (2 MG/DOSE) 8 MG/3ML ~~LOC~~ SOPN: 2.0000 mg | PEN_INJECTOR | SUBCUTANEOUS | 1 refills | Status: DC

## 2020-11-25 NOTE — Progress Notes (Signed)
Changed saxenda to ozempic 78m weekly  and sent new prescription to [facility].

## 2020-11-26 ENCOUNTER — Ambulatory Visit: Payer: BC Managed Care – PPO | Admitting: Nurse Practitioner

## 2020-11-29 ENCOUNTER — Other Ambulatory Visit: Payer: Self-pay | Admitting: Nurse Practitioner

## 2020-11-29 DIAGNOSIS — E782 Mixed hyperlipidemia: Secondary | ICD-10-CM

## 2020-11-29 DIAGNOSIS — G2581 Restless legs syndrome: Secondary | ICD-10-CM

## 2020-12-03 ENCOUNTER — Telehealth: Payer: Self-pay | Admitting: Nurse Practitioner

## 2020-12-03 NOTE — Telephone Encounter (Signed)
Patient left a message on voicemail about her Semaglutide prescription.  Patient went to pick-up medication at [facility] and it was over $950.00.  Patient did not get the medication due to cost.  Patient stated [facility] has sent over a medication request to have this filled through them.  Patient would like to have this filled through [facility].  It is much cheaper.  Please advise if this has been done.   Semaglutide, 2 MG/DOSE, 8 MG/3ML SOPN [id number]    Order Details Dose: 2 mg Route: Injection Frequency: Weekly  Dispense Quantity: 9 mL Refills: 1   Note to Pharmacy: Please note '2mg'$  weekly dosing  Indications of Use: Type 2 Diabetes Mellitus       Sig: Inject 2 mg as directed once a week.       Start Date: 11/25/20 End Date: --  Written Date: 11/25/20 Expiration Date: 11/25/21     Diagnosis Association: Prediabetes (R73.03)      Order Questions  Question Answer Comment  Supervising Provider Beatrice Lecher D        Providers  Authorizing Provider:   Ronnell Freshwater, NP  [place], [place] [address]  Phone:  [phone]   Fax:  [phone]  DEA #:  [id number]   NPI:  [id number] Supervising Provider:   Hali Marry, [address] [place], [place] [address]  Phone:  [phone]   Fax:  [phone]  DEA #:  [id number]   NPI:  [id number]     Ordering User:  Ronnell Freshwater, NP         Pharmacy  [facility], [address] [place] [address]  Phone:  [phone]  Fax:  [phone]  DEA #:  --  DAW Reason: --

## 2020-12-04 ENCOUNTER — Other Ambulatory Visit: Payer: Self-pay | Admitting: Nurse Practitioner

## 2020-12-04 DIAGNOSIS — R7303 Prediabetes: Secondary | ICD-10-CM

## 2020-12-04 MED ORDER — SEMAGLUTIDE (2 MG/DOSE) 8 MG/3ML ~~LOC~~ SOPN: 2.0000 mg | PEN_INJECTOR | SUBCUTANEOUS | 1 refills | Status: DC

## 2020-12-04 NOTE — Telephone Encounter (Signed)
I sent a new, 90 day prescription for ozempic '2mg'$  per week to [facility] for her. Hopefully that will improve her costs.

## 2020-12-04 NOTE — Progress Notes (Signed)
[facility] for ozempic '2mg'$  weekly.

## 2020-12-04 NOTE — Telephone Encounter (Signed)
Called pt she is advised of her rx for Ozempic that was sent to [facility] pt stated she also seen the message through [employer]

## 2020-12-05 ENCOUNTER — Other Ambulatory Visit: Payer: Self-pay | Admitting: Nurse Practitioner

## 2020-12-05 DIAGNOSIS — I739 Peripheral vascular disease, unspecified: Secondary | ICD-10-CM

## 2020-12-05 DIAGNOSIS — F3341 Major depressive disorder, recurrent, in partial remission: Secondary | ICD-10-CM

## 2020-12-05 DIAGNOSIS — R600 Localized edema: Secondary | ICD-10-CM

## 2020-12-05 MED ORDER — FUROSEMIDE 20 MG PO TABS: 20.0000 mg | ORAL_TABLET | Freq: Every day | ORAL | 0 refills | Status: DC

## 2020-12-05 MED ORDER — CILOSTAZOL 100 MG PO TABS: 100.0000 mg | ORAL_TABLET | Freq: Two times a day (BID) | ORAL | 0 refills | Status: DC

## 2020-12-05 MED ORDER — ESCITALOPRAM OXALATE 10 MG PO TABS: 10.0000 mg | ORAL_TABLET | Freq: Every day | ORAL | 0 refills | Status: DC

## 2020-12-05 NOTE — Telephone Encounter (Signed)
Patient is requesting refill of Gabapentin. This medication not previously prescribed by our clinic.    [facility].

## 2020-12-06 ENCOUNTER — Telehealth: Payer: Self-pay | Admitting: Nurse Practitioner

## 2020-12-06 DIAGNOSIS — G43009 Migraine without aura, not intractable, without status migrainosus: Secondary | ICD-10-CM

## 2020-12-06 MED ORDER — BUTALBITAL-APAP-CAFFEINE 50-325-40 MG PO TABS: 1.0000 | ORAL_TABLET | Freq: Four times a day (QID) | ORAL | 2 refills | Status: AC | PRN

## 2020-12-06 NOTE — Telephone Encounter (Signed)
Katrina is faxing the order for fioricet to [facility] now.

## 2020-12-06 NOTE — Telephone Encounter (Signed)
Request for Gabapentin sent to [facility] yesterday and she denied the refill. This medication was not prescribed by [place] originally and patient should contact prescribing provider or schedule an apt with Nira Conn to discuss our office taking over this medication. AS, CMA

## 2020-12-06 NOTE — Telephone Encounter (Signed)
Wendy Hubbard called from [facility] to get medication refill on the below medications.    butalbital-acetaminophen-caffeine (FIORICET) 50-325-40 MG tablet [id number]    Order Details Dose: 1-2 tablet Route: Oral Frequency: Every 6 hours PRN for headache  Dispense Quantity: 30 tablet Refills: 2   Indications of Use: Tension Headache       Sig: Take 1-2 tablets by mouth every 6 (six) hours as needed for headache.       Start Date: 12/22/19 End Date: 12/21/20  Written Date: 12/22/19 Expiration Date: 06/19/20     Diagnosis Association: Migraine without aura and without status migrainosus, not intractable (G43.009)  Original Order:  butalbital-acetaminophen-caffeine (FIORICET) 50-325-40 MG tablet [id number]      gabapentin (NEURONTIN) 600 MG tablet [id number]    Order Details Dose, Route, Frequency: As Directed  Dispense Quantity: 360 tablet Refills: 3        Sig: Take one AM, one after lunch, and 2 QHS  Patient taking differently: Take 600-1,200 mg by mouth 3 (three) times daily. '600MG'$ -AM  '600MG'$ -NOON  1,'200MG'$ -PM       Start Date: 11/08/19 End Date: --  Written Date: 11/08/19 Expiration Date: 11/07/20   Providers

## 2020-12-17 ENCOUNTER — Telehealth: Payer: Self-pay | Admitting: Nurse Practitioner

## 2020-12-17 DIAGNOSIS — I739 Peripheral vascular disease, unspecified: Secondary | ICD-10-CM

## 2020-12-17 MED ORDER — CILOSTAZOL 100 MG PO TABS: 100.0000 mg | ORAL_TABLET | Freq: Two times a day (BID) | ORAL | 0 refills | Status: DC

## 2020-12-17 NOTE — Addendum Note (Signed)
Addended by: Mickel Crow on: 12/17/2020 11:48 AM   Modules accepted: Orders

## 2020-12-17 NOTE — Telephone Encounter (Signed)
Patient needs her cilostazol and would like it sent to [facility]. Thanks

## 2020-12-17 NOTE — Telephone Encounter (Signed)
This [facility] on 12/05/20. Is [facility] not using [facility]?

## 2020-12-20 DIAGNOSIS — D2371 Other benign neoplasm of skin of right lower limb, including hip: Secondary | ICD-10-CM | POA: Diagnosis not present

## 2020-12-20 DIAGNOSIS — L821 Other seborrheic keratosis: Secondary | ICD-10-CM | POA: Diagnosis not present

## 2020-12-20 DIAGNOSIS — D1801 Hemangioma of skin and subcutaneous tissue: Secondary | ICD-10-CM | POA: Diagnosis not present

## 2020-12-20 DIAGNOSIS — D485 Neoplasm of uncertain behavior of skin: Secondary | ICD-10-CM | POA: Diagnosis not present

## 2020-12-23 ENCOUNTER — Other Ambulatory Visit: Payer: Self-pay

## 2020-12-23 ENCOUNTER — Ambulatory Visit (INDEPENDENT_AMBULATORY_CARE_PROVIDER_SITE_OTHER): Payer: BC Managed Care – PPO | Admitting: Gastroenterology

## 2020-12-23 ENCOUNTER — Encounter: Payer: Self-pay | Admitting: Gastroenterology

## 2020-12-23 VITALS — BP 121/69 | HR 90 | Temp 97.6°F | Ht 64.0 in | Wt 220.4 lb

## 2020-12-23 DIAGNOSIS — K529 Noninfective gastroenteritis and colitis, unspecified: Secondary | ICD-10-CM | POA: Diagnosis not present

## 2020-12-23 DIAGNOSIS — R112 Nausea with vomiting, unspecified: Secondary | ICD-10-CM | POA: Diagnosis not present

## 2020-12-23 NOTE — Progress Notes (Signed)
Primary Care Physician: Ronnell Freshwater, NP  Primary Gastroenterologist:  Dr. Lucilla Lame  Chief Complaint  Patient presents with   Nausea    Bloating     HPI: Wendy Hubbard is a 65 y.o. female here with a history of seeing me back in April for diarrhea.  At that time the patient had a colonoscopy with biopsies of the terminal ileum and throughout the colon with a polyp found.  The polyp was found to be a inflammatory polyp but no cause for the patient's diarrhea was seen.  The patient was now has been taking Ozempic.  The patient was also positive for COVID back in May of this year.  The patient reports that she has been having diarrhea episodes sometimes after she eats and sometimes the middle night.  She reports that she has urgency and has to run to the bathroom very quickly.  The patient also reports she is status post gastric bypass surgery.  There is no report of any unexplained weight loss.  She also reports that she vomits up food that she ate many hours before and has a history of diabetes.   Past Medical History:  Diagnosis Date   Allergy    [employer] 07/23/2020   Anxiety    [employer] 07/23/2020   COPD (chronic obstructive pulmonary disease) (HCC)    DDD (degenerative disc disease), thoracic    Depression    [employer] 07/23/2020   Diabetes mellitus without complication ([place])    [employer] 07/23/2020   GERD (gastroesophageal reflux disease)    Hyperlipidemia    Hypertension    Rapid heart rate    Sleep apnea    Wears contact lenses     Current Outpatient Medications  Medication Sig Dispense Refill   albuterol (VENTOLIN HFA) 108 (90 Base) MCG/ACT inhaler Inhale 2 puffs into the lungs every 6 (six) hours as needed for wheezing or shortness of breath. 1 each 2   alosetron (LOTRONEX) 1 MG tablet Take 1 tablet (1 mg total) by mouth 2 (two) times daily. 60 tablet 5   ALPRAZolam (XANAX) 0.5 MG tablet Take half to one tab a day prn only for acute anxiety or panic attack 30  tablet 2   butalbital-acetaminophen-caffeine (FIORICET) 50-325-40 MG tablet Take 1-2 tablets by mouth every 6 (six) hours as needed for headache. 30 tablet 2   cilostazol (PLETAL) 100 MG tablet Take 1 tablet (100 mg total) by mouth 2 (two) times daily. 180 tablet 0   diphenoxylate-atropine (LOMOTIL) 2.5-0.025 MG tablet Take 1 tablet by mouth 4 (four) times daily as needed for diarrhea or loose stools. 30 tablet 1   escitalopram (LEXAPRO) 10 MG tablet Take 1 tablet (10 mg total) by mouth daily. 180 tablet 0   furosemide (LASIX) 20 MG tablet Take 1 tablet (20 mg total) by mouth daily. Take once a day as needed edema 90 tablet 0   gabapentin (NEURONTIN) 600 MG tablet Take one AM, one after lunch, and 2 QHS (Patient taking differently: Take 600-1,200 mg by mouth 3 (three) times daily. '600MG'$ -AM '600MG'$ -NOON 1,'200MG'$ -PM) 360 tablet 3   HYDROcodone-acetaminophen (NORCO) 10-325 MG tablet Take 1 tablet by mouth 2 (two) times daily as needed. 45 tablet 0   Insulin Pen Needle (PEN NEEDLES 3/16") 31G X 5 MM MISC To use daily with saxenda perscriptoin 90 each 3   loratadine (CLARITIN) 10 MG tablet Take 10 mg by mouth daily as needed for allergies.     metoprolol succinate (TOPROL-XL) 50  MG 24 hr tablet Take 1.5 tablets (75 mg total) by mouth at bedtime. Take with or immediately following a meal. 135 tablet 1   mirtazapine (REMERON) 15 MG tablet Take 1 tablet (15 mg total) by mouth at bedtime. For sleep 90 tablet 1   montelukast (SINGULAIR) 10 MG tablet TAKE 1 TABLET BY MOUTH AT BEDTIME 90 tablet 0   Multiple Vitamins-Minerals (BARIATRIC MULTIVITAMINS/IRON PO) Take by mouth.     ondansetron (ZOFRAN-ODT) 4 MG disintegrating tablet Take 1 to 2 tablets po TID prn nausea 45 tablet 1   pantoprazole (PROTONIX) 40 MG tablet TAKE 1 TABLET BY MOUTH DAILY 90 tablet 1   polyethylene glycol-electrolytes (GAVILYTE-N WITH FLAVOR PACK) 420 g solution Drink one 8 oz glass every 20 mins until entire container is finished starting  at 5:00pm on 08/29/20 4000 mL 0   Probiotic Product (PROBIOTIC PO) Take by mouth.     rOPINIRole (REQUIP) 0.5 MG tablet TAKE 1 TABLET BY MOUTH AT BEDTIME MAY INCREASE TO 2 TABS AT BEDTIME AS NEEDED TOLERATED 180 tablet 1   rosuvastatin (CRESTOR) 10 MG tablet TAKE 1 TABLET BY MOUTH ONCE DAILY 90 tablet 1   Semaglutide, 2 MG/DOSE, 8 MG/3ML SOPN Inject 2 mg as directed once a week. 9 mL 1   Sod Picosulfate-Mag Ox-Cit Acd (CLENPIQ) 10-3.5-12 MG-GM -GM/160ML SOLN Take 320 mLs by mouth as directed. 320 mL 0   tiZANidine (ZANAFLEX) 4 MG tablet Take 1 tablet po BID prn muscle pain/spasms. (Patient taking differently: Take 4 mg by mouth every 6 (six) hours as needed.) 45 tablet 3   No current facility-administered medications for this visit.    Allergies as of 12/23/2020 - Review Complete 12/23/2020  Allergen Reaction Noted   Flagyl [metronidazole] Anaphylaxis and Rash 08/22/2014   Other  07/23/2020   Penicillins Anaphylaxis and Other (See Comments) 08/22/2014   Doxycycline Hives 08/22/2014    ROS:  General: Negative for anorexia, weight loss, fever, chills, fatigue, weakness. ENT: Negative for hoarseness, difficulty swallowing , nasal congestion. CV: Negative for chest pain, angina, palpitations, dyspnea on exertion, peripheral edema.  Respiratory: Negative for dyspnea at rest, dyspnea on exertion, cough, sputum, wheezing.  GI: See history of present illness. GU:  Negative for dysuria, hematuria, urinary incontinence, urinary frequency, nocturnal urination.  Endo: Negative for unusual weight change.    Physical Examination:   BP 121/69 (BP Location: Left Arm, Patient Position: Sitting, Cuff Size: Large)   Pulse 90   Temp 97.6 F (36.4 C) (Temporal)   Ht '5\' 4"'$  (1.626 m)   Wt 220 lb 6.4 oz (100 kg)   BMI 37.83 kg/m   General: Well-nourished, well-developed in no acute distress.  Eyes: No icterus. Conjunctivae pink. Neuro: Alert and oriented x 3.  Grossly intact. Skin: Warm and dry, no  jaundice.   Psych: Alert and cooperative, normal mood and affect.  Labs:    Imaging Studies: No results found.  Assessment and Plan:   Wendy Hubbard is a 65 y.o. y/o female who comes in today with a history of diarrhea with a colonoscopy with biopsies of the colon and terminal ileum not showing any cause for the diarrhea.  The patient was sent a test for bacterial overgrowth but never did it.  She states she still has the test at home.  The patient will complete that test and will be set up for a gastric emptying study due to her history of diabetes and vomiting solid food many hours after she had eaten.  She will also go back on the Lotronex twice a day since she states that the Lotronex once a day is not working for her.  She was supplemented with Imodium.  The patient will be contacted with the results of her tests when they come back.  The patient has been explained the plan and agrees with it.     Lucilla Lame, MD. [facility]al    Note: This dictation was prepared with Dragon dictation along with smaller phrase technology. Any transcriptional errors that result from this process are unintentional.

## 2020-12-26 ENCOUNTER — Other Ambulatory Visit: Payer: Self-pay

## 2020-12-26 MED ORDER — ALOSETRON HCL 1 MG PO TABS: 1.0000 mg | ORAL_TABLET | Freq: Two times a day (BID) | ORAL | 5 refills | Status: AC

## 2021-01-02 ENCOUNTER — Encounter: Payer: BC Managed Care – PPO | Admitting: Nurse Practitioner

## 2021-01-06 ENCOUNTER — Encounter: Payer: BC Managed Care – PPO | Admitting: Hospice and Palliative Medicine

## 2021-01-09 ENCOUNTER — Ambulatory Visit (INDEPENDENT_AMBULATORY_CARE_PROVIDER_SITE_OTHER): Payer: BC Managed Care – PPO | Admitting: Nurse Practitioner

## 2021-01-09 ENCOUNTER — Encounter: Payer: Self-pay | Admitting: Nurse Practitioner

## 2021-01-09 ENCOUNTER — Other Ambulatory Visit: Payer: Self-pay

## 2021-01-09 VITALS — BP 106/72 | HR 64 | Temp 98.0°F | Ht 64.0 in | Wt 218.3 lb

## 2021-01-09 DIAGNOSIS — R300 Dysuria: Secondary | ICD-10-CM

## 2021-01-09 DIAGNOSIS — E119 Type 2 diabetes mellitus without complications: Secondary | ICD-10-CM | POA: Diagnosis not present

## 2021-01-09 DIAGNOSIS — M51369 Other intervertebral disc degeneration, lumbar region without mention of lumbar back pain or lower extremity pain: Secondary | ICD-10-CM | POA: Insufficient documentation

## 2021-01-09 DIAGNOSIS — M5136 Other intervertebral disc degeneration, lumbar region: Secondary | ICD-10-CM

## 2021-01-09 DIAGNOSIS — Z0000 Encounter for general adult medical examination without abnormal findings: Secondary | ICD-10-CM | POA: Diagnosis not present

## 2021-01-09 DIAGNOSIS — M4316 Spondylolisthesis, lumbar region: Secondary | ICD-10-CM | POA: Insufficient documentation

## 2021-01-09 DIAGNOSIS — M792 Neuralgia and neuritis, unspecified: Secondary | ICD-10-CM | POA: Diagnosis not present

## 2021-01-09 LAB — POCT UA - MICROALBUMIN

## 2021-01-09 LAB — POCT URINALYSIS DIP (CLINITEK)
Bilirubin, UA: NEGATIVE
Blood, UA: NEGATIVE
Glucose, UA: NEGATIVE mg/dL
Ketones, POC UA: NEGATIVE mg/dL
Leukocytes, UA: NEGATIVE
Nitrite, UA: NEGATIVE
POC PROTEIN,UA: NEGATIVE
Spec Grav, UA: 1.02 (ref 1.010–1.025)
Urobilinogen, UA: 0.2 E.U./dL
pH, UA: 6.5 (ref 5.0–8.0)

## 2021-01-09 MED ORDER — HYDROCODONE-ACETAMINOPHEN 10-325 MG PO TABS: 1.0000 | ORAL_TABLET | Freq: Two times a day (BID) | ORAL | 0 refills | Status: DC | PRN

## 2021-01-09 NOTE — Progress Notes (Signed)
Established Patient Office Visit  Subjective:  Patient ID: Wendy Hubbard, female    DOB: 1956-02-02  Age: 65 y.o. MRN: [id number]  CC:  Chief Complaint  Patient presents with   Annual Exam    HPI Wendy Hubbard presents for health maintenance exam.  She states that she has noticed a strong odor to her urine.  Has been present for the last 2 months or so.  She has had urinary tract infections in the past and her only symptom was a strong odor in the urine.  She would like to be checked for urinary tract infection today.  She denies nausea, abdominal pain, dysuria, or urinary frequency. Does have a chronic and worsening peripheral neuropathy in both feet.  Have an EMG done on 06/21/2020 through [facility].  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  She states that pain in her feet is most severe during the night.  She states that burning type pain is excruciating.  She states that even the sheets touching her feet makes pain worse.  She does take a dose of hydrocodone/APAP 10/325 mg to help this pain at night and to help her rest.  She is due to have a prescription given for 01/18/2021.  Her PDMP profile was reviewed today.  Her overdose risk score is 210.  Her last fill of hydrocodone/APAP was 11/21/2020.  There should be 1 prescription on file in her pharmacy dated 12/20/2020 which she has not had filled yet.  Today, a controlled substances agreement policy was completed and signed by the patient and will be uploaded into her chart. Of note, the patient has been having pain in her lumbar spine.  She has been reporting this pain since 2020.  An x-ray was done of her lumbar spine was done on 07/13/2020.  Though no acute bony abnormalities were found, it did show stable mild levoscoliosis of the lower lumbar spine.  She has also been having neck pain, radiating into the left shoulder.  At 1 point this was to severe, she was seen in the emergency department.  An MRI of the left shoulder  was done on 12/14/2019.  The MRI did show moderate AC osteoarthritis small spurs.  She also had subacromial/subdeltoid bursitis and mild glenohumeral degenerative disease.  An MRI of her cervical spine, also done 12/14/2019, showed multilevel degenerative disc disease.  This was most severe at C5/C6 and C6/C7 levels where there is moderate spinal stenosis and moderate to severe neural foraminal stenosis.  These results in mind and the above-mentioned symptoms, a referral to orthopedics and/or neurosurgery should be considered in the near future. She does continue to have weight gain.  She has had a 2 pound weight gain since her last visit.  She continues to calorie intake to 1500 cal or less each day.  She also incorporates exercise into her daily routine. She has no other concerns or complaints today.  She denies chest pain, chest pressure, or shortness of breath. She denies headaches or visual disturbances. She denies abdominal pain, nausea, vomiting, or changes in bowel or bladder habits.    Past Medical History:  Diagnosis Date   Allergy    Phreesia 07/23/2020   Anxiety    Phreesia 07/23/2020   COPD (chronic obstructive pulmonary disease) (HCC)    DDD (degenerative disc disease), thoracic    Depression    Phreesia 07/23/2020   Diabetes mellitus without complication ([place])    Phreesia 07/23/2020   GERD (gastroesophageal reflux disease)  Hyperlipidemia    Hypertension    Rapid heart rate    Sleep apnea    Wears contact lenses     Past Surgical History:  Procedure Laterality Date   ABDOMINAL HYSTERECTOMY     ANKLE ARTHROSCOPY Bilateral    APPENDECTOMY     BLADDER SUSPENSION     BREAST EXCISIONAL BIOPSY Left 1990   neg   BREAST EXCISIONAL BIOPSY Right 1989   neg   BREAST SURGERY     CHOLECYSTECTOMY     COLONOSCOPY N/A 11/04/2015   Procedure: COLONOSCOPY;  Surgeon: Manya Silvas, MD;  Location: [place];  Service: Endoscopy;  Laterality: N/A;   COLONOSCOPY WITH PROPOFOL N/A  08/30/2020   Procedure: COLONOSCOPY WITH PROPOFOL;  Surgeon: Lucilla Lame, MD;  Location: [place];  Service: Endoscopy;  Laterality: N/A;   ESOPHAGOGASTRODUODENOSCOPY N/A 11/02/2015   Procedure: ESOPHAGOGASTRODUODENOSCOPY (EGD);  Surgeon: Manya Silvas, MD;  Location[facility] ENDOSCOPY;  Service: Endoscopy;  Laterality: N/A;   KNEE ARTHROSCOPY Left    POLYPECTOMY  08/30/2020   Procedure: POLYPECTOMY;  Surgeon: Lucilla Lame, MD;  Location: [facility];  Service: Endoscopy;;   REDUCTION MAMMAPLASTY Bilateral 1988   TONSILLECTOMY      Family History  Problem Relation Age of Onset   Hypertension Other    Bladder Cancer Mother    High Cholesterol Mother    High blood pressure Mother    Cancer Mother    Heart attack Father    High blood pressure Father    Kidney cancer Neg Hx    Prostate cancer Neg Hx    Breast cancer Neg Hx     Social History   Socioeconomic History   Marital status: Married    Spouse name: Not on file   Number of children: Not on file   Years of education: Not on file   Highest education level: Not on file  Occupational History   Not on file  Tobacco Use   Smoking status: Former   Smokeless tobacco: Never   Tobacco comments:    quit in 1996  Vaping Use   Vaping Use: Never used  Substance and Sexual Activity   Alcohol use: Yes    Alcohol/week: 2.0 standard drinks    Types: 1 Glasses of wine, 1 Standard drinks or equivalent per week    Comment: occasional   Drug use: No   Sexual activity: Yes    Partners: Male  Other Topics Concern   Not on file  Social History Narrative   Not on file   Social Determinants of Health   Financial Resource Strain: Not on file  Food Insecurity: Not on file  Transportation Needs: Not on file  Physical Activity: Not on file  Stress: Not on file  Social Connections: Not on file  Intimate Partner Violence: Not on file    Outpatient Medications Prior to Visit  Medication Sig Dispense Refill    alosetron (LOTRONEX) 1 MG tablet Take 1 tablet (1 mg total) by mouth 2 (two) times daily. 60 tablet 5   ALPRAZolam (XANAX) 0.5 MG tablet Take half to one tab a day prn only for acute anxiety or panic attack 30 tablet 2   butalbital-acetaminophen-caffeine (FIORICET) 50-325-40 MG tablet Take 1-2 tablets by mouth every 6 (six) hours as needed for headache. 30 tablet 2   cilostazol (PLETAL) 100 MG tablet Take 1 tablet (100 mg total) by mouth 2 (two) times daily. 180 tablet 0   diphenoxylate-atropine (LOMOTIL) 2.5-0.025 MG tablet  Take 1 tablet by mouth 4 (four) times daily as needed for diarrhea or loose stools. 30 tablet 1   escitalopram (LEXAPRO) 10 MG tablet Take 1 tablet (10 mg total) by mouth daily. 180 tablet 0   furosemide (LASIX) 20 MG tablet Take 1 tablet (20 mg total) by mouth daily. Take once a day as needed edema 90 tablet 0   gabapentin (NEURONTIN) 600 MG tablet Take one AM, one after lunch, and 2 QHS (Patient taking differently: Take 600-1,200 mg by mouth 3 (three) times daily. 600MG-AM 600MG-NOON 1,200MG-PM) 360 tablet 3   Insulin Pen Needle (PEN NEEDLES 3/16") 31G X 5 MM MISC To use daily with saxenda perscriptoin 90 each 3   loratadine (CLARITIN) 10 MG tablet Take 10 mg by mouth daily as needed for allergies.     metoprolol succinate (TOPROL-XL) 50 MG 24 hr tablet Take 1.5 tablets (75 mg total) by mouth at bedtime. Take with or immediately following a meal. 135 tablet 1   mirtazapine (REMERON) 15 MG tablet Take 1 tablet (15 mg total) by mouth at bedtime. For sleep 90 tablet 1   montelukast (SINGULAIR) 10 MG tablet TAKE 1 TABLET BY MOUTH AT BEDTIME 90 tablet 0   Multiple Vitamins-Minerals (BARIATRIC MULTIVITAMINS/IRON PO) Take by mouth.     ondansetron (ZOFRAN-ODT) 4 MG disintegrating tablet Take 1 to 2 tablets po TID prn nausea 45 tablet 1   pantoprazole (PROTONIX) 40 MG tablet TAKE 1 TABLET BY MOUTH DAILY 90 tablet 1   polyethylene glycol-electrolytes (GAVILYTE-N WITH FLAVOR PACK) 420  g solution Drink one 8 oz glass every 20 mins until entire container is finished starting at 5:00pm on 08/29/20 4000 mL 0   Probiotic Product (PROBIOTIC PO) Take by mouth.     rOPINIRole (REQUIP) 0.5 MG tablet TAKE 1 TABLET BY MOUTH AT BEDTIME MAY INCREASE TO 2 TABS AT BEDTIME AS NEEDED TOLERATED 180 tablet 1   rosuvastatin (CRESTOR) 10 MG tablet TAKE 1 TABLET BY MOUTH ONCE DAILY 90 tablet 1   Semaglutide, 2 MG/DOSE, 8 MG/3ML SOPN Inject 2 mg as directed once a week. 9 mL 1   tiZANidine (ZANAFLEX) 4 MG tablet Take 1 tablet po BID prn muscle pain/spasms. (Patient taking differently: Take 4 mg by mouth every 6 (six) hours as needed.) 45 tablet 3   HYDROcodone-acetaminophen (NORCO) 10-325 MG tablet Take 1 tablet by mouth 2 (two) times daily as needed. 45 tablet 0   albuterol (VENTOLIN HFA) 108 (90 Base) MCG/ACT inhaler Inhale 2 puffs into the lungs every 6 (six) hours as needed for wheezing or shortness of breath. (Patient not taking: Reported on 01/09/2021) 1 each 2   Sod Picosulfate-Mag Ox-Cit Acd (CLENPIQ) 10-3.5-12 MG-GM -GM/160ML SOLN Take 320 mLs by mouth as directed. (Patient not taking: Reported on 01/09/2021) 320 mL 0   No facility-administered medications prior to visit.    Allergies  Allergen Reactions   Flagyl [Metronidazole] Anaphylaxis and Rash   Other    Penicillins Anaphylaxis and Other (See Comments)    Has patient had a PCN reaction causing immediate rash, facial/tongue/throat swelling, SOB or lightheadedness with hypotension: Yes Has patient had a PCN reaction causing severe rash involving mucus membranes or skin necrosis: No Has patient had a PCN reaction that required hospitalization No Has patient had a PCN reaction occurring within the last 10 years: No If all of the above answers are "NO", then may proceed with Cephalosporin use.   Doxycycline Hives    ROS Review of Systems  Constitutional:  Negative for activity change, appetite change, chills, fatigue and fever.  HENT:   Negative for congestion, postnasal drip, rhinorrhea, sinus pressure, sinus pain, sneezing and sore throat.   Eyes: Negative.   Respiratory:  Negative for cough, chest tightness, shortness of breath and wheezing.   Cardiovascular:  Negative for chest pain and palpitations.       States that her feet are a bit swollen recently.  Gastrointestinal:  Positive for diarrhea. Negative for abdominal pain, constipation, nausea and vomiting.  Endocrine: Negative for cold intolerance, heat intolerance, polydipsia and polyuria.  Genitourinary:  Negative for dyspareunia, dysuria, flank pain, frequency and urgency.       Patient states she has noted a strong odor to her urine over the past 2 months or so.  She states last time she had this she had a urinary tract infection.  Musculoskeletal:  Negative for arthralgias, back pain and myalgias.  Skin:  Negative for rash.  Allergic/Immunologic: Negative for environmental allergies.  Neurological:  Negative for dizziness, weakness and headaches.  Hematological:  Negative for adenopathy.  Psychiatric/Behavioral:  The patient is not nervous/anxious.      Objective:    Physical Exam Vitals and nursing note reviewed.  Constitutional:      Appearance: Normal appearance. She is well-developed. She is obese.  HENT:     Head: Normocephalic and atraumatic.     Right Ear: Tympanic membrane, ear canal and external ear normal.     Left Ear: Tympanic membrane and external ear normal.     Nose: Nose normal.     Mouth/Throat:     Mouth: Mucous membranes are moist.  Eyes:     Extraocular Movements: Extraocular movements intact.     Conjunctiva/sclera: Conjunctivae normal.     Pupils: Pupils are equal, round, and reactive to light.  Neck:     Vascular: No carotid bruit.  Cardiovascular:     Rate and Rhythm: Normal rate and regular rhythm.     Pulses: Normal pulses.     Heart sounds: Normal heart sounds.  Pulmonary:     Effort: Pulmonary effort is normal.      Breath sounds: Normal breath sounds.  Abdominal:     General: Bowel sounds are normal. There is no distension.     Palpations: Abdomen is soft. There is no mass.  Genitourinary:    Comments: Patient's urine sample was negative for infection or other abnormalities today.  Her urine microalbumin was normal. Musculoskeletal:        General: Normal range of motion.     Cervical back: Normal range of motion and neck supple.  Lymphadenopathy:     Cervical: No cervical adenopathy.  Skin:    General: Skin is warm and dry.     Capillary Refill: Capillary refill takes less than 2 seconds.  Neurological:     General: No focal deficit present.     Mental Status: She is alert and oriented to person, place, and time.  Psychiatric:        Mood and Affect: Mood normal.        Behavior: Behavior normal.        Thought Content: Thought content normal.        Judgment: Judgment normal.   Today's Vitals   01/09/21 1017  BP: 106/72  Pulse: 64  Temp: 98 F (36.7 C)  SpO2: 96%  Weight: 218 lb 4.8 oz (99 kg)  Height: 5' 4" (1.626 m)   Body mass index is 37.47 kg/m.  Wt Readings from Last 3 Encounters:  01/09/21 218 lb 4.8 oz (99 kg)  12/23/20 220 lb 6.4 oz (100 kg)  11/21/20 216 lb 9.6 oz (98.2 kg)     Health Maintenance Due  Topic Date Due   TETANUS/TDAP  Never done   COVID-19 Vaccine (3 - Booster for Pfizer series) 01/29/2020   INFLUENZA VACCINE  12/09/2020    There are no preventive care reminders to display for this patient.  Lab Results  Component Value Date   TSH 2.080 08/07/2020   Lab Results  Component Value Date   WBC 6.3 10/01/2020   HGB 13.6 10/01/2020   HCT 42.4 10/01/2020   MCV 97.0 10/01/2020   PLT 262 10/01/2020   Lab Results  Component Value Date   NA 138 10/01/2020   K 4.1 10/01/2020   CO2 25 10/01/2020   GLUCOSE 127 (H) 10/01/2020   BUN 10 10/01/2020   CREATININE 0.96 10/01/2020   BILITOT 0.6 10/01/2020   ALKPHOS 99 10/01/2020   AST 27 10/01/2020    ALT 15 10/01/2020   PROT 7.1 10/01/2020   ALBUMIN 3.9 10/01/2020   CALCIUM 9.0 10/01/2020   ANIONGAP 11 10/01/2020   EGFR 73 08/07/2020   Lab Results  Component Value Date   CHOL 174 08/07/2020   Lab Results  Component Value Date   HDL 57 08/07/2020   Lab Results  Component Value Date   LDLCALC 75 08/07/2020   Lab Results  Component Value Date   TRIG 259 (H) 08/07/2020   Lab Results  Component Value Date   CHOLHDL 3.1 08/07/2020   Lab Results  Component Value Date   HGBA1C 6.1 (H) 08/07/2020      Assessment & Plan:  1. Routine general medical examination at a health care facility Annual health maintenance exam today.  2. Controlled type 2 diabetes mellitus without complication, without [place]-term current use of insulin ([place]) Blood sugar is currently controlled with diet.  She does take Ozempic 2 mg injections once weekly to help manage blood sugar and also to control weight.  States she is doing well with medication.  Wellness check hemoglobin A1c at next visit.  Urine microalbumin is normal today.  We will need to ensure diabetic eye exam at next visit. - POCT UA - Microalbumin  3. Peripheral neuropathic pain Recent EMG done on lower extremities on 06/21/2020 showing generalized sensory polyneuropathy.  She should continue Requip as prescribed.  May also continue to take hydrocodone/APAP 10/325 mg tablets at night when needed for severe pain in lower extremities.  Concerned that neuropathic pain may be coming from degenerative disc disease of some sort.  Reviewed lumbar spine x-ray from March, 2020.  Nothing acute was found on x-ray.  Will get MRI for further evaluation.  Will consider referral to orthopedics, neurosurgery, and/or pain management in the future.  Did sign a controlled substance agreement for primary care at [facility] on 01/09/2021. - HYDROcodone-acetaminophen (NORCO) 10-325 MG tablet; Take 1 tablet by mouth 2 (two) times daily as needed.  Dispense: 45  tablet; Refill: 0 - MR Lumbar Spine Wo Contrast; Future  4. Other intervertebral disc degeneration, lumbar region Concerned that neuropathic pain may be coming from degenerative disc disease of some sort.  Reviewed lumbar spine x-ray from March, 2020.  Nothing acute was found on x-ray.  Will get MRI for further evaluation.  Will consider referral to orthopedics, neurosurgery, and/or pain management in the future.  - MR Lumbar Spine Wo Contrast; Future  5. Spondylolisthesis of lumbar region Concerned that neuropathic pain may be coming from degenerative disc disease of some sort.  Reviewed lumbar spine x-ray from March, 2020.  Nothing acute was found on x-ray.  Will get MRI for further evaluation.  Will consider referral to orthopedics, neurosurgery, and/or pain management in the future.  - MR Lumbar Spine Wo Contrast; Future  6. Dysuria Urine sample negative for infection or other abnormalities today.  Normal urine microalbumin.  Will monitor. - POCT URINALYSIS DIP (CLINITEK)   Problem List Items Addressed This Visit       Endocrine   Uncontrolled type 2 diabetes mellitus with hyperglycemia (HCC)     Musculoskeletal and Integument   Other intervertebral disc degeneration, lumbar region   Relevant Medications   HYDROcodone-acetaminophen (NORCO) 10-325 MG tablet   Other Relevant Orders   MR Lumbar Spine Wo Contrast   Spondylolisthesis of lumbar region   Relevant Orders   MR Lumbar Spine Wo Contrast     Other   Peripheral neuropathic pain   Relevant Medications   HYDROcodone-acetaminophen (NORCO) 10-325 MG tablet   Other Relevant Orders   MR Lumbar Spine Wo Contrast   Dysuria   Relevant Orders   POCT URINALYSIS DIP (CLINITEK) (Completed)   Routine general medical examination at a health care facility - Primary    Meds ordered this encounter  Medications   HYDROcodone-acetaminophen (NORCO) 10-325 MG tablet    Sig: Take 1 tablet by mouth 2 (two) times daily as needed.     Dispense:  45 tablet    Refill:  0    This is not new prescription. This is ongoing treatment for chronic pain. Fill on or after 01/18/2021    Order Specific Question:   Supervising Provider    Answer:   Beatrice Lecher D [[id number]]   This note was dictated using Dragon Voice Recognition Software. Rapid proofreading was performed to expedite the delivery of the information. Despite proofreading, phonetic errors will occur which are common with this voice recognition software. Please take this into consideration. If there are any concerns, please contact our office.    Follow-up: Return in about 2 months (around 03/11/2021) for bp, meds - review MRI results .    Ronnell Freshwater, NP

## 2021-01-10 ENCOUNTER — Other Ambulatory Visit: Payer: Self-pay

## 2021-01-10 ENCOUNTER — Other Ambulatory Visit: Payer: Self-pay | Admitting: Nurse Practitioner

## 2021-01-10 DIAGNOSIS — K580 Irritable bowel syndrome with diarrhea: Secondary | ICD-10-CM

## 2021-01-10 MED ORDER — DIPHENOXYLATE-ATROPINE 2.5-0.025 MG PO TABS: 1.0000 | ORAL_TABLET | Freq: Four times a day (QID) | ORAL | 1 refills | Status: DC | PRN

## 2021-01-10 NOTE — Telephone Encounter (Signed)
Patient is requesting refills of these medications.   The Gabapentin was last prescribed by Podiatry but patient states you will manage since she does not like the podiatrist. AS, CMA

## 2021-01-14 ENCOUNTER — Encounter: Payer: Self-pay | Admitting: Nurse Practitioner

## 2021-01-14 ENCOUNTER — Ambulatory Visit (INDEPENDENT_AMBULATORY_CARE_PROVIDER_SITE_OTHER): Payer: BC Managed Care – PPO | Admitting: Nurse Practitioner

## 2021-01-14 VITALS — Ht 64.0 in | Wt 218.3 lb

## 2021-01-14 DIAGNOSIS — H109 Unspecified conjunctivitis: Secondary | ICD-10-CM | POA: Diagnosis not present

## 2021-01-14 DIAGNOSIS — M792 Neuralgia and neuritis, unspecified: Secondary | ICD-10-CM

## 2021-01-14 DIAGNOSIS — B9689 Other specified bacterial agents as the cause of diseases classified elsewhere: Secondary | ICD-10-CM

## 2021-01-14 DIAGNOSIS — K580 Irritable bowel syndrome with diarrhea: Secondary | ICD-10-CM

## 2021-01-14 MED ORDER — DIPHENOXYLATE-ATROPINE 2.5-0.025 MG PO TABS: 1.0000 | ORAL_TABLET | Freq: Four times a day (QID) | ORAL | 1 refills | Status: DC | PRN

## 2021-01-14 MED ORDER — GABAPENTIN 600 MG PO TABS: ORAL_TABLET | ORAL | 3 refills | Status: DC

## 2021-01-14 MED ORDER — POLYMYXIN B-TRIMETHOPRIM 10000-0.1 UNIT/ML-% OP SOLN: 1.0000 [drp] | OPHTHALMIC | 0 refills | Status: DC

## 2021-01-14 NOTE — Progress Notes (Signed)
Virtual Visit via Telephone Note  I connected with Wendy Hubbard on 01/20/21 at  1:30 PM EDT by telephone and verified that I am speaking with the correct person using two identifiers.  Location: Patient: home Provider: [place] [facility]     I discussed the limitations, risks, security and privacy concerns of performing an evaluation and management service by telephone and the availability of in person appointments. I also discussed with the patient that there may be a patient responsible charge related to this service. The patient expressed understanding and agreed to proceed.   History of Present Illness: Patient is presenting for acute visit today.  States she thinks she has pinkeye.  Feels like she got this from her granddaughter.  Granddaughter was diagnosed with pinkeye on Friday.  Patient states she has excess tearing in both eyes.  Both eyes are swollen, red, and there is excessive purulent drainage.  States she is unable to open her eyes after calling them for just a short time due to matted drainage.  She states this started in her right eye a few days ago and is gradually presenting in the right eye as well.  Patient denies nasal congestion or sinus type symptoms.  She denies headache, fever, body aches, or chills. Patient states she does need to have new prescriptions for both gabapentin and Lomotil today.  Observations/Objective:  The patient is alert and oriented. She is pleasant and answers all questions appropriately. Breathing is non-labored. She is in no acute distress at this time.    Today's Vitals   01/14/21 1330  Weight: 218 lb 4.8 oz (99 kg)  Height: '5\' 4"'$  (1.626 m)   Body mass index is 37.47 kg/m.   Assessment and Plan: 1. Bacterial conjunctivitis of both eyes Start Polytrim eyedrops.  1 drop into both eyes every 2-3 hours while awake.  Should be done for the next 5 to 7 days.  Recommend she avoid touching eyes with bare hands.  He is a tissue  if touching eyes is unavoidable.  Wash hands after each time he gets the face.  Encouraged a warm compress to the eyes to avoid excess drainage and clumping of discharge.  Patient Voiced understanding. - trimethoprim-polymyxin b (POLYTRIM) ophthalmic solution; Place 1 drop into the right eye every 4 (four) hours.  Dispense: 10 mL; Refill: 0  2. Irritable bowel syndrome with diarrhea Resolved prescription medications Lomotil take as needed for acute diarrhea associated with IBS. - diphenoxylate-atropine (LOMOTIL) 2.5-0.025 MG tablet; Take 1 tablet by mouth 4 (four) times daily as needed for diarrhea or loose stools.  Dispense: 30 tablet; Refill: 1  3. Peripheral neuropathic pain May continue to take gabapentin 600 mg.  Take 1 in the morning take 1 at lunchtime and take 2 at bedtime due to severe peripheral neuropathic pain. - gabapentin (NEURONTIN) 600 MG tablet; Take one AM, one after lunch, and 2 QHS  Dispense: 360 tablet; Refill: 3   Follow Up Instructions:    I discussed the assessment and treatment plan with the patient. The patient was provided an opportunity to ask questions and all were answered. The patient agreed with the plan and demonstrated an understanding of the instructions.   The patient was advised to call back or seek an in-person evaluation if the symptoms worsen or if the condition fails to improve as anticipated.  I provided 15 minutes of non-face-to-face time during this encounter.  This note was dictated using Systems analyst.  Rapid proofreading was performed to expedite the delivery of the information. Despite proofreading, phonetic errors will occur which are common with this voice recognition software. Please take this into consideration. If there are any concerns, please contact our office.    Ronnell Freshwater, NP

## 2021-01-15 DIAGNOSIS — H1033 Unspecified acute conjunctivitis, bilateral: Secondary | ICD-10-CM | POA: Diagnosis not present

## 2021-01-18 ENCOUNTER — Encounter: Payer: Self-pay | Admitting: Nurse Practitioner

## 2021-01-20 DIAGNOSIS — B9689 Other specified bacterial agents as the cause of diseases classified elsewhere: Secondary | ICD-10-CM | POA: Insufficient documentation

## 2021-01-20 DIAGNOSIS — H1032 Unspecified acute conjunctivitis, left eye: Secondary | ICD-10-CM | POA: Diagnosis not present

## 2021-01-20 DIAGNOSIS — H109 Unspecified conjunctivitis: Secondary | ICD-10-CM | POA: Insufficient documentation

## 2021-01-29 DIAGNOSIS — D485 Neoplasm of uncertain behavior of skin: Secondary | ICD-10-CM | POA: Diagnosis not present

## 2021-01-31 ENCOUNTER — Encounter: Payer: BC Managed Care – PPO | Attending: Gastroenterology

## 2021-02-12 DIAGNOSIS — D485 Neoplasm of uncertain behavior of skin: Secondary | ICD-10-CM | POA: Diagnosis not present

## 2021-02-19 ENCOUNTER — Other Ambulatory Visit: Payer: Self-pay | Admitting: Nurse Practitioner

## 2021-02-26 DIAGNOSIS — L648 Other androgenic alopecia: Secondary | ICD-10-CM | POA: Diagnosis not present

## 2021-03-01 ENCOUNTER — Other Ambulatory Visit: Payer: Self-pay | Admitting: Nurse Practitioner

## 2021-03-01 DIAGNOSIS — F3341 Major depressive disorder, recurrent, in partial remission: Secondary | ICD-10-CM

## 2021-03-01 DIAGNOSIS — F411 Generalized anxiety disorder: Secondary | ICD-10-CM

## 2021-03-03 NOTE — Telephone Encounter (Signed)
Refilled for single 30 day period. Patient has upcoming appointment 03/18/2021 and will give refills at that time.

## 2021-03-12 ENCOUNTER — Encounter: Payer: Self-pay | Admitting: Physician Assistant

## 2021-03-12 ENCOUNTER — Ambulatory Visit (INDEPENDENT_AMBULATORY_CARE_PROVIDER_SITE_OTHER): Payer: BC Managed Care – PPO | Admitting: Physician Assistant

## 2021-03-12 VITALS — Ht 64.0 in | Wt 218.0 lb

## 2021-03-12 DIAGNOSIS — J019 Acute sinusitis, unspecified: Secondary | ICD-10-CM

## 2021-03-12 DIAGNOSIS — R051 Acute cough: Secondary | ICD-10-CM | POA: Diagnosis not present

## 2021-03-12 MED ORDER — HYDROCOD POLST-CPM POLST ER 10-8 MG/5ML PO SUER: 5.0000 mL | Freq: Every evening | ORAL | 0 refills | Status: DC | PRN

## 2021-03-12 MED ORDER — LEVOFLOXACIN 500 MG PO TABS: 500.0000 mg | ORAL_TABLET | Freq: Every day | ORAL | 0 refills | Status: AC

## 2021-03-12 MED ORDER — METHYLPREDNISOLONE 4 MG PO TBPK: ORAL_TABLET | ORAL | 0 refills | Status: DC

## 2021-03-12 NOTE — Progress Notes (Signed)
Telehealth office visit note for Wendy Reid, PA-C- at Primary Care at [facility]   I connected with current patient today by telephone and verified that I am speaking with the correct person    Location of the patient: Home  Location of the provider: Office - This visit type was conducted due to national recommendations for restrictions regarding the COVID-19 Pandemic (e.g. social distancing) in an effort to limit this patient's exposure and mitigate transmission in our community.    - No physical exam could be performed with this format, beyond that communicated to [place] by the patient/ family members as noted.   - Additionally my office staff/ schedulers were to discuss with the patient that there may be a monetary charge related to this service, depending on their medical insurance.  My understanding is that patient understood and consented to proceed.     _________________________________________________________________________________   History of Present Illness: Patient calls in with c/o headache, sinus pressure, cough with green-brown sputum, nasal congestion with green drainage, some shortness of breath and wheezing.  Patient reports her symptoms started last Friday (5 days ago).  States has not tried anything for her symptoms because she was getting better but woke up this morning feeling worse.  Denies fever, chills, nausea, vomiting or diarrhea.  Did an at-home COVID test this morning which resulted negative.     GAD 7 : Generalized Anxiety Score 01/09/2021 11/21/2020 10/11/2020  Nervous, Anxious, on Edge 0 1 0  Control/stop worrying 1 1 0  Worry too much - different things 1 1 0  Trouble relaxing 1 1 0  Restless 1 1 0  Easily annoyed or irritable 1 1 0  Afraid - awful might happen 0 1 0  Total GAD 7 Score 5 7 0    Depression screen [facility] 2/9 01/09/2021 11/21/2020 10/11/2020 08/28/2020 07/24/2020  Decreased Interest 1 0 0 0 0  Down, Depressed, Hopeless 1 1 0 1 1  PHQ - 2  Score 2 1 0 1 1  Altered sleeping 1 1 2  0 1  Tired, decreased energy 1 2 1 1 1   Change in appetite 1 2 0 2 0  Feeling bad or failure about yourself  0 0 0 0 0  Trouble concentrating 0 0 0 1 1  Moving slowly or fidgety/restless 0 0 0 0 0  Suicidal thoughts 0 0 0 0 0  PHQ-9 Score 5 6 3 5 4   Some recent data might be hidden      Impression and Recommendations:     1. Acute non-recurrent sinusitis, unspecified location   2. Acute cough     Signs and symptoms suggestive of sinusitis and due to severity and worsening symptoms will start oral antibiotic therapy.  Patient has several medication allergies to antibiotics and reports in the past levofloxacin has worked fine without issues, denies any antibiotic use within the past 3 months.  Will start levofloxacin 500 mg once daily for 7 days and corticosteroid therapy.  Recommend to use albuterol inhaler.  Patient reports Tessalon Perles have been ineffective in the past, recommend to use Tussionex at nighttime.   - As part of my medical decision making, I reviewed the following data within the [place] History obtained from pt /family, CMA notes reviewed and incorporated if applicable, Labs reviewed, Radiograph/ tests reviewed if applicable and OV notes from prior OV's with me, as well as any other specialists she/he has seen since seeing me  last, were all reviewed and used in my medical decision making process today.    - Additionally, when appropriate, discussion had with patient regarding our treatment plan, and their biases/concerns about that plan were used in my medical decision making today.    - The patient agreed with the plan and demonstrated an understanding of the instructions.   No barriers to understanding were identified.     - The patient was advised to call back or seek an in-person evaluation if the symptoms worsen or if the condition fails to improve as anticipated.   Return if symptoms worsen or fail to  improve.    No orders of the defined types were placed in this encounter.   Meds ordered this encounter  Medications   levofloxacin (LEVAQUIN) 500 MG tablet    Sig: Take 1 tablet (500 mg total) by mouth daily for 7 days.    Dispense:  7 tablet    Refill:  0    Order Specific Question:   Supervising Provider    Answer:   Beatrice Lecher D [[id number]]   methylPREDNISolone (MEDROL DOSEPAK) 4 MG TBPK tablet    Sig: Take as directed on package.    Dispense:  21 tablet    Refill:  0    Order Specific Question:   Supervising Provider    Answer:   Beatrice Lecher D [[id number]]   chlorpheniramine-HYDROcodone (TUSSIONEX PENNKINETIC ER) 10-8 MG/5ML SUER    Sig: Take 5 mLs by mouth at bedtime as needed for cough.    Dispense:  70 mL    Refill:  0    Order Specific Question:   Supervising Provider    Answer:   Beatrice Lecher D [[id number]]    There are no discontinued medications.     Time spent on telephone encounter was 8 minutes.  Note:  This note was prepared with assistance of Dragon voice recognition software. Occasional wrong-word or sound-a-like substitutions may have occurred due to the inherent limitations of voice recognition software.     The [place] was signed into law in 2016 which includes the topic of electronic health records.  This provides immediate access to information in MyChart.  This includes consultation notes, operative notes, office notes, lab results and pathology reports.  If you have any questions about what you read please let us know at your next visit or call us at the office.  We are right here with you.   __________________________________________________________________________________     Patient Care Team    Relationship Specialty Notifications Start End  Ronnell Freshwater, NP PCP - General Family Medicine  07/24/20      -Vitals obtained; medications/ allergies reconciled;  personal medical, social, Sx etc.histories were updated  by CMA, reviewed by me and are reflected in chart   Patient Active Problem List   Diagnosis Date Noted   Bacterial conjunctivitis of both eyes 01/20/2021   Routine general medical examination at a health care facility 01/09/2021   Other intervertebral disc degeneration, lumbar region 01/09/2021   Spondylolisthesis of lumbar region 01/09/2021   Body mass index (BMI) of 37.0-37.9 in adult 11/21/2020   Acute non-recurrent pansinusitis 11/05/2020   Wheezing 11/05/2020   COVID-19 10/20/2020   Noninfectious diarrhea    Polyp of ascending colon    Prediabetes 08/28/2020   Recurrent major depressive disorder, in partial remission ([place]) 08/28/2020   Palpitations 07/29/2020   Encounter to establish care 07/29/2020   Restless legs syndrome 05/30/2020   Intermittent  claudication ([place]) 05/08/2020   Aortic atherosclerosis ([place]) 05/08/2020   Functional diarrhea 03/07/2020   Irritable bowel syndrome with diarrhea 03/07/2020   Rhomboid muscle pain 10/25/2019   Vasomotor rhinitis 05/25/2019   Urinary tract infection without hematuria 02/27/2019   Close exposure to COVID-19 virus 02/13/2019   Generalized anxiety disorder 07/27/2018   Uncontrolled type 2 diabetes mellitus with hyperglycemia ([place]) 07/27/2018   Vitamin D deficiency 07/27/2018   Acute upper respiratory infection 02/28/2018   Cough 02/28/2018   Sore throat 02/28/2018   Seasonal allergic rhinitis due to pollen 02/28/2018   Migraine without aura and without status migrainosus, not intractable 12/21/2017   Essential hypertension 12/21/2017   Pain in both feet 08/21/2017   Peripheral neuropathic pain 08/21/2017   Urinary tract infection with hematuria 08/21/2017   Dysuria 08/21/2017   Screening for breast cancer 08/21/2017   Status post bariatric surgery 01/05/2017   Benign essential HTN 12/05/2015   Mixed hyperlipidemia 12/05/2015   Shortness of breath 12/05/2015   Cobalamin deficiency 11/18/2015   Gastroesophageal reflux disease  10/22/2015   Morbid obesity ([place]) 10/22/2015   Obstructive sleep apnea syndrome 10/22/2015   Diabetes mellitus ([place]) 10/22/2015   Primary osteoarthritis of left knee 10/04/2014   Left knee pain 09/06/2014     Current Meds  Medication Sig   chlorpheniramine-HYDROcodone (TUSSIONEX PENNKINETIC ER) 10-8 MG/5ML SUER Take 5 mLs by mouth at bedtime as needed for cough.   levofloxacin (LEVAQUIN) 500 MG tablet Take 1 tablet (500 mg total) by mouth daily for 7 days.   methylPREDNISolone (MEDROL DOSEPAK) 4 MG TBPK tablet Take as directed on package.     Allergies:  Allergies  Allergen Reactions   Flagyl [Metronidazole] Anaphylaxis and Rash   Other    Penicillins Anaphylaxis and Other (See Comments)    Has patient had a PCN reaction causing immediate rash, facial/tongue/throat swelling, SOB or lightheadedness with hypotension: Yes Has patient had a PCN reaction causing severe rash involving mucus membranes or skin necrosis: No Has patient had a PCN reaction that required hospitalization No Has patient had a PCN reaction occurring within the last 10 years: No If all of the above answers are "NO", then may proceed with Cephalosporin use.   Doxycycline Hives     ROS:  See above HPI for pertinent positives and negatives   Objective:   Height 5\' 4"  (1.626 m), weight 218 lb (98.9 kg).  (if some vitals are omitted, this means that patient was UNABLE to obtain them.) General: A & O * 3; sounds in no acute distress, dry cough noted on call Respiratory: speaking in full sentences, no conversational dyspnea Psych: insight appears good, mood- appears appropriate

## 2021-03-17 ENCOUNTER — Other Ambulatory Visit: Payer: Self-pay | Admitting: Nurse Practitioner

## 2021-03-17 ENCOUNTER — Telehealth: Payer: Self-pay | Admitting: Nurse Practitioner

## 2021-03-17 DIAGNOSIS — Z6837 Body mass index (BMI) 37.0-37.9, adult: Secondary | ICD-10-CM

## 2021-03-17 DIAGNOSIS — R7301 Impaired fasting glucose: Secondary | ICD-10-CM

## 2021-03-17 MED ORDER — SEMAGLUTIDE (1 MG/DOSE) 4 MG/3ML ~~LOC~~ SOPN: 2.0000 mg | PEN_INJECTOR | SUBCUTANEOUS | 3 refills | Status: DC

## 2021-03-17 NOTE — Telephone Encounter (Signed)
Patient states she is unable to get Ozempic 2mg  from mail order pharmacy. She said at a local pharmacy it would be $400 for that dose. Pt is requesting we send rx of lower dose where she can take 2mg . Patient states she was supposed to have the injection today and has the shakes and thinks its bc of not using the Ozempic.   Advised patient to check blood sugar. She is not close to her glucometer at this time but plans to check once she gets back home.    [facility]    AS, CMA

## 2021-03-17 NOTE — Telephone Encounter (Signed)
I don't think I can write it as 2mg  with a 1mg  pen. I did send it to [facility] like this, but I think it will get kicked back.  I don't think shakes would be from not having this prescription, as blood sugars would go up and not down. And it's not habit forming, so there would be no withdrawal type symptoms.

## 2021-03-17 NOTE — Telephone Encounter (Signed)
Called pt she stated that [facility] cost to much and other pharmacy not in stock at this time pt  said she will keep monitoring  her blood sugar

## 2021-03-18 ENCOUNTER — Encounter: Payer: Self-pay | Admitting: Nurse Practitioner

## 2021-03-18 ENCOUNTER — Ambulatory Visit: Payer: BC Managed Care – PPO | Admitting: Nurse Practitioner

## 2021-03-20 ENCOUNTER — Ambulatory Visit: Payer: BC Managed Care – PPO | Admitting: Nurse Practitioner

## 2021-03-22 ENCOUNTER — Other Ambulatory Visit: Payer: Self-pay | Admitting: Nurse Practitioner

## 2021-03-22 DIAGNOSIS — R002 Palpitations: Secondary | ICD-10-CM

## 2021-03-22 DIAGNOSIS — I10 Essential (primary) hypertension: Secondary | ICD-10-CM

## 2021-03-24 ENCOUNTER — Encounter: Payer: Self-pay | Admitting: Nurse Practitioner

## 2021-03-24 ENCOUNTER — Ambulatory Visit (INDEPENDENT_AMBULATORY_CARE_PROVIDER_SITE_OTHER): Payer: BC Managed Care – PPO | Admitting: Nurse Practitioner

## 2021-03-24 ENCOUNTER — Other Ambulatory Visit: Payer: Self-pay

## 2021-03-24 VITALS — BP 120/72 | HR 114 | Temp 98.2°F | Ht 64.0 in | Wt 223.4 lb

## 2021-03-24 DIAGNOSIS — Z23 Encounter for immunization: Secondary | ICD-10-CM

## 2021-03-24 DIAGNOSIS — M25512 Pain in left shoulder: Secondary | ICD-10-CM

## 2021-03-24 DIAGNOSIS — I880 Nonspecific mesenteric lymphadenitis: Secondary | ICD-10-CM | POA: Diagnosis not present

## 2021-03-24 DIAGNOSIS — Z6838 Body mass index (BMI) 38.0-38.9, adult: Secondary | ICD-10-CM

## 2021-03-24 DIAGNOSIS — R1084 Generalized abdominal pain: Secondary | ICD-10-CM | POA: Diagnosis not present

## 2021-03-24 DIAGNOSIS — G8929 Other chronic pain: Secondary | ICD-10-CM

## 2021-03-24 DIAGNOSIS — M792 Neuralgia and neuritis, unspecified: Secondary | ICD-10-CM

## 2021-03-24 DIAGNOSIS — R7301 Impaired fasting glucose: Secondary | ICD-10-CM

## 2021-03-24 MED ORDER — HYDROCODONE-ACETAMINOPHEN 10-325 MG PO TABS: 1.0000 | ORAL_TABLET | Freq: Two times a day (BID) | ORAL | 0 refills | Status: DC | PRN

## 2021-03-24 MED ORDER — RYBELSUS 3 MG PO TABS: 3.0000 mg | ORAL_TABLET | Freq: Every day | ORAL | 1 refills | Status: DC

## 2021-03-24 NOTE — Progress Notes (Signed)
Acute Office Visit  Subjective:    Patient ID: Wendy Hubbard, female    DOB: 26-Dec-1955, 65 y.o.   MRN: [id number]  Chief Complaint  Patient presents with   Abdominal Pain    The patient states she has left shoulder pain.  This is chronic in nature.  This is interfering with her ability to lift her granddaughter.  She reports reduced range of motion and reduced strength due to the left shoulder pain.  An MRI of her left shoulder was done in August, 2021.  It did show the following: Rotator cuff and intra-articular Reddix head of biceps tendinopathy without tear. Moderate acromioclavicular osteoarthritis. Type 2 acromion with a small subacromial spur also noted. Subacromial/subdeltoid bursitis.  Mild appearing glenohumeral degenerative disease with associated fraying of the superior labrum.  Abdominal Pain This is a chronic problem. The current episode started more than 1 month ago. The onset quality is undetermined. The problem occurs intermittently. The problem has been waxing and waning. The pain is located in the RLQ and LUQ. The quality of the pain is aching and cramping. Associated symptoms include arthralgias, diarrhea and myalgias. Pertinent negatives include no anorexia, belching, constipation, dysuria, fever, flatus, frequency, headaches, hematochezia, hematuria, nausea or vomiting. Nothing aggravates the pain. The pain is relieved by Nothing. Treatments tried: heating pad makes pain better. Prior diagnostic workup includes CT scan (CT scan last year showing mesenteric adenitis and panniculitis. This is follow up due to increased abdominal pain). Her past medical history is significant for abdominal surgery, gallstones and irritable bowel syndrome.    Past Medical History:  Diagnosis Date   Allergy    Phreesia 07/23/2020   Anxiety    Phreesia 07/23/2020   COPD (chronic obstructive pulmonary disease) (HCC)    DDD (degenerative disc disease), thoracic    Depression    Phreesia  07/23/2020   Diabetes mellitus without complication ([place])    Phreesia 07/23/2020   GERD (gastroesophageal reflux disease)    Hyperlipidemia    Hypertension    Rapid heart rate    Sleep apnea    Wears contact lenses     Past Surgical History:  Procedure Laterality Date   ABDOMINAL HYSTERECTOMY     ANKLE ARTHROSCOPY Bilateral    APPENDECTOMY     BLADDER SUSPENSION     BREAST EXCISIONAL BIOPSY Left 1990   neg   BREAST EXCISIONAL BIOPSY Right 1989   neg   BREAST SURGERY     CHOLECYSTECTOMY     COLONOSCOPY N/A 11/04/2015   Procedure: COLONOSCOPY;  Surgeon: Manya Silvas, MD;  Location: [facility];  Service: Endoscopy;  Laterality: N/A;   COLONOSCOPY WITH PROPOFOL N/A 08/30/2020   Procedure: COLONOSCOPY WITH PROPOFOL;  Surgeon: Lucilla Lame, MD;  Location: [facility] [place];  Service: Endoscopy;  Laterality: N/A;   ESOPHAGOGASTRODUODENOSCOPY N/A 11/02/2015   Procedure: ESOPHAGOGASTRODUODENOSCOPY (EGD);  Surgeon: Manya Silvas, MD;  Location[facility];  Service: Endoscopy;  Laterality: N/A;   KNEE ARTHROSCOPY Left    POLYPECTOMY  08/30/2020   Procedure: POLYPECTOMY;  Surgeon: Lucilla Lame, MD;  Location: [facility];  Service: Endoscopy;;   REDUCTION MAMMAPLASTY Bilateral 1988   TONSILLECTOMY      Family History  Problem Relation Age of Onset   Hypertension Other    Bladder Cancer Mother    High Cholesterol Mother    High blood pressure Mother    Cancer Mother    Heart attack Father    High blood pressure Father  Kidney cancer Neg Hx    Prostate cancer Neg Hx    Breast cancer Neg Hx     Social History   Socioeconomic History   Marital status: Married    Spouse name: Not on file   Number of children: Not on file   Years of education: Not on file   Highest education level: Not on file  Occupational History   Not on file  Tobacco Use   Smoking status: Former   Smokeless tobacco: Never   Tobacco comments:    quit in 1996  Vaping Use    Vaping Use: Never used  Substance and Sexual Activity   Alcohol use: Yes    Alcohol/week: 2.0 standard drinks    Types: 1 Glasses of wine, 1 Standard drinks or equivalent per week    Comment: occasional   Drug use: No   Sexual activity: Yes    Partners: Male  Other Topics Concern   Not on file  Social History Narrative   Not on file   Social Determinants of Health   Financial Resource Strain: Not on file  Food Insecurity: Not on file  Transportation Needs: Not on file  Physical Activity: Not on file  Stress: Not on file  Social Connections: Not on file  Intimate Partner Violence: Not on file    Outpatient Medications Prior to Visit  Medication Sig Dispense Refill   alosetron (LOTRONEX) 1 MG tablet Take 1 tablet (1 mg total) by mouth 2 (two) times daily. 60 tablet 5   ALPRAZolam (XANAX) 0.5 MG tablet TAKE 1/2 TO 1 TABLET BY MOUTH ONCE DAILYAS NEEDED FOR ACUTE ANXIETY 30 tablet 0   butalbital-acetaminophen-caffeine (FIORICET) 50-325-40 MG tablet Take 1-2 tablets by mouth every 6 (six) hours as needed for headache. 30 tablet 2   chlorpheniramine-HYDROcodone (TUSSIONEX PENNKINETIC ER) 10-8 MG/5ML SUER Take 5 mLs by mouth at bedtime as needed for cough. 70 mL 0   cilostazol (PLETAL) 100 MG tablet Take 1 tablet (100 mg total) by mouth 2 (two) times daily. 180 tablet 0   diphenoxylate-atropine (LOMOTIL) 2.5-0.025 MG tablet Take 1 tablet by mouth 4 (four) times daily as needed for diarrhea or loose stools. 30 tablet 1   escitalopram (LEXAPRO) 10 MG tablet Take 1 tablet (10 mg total) by mouth daily. 180 tablet 0   furosemide (LASIX) 20 MG tablet Take 1 tablet (20 mg total) by mouth daily. Take once a day as needed edema 90 tablet 0   gabapentin (NEURONTIN) 600 MG tablet Take one AM, one after lunch, and 2 QHS 360 tablet 3   Insulin Pen Needle (PEN NEEDLES 3/16") 31G X 5 MM MISC To use daily with saxenda perscriptoin 90 each 3   loratadine (CLARITIN) 10 MG tablet Take 10 mg by mouth daily  as needed for allergies.     metoprolol succinate (TOPROL-XL) 50 MG 24 hr tablet TAKE 1 and 1/2 TABLETS BY MOUTH AT BEDTIME 135 tablet 1   mirtazapine (REMERON) 15 MG tablet TAKE 1 TABLET BY MOUTH AT BEDTIME FOR SLEEP 90 tablet 1   montelukast (SINGULAIR) 10 MG tablet TAKE 1 TABLET BY MOUTH AT BEDTIME 90 tablet 0   Multiple Vitamins-Minerals (BARIATRIC MULTIVITAMINS/IRON PO) Take by mouth.     ondansetron (ZOFRAN-ODT) 4 MG disintegrating tablet Take 1 to 2 tablets po TID prn nausea 45 tablet 1   pantoprazole (PROTONIX) 40 MG tablet TAKE 1 TABLET BY MOUTH ONCE DAILY 90 tablet 1   Probiotic Product (PROBIOTIC PO) Take by mouth.  rOPINIRole (REQUIP) 0.5 MG tablet TAKE 1 TABLET BY MOUTH AT BEDTIME MAY INCREASE TO 2 TABS AT BEDTIME AS NEEDED TOLERATED 180 tablet 1   rosuvastatin (CRESTOR) 10 MG tablet TAKE 1 TABLET BY MOUTH ONCE DAILY 90 tablet 1   Sod Picosulfate-Mag Ox-Cit Acd (CLENPIQ) 10-3.5-12 MG-GM -GM/160ML SOLN Take 320 mLs by mouth as directed. 320 mL 0   tiZANidine (ZANAFLEX) 4 MG tablet Take 1 tablet po BID prn muscle pain/spasms. (Patient taking differently: Take 4 mg by mouth every 6 (six) hours as needed.) 45 tablet 3   trimethoprim-polymyxin b (POLYTRIM) ophthalmic solution Place 1 drop into the right eye every 4 (four) hours. 10 mL 0   HYDROcodone-acetaminophen (NORCO) 10-325 MG tablet Take 1 tablet by mouth 2 (two) times daily as needed. 45 tablet 0   Semaglutide, 1 MG/DOSE, 4 MG/3ML SOPN Inject 2 mg as directed once a week. 6 mL 3   methylPREDNISolone (MEDROL DOSEPAK) 4 MG TBPK tablet Take as directed on package. 21 tablet 0   No facility-administered medications prior to visit.    Allergies  Allergen Reactions   Flagyl [Metronidazole] Anaphylaxis and Rash   Other    Penicillins Anaphylaxis and Other (See Comments)    Has patient had a PCN reaction causing immediate rash, facial/tongue/throat swelling, SOB or lightheadedness with hypotension: Yes Has patient had a PCN  reaction causing severe rash involving mucus membranes or skin necrosis: No Has patient had a PCN reaction that required hospitalization No Has patient had a PCN reaction occurring within the last 10 years: No If all of the above answers are "NO", then may proceed with Cephalosporin use.   Doxycycline Hives    Review of Systems  Constitutional:  Positive for fatigue. Negative for activity change, appetite change, chills and fever.  HENT:  Negative for congestion, postnasal drip, rhinorrhea, sinus pressure, sinus pain, sneezing and sore throat.   Eyes: Negative.   Respiratory:  Negative for cough, chest tightness, shortness of breath and wheezing.   Cardiovascular:  Negative for chest pain and palpitations.  Gastrointestinal:  Positive for abdominal pain and diarrhea. Negative for anorexia, blood in stool, constipation, flatus, hematochezia, nausea and vomiting.  Endocrine: Negative for cold intolerance, heat intolerance, polydipsia and polyuria.       Impaired fasting glucose.  Genitourinary:  Negative for dyspareunia, dysuria, flank pain, frequency, hematuria and urgency.  Musculoskeletal:  Positive for arthralgias and myalgias. Negative for back pain.       Severe pain in both feet associated with neuropathy.  She does take hydrocodone at night as this is the only medication that has worked to control pain in her feet. Chronic left shoulder pain.  She reports decreased range of motion and strength due to the pain.  Skin:  Negative for rash.  Allergic/Immunologic: Positive for environmental allergies.  Neurological:  Negative for dizziness, weakness and headaches.  Hematological:  Negative for adenopathy.  Psychiatric/Behavioral:  The patient is nervous/anxious.       Objective:    Physical Exam Vitals and nursing note reviewed.  Constitutional:      Appearance: Normal appearance. She is well-developed. She is obese.  HENT:     Head: Normocephalic and atraumatic.  Eyes:      Pupils: Pupils are equal, round, and reactive to light.  Cardiovascular:     Rate and Rhythm: Normal rate and regular rhythm.     Pulses: Normal pulses.     Heart sounds: Normal heart sounds.  Pulmonary:     Effort:  Pulmonary effort is normal.     Breath sounds: Normal breath sounds.  Abdominal:     General: Bowel sounds are normal.     Palpations: Abdomen is soft.     Tenderness: There is abdominal tenderness.     Comments: Generalized abdominal tenderness with palpation.  Musculoskeletal:        General: Normal range of motion.     Cervical back: Normal range of motion and neck supple.     Comments: Left shoulder pain with palpation.  Mildly reduced strength and range of motion.  No bony abnormalities or deformities are palpated at this time.  Lymphadenopathy:     Cervical: No cervical adenopathy.  Skin:    General: Skin is warm and dry.     Capillary Refill: Capillary refill takes less than 2 seconds.  Neurological:     General: No focal deficit present.     Mental Status: She is alert and oriented to person, place, and time.  Psychiatric:        Mood and Affect: Mood normal.        Behavior: Behavior normal.        Thought Content: Thought content normal.        Judgment: Judgment normal.    Today's Vitals   03/24/21 1359  BP: 120/72  Pulse: (!) 114  Temp: 98.2 F (36.8 C)  SpO2: 95%  Weight: 223 lb 6.4 oz (101.3 kg)  Height: _0  (1.626 m)   Body mass index is 38.35 kg/m.   Wt Readings from Last 3 Encounters:  03/24/21 223 lb 6.4 oz (101.3 kg)  03/12/21 218 lb (98.9 kg)  01/14/21 218 lb 4.8 oz (99 kg)    Health Maintenance Due  Topic Date Due   Pneumonia Vaccine 14+ Years old (1 - PCV) Never done   TETANUS/TDAP  Never done   COVID-19 Vaccine (3 - Booster for Pfizer series) 10/24/2019   FOOT EXAM  01/14/2021    There are no preventive care reminders to display for this patient.   Lab Results  Component Value Date   TSH 2.080 08/07/2020   Lab  Results  Component Value Date   WBC 6.3 10/01/2020   HGB 13.6 10/01/2020   HCT 42.4 10/01/2020   MCV 97.0 10/01/2020   PLT 262 10/01/2020   Lab Results  Component Value Date   NA 138 10/01/2020   K 4.1 10/01/2020   CO2 25 10/01/2020   GLUCOSE 127 (H) 10/01/2020   BUN 10 10/01/2020   CREATININE 0.96 10/01/2020   BILITOT 0.6 10/01/2020   ALKPHOS 99 10/01/2020   AST 27 10/01/2020   ALT 15 10/01/2020   PROT 7.1 10/01/2020   ALBUMIN 3.9 10/01/2020   CALCIUM 9.0 10/01/2020   ANIONGAP 11 10/01/2020   EGFR 73 08/07/2020   Lab Results  Component Value Date   CHOL 174 08/07/2020   Lab Results  Component Value Date   HDL 57 08/07/2020   Lab Results  Component Value Date   LDLCALC 75 08/07/2020   Lab Results  Component Value Date   TRIG 259 (H) 08/07/2020   Lab Results  Component Value Date   CHOLHDL 3.1 08/07/2020   Lab Results  Component Value Date   HGBA1C 6.1 (H) 08/07/2020       Assessment & Plan:  1. Generalized abdominal pain Patient with generalized abdominal pain with palpation.  Has been a problem in the past.  CT scan of abdomen pelvis done in October 2021  did show generalized mesenteric adenitis.  Potentially a reoccurring issue.  We will get new CT scan of abdomen and pelvis for further evaluation. - CT Abdomen Pelvis W Contrast; Future  2. Mesenteric adenitis CT of abdomen and pelvis done in October, 2021, showed mesenteric adenitis.  This may be causing pain at this time.  We will get new CT of the abdomen and pelvis for further evaluation.  Refer to specialist as indicated. - CT Abdomen Pelvis W Contrast; Future  3. Chronic left shoulder pain MRI done in August, 2021, did show rotator cuff and biceps tendinopathy.  There is AC joint bursitis and degenerative osteoarthritis present on MRI.  Refer to orthopedics for further evaluation and treatment. - Ambulatory referral to Orthopedic Surgery  4. Body mass index (BMI) of 38.0-38.9 in adult Trial  Rybelsus 3 mg daily. Discussed lowering calorie intake to 1500 calories per day and incorporating exercise into daily routine to help lose weight.  Reassess in 1 month.  Consider increasing dose of Rybelsus to 7 mg at that time. - Semaglutide (RYBELSUS) 3 MG TABS; Take 3 mg by mouth daily.  Dispense: 30 tablet; Refill: 1  5. Impaired fasting glucose Trial Rybelsus 3 mg daily.  Recheck hemoglobin A1c at next visit. - Semaglutide (RYBELSUS) 3 MG TABS; Take 3 mg by mouth daily.  Dispense: 30 tablet; Refill: 1  6. Peripheral neuropathic pain Patient may take hydrocodone/acetaminophen 10/325 mg tablets up to twice daily as needed for severe pain.  Prescription for number 45 tablets was sent to her pharmacy today. - HYDROcodone-acetaminophen (NORCO) 10-325 MG tablet; Take 1 tablet by mouth 2 (two) times daily as needed.  Dispense: 45 tablet; Refill: 0  7. Need for influenza vaccination Flu vaccine administered during today's visit. - Flu Vaccine QUAD High Dose(Fluad)   Problem List Items Addressed This Visit       Endocrine   Impaired fasting glucose   Relevant Medications   Semaglutide (RYBELSUS) 3 MG TABS     Immune and Lymphatic   Mesenteric adenitis   Relevant Orders   CT Abdomen Pelvis W Contrast     Other   Peripheral neuropathic pain   Relevant Medications   HYDROcodone-acetaminophen (NORCO) 10-325 MG tablet   Body mass index (BMI) of 38.0-38.9 in adult   Relevant Medications   Semaglutide (RYBELSUS) 3 MG TABS   Generalized abdominal pain - Primary   Relevant Orders   CT Abdomen Pelvis W Contrast   Chronic left shoulder pain   Relevant Medications   HYDROcodone-acetaminophen (NORCO) 10-325 MG tablet   Other Relevant Orders   Ambulatory referral to Orthopedic Surgery   Other Visit Diagnoses     Need for influenza vaccination       Relevant Orders   Flu Vaccine QUAD High Dose(Fluad) (Completed)        Meds ordered this encounter  Medications   Semaglutide  (RYBELSUS) 3 MG TABS    Sig: Take 3 mg by mouth daily.    Dispense:  30 tablet    Refill:  1    Order Specific Question:   Supervising Provider    Answer:   Silverio Decamp [[id number]]   HYDROcodone-acetaminophen (NORCO) 10-325 MG tablet    Sig: Take 1 tablet by mouth 2 (two) times daily as needed.    Dispense:  45 tablet    Refill:  0    This is not new prescription. This is ongoing treatment for chronic pain. Fill on or after 01/18/2021  Order Specific Question:   Supervising Provider    Answer:   Silverio Decamp [[id number]]     Ronnell Freshwater, NP  This note was dictated using Dragon Voice Recognition Software. Rapid proofreading was performed to expedite the delivery of the information. Despite proofreading, phonetic errors will occur which are common with this voice recognition software. Please take this into consideration. If there are any concerns, please contact our office.

## 2021-03-24 NOTE — Patient Instructions (Signed)
Fat and Cholesterol Restricted Eating Plan Getting too much fat and cholesterol in your diet may cause health problems. Choosing the right foods helps keep your fat and cholesterol at normal levels. This can keep you from getting certain diseases. Your doctor may recommend an eating plan that includes: Total fat: ______% or less of total calories a day. This is ______g of fat a day. Saturated fat: ______% or less of total calories a day. This is ______g of saturated fat a day. Cholesterol: less than _________mg a day. Fiber: ______g a day. What are tips for following this plan? General tips Work with your doctor to lose weight if you need to. Avoid: Foods with added sugar. Fried foods. Foods with trans fat or partially hydrogenated oils. This includes some margarines and baked goods. If you drink alcohol: Limit how much you have to: 0-1 drink a day for women who are not pregnant. 0-2 drinks a day for men. Know how much alcohol is in a drink. In the [place] one drink equals one 12 oz bottle of beer (355 mL), one 5 oz glass of wine (148 mL), or one 1 oz glass of hard liquor (44 mL). Reading food labels Check food labels for: Trans fats. Partially hydrogenated oils. Saturated fat (g) in each serving. Cholesterol (mg) in each serving. Fiber (g) in each serving. Choose foods with healthy fats, such as: Monounsaturated fats and polyunsaturated fats. These include olive and canola oil, flaxseeds, walnuts, almonds, and seeds. Omega-3 fats. These are found in certain fish, flaxseed oil, and ground flaxseeds. Choose grain products that have whole grains. Look for the word "whole" as the first word in the ingredient list. Cooking Cook foods using low-fat methods. These include baking, boiling, grilling, and broiling. Eat more home-cooked foods. Eat at restaurants and buffets less often. Eat less fast food. Avoid cooking using saturated fats, such as butter, cream, palm oil, palm kernel oil, and  coconut oil. Meal planning  At meals, divide your plate into four equal parts: Fill one-half of your plate with vegetables, green salads, and fruit. Fill one-fourth of your plate with whole grains. Fill one-fourth of your plate with low-fat (lean) protein foods. Eat fish that is high in omega-3 fats at least two times a week. This includes mackerel, tuna, sardines, and salmon. Eat foods that are high in fiber, such as whole grains, beans, apples, pears, berries, broccoli, carrots, peas, and barley. What foods should I eat? Fruits All fresh, canned (in natural juice), or frozen fruits. Vegetables Fresh or frozen vegetables (raw, steamed, roasted, or grilled). Green salads. Grains Whole grains, such as whole wheat or whole grain breads, crackers, cereals, and pasta. Unsweetened oatmeal, bulgur, barley, quinoa, or brown rice. Corn or whole wheat flour tortillas. Meats and other protein foods Ground beef (85% or leaner), grass-fed beef, or beef trimmed of fat. Skinless chicken or turkey. Ground chicken or turkey. Pork trimmed of fat. All fish and seafood. Egg whites. Dried beans, peas, or lentils. Unsalted nuts or seeds. Unsalted canned beans. Nut butters without added sugar or oil. Dairy Low-fat or nonfat dairy products, such as skim or 1% milk, 2% or reduced-fat cheeses, low-fat and fat-free ricotta or cottage cheese, or plain low-fat and nonfat yogurt. Fats and oils Tub margarine without trans fats. Light or reduced-fat mayonnaise and salad dressings. Avocado. Olive, canola, sesame, or safflower oils. The items listed above may not be a complete list of foods and beverages you can eat. Contact a dietitian for more information. What foods   should I avoid? Fruits Canned fruit in heavy syrup. Fruit in cream or butter sauce. Fried fruit. Vegetables Vegetables cooked in cheese, cream, or butter sauce. Fried vegetables. Grains White bread. White pasta. White rice. Cornbread. Bagels, pastries,  and croissants. Crackers and snack foods that contain trans fat and hydrogenated oils. Meats and other protein foods Fatty cuts of meat. Ribs, chicken wings, bacon, sausage, bologna, salami, chitterlings, fatback, hot dogs, bratwurst, and packaged lunch meats. Liver and organ meats. Whole eggs and egg yolks. Chicken and turkey with skin. Fried meat. Dairy Whole or 2% milk, cream, half-and-half, and cream cheese. Whole milk cheeses. Whole-fat or sweetened yogurt. Full-fat cheeses. Nondairy creamers and whipped toppings. Processed cheese, cheese spreads, and cheese curds. Fats and oils Butter, stick margarine, lard, shortening, ghee, or bacon fat. Coconut, palm kernel, and palm oils. Beverages Alcohol. Sugar-sweetened drinks such as sodas, lemonade, and fruit drinks. Sweets and desserts Corn syrup, sugars, honey, and molasses. Candy. Jam and jelly. Syrup. Sweetened cereals. Cookies, pies, cakes, donuts, muffins, and ice cream. The items listed above may not be a complete list of foods and beverages you should avoid. Contact a dietitian for more information. Summary Choosing the right foods helps keep your fat and cholesterol at normal levels. This can keep you from getting certain diseases. At meals, fill one-half of your plate with vegetables, green salads, and fruits. Eat high fiber foods, like whole grains, beans, apples, pears, berries, carrots, peas, and barley. Limit added sugar, saturated fats, alcohol, and fried foods. This information is not intended to replace advice given to you by your health care provider. Make sure you discuss any questions you have with your health care provider. Document Revised: 09/06/2020 Document Reviewed: 09/06/2020 Elsevier Patient Education  2022 [employer]vier Inc.  

## 2021-03-28 ENCOUNTER — Encounter: Payer: Self-pay | Admitting: Nurse Practitioner

## 2021-03-30 DIAGNOSIS — R7301 Impaired fasting glucose: Secondary | ICD-10-CM | POA: Insufficient documentation

## 2021-03-30 DIAGNOSIS — I880 Nonspecific mesenteric lymphadenitis: Secondary | ICD-10-CM | POA: Insufficient documentation

## 2021-03-30 DIAGNOSIS — G8929 Other chronic pain: Secondary | ICD-10-CM | POA: Insufficient documentation

## 2021-03-30 DIAGNOSIS — M25512 Pain in left shoulder: Secondary | ICD-10-CM | POA: Insufficient documentation

## 2021-03-30 DIAGNOSIS — R1084 Generalized abdominal pain: Secondary | ICD-10-CM | POA: Insufficient documentation

## 2021-03-31 ENCOUNTER — Other Ambulatory Visit: Payer: Self-pay | Admitting: Nurse Practitioner

## 2021-03-31 DIAGNOSIS — Z6838 Body mass index (BMI) 38.0-38.9, adult: Secondary | ICD-10-CM

## 2021-03-31 DIAGNOSIS — R7301 Impaired fasting glucose: Secondary | ICD-10-CM

## 2021-03-31 DIAGNOSIS — I739 Peripheral vascular disease, unspecified: Secondary | ICD-10-CM

## 2021-03-31 MED ORDER — CILOSTAZOL 100 MG PO TABS: 100.0000 mg | ORAL_TABLET | Freq: Two times a day (BID) | ORAL | 1 refills | Status: DC

## 2021-03-31 MED ORDER — RYBELSUS 3 MG PO TABS: 3.0000 mg | ORAL_TABLET | Freq: Every day | ORAL | 0 refills | Status: DC

## 2021-04-03 ENCOUNTER — Encounter: Payer: Self-pay | Admitting: Nurse Practitioner

## 2021-04-09 NOTE — Telephone Encounter (Signed)
I called [facility] (location for CT) and left message for them to return my call so we can schedule patients CT/or have them reach out to patient to schedule.   [facility] [phone]

## 2021-04-09 NOTE — Telephone Encounter (Signed)
Can we check to see about the scheduling of her CT? Please and thank you

## 2021-04-09 NOTE — Telephone Encounter (Signed)
Ok thank you 

## 2021-04-09 NOTE — Telephone Encounter (Signed)
Called pt LVM for pt to contact office

## 2021-04-21 ENCOUNTER — Encounter: Payer: Self-pay | Admitting: Nurse Practitioner

## 2021-04-21 ENCOUNTER — Other Ambulatory Visit: Payer: Self-pay

## 2021-04-21 ENCOUNTER — Ambulatory Visit (INDEPENDENT_AMBULATORY_CARE_PROVIDER_SITE_OTHER): Payer: BC Managed Care – PPO | Admitting: Nurse Practitioner

## 2021-04-21 VITALS — BP 126/77 | HR 70 | Temp 97.8°F | Ht 64.0 in | Wt 222.4 lb

## 2021-04-21 DIAGNOSIS — Z23 Encounter for immunization: Secondary | ICD-10-CM | POA: Diagnosis not present

## 2021-04-21 DIAGNOSIS — R7301 Impaired fasting glucose: Secondary | ICD-10-CM | POA: Diagnosis not present

## 2021-04-21 DIAGNOSIS — M792 Neuralgia and neuritis, unspecified: Secondary | ICD-10-CM | POA: Diagnosis not present

## 2021-04-21 DIAGNOSIS — G2581 Restless legs syndrome: Secondary | ICD-10-CM

## 2021-04-21 DIAGNOSIS — E119 Type 2 diabetes mellitus without complications: Secondary | ICD-10-CM | POA: Diagnosis not present

## 2021-04-21 DIAGNOSIS — Z6838 Body mass index (BMI) 38.0-38.9, adult: Secondary | ICD-10-CM | POA: Diagnosis not present

## 2021-04-21 LAB — POCT GLYCOSYLATED HEMOGLOBIN (HGB A1C): Hemoglobin A1C: 5.8 % — AB (ref 4.0–5.6)

## 2021-04-21 MED ORDER — HYDROCODONE-ACETAMINOPHEN 10-325 MG PO TABS: 1.0000 | ORAL_TABLET | Freq: Two times a day (BID) | ORAL | 0 refills | Status: DC | PRN

## 2021-04-21 MED ORDER — GABAPENTIN 600 MG PO TABS: ORAL_TABLET | ORAL | 3 refills | Status: DC

## 2021-04-21 MED ORDER — RYBELSUS 7 MG PO TABS: 7.0000 mg | ORAL_TABLET | Freq: Every day | ORAL | 1 refills | Status: DC

## 2021-04-21 NOTE — Progress Notes (Signed)
Established Patient Office Visit  Subjective:  Patient ID: MANDE AUVIL, female    DOB: [birth year]  Age: 65 y.o. MRN: [id number]  CC:  Chief Complaint  Patient presents with   Follow-up   Diabetes    HPI Wendy Hubbard presents for follow-up of blood sugars.  We will start her on Rybelsus 3 mg tablets daily.  Just not doing anything.  Would like to increase dose.  Has lost 1 pound.  Has no negative side effects noted from starting this medication.  Hemoglobin A1c is 5.9 today.  This is improved from 6.1 at last check. She has severe neuropathy in her feet.  This is happening day and night.  Higher dose of gabapentin and hydrocodone are not really effective.  She has seen a neurologist in the past for this.  She did have nerve study done and was told she had neuropathy.  No alternatives were explored with her regarding treatment. She is scheduled to have abdominal and pelvis CT on 04/30/2021.  Will see her back after this test to review and refer as indicated.   Indication for chronic opioid: chronic and severe peripheral neuropathy  Medication and dose: hydrocodone/APAP 10/374m tablets # pills per month: 60 Opioid Treatment Agreement signed (Y/N): yes - 01/20/2021 Opioid Treatment Agreement last reviewed with patient:  yes [facility] reviewed this encounter (include red flags):    yes and no red flags observed I have reviewed her PDMP profile today.  Her overdose or score is 240.  Last fill of hydrocodone APAP 10/325 mg tablets was 03/24/2021.  Past Medical History:  Diagnosis Date   Allergy    Phreesia 07/23/2020   Anxiety    [employer]reesia 07/23/2020   COPD (chronic obstructive pulmonary disease) (HCC)    DDD (degenerative disc disease), thoracic    Depression    Phreesia 07/23/2020   Diabetes mellitus without complication ([place]    Phreesia 07/23/2020   GERD (gastroesophageal reflux disease)    Hyperlipidemia    Hypertension    Rapid heart rate    Sleep apnea    Wears contact lenses      Past Surgical History:  Procedure Laterality Date   ABDOMINAL HYSTERECTOMY     ANKLE ARTHROSCOPY Bilateral    APPENDECTOMY     BLADDER SUSPENSION     BREAST EXCISIONAL BIOPSY Left 1990   neg   BREAST EXCISIONAL BIOPSY Right 1989   neg   BREAST SURGERY     CHOLECYSTECTOMY     COLONOSCOPY N/A 11/04/2015   Procedure: COLONOSCOPY;  Surgeon: RManya Silvas MD;  [facility]  Service: Endoscopy;  Laterality: N/A;   COLONOSCOPY WITH PROPOFOL N/A 08/30/2020   Procedure: COLONOSCOPY WITH PROPOFOL;  Surgeon: WLucilla Lame MD;  [facility]  Service: [place]oscopy;  Laterality: N/A;   ESOPHAGOGASTRODUODENOSCOPY N/A 11/02/2015   Procedure: ESOPHAGOGASTRODUODENOSCOPY (EGD);  Surgeon: RManya Silvas MD;  [facility]  Service: Endoscopy;  Laterality: N/A;   KNEE ARTHROSCOPY Left    POLYPECTOMY  08/30/2020   Procedure: POLYPECTOMY;  Surgeon: WLucilla Lame MD;  [facility];  Service: Endoscopy;;   REDUCTION MAMMAPLASTY Bilateral 1988   TONSILLECTOMY      Family History  Problem Relation Age of Onset   Hypertension Other    Bladder Cancer Mother    High Cholesterol Mother    High blood pressure Mother    Cancer Mother    Heart attack Father    High blood pressure Father  Kidney cancer Neg Hx    Prostate cancer Neg Hx    Breast cancer Neg Hx     Social History   Socioeconomic History   Marital status: Married    Spouse name: Not on file   Number of children: Not on file   Years of education: Not on file   Highest education level: Not on file  Occupational History   Not on file  Tobacco Use   Smoking status: Former   Smokeless tobacco: Never   Tobacco comments:    quit in 1996  Vaping Use   Vaping Use: Never used  Substance and Sexual Activity   Alcohol use: Yes    Alcohol/week: 2.0 standard drinks    Types: 1 Glasses of wine, 1 Standard drinks or equivalent per week    Comment: occasional   Drug use: No    Sexual activity: Yes    Partners: Male  Other Topics Concern   Not on file  Social History Narrative   Not on file   Social Determinants of Health   Financial Resource Strain: Not on file  Food Insecurity: Not on file  Transportation Needs: Not on file  Physical Activity: Not on file  Stress: Not on file  Social Connections: Not on file  Intimate Partner Violence: Not on file    Outpatient Medications Prior to Visit  Medication Sig Dispense Refill   alosetron (LOTRONEX) 1 MG tablet Take 1 tablet (1 mg total) by mouth 2 (two) times daily. 60 tablet 5   ALPRAZolam (XANAX) 0.5 MG tablet TAKE 1/2 TO 1 TABLET BY MOUTH ONCE DAILYAS NEEDED FOR ACUTE ANXIETY 30 tablet 0   butalbital-acetaminophen-caffeine (FIORICET) 50-325-40 MG tablet Take 1-2 tablets by mouth every 6 (six) hours as needed for headache. 30 tablet 2   cilostazol (PLETAL) 100 MG tablet Take 1 tablet (100 mg total) by mouth 2 (two) times daily. 180 tablet 1   diphenoxylate-atropine (LOMOTIL) 2.5-0.025 MG tablet Take 1 tablet by mouth 4 (four) times daily as needed for diarrhea or loose stools. 30 tablet 1   escitalopram (LEXAPRO) 10 MG tablet Take 1 tablet (10 mg total) by mouth daily. 180 tablet 0   furosemide (LASIX) 20 MG tablet Take 1 tablet (20 mg total) by mouth daily. Take once a day as needed edema 90 tablet 0   loratadine (CLARITIN) 10 MG tablet Take 10 mg by mouth daily as needed for allergies.     metoprolol succinate (TOPROL-XL) 50 MG 24 hr tablet TAKE 1 and 1/2 TABLETS BY MOUTH AT BEDTIME 135 tablet 1   mirtazapine (REMERON) 15 MG tablet TAKE 1 TABLET BY MOUTH AT BEDTIME FOR SLEEP 90 tablet 1   montelukast (SINGULAIR) 10 MG tablet TAKE 1 TABLET BY MOUTH AT BEDTIME 90 tablet 0   Multiple Vitamins-Minerals (BARIATRIC MULTIVITAMINS/IRON PO) Take by mouth.     ondansetron (ZOFRAN-ODT) 4 MG disintegrating tablet Take 1 to 2 tablets po TID prn nausea 45 tablet 1   pantoprazole (PROTONIX) 40 MG tablet TAKE 1 TABLET BY  MOUTH ONCE DAILY 90 tablet 1   Probiotic Product (PROBIOTIC PO) Take by mouth.     rOPINIRole (REQUIP) 0.5 MG tablet TAKE 1 TABLET BY MOUTH AT BEDTIME MAY INCREASE TO 2 TABS AT BEDTIME AS NEEDED TOLERATED 180 tablet 1   rosuvastatin (CRESTOR) 10 MG tablet TAKE 1 TABLET BY MOUTH ONCE DAILY 90 tablet 1   Sod Picosulfate-Mag Ox-Cit Acd (CLENPIQ) 10-3.5-12 MG-GM -GM/160ML SOLN Take 320 mLs by mouth as directed.  320 mL 0   tiZANidine (ZANAFLEX) 4 MG tablet Take 1 tablet po BID prn muscle pain/spasms. (Patient taking differently: Take 4 mg by mouth every 6 (six) hours as needed.) 45 tablet 3   chlorpheniramine-HYDROcodone (TUSSIONEX PENNKINETIC ER) 10-8 MG/5ML SUER Take 5 mLs by mouth at bedtime as needed for cough. 70 mL 0   gabapentin (NEURONTIN) 600 MG tablet Take one AM, one after lunch, and 2 QHS 360 tablet 3   HYDROcodone-acetaminophen (NORCO) 10-325 MG tablet Take 1 tablet by mouth 2 (two) times daily as needed. 45 tablet 0   Semaglutide (RYBELSUS) 3 MG TABS Take 3 mg by mouth daily. 90 tablet 0   trimethoprim-polymyxin b (POLYTRIM) ophthalmic solution Place 1 drop into the right eye every 4 (four) hours. 10 mL 0   No facility-administered medications prior to visit.    Allergies  Allergen Reactions   Flagyl [Metronidazole] Anaphylaxis and Rash   Other    Penicillins Anaphylaxis and Other (See Comments)    Has patient had a PCN reaction causing immediate rash, facial/tongue/throat swelling, SOB or lightheadedness with hypotension: Yes Has patient had a PCN reaction causing severe rash involving mucus membranes or skin necrosis: No Has patient had a PCN reaction that required hospitalization No Has patient had a PCN reaction occurring within the last 10 years: No If all of the above answers are "NO", then may proceed with Cephalosporin use.   Doxycycline Hives    ROS Review of Systems  Constitutional:  Negative for activity change, appetite change, chills, fatigue and fever.  HENT:   Negative for congestion, postnasal drip, rhinorrhea, sinus pressure, sinus pain, sneezing and sore throat.   Eyes: Negative.   Respiratory:  Negative for cough, chest tightness, shortness of breath and wheezing.   Cardiovascular:  Negative for chest pain and palpitations.  Gastrointestinal:  Positive for abdominal pain and diarrhea. Negative for constipation, nausea and vomiting.  Endocrine: Negative for cold intolerance, heat intolerance, polydipsia and polyuria.       Blood sugars doing well.  Hemoglobin A1c is 5.9 today.  Genitourinary:  Negative for dyspareunia, dysuria, flank pain, frequency and urgency.  Musculoskeletal:  Negative for arthralgias, back pain and myalgias.  Skin:  Negative for rash.  Allergic/Immunologic: Negative for environmental allergies.  Neurological:  Negative for dizziness, weakness and headaches.       Severe numbness and neuropathy in both feet and lower legs.  Hematological:  Negative for adenopathy.  Psychiatric/Behavioral:  Positive for dysphoric mood. The patient is nervous/anxious.      Objective:    Physical Exam Vitals and nursing note reviewed.  Constitutional:      Appearance: Normal appearance. She is well-developed. She is obese.  HENT:     Head: Normocephalic and atraumatic.     Nose: Nose normal.     Mouth/Throat:     Mouth: Mucous membranes are moist.  Eyes:     Extraocular Movements: Extraocular movements intact.     Conjunctiva/sclera: Conjunctivae normal.     Pupils: Pupils are equal, round, and reactive to light.  Cardiovascular:     Rate and Rhythm: Normal rate and regular rhythm.     Pulses: Normal pulses.     Heart sounds: Normal heart sounds.  Pulmonary:     Effort: Pulmonary effort is normal.     Breath sounds: Normal breath sounds.  Abdominal:     Palpations: Abdomen is soft.  Musculoskeletal:        General: Normal range of motion.  Cervical back: Normal range of motion and neck supple.  Lymphadenopathy:      Cervical: No cervical adenopathy.  Skin:    General: Skin is warm and dry.     Capillary Refill: Capillary refill takes less than 2 seconds.  Neurological:     General: No focal deficit present.     Mental Status: She is alert and oriented to person, place, and time.  Psychiatric:        Mood and Affect: Mood normal.        Behavior: Behavior normal.        Thought Content: Thought content normal.        Judgment: Judgment normal.    Today's Vitals   04/21/21 1020  BP: 126/77  Pulse: 70  Temp: 97.8 F (36.6 C)  SpO2: 97%  Weight: 222 lb 6.4 oz (100.9 kg)  Height: 5' 4"  (1.626 m)   Body mass index is 38.17 kg/m.   Wt Readings from Last 3 Encounters:  04/21/21 222 lb 6.4 oz (100.9 kg)  03/24/21 223 lb 6.4 oz (101.3 kg)  03/12/21 218 lb (98.9 kg)     Health Maintenance Due  Topic Date Due   Pneumonia Vaccine 50+ Years old (1 - PCV) Never done   TETANUS/TDAP  Never done   COVID-19 Vaccine (3 - Booster for Pfizer series) 10/24/2019   FOOT EXAM  01/14/2021    There are no preventive care reminders to display for this patient.  Lab Results  Component Value Date   TSH 2.080 08/07/2020   Lab Results  Component Value Date   WBC 6.3 10/01/2020   HGB 13.6 10/01/2020   HCT 42.4 10/01/2020   MCV 97.0 10/01/2020   PLT 262 10/01/2020   Lab Results  Component Value Date   NA 138 10/01/2020   K 4.1 10/01/2020   CO2 25 10/01/2020   GLUCOSE 127 (H) 10/01/2020   BUN 10 10/01/2020   CREATININE 0.96 10/01/2020   BILITOT 0.6 10/01/2020   ALKPHOS 99 10/01/2020   AST 27 10/01/2020   ALT 15 10/01/2020   PROT 7.1 10/01/2020   ALBUMIN 3.9 10/01/2020   CALCIUM 9.0 10/01/2020   ANIONGAP 11 10/01/2020   EGFR 73 08/07/2020   Lab Results  Component Value Date   CHOL 174 08/07/2020   Lab Results  Component Value Date   HDL 57 08/07/2020   Lab Results  Component Value Date   LDLCALC 75 08/07/2020   Lab Results  Component Value Date   TRIG 259 (H) 08/07/2020   Lab  Results  Component Value Date   CHOLHDL 3.1 08/07/2020   Lab Results  Component Value Date   HGBA1C 5.8 (A) 04/21/2021      Assessment & Plan:  1. Controlled type 2 diabetes mellitus without complication, without [place]-term current use of insulin (HCC) Hemoglobin A1c 5.9 today. - POCT glycosylated hemoglobin (Hb A1C)  2. Impaired fasting glucose Increase dose of Rybelsus to 7 mg tablets.  Advised her to take 7 mg for next 2 to 3 weeks.  May increase to 14 mg daily if tolerated.  Continue to monitor blood sugars daily.  The goal is to have fasting blood sugars between 70 and 110. - Semaglutide (RYBELSUS) 7 MG TABS; Take 7-14 mg by mouth daily.  Dispense: 180 tablet; Refill: 1  3. Body mass index (BMI) of 38.0-38.9 in adult Discussed lowering calorie intake to 1500 calories per day and incorporating exercise into daily routine to help lose weight. Will  monitor.   4. Peripheral neuropathic pain Patient should continue gabapentin as previously prescribed.  New prescription sent to mail pharmacy.  May take hydrocodone 10/325 mg tablets up to 3 times daily as needed for severe pain.  A single prescription for #45 tablets was sent to her pharmacy.  A new referral to neurology was made today for further evaluation and treatment of peripheral neuropathy. - gabapentin (NEURONTIN) 600 MG tablet; Take one AM, one after lunch, and 2 QHS  Dispense: 360 tablet; Refill: 3 - Ambulatory referral to Neurology - HYDROcodone-acetaminophen (NORCO) 10-325 MG tablet; Take 1 tablet by mouth 2 (two) times daily as needed.  Dispense: 45 tablet; Refill: 0  5. Restless legs syndrome Continue gabapentin.  Prescribed.  Refer to neurology for further evaluation. - Ambulatory referral to Neurology  6. Need for shingles vaccine First shingles vaccine administered during today's visit. - Varicella-zoster vaccine IM  Problem List Items Addressed This Visit       Endocrine   Controlled type 2 diabetes mellitus  without complication, without [place]-term current use of insulin (HCC) - Primary   Relevant Medications   Semaglutide (RYBELSUS) 7 MG TABS   Other Relevant Orders   POCT glycosylated hemoglobin (Hb A1C) (Completed)   Impaired fasting glucose   Relevant Medications   Semaglutide (RYBELSUS) 7 MG TABS     Other   Peripheral neuropathic pain   Relevant Medications   gabapentin (NEURONTIN) 600 MG tablet   HYDROcodone-acetaminophen (NORCO) 10-325 MG tablet   Other Relevant Orders   Ambulatory referral to Neurology   Restless legs syndrome   Relevant Orders   Ambulatory referral to Neurology   Body mass index (BMI) of 38.0-38.9 in adult   Other Visit Diagnoses     Need for shingles vaccine       Relevant Orders   Varicella-zoster vaccine IM (Completed)       Meds ordered this encounter  Medications   Semaglutide (RYBELSUS) 7 MG TABS    Sig: Take 7-14 mg by mouth daily.    Dispense:  180 tablet    Refill:  1    Order Specific Question:   Supervising Provider    Answer:   Beatrice Lecher D [[id number]]   gabapentin (NEURONTIN) 600 MG tablet    Sig: Take one AM, one after lunch, and 2 QHS    Dispense:  360 tablet    Refill:  3    Order Specific Question:   Supervising Provider    Answer:   Hali Marry [[id number]]   HYDROcodone-acetaminophen (NORCO) 10-325 MG tablet    Sig: Take 1 tablet by mouth 2 (two) times daily as needed.    Dispense:  45 tablet    Refill:  0    This is not new prescription. This is ongoing treatment for chronic pain. Fill on or after 01/18/2021    Order Specific Question:   Supervising Provider    Answer:   Beatrice Lecher D [[id number]]     Follow-up: Return in about 4 weeks (around 05/19/2021) for review CT. increased dose rybelsus.    Ronnell Freshwater, NP

## 2021-04-21 NOTE — Patient Instructions (Signed)
Fat and Cholesterol Restricted Eating Plan Getting too much fat and cholesterol in your diet may cause health problems. Choosing the right foods helps keep your fat and cholesterol at normal levels. This can keep you from getting certain diseases. Your doctor may recommend an eating plan that includes: Total fat: ______% or less of total calories a day. This is ______g of fat a day. Saturated fat: ______% or less of total calories a day. This is ______g of saturated fat a day. Cholesterol: less than _________mg a day. Fiber: ______g a day. What are tips for following this plan? General tips Work with your doctor to lose weight if you need to. Avoid: Foods with added sugar. Fried foods. Foods with trans fat or partially hydrogenated oils. This includes some margarines and baked goods. If you drink alcohol: Limit how much you have to: 0-1 drink a day for women who are not pregnant. 0-2 drinks a day for men. Know how much alcohol is in a drink. In the [place] one drink equals one 12 oz bottle of beer (355 mL), one 5 oz glass of wine (148 mL), or one 1 oz glass of hard liquor (44 mL). Reading food labels Check food labels for: Trans fats. Partially hydrogenated oils. Saturated fat (g) in each serving. Cholesterol (mg) in each serving. Fiber (g) in each serving. Choose foods with healthy fats, such as: Monounsaturated fats and polyunsaturated fats. These include olive and canola oil, flaxseeds, walnuts, almonds, and seeds. Omega-3 fats. These are found in certain fish, flaxseed oil, and ground flaxseeds. Choose grain products that have whole grains. Look for the word "whole" as the first word in the ingredient list. Cooking Cook foods using low-fat methods. These include baking, boiling, grilling, and broiling. Eat more home-cooked foods. Eat at restaurants and buffets less often. Eat less fast food. Avoid cooking using saturated fats, such as butter, cream, palm oil, palm kernel oil, and  coconut oil. Meal planning  At meals, divide your plate into four equal parts: Fill one-half of your plate with vegetables, green salads, and fruit. Fill one-fourth of your plate with whole grains. Fill one-fourth of your plate with low-fat (lean) protein foods. Eat fish that is high in omega-3 fats at least two times a week. This includes mackerel, tuna, sardines, and salmon. Eat foods that are high in fiber, such as whole grains, beans, apples, pears, berries, broccoli, carrots, peas, and barley. What foods should I eat? Fruits All fresh, canned (in natural juice), or frozen fruits. Vegetables Fresh or frozen vegetables (raw, steamed, roasted, or grilled). Green salads. Grains Whole grains, such as whole wheat or whole grain breads, crackers, cereals, and pasta. Unsweetened oatmeal, bulgur, barley, quinoa, or brown rice. Corn or whole wheat flour tortillas. Meats and other protein foods Ground beef (85% or leaner), grass-fed beef, or beef trimmed of fat. Skinless chicken or turkey. Ground chicken or turkey. Pork trimmed of fat. All fish and seafood. Egg whites. Dried beans, peas, or lentils. Unsalted nuts or seeds. Unsalted canned beans. Nut butters without added sugar or oil. Dairy Low-fat or nonfat dairy products, such as skim or 1% milk, 2% or reduced-fat cheeses, low-fat and fat-free ricotta or cottage cheese, or plain low-fat and nonfat yogurt. Fats and oils Tub margarine without trans fats. Light or reduced-fat mayonnaise and salad dressings. Avocado. Olive, canola, sesame, or safflower oils. The items listed above may not be a complete list of foods and beverages you can eat. Contact a dietitian for more information. What foods   should I avoid? Fruits Canned fruit in heavy syrup. Fruit in cream or butter sauce. Fried fruit. Vegetables Vegetables cooked in cheese, cream, or butter sauce. Fried vegetables. Grains White bread. White pasta. White rice. Cornbread. Bagels, pastries,  and croissants. Crackers and snack foods that contain trans fat and hydrogenated oils. Meats and other protein foods Fatty cuts of meat. Ribs, chicken wings, bacon, sausage, bologna, salami, chitterlings, fatback, hot dogs, bratwurst, and packaged lunch meats. Liver and organ meats. Whole eggs and egg yolks. Chicken and turkey with skin. Fried meat. Dairy Whole or 2% milk, cream, half-and-half, and cream cheese. Whole milk cheeses. Whole-fat or sweetened yogurt. Full-fat cheeses. Nondairy creamers and whipped toppings. Processed cheese, cheese spreads, and cheese curds. Fats and oils Butter, stick margarine, lard, shortening, ghee, or bacon fat. Coconut, palm kernel, and palm oils. Beverages Alcohol. Sugar-sweetened drinks such as sodas, lemonade, and fruit drinks. Sweets and desserts Corn syrup, sugars, honey, and molasses. Candy. Jam and jelly. Syrup. Sweetened cereals. Cookies, pies, cakes, donuts, muffins, and ice cream. The items listed above may not be a complete list of foods and beverages you should avoid. Contact a dietitian for more information. Summary Choosing the right foods helps keep your fat and cholesterol at normal levels. This can keep you from getting certain diseases. At meals, fill one-half of your plate with vegetables, green salads, and fruits. Eat high fiber foods, like whole grains, beans, apples, pears, berries, carrots, peas, and barley. Limit added sugar, saturated fats, alcohol, and fried foods. This information is not intended to replace advice given to you by your health care provider. Make sure you discuss any questions you have with your health care provider. Document Revised: 09/06/2020 Document Reviewed: 09/06/2020 Elsevier Patient Education  2022 [employer]vier Inc.  

## 2021-04-24 ENCOUNTER — Encounter: Payer: Self-pay | Admitting: Nurse Practitioner

## 2021-04-30 ENCOUNTER — Ambulatory Visit
Admission: RE | Admit: 2021-04-30 | Discharge: 2021-04-30 | Disposition: A | Discharge status: Home / Self Care | Payer: BC Managed Care – PPO | Source: Ambulatory Visit | Attending: Nurse Practitioner | Admitting: Nurse Practitioner

## 2021-04-30 ENCOUNTER — Other Ambulatory Visit: Payer: Self-pay

## 2021-04-30 DIAGNOSIS — R1084 Generalized abdominal pain: Secondary | ICD-10-CM | POA: Insufficient documentation

## 2021-04-30 DIAGNOSIS — I880 Nonspecific mesenteric lymphadenitis: Secondary | ICD-10-CM | POA: Diagnosis not present

## 2021-04-30 DIAGNOSIS — K760 Fatty (change of) liver, not elsewhere classified: Secondary | ICD-10-CM | POA: Diagnosis not present

## 2021-04-30 DIAGNOSIS — R109 Unspecified abdominal pain: Secondary | ICD-10-CM | POA: Diagnosis not present

## 2021-04-30 LAB — POCT I-STAT CREATININE: Creatinine, Ser: 0.9 mg/dL (ref 0.44–1.00)

## 2021-04-30 MED ORDER — IOHEXOL 300 MG/ML  SOLN
100.0000 mL | Freq: Once | INTRAMUSCULAR | Status: AC | PRN
  Administered 2021-04-30: 14:00:00 100 mL via INTRAVENOUS

## 2021-05-01 LAB — HM DIABETES EYE EXAM

## 2021-05-05 ENCOUNTER — Encounter: Payer: Self-pay | Admitting: Nurse Practitioner

## 2021-05-05 DIAGNOSIS — R634 Abnormal weight loss: Secondary | ICD-10-CM

## 2021-05-05 NOTE — Progress Notes (Signed)
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 I encouraged referral to surgeon or to surgeon who did initial weight loss surgery. A MyChart message was sent to the patient.

## 2021-05-06 ENCOUNTER — Other Ambulatory Visit: Payer: Self-pay | Admitting: Nurse Practitioner

## 2021-05-06 DIAGNOSIS — K316 Fistula of stomach and duodenum: Secondary | ICD-10-CM

## 2021-05-06 DIAGNOSIS — R1084 Generalized abdominal pain: Secondary | ICD-10-CM

## 2021-05-06 NOTE — Progress Notes (Signed)
A referral was placed to genera;/bariatric surgery. 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 May be contributing to abdominal pain/discomfort.

## 2021-05-07 ENCOUNTER — Encounter: Payer: Self-pay | Admitting: Nurse Practitioner

## 2021-05-13 NOTE — Telephone Encounter (Signed)
Hey. I did refer her to bariatric surgery as she had gastric bypass in the past. She had requested a surgeon in the [facility] system, so I did not specify anywhere for her to go. Can we change the location of were she has the consultation? Please and thank you.

## 2021-05-20 ENCOUNTER — Ambulatory Visit (INDEPENDENT_AMBULATORY_CARE_PROVIDER_SITE_OTHER): Payer: BC Managed Care – PPO | Admitting: Nurse Practitioner

## 2021-05-20 ENCOUNTER — Other Ambulatory Visit: Payer: Self-pay

## 2021-05-20 ENCOUNTER — Encounter: Payer: Self-pay | Admitting: Nurse Practitioner

## 2021-05-20 VITALS — BP 133/72 | HR 82 | Temp 97.7°F | Ht 64.0 in | Wt 226.1 lb

## 2021-05-20 DIAGNOSIS — M792 Neuralgia and neuritis, unspecified: Secondary | ICD-10-CM | POA: Diagnosis not present

## 2021-05-20 DIAGNOSIS — R1084 Generalized abdominal pain: Secondary | ICD-10-CM | POA: Diagnosis not present

## 2021-05-20 DIAGNOSIS — M7918 Myalgia, other site: Secondary | ICD-10-CM

## 2021-05-20 DIAGNOSIS — Z6838 Body mass index (BMI) 38.0-38.9, adult: Secondary | ICD-10-CM

## 2021-05-20 MED ORDER — TIZANIDINE HCL 4 MG PO TABS: ORAL_TABLET | ORAL | 3 refills | Status: DC

## 2021-05-20 MED ORDER — HYDROCODONE-ACETAMINOPHEN 10-325 MG PO TABS: 1.0000 | ORAL_TABLET | Freq: Two times a day (BID) | ORAL | 0 refills | Status: DC | PRN

## 2021-05-20 NOTE — Patient Instructions (Addendum)
Diabetes Mellitus and Exercise Exercising regularly is important for overall health, especially for people who have diabetes mellitus. Exercising is not only about losing weight. It has many other health benefits, such as increasing muscle strength and bone density and reducing body fat and stress. This leads to improved fitness, flexibility, and endurance, all of which result in better overall health. What are the benefits of exercise if I have diabetes? Exercise has many benefits for people with diabetes. They include: Helping to lower and control blood sugar (glucose). Helping the body to respond better to the hormone insulin by improving insulin sensitivity. Reducing how much insulin the body needs. Lowering the risk for heart disease by: Lowering "bad" cholesterol and triglyceride levels. Increasing "good" cholesterol levels. Lowering blood pressure. Lowering blood glucose levels. What is my activity plan? Your health care provider or certified diabetes educator can help you make a plan for the type and frequency of exercise that works for you. This is called your activity plan. Be sure to: Get at least 150 minutes of medium-intensity or high-intensity exercise each week. Exercises may include brisk walking, biking, or water aerobics. Do stretching and strengthening exercises, such as yoga or weight lifting, at least 2 times a week. Spread out your activity over at least 3 days of the week. Get some form of physical activity each day. Do not go more than 2 days in a row without some kind of physical activity. Avoid being inactive for more than 90 minutes at a time. Take frequent breaks to walk or stretch. Choose exercises or activities that you enjoy. Set realistic goals. Start slowly and gradually increase your exercise intensity over time. How do I manage my diabetes during exercise? Monitor your blood glucose Check your blood glucose before and after exercising. If your blood glucose  is: 240 mg/dL (13.3 mmol/L) or higher before you exercise, check your urine for ketones. These are chemicals created by the liver. If you have ketones in your urine, do not exercise until your blood glucose returns to normal. 100 mg/dL (5.6 mmol/L) or lower, eat a snack containing 15-20 grams of carbohydrate. Check your blood glucose 15 minutes after the snack to make sure that your glucose level is above 100 mg/dL (5.6 mmol/L) before you start your exercise. Know the symptoms of low blood glucose (hypoglycemia) and how to treat it. Your risk for hypoglycemia increases during and after exercise. Follow these tips and your health care provider's instructions Keep a carbohydrate snack that is fast-acting for use before, during, and after exercise to help prevent or treat hypoglycemia. Avoid injecting insulin into areas of the body that are going to be exercised. For example, avoid injecting insulin into: Your arms, when you are about to play tennis. Your legs, when you are about to go jogging. Keep records of your exercise habits. Doing this can help you and your health care provider adjust your diabetes management plan as needed. Write down: Food that you eat before and after you exercise. Blood glucose levels before and after you exercise. The type and amount of exercise you have done. Work with your health care provider when you start a new exercise or activity. He or she may need to: Make sure that the activity is safe for you. Adjust your insulin, other medicines, and food that you eat. Drink plenty of water while you exercise. This prevents loss of water (dehydration) and problems caused by a lot of heat in the body (heat stroke). Where to find more information  American Diabetes Association: www.diabetes.org Summary Exercising regularly is important for overall health, especially for people who have diabetes mellitus. Exercising has many health benefits. It increases muscle strength and bone  density and reduces body fat and stress. It also lowers and controls blood glucose. Your health care provider or certified diabetes educator can help you make an activity plan for the type and frequency of exercise that works for you. Work with your health care provider to make sure any new activity is safe for you. Also work with your health care provider to adjust your insulin, other medicines, and the food you eat. This information is not intended to replace advice given to you by your health care provider. Make sure you discuss any questions you have with your health care provider. Document Revised: 01/23/2019 Document Reviewed: 01/23/2019 Elsevier Patient Education  2022 [place] and Cholesterol Restricted Eating Plan Getting too much fat and cholesterol in your diet may cause health problems. Choosing the right foods helps keep your fat and cholesterol at normal levels. This can keep you from getting certain diseases. Your doctor may recommend an eating plan that includes: Total fat: ______% or less of total calories a day. This is ______g of fat a day. Saturated fat: ______% or less of total calories a day. This is ______g of saturated fat a day. Cholesterol: less than _________mg a day. Fiber: ______g a day. What are tips for following this plan? General tips Work with your doctor to lose weight if you need to. Avoid: Foods with added sugar. Fried foods. Foods with trans fat or partially hydrogenated oils. This includes some margarines and baked goods. If you drink alcohol: Limit how much you have to: 0-1 drink a day for women who are not pregnant. 0-2 drinks a day for men. Know how much alcohol is in a drink. In the [place] one drink equals one 12 oz bottle of beer (355 mL), one 5 oz glass of wine (148 mL), or one 1 oz glass of hard liquor (44 mL). Reading food labels Check food labels for: Trans fats. Partially hydrogenated oils. Saturated fat (g) in each  serving. Cholesterol (mg) in each serving. Fiber (g) in each serving. Choose foods with healthy fats, such as: Monounsaturated fats and polyunsaturated fats. These include olive and canola oil, flaxseeds, walnuts, almonds, and seeds. Omega-3 fats. These are found in certain fish, flaxseed oil, and ground flaxseeds. Choose grain products that have whole grains. Look for the word "whole" as the first word in the ingredient list. Cooking Cook foods using low-fat methods. These include baking, boiling, grilling, and broiling. Eat more home-cooked foods. Eat at restaurants and buffets less often. Eat less fast food. Avoid cooking using saturated fats, such as butter, cream, palm oil, palm kernel oil, and coconut oil. Meal planning  At meals, divide your plate into four equal parts: Fill one-half of your plate with vegetables, green salads, and fruit. Fill one-fourth of your plate with whole grains. Fill one-fourth of your plate with low-fat (lean) protein foods. Eat fish that is high in omega-3 fats at least two times a week. This includes mackerel, tuna, sardines, and salmon. Eat foods that are high in fiber, such as whole grains, beans, apples, pears, berries, broccoli, carrots, peas, and barley. What foods should I eat? Fruits All fresh, canned (in natural juice), or frozen fruits. Vegetables Fresh or frozen vegetables (raw, steamed, roasted, or grilled). Green salads. Grains Whole grains, such as whole wheat or whole grain breads,  crackers, cereals, and pasta. Unsweetened oatmeal, bulgur, barley, quinoa, or brown rice. Corn or whole wheat flour tortillas. Meats and other protein foods Ground beef (85% or leaner), grass-fed beef, or beef trimmed of fat. Skinless chicken or [place]. Ground chicken or [place]. Pork trimmed of fat. All fish and seafood. Egg whites. Dried beans, peas, or lentils. Unsalted nuts or seeds. Unsalted canned beans. Nut butters without added sugar or oil. Dairy Low-fat  or nonfat dairy products, such as skim or 1% milk, 2% or reduced-fat cheeses, low-fat and fat-free ricotta or cottage cheese, or plain low-fat and nonfat yogurt. Fats and oils Tub margarine without trans fats. Light or reduced-fat mayonnaise and salad dressings. Avocado. Olive, canola, sesame, or safflower oils. The items listed above may not be a complete list of foods and beverages you can eat. Contact a dietitian for more information. What foods should I avoid? Fruits Canned fruit in heavy syrup. Fruit in cream or butter sauce. Fried fruit. Vegetables Vegetables cooked in cheese, cream, or butter sauce. Fried vegetables. Grains White bread. White pasta. White rice. Cornbread. Bagels, pastries, and croissants. Crackers and snack foods that contain trans fat and hydrogenated oils. Meats and other protein foods Fatty cuts of meat. Ribs, chicken wings, bacon, sausage, bologna, salami, chitterlings, fatback, hot dogs, bratwurst, and packaged lunch meats. Liver and organ meats. Whole eggs and egg yolks. Chicken and [place] with skin. Fried meat. Dairy Whole or 2% milk, cream, half-and-half, and cream cheese. Whole milk cheeses. Whole-fat or sweetened yogurt. Full-fat cheeses. Nondairy creamers and whipped toppings. Processed cheese, cheese spreads, and cheese curds. Fats and oils Butter, stick margarine, lard, shortening, ghee, or bacon fat. Coconut, palm kernel, and palm oils. Beverages Alcohol. Sugar-sweetened drinks such as sodas, lemonade, and fruit drinks. Sweets and desserts Corn syrup, sugars, honey, and molasses. Candy. Jam and jelly. Syrup. Sweetened cereals. Cookies, pies, cakes, donuts, muffins, and ice cream. The items listed above may not be a complete list of foods and beverages you should avoid. Contact a dietitian for more information. Summary Choosing the right foods helps keep your fat and cholesterol at normal levels. This can keep you from getting certain diseases. At meals,  fill one-half of your plate with vegetables, green salads, and fruits. Eat high fiber foods, like whole grains, beans, apples, pears, berries, carrots, peas, and barley. Limit added sugar, saturated fats, alcohol, and fried foods. This information is not intended to replace advice given to you by your health care provider. Make sure you discuss any questions you have with your health care provider. Document Revised: 09/06/2020 Document Reviewed: 09/06/2020 Elsevier Patient Education  [place].

## 2021-05-20 NOTE — Progress Notes (Signed)
Established Patient Office Visit  Subjective:  Patient ID: Wendy Hubbard, female    DOB: 02-27-1956  Age: 66 y.o. MRN: [id number]  CC:  Chief Complaint  Patient presents with   Follow-up    HPI  [name] presents for follow up of abdominal pain. She recently had CT scan of the abdomen due to persistent pain. It did show: 1. 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 2. Hepatic steatosis. 3. Otherwise no acute intra-abdominal or intrapelvic abnormality. 4.  Aortic Atherosclerosis (ICD10-I70.0).  In addition to the abdominal pain she does have frequent diarrhea. She takes lomotil as needed to control this. It gives her mild relief.   She does have a referral to see surgeon at [facility]. She is waiting on initial appointment to be made.  She has been referred for severe neuropathy of both feet. She has been referred to new neurologist due to the severity of the neuropathy. She is on a cancellation list. Will otherwise be seen on March 3.   Does have a chronic and worsening peripheral neuropathy in both feet.  Have an EMG done on 06/21/2020 through [facility].  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  She states that pain in her feet is most severe during the night.  She states that burning type pain is excruciating.  She states that even the sheets touching her feet makes pain worse.  She does take a dose of hydrocodone/APAP 10/325 mg to help this pain at night and to help her rest.    Indication for chronic opioid: severe neuropathy in both feet Medication and dose: hydrocodone/APAP 10/381m tablets taken 1 to 2 times daily as needed  # pills per month: 45 Opioid Treatment Agreement signed (Y/N): yes - 01/20/2021 Opioid Treatment Agreement last reviewed with patient:  05/20/2021 NCCSRS reviewed this encounter (include red flags): Yes   Her PDMP profile was reviewed on 05/20/2021. Her overdose risk score is  300. The last time her hydrocodone/APAP 10/3235mwas 04/21/2021.   Past Medical History:  Diagnosis Date   Allergy    Phreesia 07/23/2020   Anxiety    Phreesia 07/23/2020   COPD (chronic obstructive pulmonary disease) (HCC)    DDD (degenerative disc disease), thoracic    Depression    Phreesia 07/23/2020   Diabetes mellitus without complication ([place]   Phreesia 07/23/2020   GERD (gastroesophageal reflux disease)    Hyperlipidemia    Hypertension    Rapid heart rate    Sleep apnea    Wears contact lenses     Past Surgical History:  Procedure Laterality Date   ABDOMINAL HYSTERECTOMY     ANKLE ARTHROSCOPY Bilateral    APPENDECTOMY     BLADDER SUSPENSION     BREAST EXCISIONAL BIOPSY Left 1990   neg   BREAST EXCISIONAL BIOPSY Right 1989   neg   BREAST SURGERY     CHOLECYSTECTOMY     COLONOSCOPY N/A 11/04/2015   Procedure: COLONOSCOPY;  Surgeon: RoManya SilvasMD;  [place] [facility];  Laterality: N/A;   COLONOSCOPY WITH PROPOFOL N/A 08/30/2020   Procedure: COLONOSCOPY WITH PROPOFOL;  Surgeon: WoLucilla LameMD;  [place] [facility];  Laterality: N/A;   ESOPHAGOGASTRODUODENOSCOPY N/A 11/02/2015   Procedure: ESOPHAGOGASTRODUODENOSCOPY (EGD);  Surgeon: RoManya SilvasMD;  [facility];  Service: Endoscopy;  Laterality: N/A;   KNEE ARTHROSCOPY Left    POLYPECTOMY  08/30/2020  Procedure: POLYPECTOMY;  Surgeon: Lucilla Lame, MD;  Location[facility];  Service: Endoscopy;;   REDUCTION MAMMAPLASTY Bilateral 1988   TONSILLECTOMY      Family History  Problem Relation Age of Onset   Hypertension Other    Bladder Cancer Mother    High Cholesterol Mother    High blood pressure Mother    Cancer Mother    Heart attack Father    High blood pressure Father    Kidney cancer Neg Hx    Prostate cancer Neg Hx    Breast cancer Neg Hx     Social History   Socioeconomic History   Marital status:  Married    Spouse name: Not on file   Number of children: Not on file   Years of education: Not on file   Highest education level: Not on file  Occupational History   Not on file  Tobacco Use   Smoking status: Former   Smokeless tobacco: Never   Tobacco comments:    quit in 1996  Vaping Use   Vaping Use: Never used  Substance and Sexual Activity   Alcohol use: Yes    Alcohol/week: 2.0 standard drinks    Types: 1 Glasses of wine, 1 Standard drinks or equivalent per week    Comment: occasional   Drug use: No   Sexual activity: Yes    Partners: Male  Other Topics Concern   Not on file  Social History Narrative   Not on file   Social Determinants of Health   Financial Resource Strain: Not on file  Food Insecurity: Not on file  Transportation Needs: Not on file  Physical Activity: Not on file  Stress: Not on file  Social Connections: Not on file  Intimate Partner Violence: Not on file    Outpatient Medications Prior to Visit  Medication Sig Dispense Refill   alosetron (LOTRONEX) 1 MG tablet Take 1 tablet (1 mg total) by mouth 2 (two) times daily. 60 tablet 5   ALPRAZolam (XANAX) 0.5 MG tablet TAKE 1/2 TO 1 TABLET BY MOUTH ONCE DAILYAS NEEDED FOR ACUTE ANXIETY 30 tablet 0   butalbital-acetaminophen-caffeine (FIORICET) 50-325-40 MG tablet Take 1-2 tablets by mouth every 6 (six) hours as needed for headache. 30 tablet 2   cilostazol (PLETAL) 100 MG tablet Take 1 tablet (100 mg total) by mouth 2 (two) times daily. 180 tablet 1   diphenoxylate-atropine (LOMOTIL) 2.5-0.025 MG tablet Take 1 tablet by mouth 4 (four) times daily as needed for diarrhea or loose stools. 30 tablet 1   escitalopram (LEXAPRO) 10 MG tablet Take 1 tablet (10 mg total) by mouth daily. 180 tablet 0   furosemide (LASIX) 20 MG tablet Take 1 tablet (20 mg total) by mouth daily. Take once a day as needed edema 90 tablet 0   gabapentin (NEURONTIN) 600 MG tablet Take one AM, one after lunch, and 2 QHS 360 tablet 3    loratadine (CLARITIN) 10 MG tablet Take 10 mg by mouth daily as needed for allergies.     metoprolol succinate (TOPROL-XL) 50 MG 24 hr tablet TAKE 1 and 1/2 TABLETS BY MOUTH AT BEDTIME 135 tablet 1   mirtazapine (REMERON) 15 MG tablet TAKE 1 TABLET BY MOUTH AT BEDTIME FOR SLEEP 90 tablet 1   montelukast (SINGULAIR) 10 MG tablet TAKE 1 TABLET BY MOUTH AT BEDTIME 90 tablet 0   Multiple Vitamins-Minerals (BARIATRIC MULTIVITAMINS/IRON PO) Take by mouth.     ondansetron (ZOFRAN-ODT) 4 MG disintegrating tablet Take 1  to 2 tablets po TID prn nausea 45 tablet 1   pantoprazole (PROTONIX) 40 MG tablet TAKE 1 TABLET BY MOUTH ONCE DAILY 90 tablet 1   Probiotic Product (PROBIOTIC PO) Take by mouth.     rOPINIRole (REQUIP) 0.5 MG tablet TAKE 1 TABLET BY MOUTH AT BEDTIME MAY INCREASE TO 2 TABS AT BEDTIME AS NEEDED TOLERATED 180 tablet 1   rosuvastatin (CRESTOR) 10 MG tablet TAKE 1 TABLET BY MOUTH ONCE DAILY 90 tablet 1   Semaglutide (RYBELSUS) 7 MG TABS Take 7-14 mg by mouth daily. 180 tablet 1   Sod Picosulfate-Mag Ox-Cit Acd (CLENPIQ) 10-3.5-12 MG-GM -GM/160ML SOLN Take 320 mLs by mouth as directed. 320 mL 0   HYDROcodone-acetaminophen (NORCO) 10-325 MG tablet Take 1 tablet by mouth 2 (two) times daily as needed. 45 tablet 0   tiZANidine (ZANAFLEX) 4 MG tablet Take 1 tablet po BID prn muscle pain/spasms. (Patient taking differently: Take 4 mg by mouth every 6 (six) hours as needed.) 45 tablet 3   No facility-administered medications prior to visit.    Allergies  Allergen Reactions   Flagyl [Metronidazole] Anaphylaxis and Rash   Other    Penicillins Anaphylaxis and Other (See Comments)    Has patient had a PCN reaction causing immediate rash, facial/tongue/throat swelling, SOB or lightheadedness with hypotension: Yes Has patient had a PCN reaction causing severe rash involving mucus membranes or skin necrosis: No Has patient had a PCN reaction that required hospitalization No Has patient had a PCN  reaction occurring within the last 10 years: No If all of the above answers are "NO", then may proceed with Cephalosporin use.   Doxycycline Hives    ROS Review of Systems  Constitutional:  Positive for activity change and fatigue. Negative for appetite change, chills and fever.  HENT:  Negative for congestion, postnasal drip, rhinorrhea, sinus pressure, sinus pain, sneezing and sore throat.   Eyes: Negative.   Respiratory:  Negative for cough, chest tightness, shortness of breath and wheezing.   Cardiovascular:  Negative for chest pain and palpitations.  Gastrointestinal:  Positive for abdominal pain and diarrhea. Negative for constipation, nausea and vomiting.  Endocrine: Negative for cold intolerance, heat intolerance, polydipsia and polyuria.  Genitourinary:  Negative for dyspareunia, dysuria, flank pain, frequency and urgency.  Musculoskeletal:  Positive for myalgias. Negative for arthralgias and back pain.  Skin:  Negative for rash.  Allergic/Immunologic: Negative for environmental allergies.  Neurological:  Negative for dizziness, weakness and headaches.       Severe numbness and neuropathy in both feet and lower legs.  Hematological:  Negative for adenopathy.  Psychiatric/Behavioral:  Positive for dysphoric mood. The patient is nervous/anxious.      Objective:    Physical Exam Vitals and nursing note reviewed.  Constitutional:      Appearance: Normal appearance. She is well-developed. She is obese.  HENT:     Head: Normocephalic and atraumatic.     Nose: Nose normal.     Mouth/Throat:     Mouth: Mucous membranes are moist.  Eyes:     Extraocular Movements: Extraocular movements intact.     Conjunctiva/sclera: Conjunctivae normal.     Pupils: Pupils are equal, round, and reactive to light.  Cardiovascular:     Rate and Rhythm: Normal rate and regular rhythm.     Pulses: Normal pulses.     Heart sounds: Normal heart sounds.  Pulmonary:     Effort: Pulmonary effort is  normal.     Breath sounds: Normal breath  sounds.  Abdominal:     General: Bowel sounds are normal. There is no distension.     Palpations: Abdomen is soft. There is no mass.     Tenderness: There is abdominal tenderness. There is no guarding or rebound.     Hernia: No hernia is present.     Comments: There is generalized tenderness present with palpation of the abdomen.   Musculoskeletal:        General: Normal range of motion.     Cervical back: Normal range of motion and neck supple.  Lymphadenopathy:     Cervical: No cervical adenopathy.  Skin:    General: Skin is warm and dry.     Capillary Refill: Capillary refill takes less than 2 seconds.  Neurological:     General: No focal deficit present.     Mental Status: She is alert and oriented to person, place, and time.  Psychiatric:        Mood and Affect: Mood normal.        Behavior: Behavior normal.        Thought Content: Thought content normal.        Judgment: Judgment normal.    Today's Vitals   05/20/21 1012  BP: 133/72  Pulse: 82  Temp: 97.7 F (36.5 C)  SpO2: 96%  Weight: 226 lb 1.6 oz (102.6 kg)  Height: 5' 4"  (1.626 m)   Body mass index is 38.81 kg/m.   Wt Readings from Last 3 Encounters:  05/20/21 226 lb 1.6 oz (102.6 kg)  04/21/21 222 lb 6.4 oz (100.9 kg)  03/24/21 223 lb 6.4 oz (101.3 kg)     Health Maintenance Due  Topic Date Due   Pneumonia Vaccine 36+ Years old (1 - PCV) Never done   TETANUS/TDAP  Never done   COVID-19 Vaccine (3 - Booster for Pfizer series) 10/24/2019   MAMMOGRAM  07/19/2020   DEXA SCAN  Never done   FOOT EXAM  01/14/2021    There are no preventive care reminders to display for this patient.  Lab Results  Component Value Date   TSH 2.080 08/07/2020   Lab Results  Component Value Date   WBC 6.3 10/01/2020   HGB 13.6 10/01/2020   HCT 42.4 10/01/2020   MCV 97.0 10/01/2020   PLT 262 10/01/2020   Lab Results  Component Value Date   NA 138 10/01/2020   K 4.1  10/01/2020   CO2 25 10/01/2020   GLUCOSE 127 (H) 10/01/2020   BUN 10 10/01/2020   CREATININE 0.90 04/30/2021   BILITOT 0.6 10/01/2020   ALKPHOS 99 10/01/2020   AST 27 10/01/2020   ALT 15 10/01/2020   PROT 7.1 10/01/2020   ALBUMIN 3.9 10/01/2020   CALCIUM 9.0 10/01/2020   ANIONGAP 11 10/01/2020   EGFR 73 08/07/2020   Lab Results  Component Value Date   CHOL 174 08/07/2020   Lab Results  Component Value Date   HDL 57 08/07/2020   Lab Results  Component Value Date   LDLCALC 75 08/07/2020   Lab Results  Component Value Date   TRIG 259 (H) 08/07/2020   Lab Results  Component Value Date   CHOLHDL 3.1 08/07/2020   Lab Results  Component Value Date   HGBA1C 5.8 (A) 04/21/2021      Assessment & Plan:  1. Generalized abdominal pain Reviewed abdominal CT with patient.  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  She is already set up to have a consultation with [facility].  2. Rhomboid muscle pain She does have shoulder pain and low back pain.  He takes Zanaflex twice daily as needed for muscle pain and spasms. - tiZANidine (ZANAFLEX) 4 MG tablet; ake 1 tablet po BID prn muscle spasms.  Dispense: 45 tablet; Refill: 3  3. Peripheral neuropathic pain Reviewed her opioid controlled substances agreement as well PDMP profile.  May continue to take hydrocodone/APAP 10/325 mg tablets, up to twice daily if needed for severe pain.  A prescription for #45 tablets was sent to her pharmacy today. - HYDROcodone-acetaminophen (NORCO) 10-325 MG tablet; Take 1 tablet by mouth 2 (two) times daily as needed.  Dispense: 45 tablet; Refill: 0  4. Body mass index (BMI) of 38.0-38.9 in adult Discussed lowering calorie intake to 1500 calories per day and incorporating exercise into daily routine to help lose weight.  Currently on Rybelsus to help control blood sugar and to help her with weight loss.  Problem List Items  Addressed This Visit       Other   Peripheral neuropathic pain   Relevant Medications   HYDROcodone-acetaminophen (NORCO) 10-325 MG tablet   Rhomboid muscle pain   Relevant Medications   tiZANidine (ZANAFLEX) 4 MG tablet   Body mass index (BMI) of 38.0-38.9 in adult   Generalized abdominal pain - Primary    Meds ordered this encounter  Medications   tiZANidine (ZANAFLEX) 4 MG tablet    Sig: ake 1 tablet po BID prn muscle spasms.    Dispense:  45 tablet    Refill:  3    Updating prescription only.    Order Specific Question:   Supervising Provider    Answer:   Hali Marry [[id number]]   HYDROcodone-acetaminophen (NORCO) 10-325 MG tablet    Sig: Take 1 tablet by mouth 2 (two) times daily as needed.    Dispense:  45 tablet    Refill:  0    This is not new prescription. This is ongoing treatment for chronic pain.    Order Specific Question:   Supervising Provider    Answer:   Beatrice Lecher D [[id number]]    Follow-up: Return in about 2 months (around 07/18/2021) for diabetes with HgbA1c check, blood pressure.    Ronnell Freshwater, NP  This note was dictated using Systems analyst. Rapid proofreading was performed to expedite the delivery of the information. Despite proofreading, phonetic errors will occur which are common with this voice recognition software. Please take this into consideration. If there are any concerns, please contact our office.

## 2021-05-23 DIAGNOSIS — E119 Type 2 diabetes mellitus without complications: Secondary | ICD-10-CM | POA: Diagnosis not present

## 2021-05-23 DIAGNOSIS — H16223 Keratoconjunctivitis sicca, not specified as Sjogren's, bilateral: Secondary | ICD-10-CM | POA: Diagnosis not present

## 2021-05-23 DIAGNOSIS — H35373 Puckering of macula, bilateral: Secondary | ICD-10-CM | POA: Diagnosis not present

## 2021-05-23 DIAGNOSIS — H04223 Epiphora due to insufficient drainage, bilateral lacrimal glands: Secondary | ICD-10-CM | POA: Diagnosis not present

## 2021-05-24 ENCOUNTER — Other Ambulatory Visit: Payer: Self-pay | Admitting: Nurse Practitioner

## 2021-05-24 ENCOUNTER — Telehealth: Payer: Self-pay | Admitting: Sports Medicine

## 2021-05-24 DIAGNOSIS — J0140 Acute pansinusitis, unspecified: Secondary | ICD-10-CM

## 2021-05-24 DIAGNOSIS — J301 Allergic rhinitis due to pollen: Secondary | ICD-10-CM

## 2021-05-24 MED ORDER — FLUTICASONE PROPIONATE 50 MCG/ACT NA SUSP: NASAL | 3 refills | Status: DC

## 2021-05-24 MED ORDER — AZITHROMYCIN 250 MG PO TABS: ORAL_TABLET | ORAL | 0 refills | Status: DC

## 2021-05-24 NOTE — Telephone Encounter (Addendum)
66 year old penicillin and doxycycline allergic female with a slew of medical problems calling in with a few day history of low-grade fevers in the upper 99's, headaches, facial pain and pressure, nasal discharge.  I have asked the nurse to asked the patient to go ahead and get a home COVID test and this will help guide our treatment, if COVID-positive we will use Paxlovid, if negative we will probably do a course of azithromycin and Flonase for suspected sinusitis.  Update:  COVID negative, adding Zpak and flonase.

## 2021-05-24 NOTE — Addendum Note (Signed)
Addended by: Silverio Decamp on: 05/24/2021 09:24 AM   Modules accepted: Orders

## 2021-05-25 NOTE — Telephone Encounter (Signed)
Thank you so much. Wendy Hubbard

## 2021-05-26 ENCOUNTER — Ambulatory Visit: Payer: BC Managed Care – PPO | Admitting: Surgery

## 2021-05-28 NOTE — Telephone Encounter (Signed)
Hey. Can we check her referral to bariatric surgery? 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 Recent CT scan shows these results. Thanks so much.   -HB

## 2021-06-01 ENCOUNTER — Other Ambulatory Visit: Payer: Self-pay | Admitting: Nurse Practitioner

## 2021-06-01 DIAGNOSIS — R600 Localized edema: Secondary | ICD-10-CM

## 2021-06-04 NOTE — Telephone Encounter (Signed)
What do you think about this?

## 2021-06-06 ENCOUNTER — Other Ambulatory Visit: Payer: Self-pay | Admitting: Nurse Practitioner

## 2021-06-06 DIAGNOSIS — E782 Mixed hyperlipidemia: Secondary | ICD-10-CM

## 2021-06-09 ENCOUNTER — Ambulatory Visit (INDEPENDENT_AMBULATORY_CARE_PROVIDER_SITE_OTHER): Payer: BC Managed Care – PPO | Admitting: Nurse Practitioner

## 2021-06-09 ENCOUNTER — Encounter: Payer: Self-pay | Admitting: Nurse Practitioner

## 2021-06-09 ENCOUNTER — Other Ambulatory Visit: Payer: Self-pay

## 2021-06-09 VITALS — BP 120/73 | HR 99 | Temp 97.8°F | Ht 64.0 in | Wt 227.3 lb

## 2021-06-09 DIAGNOSIS — F331 Major depressive disorder, recurrent, moderate: Secondary | ICD-10-CM | POA: Insufficient documentation

## 2021-06-09 DIAGNOSIS — Z6839 Body mass index (BMI) 39.0-39.9, adult: Secondary | ICD-10-CM

## 2021-06-09 MED ORDER — BUPROPION HCL ER (SR) 100 MG PO TB12: 100.0000 mg | ORAL_TABLET | Freq: Two times a day (BID) | ORAL | 2 refills | Status: DC

## 2021-06-09 NOTE — Telephone Encounter (Signed)
Scheduled pt for this afternoon.

## 2021-06-09 NOTE — Progress Notes (Signed)
Established patient visit   Patient: Wendy Hubbard   DOB: 1955/09/21   66 y.o. Female  MRN: [id number] Visit Date: 06/09/2021  Chief Complaint  Patient presents with   Depression   Subjective    Patient states that she has been feeling increased depression for about the past year. She states that last week, she lost her job. She is the primary bread winner in her family and this has caused her depression to spiral downward. 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 They were effective immediately. She denies feeling suicidal. She has good suport system with her family and coworkers who were laid off along with her.   Depression      The patient presents with depression.  This is a new problem.  The current episode started more than 1 month ago.   The onset quality is gradual.   The problem occurs constantly.  The problem has been gradually worsening since onset.  Associated symptoms include fatigue, hopelessness, irritable, headaches and sad.  Associated symptoms include no appetite change, no myalgias and no suicidal ideas.     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  Past treatments include SSRIs - Selective serotonin reuptake inhibitors.  Compliance with treatment is good and variable.  Previous treatment provided mild relief.  Risk factors include stress and major life event.   Past medical history includes depression.      Medications: Outpatient Medications Prior to Visit  Medication Sig   alosetron (LOTRONEX) 1 MG tablet Take 1 tablet (1 mg total) by mouth 2 (two) times daily.   ALPRAZolam (XANAX) 0.5 MG tablet TAKE 1/2 TO 1 TABLET BY MOUTH ONCE DAILYAS NEEDED FOR ACUTE ANXIETY   butalbital-acetaminophen-caffeine (FIORICET) 50-325-40 MG tablet Take 1-2 tablets by mouth every 6 (six) hours as needed for headache.   cilostazol (PLETAL) 100 MG tablet Take 1 tablet (100 mg  total) by mouth 2 (two) times daily.   diphenoxylate-atropine (LOMOTIL) 2.5-0.025 MG tablet Take 1 tablet by mouth 4 (four) times daily as needed for diarrhea or loose stools.   escitalopram (LEXAPRO) 10 MG tablet Take 1 tablet (10 mg total) by mouth daily.   fluticasone (FLONASE) 50 MCG/ACT nasal spray One spray in each nostril twice a day   furosemide (LASIX) 20 MG tablet TAKE 1 TABLET DAILY AS NEEDED FOR EDEMA   gabapentin (NEURONTIN) 600 MG tablet Take one AM, one after lunch, and 2 QHS   HYDROcodone-acetaminophen (NORCO) 10-325 MG tablet Take 1 tablet by mouth 2 (two) times daily as needed.   loratadine (CLARITIN) 10 MG tablet Take 10 mg by mouth daily as needed for allergies.   metoprolol succinate (TOPROL-XL) 50 MG 24 hr tablet TAKE 1 and 1/2 TABLETS BY MOUTH AT BEDTIME   mirtazapine (REMERON) 15 MG tablet TAKE 1 TABLET BY MOUTH AT BEDTIME FOR SLEEP   montelukast (SINGULAIR) 10 MG tablet TAKE 1 TABLET BY MOUTH AT BEDTIME   Multiple Vitamins-Minerals (BARIATRIC MULTIVITAMINS/IRON PO) Take by mouth.   pantoprazole (PROTONIX) 40 MG tablet TAKE 1 TABLET BY MOUTH ONCE DAILY   Probiotic Product (PROBIOTIC PO) Take by mouth.   rOPINIRole (REQUIP) 0.5 MG tablet TAKE 1 TABLET BY MOUTH AT BEDTIME MAY INCREASE TO 2 TABS AT BEDTIME AS NEEDED TOLERATED   rosuvastatin (CRESTOR) 10 MG tablet TAKE 1 TABLET BY MOUTH ONCE DAILY   Semaglutide (RYBELSUS) 7 MG TABS Take 7-14 mg  by mouth daily.   Sod Picosulfate-Mag Ox-Cit Acd (CLENPIQ) 10-3.5-12 MG-GM -GM/160ML SOLN Take 320 mLs by mouth as directed.   tiZANidine (ZANAFLEX) 4 MG tablet ake 1 tablet po BID prn muscle spasms.   [DISCONTINUED] azithromycin (ZITHROMAX Z-PAK) 250 MG tablet Take 2 tablets (500 mg) on  Day 1,  followed by 1 tablet (250 mg) once daily on Days 2 through 5.   [DISCONTINUED] ondansetron (ZOFRAN-ODT) 4 MG disintegrating tablet Take 1 to 2 tablets po TID prn nausea   No facility-administered medications prior to visit.    Review of  Systems  Constitutional:  Positive for fatigue. Negative for activity change, appetite change, chills and fever.  HENT:  Negative for congestion, postnasal drip, rhinorrhea, sinus pressure, sinus pain, sneezing and sore throat.   Eyes: Negative.   Respiratory:  Negative for cough, chest tightness, shortness of breath and wheezing.   Cardiovascular:  Negative for chest pain and palpitations.  Gastrointestinal:  Positive for diarrhea. Negative for abdominal pain, constipation, nausea and vomiting.  Endocrine: Negative for cold intolerance, heat intolerance, polydipsia and polyuria.  Genitourinary:  Negative for dyspareunia, dysuria, flank pain, frequency and urgency.  Musculoskeletal:  Negative for arthralgias, back pain and myalgias.  Skin:  Negative for rash.  Allergic/Immunologic: Negative for environmental allergies.  Neurological:  Positive for headaches. Negative for dizziness and weakness.  Hematological:  Negative for adenopathy.  Psychiatric/Behavioral:  Positive for depression, dysphoric mood and sleep disturbance. Negative for suicidal ideas. The patient is nervous/anxious.     Objective     Today's Vitals   06/09/21 1519  BP: 120/73  Pulse: 99  Temp: 97.8 F (36.6 C)  SpO2: 91%  Weight: 227 lb 4.8 oz (103.1 kg)  Height: 5\' 4"  (1.626 m)   Body mass index is 39.02 kg/m.   Physical Exam Vitals and nursing note reviewed.  Constitutional:      General: She is irritable.     Appearance: Normal appearance. She is well-developed. She is obese.  HENT:     Head: Normocephalic and atraumatic.  Eyes:     Pupils: Pupils are equal, round, and reactive to light.  Cardiovascular:     Rate and Rhythm: Normal rate and regular rhythm.     Pulses: Normal pulses.     Heart sounds: Normal heart sounds.  Pulmonary:     Effort: Pulmonary effort is normal.     Breath sounds: Normal breath sounds.  Abdominal:     Palpations: Abdomen is soft.  Musculoskeletal:        General: Normal  range of motion.     Cervical back: Normal range of motion and neck supple.  Lymphadenopathy:     Cervical: No cervical adenopathy.  Skin:    General: Skin is warm and dry.     Capillary Refill: Capillary refill takes less than 2 seconds.  Neurological:     General: No focal deficit present.     Mental Status: She is alert and oriented to person, place, and time.  Psychiatric:        Attention and Perception: Attention and perception normal.        Mood and Affect: Mood is anxious and depressed. Affect is tearful.        Speech: Speech normal.        Behavior: Behavior normal. Behavior is cooperative.        Thought Content: Thought content normal. Thought content does not include suicidal ideation. Thought content does not include suicidal plan.  Cognition and Memory: Cognition and memory normal.        Judgment: Judgment normal.     Assessment & Plan     1. Moderate episode of recurrent major depressive disorder ([place]) Recently worsening after loss of her job last week. She reports feelings of lethargy and lack of motivation with increased pathologic eating. She denies suicidality. Trial wellbutrin ER 100mg . Advised her totake 1 tablet in the mornings for first week. If tolerating well, may increase to twice daily dosing. She should continue lexapro at 10mg  daily. Considered referral for counseling services. Patient wishes to hold on this for nw as she has good support system through family, friends, and coworkers.  - buPROPion ER (WELLBUTRIN SR) 100 MG 12 hr tablet; Take 1 tablet (100 mg total) by mouth 2 (two) times daily.  Dispense: 60 tablet; Refill: 2  2. Body mass index (BMI) of 39.0-39.9 in adult Discussed lowering calorie intake to 1500 calories per day and incorporating exercise into daily routine to help lose weight. Additional goal of wellbutrin is to decrease appetite. Will follow up with patient in approximately 5 weeks.   Return for as scheduled, mood.         Ronnell Freshwater, NP  [facility] at [facility] [phone] (phone) [phone] (fax)  [place]

## 2021-06-09 NOTE — Patient Instructions (Signed)
Fat and Cholesterol Restricted Eating Plan Getting too much fat and cholesterol in your diet may cause health problems. Choosing the right foods helps keep your fat and cholesterol at normal levels. This can keep you from getting certain diseases. Your doctor may recommend an eating plan that includes: Total fat: ______% or less of total calories a day. This is ______g of fat a day. Saturated fat: ______% or less of total calories a day. This is ______g of saturated fat a day. Cholesterol: less than _________mg a day. Fiber: ______g a day. What are tips for following this plan? General tips Work with your doctor to lose weight if you need to. Avoid: Foods with added sugar. Fried foods. Foods with trans fat or partially hydrogenated oils. This includes some margarines and baked goods. If you drink alcohol: Limit how much you have to: 0-1 drink a day for women who are not pregnant. 0-2 drinks a day for men. Know how much alcohol is in a drink. In the [place] one drink equals one 12 oz bottle of beer (355 mL), one 5 oz glass of wine (148 mL), or one 1 oz glass of hard liquor (44 mL). Reading food labels Check food labels for: Trans fats. Partially hydrogenated oils. Saturated fat (g) in each serving. Cholesterol (mg) in each serving. Fiber (g) in each serving. Choose foods with healthy fats, such as: Monounsaturated fats and polyunsaturated fats. These include olive and canola oil, flaxseeds, walnuts, almonds, and seeds. Omega-3 fats. These are found in certain fish, flaxseed oil, and ground flaxseeds. Choose grain products that have whole grains. Look for the word "whole" as the first word in the ingredient list. Cooking Cook foods using low-fat methods. These include baking, boiling, grilling, and broiling. Eat more home-cooked foods. Eat at restaurants and buffets less often. Eat less fast food. Avoid cooking using saturated fats, such as butter, cream, palm oil, palm kernel oil, and  coconut oil. Meal planning  At meals, divide your plate into four equal parts: Fill one-half of your plate with vegetables, green salads, and fruit. Fill one-fourth of your plate with whole grains. Fill one-fourth of your plate with low-fat (lean) protein foods. Eat fish that is high in omega-3 fats at least two times a week. This includes mackerel, tuna, sardines, and salmon. Eat foods that are high in fiber, such as whole grains, beans, apples, pears, berries, broccoli, carrots, peas, and barley. What foods should I eat? Fruits All fresh, canned (in natural juice), or frozen fruits. Vegetables Fresh or frozen vegetables (raw, steamed, roasted, or grilled). Green salads. Grains Whole grains, such as whole wheat or whole grain breads, crackers, cereals, and pasta. Unsweetened oatmeal, bulgur, barley, quinoa, or brown rice. Corn or whole wheat flour tortillas. Meats and other protein foods Ground beef (85% or leaner), grass-fed beef, or beef trimmed of fat. Skinless chicken or turkey. Ground chicken or turkey. Pork trimmed of fat. All fish and seafood. Egg whites. Dried beans, peas, or lentils. Unsalted nuts or seeds. Unsalted canned beans. Nut butters without added sugar or oil. Dairy Low-fat or nonfat dairy products, such as skim or 1% milk, 2% or reduced-fat cheeses, low-fat and fat-free ricotta or cottage cheese, or plain low-fat and nonfat yogurt. Fats and oils Tub margarine without trans fats. Light or reduced-fat mayonnaise and salad dressings. Avocado. Olive, canola, sesame, or safflower oils. The items listed above may not be a complete list of foods and beverages you can eat. Contact a dietitian for more information. What foods   should I avoid? Fruits Canned fruit in heavy syrup. Fruit in cream or butter sauce. Fried fruit. Vegetables Vegetables cooked in cheese, cream, or butter sauce. Fried vegetables. Grains White bread. White pasta. White rice. Cornbread. Bagels, pastries,  and croissants. Crackers and snack foods that contain trans fat and hydrogenated oils. Meats and other protein foods Fatty cuts of meat. Ribs, chicken wings, bacon, sausage, bologna, salami, chitterlings, fatback, hot dogs, bratwurst, and packaged lunch meats. Liver and organ meats. Whole eggs and egg yolks. Chicken and turkey with skin. Fried meat. Dairy Whole or 2% milk, cream, half-and-half, and cream cheese. Whole milk cheeses. Whole-fat or sweetened yogurt. Full-fat cheeses. Nondairy creamers and whipped toppings. Processed cheese, cheese spreads, and cheese curds. Fats and oils Butter, stick margarine, lard, shortening, ghee, or bacon fat. Coconut, palm kernel, and palm oils. Beverages Alcohol. Sugar-sweetened drinks such as sodas, lemonade, and fruit drinks. Sweets and desserts Corn syrup, sugars, honey, and molasses. Candy. Jam and jelly. Syrup. Sweetened cereals. Cookies, pies, cakes, donuts, muffins, and ice cream. The items listed above may not be a complete list of foods and beverages you should avoid. Contact a dietitian for more information. Summary Choosing the right foods helps keep your fat and cholesterol at normal levels. This can keep you from getting certain diseases. At meals, fill one-half of your plate with vegetables, green salads, and fruits. Eat high fiber foods, like whole grains, beans, apples, pears, berries, carrots, peas, and barley. Limit added sugar, saturated fats, alcohol, and fried foods. This information is not intended to replace advice given to you by your health care provider. Make sure you discuss any questions you have with your health care provider. Document Revised: 09/06/2020 Document Reviewed: 09/06/2020 Elsevier Patient Education  2022 [employer]vier Inc.  

## 2021-06-19 ENCOUNTER — Other Ambulatory Visit: Payer: Self-pay

## 2021-06-19 ENCOUNTER — Encounter: Payer: Self-pay | Admitting: Nurse Practitioner

## 2021-06-19 ENCOUNTER — Ambulatory Visit (INDEPENDENT_AMBULATORY_CARE_PROVIDER_SITE_OTHER): Payer: BC Managed Care – PPO | Admitting: Nurse Practitioner

## 2021-06-19 VITALS — BP 119/73 | HR 93 | Temp 98.5°F | Ht 64.0 in | Wt 225.0 lb

## 2021-06-19 DIAGNOSIS — R059 Cough, unspecified: Secondary | ICD-10-CM | POA: Diagnosis not present

## 2021-06-19 DIAGNOSIS — J0141 Acute recurrent pansinusitis: Secondary | ICD-10-CM | POA: Diagnosis not present

## 2021-06-19 LAB — POCT INFLUENZA A/B: Influenza A, POC: NEGATIVE

## 2021-06-19 MED ORDER — LEVOFLOXACIN 500 MG PO TABS: 500.0000 mg | ORAL_TABLET | Freq: Every day | ORAL | 0 refills | Status: DC

## 2021-06-19 MED ORDER — PROMETHAZINE HCL 6.25 MG/5ML PO SYRP: 12.5000 mg | ORAL_SOLUTION | Freq: Three times a day (TID) | ORAL | 0 refills | Status: DC | PRN

## 2021-06-19 NOTE — Telephone Encounter (Signed)
Can you please advise?

## 2021-06-19 NOTE — Progress Notes (Signed)
Established patient visit   Patient: Wendy Hubbard   DOB: [birth year]   66 y.o. Female  MRN: [id number] Visit Date: 06/19/2021  Chief Complaint  Patient presents with   Cough   Subjective    The patient has taken two home tests for COVID 19 and both were negative .  Cough This is a recurrent problem. The current episode started in the past 7 days. The problem has been gradually worsening. The problem occurs every few minutes. The cough is Non-productive. Associated symptoms include chest pain, ear congestion, ear pain, headaches, nasal congestion, postnasal drip, rhinorrhea, a sore throat and shortness of breath. Pertinent negatives include no chills, fever, myalgias, rash or wheezing. Nothing aggravates the symptoms. She has tried OTC cough suppressant (the patient has been taking mucinex without relief of symptoms.) for the symptoms. The treatment provided no relief. There is no history of environmental allergies.     Medications: Outpatient Medications Prior to Visit  Medication Sig   alosetron (LOTRONEX) 1 MG tablet Take 1 tablet (1 mg total) by mouth 2 (two) times daily.   ALPRAZolam (XANAX) 0.5 MG tablet TAKE 1/2 TO 1 TABLET BY MOUTH ONCE DAILYAS NEEDED FOR ACUTE ANXIETY   buPROPion ER (WELLBUTRIN SR) 100 MG 12 hr tablet Take 1 tablet (100 mg total) by mouth 2 (two) times daily.   butalbital-acetaminophen-caffeine (FIORICET) 50-325-40 MG tablet Take 1-2 tablets by mouth every 6 (six) hours as needed for headache.   cilostazol (PLETAL) 100 MG tablet Take 1 tablet (100 mg total) by mouth 2 (two) times daily.   diphenoxylate-atropine (LOMOTIL) 2.5-0.025 MG tablet Take 1 tablet by mouth 4 (four) times daily as needed for diarrhea or loose stools.   escitalopram (LEXAPRO) 10 MG tablet Take 1 tablet (10 mg total) by mouth daily.   fluticasone (FLONASE) 50 MCG/ACT nasal spray One spray in each nostril twice a day   furosemide (LASIX) 20 MG tablet TAKE 1 TABLET DAILY AS NEEDED FOR EDEMA    gabapentin (NEURONTIN) 600 MG tablet Take one AM, one after lunch, and 2 QHS   HYDROcodone-acetaminophen (NORCO) 10-325 MG tablet Take 1 tablet by mouth 2 (two) times daily as needed.   loratadine (CLARITIN) 10 MG tablet Take 10 mg by mouth daily as needed for allergies.   metoprolol succinate (TOPROL-XL) 50 MG 24 hr tablet TAKE 1 and 1/2 TABLETS BY MOUTH AT BEDTIME   mirtazapine (REMERON) 15 MG tablet TAKE 1 TABLET BY MOUTH AT BEDTIME FOR SLEEP   montelukast (SINGULAIR) 10 MG tablet TAKE 1 TABLET BY MOUTH AT BEDTIME   Multiple Vitamins-Minerals (BARIATRIC MULTIVITAMINS/IRON PO) Take by mouth.   pantoprazole (PROTONIX) 40 MG tablet TAKE 1 TABLET BY MOUTH ONCE DAILY   Probiotic Product (PROBIOTIC PO) Take by mouth.   rOPINIRole (REQUIP) 0.5 MG tablet TAKE 1 TABLET BY MOUTH AT BEDTIME MAY INCREASE TO 2 TABS AT BEDTIME AS NEEDED TOLERATED   rosuvastatin (CRESTOR) 10 MG tablet TAKE 1 TABLET BY MOUTH ONCE DAILY   Semaglutide (RYBELSUS) 7 MG TABS Take 7-14 mg by mouth daily.   Sod Picosulfate-Mag Ox-Cit Acd (CLENPIQ) 10-3.5-12 MG-GM -GM/160ML SOLN Take 320 mLs by mouth as directed.   tiZANidine (ZANAFLEX) 4 MG tablet ake 1 tablet po BID prn muscle spasms.   No facility-administered medications prior to visit.    Review of Systems  Constitutional:  Negative for activity change, appetite change, chills, fatigue and fever.  HENT:  Positive for ear pain, postnasal drip, rhinorrhea and sore throat. Negative for congestion, sinus  pressure, sinus pain and sneezing.   Eyes: Negative.   Respiratory:  Positive for cough and shortness of breath. Negative for chest tightness and wheezing.   Cardiovascular:  Positive for chest pain. Negative for palpitations.  Gastrointestinal:  Negative for abdominal pain, constipation, diarrhea, nausea and vomiting.  Endocrine: Negative for cold intolerance, heat intolerance, polydipsia and polyuria.  Genitourinary:  Negative for dyspareunia, dysuria, flank pain, frequency  and urgency.  Musculoskeletal:  Negative for arthralgias, back pain and myalgias.  Skin:  Negative for rash.  Allergic/Immunologic: Negative for environmental allergies.  Neurological:  Positive for headaches. Negative for dizziness and weakness.  Hematological:  Negative for adenopathy.  Psychiatric/Behavioral:  The patient is not nervous/anxious.     Objective     Today's Vitals   06/19/21 1603  BP: 119/73  Pulse: 93  Temp: 98.5 F (36.9 C)  SpO2: 97%  Weight: 225 lb (102.1 kg)  Height: 5\' 4"  (1.626 m)   Body mass index is 38.62 kg/m.   Physical Exam Vitals and nursing note reviewed.  Constitutional:      Appearance: Normal appearance. She is well-developed. She is obese. She is ill-appearing.  HENT:     Head: Normocephalic and atraumatic.     Right Ear: Tympanic membrane is erythematous and bulging.     Left Ear: Tympanic membrane is erythematous and bulging.     Nose: Congestion and rhinorrhea present. Rhinorrhea is purulent.     Right Sinus: Maxillary sinus tenderness and frontal sinus tenderness present.     Left Sinus: Maxillary sinus tenderness and frontal sinus tenderness present.     Mouth/Throat:     Pharynx: Posterior oropharyngeal erythema present.  Eyes:     Pupils: Pupils are equal, round, and reactive to light.  Cardiovascular:     Rate and Rhythm: Normal rate and regular rhythm.     Pulses: Normal pulses.     Heart sounds: Normal heart sounds.  Pulmonary:     Effort: Pulmonary effort is normal.     Breath sounds: Wheezing present.     Comments: Dry, harsh cough noted.  Abdominal:     Palpations: Abdomen is soft.  Musculoskeletal:        General: Normal range of motion.     Cervical back: Normal range of motion and neck supple.  Lymphadenopathy:     Cervical: Cervical adenopathy present.  Skin:    General: Skin is warm and dry.     Capillary Refill: Capillary refill takes less than 2 seconds.  Neurological:     General: No focal deficit  present.     Mental Status: She is alert and oriented to person, place, and time.  Psychiatric:        Mood and Affect: Mood normal.        Behavior: Behavior normal.        Thought Content: Thought content normal.        Judgment: Judgment normal.      Results for orders placed or performed in visit on 06/19/21  POCT Influenza A/B  Result Value Ref Range   Influenza A, POC Negative Negative   Influenza B, POC      Assessment & Plan     1. Acute recurrent pansinusitis Flu testing negaive today. Patient intolerant to multiple antibiotics. Start levofloxacin 500mg  daily for 7 days.  Rest and increase fluids. Continue using OTC medication to control symptoms.  Consider ading medrol taper if no improvement over next five days.  - levofloxacin (LEVAQUIN)  500 MG tablet; Take 1 tablet (500 mg total) by mouth daily.  Dispense: 7 tablet; Refill: 0 - methylPREDNISolone (MEDROL) 4 MG TBPK tablet; Take by mouth as directed for 6 days  Dispense: 21 tablet; Refill: 0  2. Cough, unspecified type Promethazine syrup may be taken up to three times daily as needed for cough.  - POCT Influenza A/B - promethazine (PHENERGAN) 6.25 MG/5ML syrup; Take 10 mLs (12.5 mg total) by mouth every 8 (eight) hours as needed for nausea or vomiting.  Dispense: 120 mL; Refill: 0 - methylPREDNISolone (MEDROL) 4 MG TBPK tablet; Take by mouth as directed for 6 days  Dispense: 21 tablet; Refill: 0   Return for as scheduled.        Ronnell Freshwater, NP  [facility] at [facility] [phone] (phone) [phone] (fax)  [place]

## 2021-06-24 ENCOUNTER — Ambulatory Visit (INDEPENDENT_AMBULATORY_CARE_PROVIDER_SITE_OTHER): Payer: BC Managed Care – PPO | Admitting: Nurse Practitioner

## 2021-06-24 ENCOUNTER — Ambulatory Visit
Admission: RE | Admit: 2021-06-24 | Discharge: 2021-06-24 | Disposition: A | Discharge status: Home / Self Care | Payer: BC Managed Care – PPO | Attending: Nurse Practitioner | Admitting: Nurse Practitioner

## 2021-06-24 ENCOUNTER — Ambulatory Visit
Admission: RE | Admit: 2021-06-24 | Discharge: 2021-06-24 | Disposition: A | Discharge status: Home / Self Care | Payer: BC Managed Care – PPO | Source: Ambulatory Visit | Attending: Nurse Practitioner | Admitting: Nurse Practitioner

## 2021-06-24 ENCOUNTER — Other Ambulatory Visit: Payer: Self-pay

## 2021-06-24 ENCOUNTER — Encounter: Payer: Self-pay | Admitting: Nurse Practitioner

## 2021-06-24 VITALS — BP 109/65 | HR 83 | Temp 97.8°F | Ht 64.0 in | Wt 225.7 lb

## 2021-06-24 DIAGNOSIS — R062 Wheezing: Secondary | ICD-10-CM | POA: Diagnosis not present

## 2021-06-24 DIAGNOSIS — J9811 Atelectasis: Secondary | ICD-10-CM | POA: Diagnosis not present

## 2021-06-24 DIAGNOSIS — J449 Chronic obstructive pulmonary disease, unspecified: Secondary | ICD-10-CM | POA: Diagnosis not present

## 2021-06-24 DIAGNOSIS — R059 Cough, unspecified: Secondary | ICD-10-CM | POA: Insufficient documentation

## 2021-06-24 DIAGNOSIS — R0602 Shortness of breath: Secondary | ICD-10-CM | POA: Diagnosis not present

## 2021-06-24 DIAGNOSIS — J209 Acute bronchitis, unspecified: Secondary | ICD-10-CM

## 2021-06-24 MED ORDER — METHYLPREDNISOLONE 4 MG PO TBPK: ORAL_TABLET | ORAL | 0 refills | Status: DC

## 2021-06-24 MED ORDER — IPRATROPIUM-ALBUTEROL 0.5-2.5 (3) MG/3ML IN SOLN: 3.0000 mL | RESPIRATORY_TRACT | 3 refills | Status: DC | PRN

## 2021-06-24 MED ORDER — HYDROCOD POLI-CHLORPHE POLI ER 10-8 MG/5ML PO SUER: 5.0000 mL | Freq: Two times a day (BID) | ORAL | 0 refills | Status: DC | PRN

## 2021-06-24 NOTE — Progress Notes (Signed)
Established patient visit   Patient: Wendy Hubbard   DOB: [birth year]   66 y.o. Female  MRN: [id number] Visit Date: 06/24/2021  Chief Complaint  Patient presents with   Cough   Subjective    The patient was seen on 06/18/2021 and treated for these symptoms. She was treated wit hlevofloxacin 500 mg daily and promethazine syrup to help with cough. Today, she is really feeling no better. She is coughing nearly non stop. Cough is no deep and sometimes productive. She has noted wheezing. She feels very fatigued and is finding it hard to do anything due to lack of energy and motivation.   Cough This is a recurrent problem. The current episode started 1 to 4 weeks ago. The problem has been gradually worsening. The problem occurs every few minutes. The cough is Productive of sputum. Associated symptoms include chest pain, headaches, myalgias, postnasal drip, rhinorrhea, a sore throat, shortness of breath and wheezing. Pertinent negatives include no chills, fever or rash. The symptoms are aggravated by lying down. She has tried body position changes and prescription cough suppressant (antibiotics) for the symptoms. The treatment provided mild relief. Her past medical history is significant for asthma and environmental allergies.     Medications: Outpatient Medications Prior to Visit  Medication Sig   alosetron (LOTRONEX) 1 MG tablet Take 1 tablet (1 mg total) by mouth 2 (two) times daily.   ALPRAZolam (XANAX) 0.5 MG tablet TAKE 1/2 TO 1 TABLET BY MOUTH ONCE DAILYAS NEEDED FOR ACUTE ANXIETY   buPROPion ER (WELLBUTRIN SR) 100 MG 12 hr tablet Take 1 tablet (100 mg total) by mouth 2 (two) times daily.   butalbital-acetaminophen-caffeine (FIORICET) 50-325-40 MG tablet Take 1-2 tablets by mouth every 6 (six) hours as needed for headache.   cilostazol (PLETAL) 100 MG tablet Take 1 tablet (100 mg total) by mouth 2 (two) times daily.   diphenoxylate-atropine (LOMOTIL) 2.5-0.025 MG tablet Take 1 tablet by mouth 4  (four) times daily as needed for diarrhea or loose stools.   escitalopram (LEXAPRO) 10 MG tablet Take 1 tablet (10 mg total) by mouth daily.   finasteride (PROSCAR) 5 MG tablet Take by mouth.   fluticasone (FLONASE) 50 MCG/ACT nasal spray One spray in each nostril twice a day   furosemide (LASIX) 20 MG tablet TAKE 1 TABLET DAILY AS NEEDED FOR EDEMA   gabapentin (NEURONTIN) 600 MG tablet Take one AM, one after lunch, and 2 QHS   HYDROcodone-acetaminophen (NORCO) 10-325 MG tablet Take 1 tablet by mouth 2 (two) times daily as needed.   levofloxacin (LEVAQUIN) 500 MG tablet Take 1 tablet (500 mg total) by mouth daily.   loratadine (CLARITIN) 10 MG tablet Take 10 mg by mouth daily as needed for allergies.   methylPREDNISolone (MEDROL) 4 MG TBPK tablet Take by mouth as directed for 6 days   metoprolol succinate (TOPROL-XL) 50 MG 24 hr tablet TAKE 1 and 1/2 TABLETS BY MOUTH AT BEDTIME   mirtazapine (REMERON) 15 MG tablet TAKE 1 TABLET BY MOUTH AT BEDTIME FOR SLEEP   montelukast (SINGULAIR) 10 MG tablet TAKE 1 TABLET BY MOUTH AT BEDTIME   Multiple Vitamins-Minerals (BARIATRIC MULTIVITAMINS/IRON PO) Take by mouth.   pantoprazole (PROTONIX) 40 MG tablet TAKE 1 TABLET BY MOUTH ONCE DAILY   Probiotic Product (PROBIOTIC PO) Take by mouth.   promethazine (PHENERGAN) 6.25 MG/5ML syrup Take 10 mLs (12.5 mg total) by mouth every 8 (eight) hours as needed for nausea or vomiting.   rOPINIRole (REQUIP) 0.5 MG tablet TAKE  1 TABLET BY MOUTH AT BEDTIME MAY INCREASE TO 2 TABS AT BEDTIME AS NEEDED TOLERATED   rosuvastatin (CRESTOR) 10 MG tablet TAKE 1 TABLET BY MOUTH ONCE DAILY   Semaglutide (RYBELSUS) 7 MG TABS Take 7-14 mg by mouth daily.   Sod Picosulfate-Mag Ox-Cit Acd (CLENPIQ) 10-3.5-12 MG-GM -GM/160ML SOLN Take 320 mLs by mouth as directed.   tiZANidine (ZANAFLEX) 4 MG tablet ake 1 tablet po BID prn muscle spasms.   No facility-administered medications prior to visit.    Review of Systems  Constitutional:   Positive for activity change and fatigue. Negative for appetite change, chills and fever.  HENT:  Positive for congestion, postnasal drip, rhinorrhea, sinus pressure and sore throat. Negative for sinus pain and sneezing.   Eyes: Negative.   Respiratory:  Positive for cough, chest tightness, shortness of breath and wheezing.   Cardiovascular:  Positive for chest pain. Negative for palpitations.  Gastrointestinal:  Positive for nausea. Negative for abdominal pain, constipation, diarrhea and vomiting.  Endocrine: Negative for cold intolerance, heat intolerance, polydipsia and polyuria.  Genitourinary:  Negative for dyspareunia, dysuria, flank pain, frequency and urgency.  Musculoskeletal:  Positive for myalgias. Negative for arthralgias and back pain.  Skin:  Negative for rash.  Allergic/Immunologic: Positive for environmental allergies.  Neurological:  Positive for headaches. Negative for dizziness and weakness.  Hematological:  Positive for adenopathy.  Psychiatric/Behavioral:  The patient is not nervous/anxious.     Objective     Today's Vitals   06/24/21 1306  BP: 109/65  Pulse: 83  Temp: 97.8 F (36.6 C)  SpO2: 95%  Weight: 225 lb 11.2 oz (102.4 kg)   Body mass index is 38.74 kg/m.   Physical Exam Vitals and nursing note reviewed.  Constitutional:      Appearance: Normal appearance. She is well-developed. She is obese. She is ill-appearing.  HENT:     Head: Normocephalic and atraumatic.     Nose: Nose normal.     Mouth/Throat:     Mouth: Mucous membranes are moist.     Pharynx: Oropharynx is clear.  Eyes:     Extraocular Movements: Extraocular movements intact.     Conjunctiva/sclera: Conjunctivae normal.     Pupils: Pupils are equal, round, and reactive to light.  Cardiovascular:     Rate and Rhythm: Regular rhythm. Tachycardia present.     Pulses: Normal pulses.     Heart sounds: Normal heart sounds.  Pulmonary:     Effort: Pulmonary effort is normal.     Breath  sounds: Wheezing and rhonchi present.     Comments: Deep, loose sounding, non productive cough present.  Abdominal:     Palpations: Abdomen is soft.  Musculoskeletal:        General: Normal range of motion.     Cervical back: Normal range of motion and neck supple.  Lymphadenopathy:     Cervical: No cervical adenopathy.  Skin:    General: Skin is warm and dry.     Capillary Refill: Capillary refill takes less than 2 seconds.  Neurological:     General: No focal deficit present.     Mental Status: She is alert and oriented to person, place, and time.  Psychiatric:        Mood and Affect: Mood normal.        Behavior: Behavior normal.        Thought Content: Thought content normal.        Judgment: Judgment normal.      Assessment &  Plan     1. Acute bronchitis, unspecified organism Patient is already on levofloxacin 500mg  daily. She should finish antibiotic treatment. Added medrol taper this morning. Will get chest x-ray for further evaluation.   2. Cough, unspecified type Add duoneb nebulizer solution. Patient does have her own nebulizer at home. May use up to four times daily as needed for wheezing and cough. Will get chest x-ray to evaluate for pneumonia. Prescribed tussionex cough suppressant to take twice daily as needed for cough. Recommended she use at night and when at home only, as this medication can cause significant drowsiness and dissiness.   - ipratropium-albuterol (DUONEB) 0.5-2.5 (3) MG/3ML SOLN; Take 3 mLs by nebulization every 4 (four) hours as needed.  Dispense: 150 mL; Refill: 3 - DG Chest 2 View; Future - chlorpheniramine-HYDROcodone (TUSSIONEX PENNKINETIC ER) 10-8 MG/5ML; Take 5 mLs by mouth every 12 (twelve) hours as needed for cough.  Dispense: 115 mL; Refill: 0  3. Wheezing Add duoneb nebulizer solution. Patient does have her own nebulizer at home. May use up to four times daily as needed for wheezing and cough. Will get chest x-ray to evaluate for  pneumonia.  - ipratropium-albuterol (DUONEB) 0.5-2.5 (3) MG/3ML SOLN; Take 3 mLs by nebulization every 4 (four) hours as needed.  Dispense: 150 mL; Refill: 3 - DG Chest 2 View; Future   Return for prn worsening or persistent symptoms.        Ronnell Freshwater, NP  [facility] at [facility] [phone] (phone) [phone] (fax)  [place]

## 2021-06-25 ENCOUNTER — Encounter: Payer: Self-pay | Admitting: Nurse Practitioner

## 2021-06-25 ENCOUNTER — Other Ambulatory Visit: Payer: Self-pay | Admitting: Nurse Practitioner

## 2021-06-25 DIAGNOSIS — J0141 Acute recurrent pansinusitis: Secondary | ICD-10-CM

## 2021-06-25 MED ORDER — LEVOFLOXACIN 500 MG PO TABS: 500.0000 mg | ORAL_TABLET | Freq: Every day | ORAL | 0 refills | Status: DC

## 2021-06-25 NOTE — Progress Notes (Signed)
MyChart message sent to patient regarding chest x-ray results. Extended levofloxacin dosing additional 5 days. Advised her to continue prn medications as needed and as prescribed.

## 2021-07-02 DIAGNOSIS — E569 Vitamin deficiency, unspecified: Secondary | ICD-10-CM | POA: Diagnosis not present

## 2021-07-02 DIAGNOSIS — R5382 Chronic fatigue, unspecified: Secondary | ICD-10-CM | POA: Diagnosis not present

## 2021-07-02 DIAGNOSIS — Z9884 Bariatric surgery status: Secondary | ICD-10-CM | POA: Diagnosis not present

## 2021-07-02 DIAGNOSIS — K316 Fistula of stomach and duodenum: Secondary | ICD-10-CM | POA: Diagnosis not present

## 2021-07-02 DIAGNOSIS — R7303 Prediabetes: Secondary | ICD-10-CM | POA: Diagnosis not present

## 2021-07-07 ENCOUNTER — Other Ambulatory Visit: Payer: Self-pay | Admitting: Nurse Practitioner

## 2021-07-07 ENCOUNTER — Telehealth: Payer: Self-pay | Admitting: Nurse Practitioner

## 2021-07-07 DIAGNOSIS — M792 Neuralgia and neuritis, unspecified: Secondary | ICD-10-CM

## 2021-07-07 MED ORDER — HYDROCODONE-ACETAMINOPHEN 10-325 MG PO TABS: 1.0000 | ORAL_TABLET | Freq: Two times a day (BID) | ORAL | 0 refills | Status: DC | PRN

## 2021-07-07 NOTE — Telephone Encounter (Signed)
Renewed her rx for hydrocodone/APAP and sent to [facility].

## 2021-07-07 NOTE — Telephone Encounter (Signed)
Patient requesting a refill of pain medication. Please advise. [phone]

## 2021-07-07 NOTE — Progress Notes (Signed)
Renewed her rx for hydrocodone/APAP and sent to [facility].

## 2021-07-08 NOTE — Telephone Encounter (Signed)
Called pt LVM stating that her Rx was sent to [facility]

## 2021-07-11 ENCOUNTER — Ambulatory Visit: Payer: BC Managed Care – PPO | Admitting: Neurology

## 2021-07-22 ENCOUNTER — Ambulatory Visit: Payer: BC Managed Care – PPO

## 2021-07-22 ENCOUNTER — Ambulatory Visit: Payer: BC Managed Care – PPO | Admitting: Nurse Practitioner

## 2021-07-24 ENCOUNTER — Encounter: Payer: Self-pay | Admitting: Nurse Practitioner

## 2021-07-24 ENCOUNTER — Ambulatory Visit (INDEPENDENT_AMBULATORY_CARE_PROVIDER_SITE_OTHER): Payer: Medicare Other | Admitting: Nurse Practitioner

## 2021-07-24 ENCOUNTER — Other Ambulatory Visit: Payer: Self-pay

## 2021-07-24 VITALS — BP 115/73 | HR 75 | Temp 98.2°F | Ht 64.0 in | Wt 226.7 lb

## 2021-07-24 DIAGNOSIS — E1165 Type 2 diabetes mellitus with hyperglycemia: Secondary | ICD-10-CM

## 2021-07-24 DIAGNOSIS — F331 Major depressive disorder, recurrent, moderate: Secondary | ICD-10-CM

## 2021-07-24 DIAGNOSIS — M792 Neuralgia and neuritis, unspecified: Secondary | ICD-10-CM | POA: Diagnosis not present

## 2021-07-24 DIAGNOSIS — Z23 Encounter for immunization: Secondary | ICD-10-CM | POA: Diagnosis not present

## 2021-07-24 DIAGNOSIS — Z6838 Body mass index (BMI) 38.0-38.9, adult: Secondary | ICD-10-CM

## 2021-07-24 LAB — POCT GLYCOSYLATED HEMOGLOBIN (HGB A1C): Hemoglobin A1C: 6.2 % — AB (ref 4.0–5.6)

## 2021-07-24 MED ORDER — HYDROCODONE-ACETAMINOPHEN 10-325 MG PO TABS: 1.0000 | ORAL_TABLET | Freq: Two times a day (BID) | ORAL | 0 refills | Status: DC | PRN

## 2021-07-24 NOTE — Progress Notes (Signed)
Established patient visit ? ? ?Patient: Wendy Hubbard   DOB: 08-Jul-1955   66 y.o. Female  MRN: [id number] ?Visit Date: 07/24/2021 ? ? ?Chief Complaint  ?Patient presents with  ? Diabetes  ? Hypertension  ? ?Subjective  ?  ?HPI  ?Patient here for routine follow up ?-blood sugars doing well. HgbA1c 6.2 today.  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  ?-blood pressure well managed.  ?-has been cutting back gabapentin. Is now on '600mg'$  twice daily. Would like to continue cutting this back.  ?-does have chronic pain in her feet. Severe neuropathy.  ? ? ?Medications: ?Outpatient Medications Prior to Visit  ?Medication Sig  ? alosetron (LOTRONEX) 1 MG tablet Take 1 tablet (1 mg total) by mouth 2 (two) times daily.  ? ALPRAZolam (XANAX) 0.5 MG tablet TAKE 1/2 TO 1 TABLET BY MOUTH ONCE DAILYAS NEEDED FOR ACUTE ANXIETY  ? buPROPion ER (WELLBUTRIN SR) 100 MG 12 hr tablet Take 1 tablet (100 mg total) by mouth 2 (two) times daily.  ? butalbital-acetaminophen-caffeine (FIORICET) 50-325-40 MG tablet Take 1-2 tablets by mouth every 6 (six) hours as needed for headache.  ? cilostazol (PLETAL) 100 MG tablet Take 1 tablet (100 mg total) by mouth 2 (two) times daily.  ? diphenoxylate-atropine (LOMOTIL) 2.5-0.025 MG tablet Take 1 tablet by mouth 4 (four) times daily as needed for diarrhea or loose stools.  ? finasteride (PROSCAR) 5 MG tablet Take by mouth.  ? fluticasone (FLONASE) 50 MCG/ACT nasal spray One spray in each nostril twice a day  ? furosemide (LASIX) 20 MG tablet TAKE 1 TABLET DAILY AS NEEDED FOR EDEMA  ? levofloxacin (LEVAQUIN) 500 MG tablet Take 1 tablet (500 mg total) by mouth daily.  ? loratadine (CLARITIN) 10 MG tablet Take 10 mg by mouth daily as needed for allergies.  ? metoprolol succinate (TOPROL-XL) 50 MG 24 hr tablet TAKE 1 and 1/2 TABLETS BY MOUTH AT BEDTIME  ? mirtazapine (REMERON) 15 MG tablet TAKE 1 TABLET BY MOUTH AT BEDTIME FOR SLEEP  ? montelukast (SINGULAIR) 10 MG tablet  TAKE 1 TABLET BY MOUTH AT BEDTIME  ? Multiple Vitamins-Minerals (BARIATRIC MULTIVITAMINS/IRON PO) Take by mouth.  ? pantoprazole (PROTONIX) 40 MG tablet TAKE 1 TABLET BY MOUTH ONCE DAILY  ? Probiotic Product (PROBIOTIC PO) Take by mouth.  ? rOPINIRole (REQUIP) 0.5 MG tablet TAKE 1 TABLET BY MOUTH AT BEDTIME MAY INCREASE TO 2 TABS AT BEDTIME AS NEEDED TOLERATED  ? rosuvastatin (CRESTOR) 10 MG tablet TAKE 1 TABLET BY MOUTH ONCE DAILY  ? Semaglutide (RYBELSUS) 7 MG TABS Take 7-14 mg by mouth daily.  ? tiZANidine (ZANAFLEX) 4 MG tablet ake 1 tablet po BID prn muscle spasms.  ? [DISCONTINUED] chlorpheniramine-HYDROcodone (TUSSIONEX PENNKINETIC ER) 10-8 MG/5ML Take 5 mLs by mouth every 12 (twelve) hours as needed for cough.  ? [DISCONTINUED] escitalopram (LEXAPRO) 10 MG tablet Take 1 tablet (10 mg total) by mouth daily.  ? [DISCONTINUED] HYDROcodone-acetaminophen (NORCO) 10-325 MG tablet Take 1 tablet by mouth 2 (two) times daily as needed.  ? gabapentin (NEURONTIN) 600 MG tablet Take one AM, one after lunch, and 2 QHS (Patient not taking: Reported on 07/24/2021)  ? ipratropium-albuterol (DUONEB) 0.5-2.5 (3) MG/3ML SOLN Take 3 mLs by nebulization every 4 (four) hours as needed.  ? methylPREDNISolone (MEDROL) 4 MG TBPK tablet Take by mouth as directed for 6 days (Patient not taking: Reported on 07/24/2021)  ? promethazine (PHENERGAN) 6.25 MG/5ML syrup Take 10 mLs (12.5 mg total) by mouth  every 8 (eight) hours as needed for nausea or vomiting. (Patient not taking: Reported on 07/24/2021)  ? Sod Picosulfate-Mag Ox-Cit Acd (CLENPIQ) 10-3.5-12 MG-GM -GM/160ML SOLN Take 320 mLs by mouth as directed. (Patient not taking: Reported on 07/24/2021)  ? ?No facility-administered medications prior to visit.  ? ? ?Review of Systems  ?Constitutional:  Positive for fatigue. Negative for activity change, appetite change, chills and fever.  ?HENT:  Negative for congestion, postnasal drip, rhinorrhea, sinus pressure, sinus pain, sneezing and  sore throat.   ?Eyes: Negative.   ?Respiratory:  Negative for cough, chest tightness, shortness of breath and wheezing.   ?Cardiovascular:  Negative for chest pain and palpitations.  ?Gastrointestinal:  Positive for abdominal pain and diarrhea. Negative for constipation, nausea and vomiting.  ?     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   ?Endocrine: Negative for cold intolerance, heat intolerance, polydipsia and polyuria.  ?Genitourinary:  Negative for dyspareunia, dysuria, flank pain, frequency and urgency.  ?Musculoskeletal:  Positive for arthralgias and myalgias. Negative for back pain.  ?     Severe pain in both feet.   ?Skin:  Negative for rash.  ?Allergic/Immunologic: Negative for environmental allergies.  ?Neurological:  Negative for dizziness, weakness and headaches.  ?        Severe numbness and neuropathy in both feet and lower legs.   ?Hematological:  Negative for adenopathy.  ?Psychiatric/Behavioral:  The patient is not nervous/anxious.   ? ? ? ? Objective  ?  ? ?Today's Vitals  ? 07/24/21 1059  ?BP: 115/73  ?Pulse: 75  ?Temp: 98.2 ?F (36.8 ?C)  ?SpO2: 95%  ?Weight: 226 lb 11.2 oz (102.8 kg)  ?Height: '5\' 4"'$  (1.626 m)  ? ?Body mass index is 38.91 kg/m?.  ? ?BP Readings from Last 3 Encounters:  ?07/24/21 115/73  ?06/24/21 109/65  ?06/19/21 119/73  ?  ?Wt Readings from Last 3 Encounters:  ?07/24/21 226 lb 11.2 oz (102.8 kg)  ?06/24/21 225 lb 11.2 oz (102.4 kg)  ?06/19/21 225 lb (102.1 kg)  ?  ?Physical Exam ?Vitals and nursing note reviewed.  ?Constitutional:   ?   Appearance: Normal appearance. She is well-developed. She is obese.  ?HENT:  ?   Head: Normocephalic and atraumatic.  ?   Nose: Nose normal.  ?   Mouth/Throat:  ?   Mouth: Mucous membranes are moist.  ?   Pharynx: Oropharynx is clear.  ?Eyes:  ?   Extraocular Movements: Extraocular movements intact.  ?   Conjunctiva/sclera: Conjunctivae normal.  ?   Pupils: Pupils are equal, round, and reactive to light.   ?Cardiovascular:  ?   Rate and Rhythm: Normal rate and regular rhythm.  ?   Pulses: Normal pulses.  ?   Heart sounds: Normal heart sounds.  ?Pulmonary:  ?   Effort: Pulmonary effort is normal.  ?   Breath sounds: Normal breath sounds.  ?Abdominal:  ?   Palpations: Abdomen is soft.  ?Musculoskeletal:     ?   General: Normal range of motion.  ?   Cervical back: Normal range of motion and neck supple.  ?   Comments: Severe pain in both feet without palpable abnormalities or deformities noted   ?Lymphadenopathy:  ?   Cervical: No cervical adenopathy.  ?Skin: ?   General: Skin is warm and dry.  ?   Capillary Refill: Capillary refill takes less than 2 seconds.  ?Neurological:  ?   General: No focal deficit present.  ?  Mental Status: She is alert and oriented to person, place, and time.  ?Psychiatric:     ?   Mood and Affect: Mood normal.     ?   Behavior: Behavior normal.     ?   Thought Content: Thought content normal.     ?   Judgment: Judgment normal.  ?   Comments: Improved depression and anxiety .  ?  ? ?Results for orders placed or performed in visit on 07/24/21  ?POCT glycosylated hemoglobin (Hb A1C)  ?Result Value Ref Range  ? Hemoglobin A1C 6.2 (A) 4.0 - 5.6 %  ? HbA1c POC (<> result, manual entry)    ? HbA1c, POC (prediabetic range)    ? HbA1c, POC (controlled diabetic range)    ? ? Assessment & Plan  ?  ?1. Uncontrolled type 2 diabetes mellitus with hyperglycemia ([place]) ?HgbA1c 6.2 today. Continue rybelsus '7mg'$  daily. Limit intake of sugar and carbohydrates to keep blood sugars low.  ?- POCT glycosylated hemoglobin (Hb A1C) ? ?2. Peripheral neuropathic pain ?The patient may continue to take hydrocodone/APAP 10/'325mg'$  tablets up to twice daily as needed for severe pain. She is weaning down gabapentin. She should also continue requip and pletal as prescribed.  ?- HYDROcodone-acetaminophen (NORCO) 10-325 MG tablet; Take 1 tablet by mouth 2 (two) times daily as needed.  Dispense: 45 tablet; Refill: 0 ? ?3. Body  mass index (BMI) of 38.0-38.9 in adult ?Discussed lowering calorie intake to 1500 calories per day and incorporating exercise into daily routine to help lose weight.  ? ?4. Moderate episode of recurrent major depre

## 2021-07-29 NOTE — Progress Notes (Signed)
Sent message, via epic in basket, requesting orders in epic from surgeon.  

## 2021-07-31 ENCOUNTER — Ambulatory Visit: Payer: Self-pay | Admitting: General Surgery

## 2021-07-31 DIAGNOSIS — K316 Fistula of stomach and duodenum: Secondary | ICD-10-CM

## 2021-07-31 NOTE — Progress Notes (Addendum)
PCP - Leretha Pol, NP ?Cardiologist - no ? ?PPM/ICD -  ?Device Orders -  ?Rep Notified -  ? ?Chest x-ray - 06-24-21 epic ?EKG -  ?Stress Test -  ?ECHO -  ?Cardiac Cath -  ?HgbA1c- 07-24-21 epic 6.2 ? ?Sleep Study -  ?CPAP -  ? ?Fasting Blood Sugar - 95-100 ?Checks Blood Sugar __1 a week___ ? ?Blood Thinner Instructions:  Pletal  Spoke with triage at [facility] they are to let pt. Know instructions ASAP Dr. Kieth Brightly was not in office ?Aspirin Instructions: ? ?ERAS Protcol - ?PRE-SURGERY  G2-  ? ?COVID vaccine -x3 pfizer ? ?Activity--Able to complete ADL's without SOB and CP ? ?Anesthesia review: HTN, COPD, DM, OSA no longer after wt. Loss surgery ? ?Patient denies shortness of breath, fever, cough and chest pain at PAT appointment ? ? ?All instructions explained to the patient, with a verbal understanding of the material. Patient agrees to go over the instructions while at home for a better understanding. Patient also instructed to self quarantine after being tested for COVID-19. The opportunity to ask questions was provided. ?  ?

## 2021-07-31 NOTE — Patient Instructions (Addendum)
DUE TO COVID-19 ONLY ONE VISITOR  (aged 66 and older)  IS ALLOWED TO COME WITH YOU AND STAY IN THE WAITING ROOM ONLY DURING PRE OP AND PROCEDURE.   ?**NO VISITORS ARE ALLOWED IN THE SHORT STAY AREA OR RECOVERY ROOM!!** ? ?IF YOU WILL BE ADMITTED INTO THE HOSPITAL YOU ARE ALLOWED ONLY TWO SUPPORT PEOPLE DURING VISITATION HOURS ONLY (7 AM -8PM)   ?The support person(s) must pass our screening, gel in and out, and wear a mask at all times, including in the patient?s room. ?Patients must also wear a mask when staff or their support person are in the room. ?Visitors GUEST BADGE MUST BE WORN VISIBLY  ?One adult visitor may remain with you overnight and MUST be in the room by 8 P.M. ?  ? ? Your procedure is scheduled on: 08-25-21 ? ? Report to [facility] Main Entrance ? ?  Report to admitting at     [place]  AM ? ? Call this number if you have problems the morning of surgery [phone] ? ? Do not eat food :After Midnight. ? ? After Midnight you may have the following liquids until __0430 ____ AM DAY OF SURGERY ? ?Water ?Black Coffee (sugar ok, NO MILK/CREAM OR CREAMERS)  ?Tea (sugar ok, NO MILK/CREAM OR CREAMERS) regular and decaf                             ?Plain Jell-O (NO RED)                                           ?Fruit ices (not with fruit pulp, NO RED)                                     ?Popsicles (NO RED)                                                                  ?Juice: apple, WHITE grape, WHITE cranberry ?Sports drinks like Gatorade (NO RED) ?Clear broth(vegetable,chicken,beef) ? ?             ?  ? ?  ?  ?The day of surgery:  ?Drink ONE (1) Pre-Surgery G2 at       0430  AM the morning of surgery. Drink in one sitting. Do not sip.  ?This drink was given to you during your hospital  ?pre-op appointment visit. ?Nothing else to drink after completing the  ?Pre-Surgery G2. ?  ?       If you have questions, please contact your surgeon?s office. ? ? ?FOLLOW BOWEL PREP AND ANY ADDITIONAL PRE OP  INSTRUCTIONS YOU RECEIVED FROM YOUR SURGEON'S OFFICE!!! ?  ?  ?Oral Hygiene is also important to reduce your risk of infection.                                    ?Remember - BRUSH YOUR TEETH THE MORNING OF SURGERY WITH YOUR REGULAR  TOOTHPASTE ? ? Do NOT smoke after Midnight ? ? Take these medicines the morning of surgery with A SIP OF WATER: claritin, wellbutrin, finestaride, xanax if needed ? ?DO NOT TAKE ANY ORAL DIABETIC MEDICATIONS DAY OF YOUR SURGERY ? ? ?                  ?           You may not have any metal on your body including hair pins, jewelry, and body piercing ? ?           Do not wear make-up, lotions, powders, perfumes/cologne, or deodorant ? ?Do not wear nail polish including gel and S&S, artificial/acrylic nails, or any other type of covering on natural nails including finger and toenails. If you have artificial nails, gel coating, etc. that needs to be removed by a nail salon please have this removed prior to surgery or surgery may need to be canceled/ delayed if the surgeon/ anesthesia feels like they are unable to be safely monitored.  ? ?Do not shave  48 hours prior to surgery.  ? ?      ? ? Do not bring valuables to the hospital. [facility] NOT ?            RESPONSIBLE   FOR VALUABLES. ? ? Contacts, dentures or bridgework may not be worn into surgery. ? ? Bring small overnight bag day of surgery. ?  ? Patients discharged on the day of surgery will not be allowed to drive home.  Someone NEEDS to stay with you for the first 24 hours after anesthesia. ? ? Special Instructions: Bring a copy of your healthcare power of attorney and living will documents         the day of surgery if you haven't scanned them before. ? ?            Please read over the following fact sheets you were given: IF [place] [phone] ? ?   [facility] - Preparing for Surgery ?Before surgery, you can play an important role.  Because skin is not sterile, your  skin needs to be as free of germs as possible.  You can reduce the number of germs on your skin by washing with CHG (chlorahexidine gluconate) soap before surgery.  CHG is an antiseptic cleaner which kills germs and bonds with the skin to continue killing germs even after washing. ?Please DO NOT use if you have an allergy to CHG or antibacterial soaps.  If your skin becomes reddened/irritated stop using the CHG and inform your nurse when you arrive at Short Stay. ?Do not shave (including legs and underarms) for at least 48 hours prior to the first CHG shower.  You may shave your face/neck. ?Please follow these instructions carefully: ? 1.  Shower with CHG Soap the night before surgery and the  morning of Surgery. ? 2.  If you choose to wash your hair, wash your hair first as usual with your  normal  shampoo. ? 3.  After you shampoo, rinse your hair and body thoroughly to remove the  shampoo.                           4.  Use CHG as you would any other liquid soap.  You can apply chg directly  to the skin and wash  ?  Gently with a scrungie or clean washcloth. ? 5.  Apply the CHG Soap to your body ONLY FROM THE NECK DOWN.   Do not use on face/ open      ?                     Wound or open sores. Avoid contact with eyes, ears mouth and genitals (private parts).  ?                     Production manager,  Genitals (private parts) with your normal soap. ?            6.  Wash thoroughly, paying special attention to the area where your surgery  will be performed. ? 7.  Thoroughly rinse your body with warm water from the neck down. ? 8.  DO NOT shower/wash with your normal soap after using and rinsing off  the CHG Soap. ?               9.  Pat yourself dry with a clean towel. ?           10.  Wear clean pajamas. ?           11.  Place clean sheets on your bed the night of your first shower and do not  sleep with pets. ?Day of Surgery : ?Do not apply any lotions/deodorants the morning of surgery.  Please wear  clean clothes to the hospital/surgery center. ? ?FAILURE TO FOLLOW THESE INSTRUCTIONS MAY RESULT IN THE CANCELLATION OF YOUR SURGERY ?PATIENT SIGNATURE_________________________________ ? ?NURSE SIGNATURE__________________________________ ? ?________________________________________________________________________  ? ?Incentive Spirometer ? ?An incentive spirometer is a tool that can help keep your lungs clear and active. This tool measures how well you are filling your lungs with each breath. Taking Dupuy deep breaths may help reverse or decrease the chance of developing breathing (pulmonary) problems (especially infection) following: ?A Charles period of time when you are unable to move or be active. ?BEFORE THE PROCEDURE  ?If the spirometer includes an indicator to show your best effort, your nurse or respiratory therapist will set it to a desired goal. ?If possible, sit up straight or lean slightly forward. Try not to slouch. ?Hold the incentive spirometer in an upright position. ?INSTRUCTIONS FOR USE  ?Sit on the edge of your bed if possible, or sit up as far as you can in bed or on a chair. ?Hold the incentive spirometer in an upright position. ?Breathe out normally. ?Place the mouthpiece in your mouth and seal your lips tightly around it. ?Breathe in slowly and as deeply as possible, raising the piston or the ball toward the top of the column. ?Hold your breath for 3-5 seconds or for as Feher as possible. Allow the piston or ball to fall to the bottom of the column. ?Remove the mouthpiece from your mouth and breathe out normally. ?Rest for a few seconds and repeat Steps 1 through 7 at least 10 times every 1-2 hours when you are awake. Take your time and take a few normal breaths between deep breaths. ?The spirometer may include an indicator to show your best effort. Use the indicator as a goal to work toward during each repetition. ?After each set of 10 deep breaths, practice coughing to be sure your lungs are  clear. If you have an incision (the cut made at the time of surgery), support your incision when coughing by placing a pillow or rolled up towels  firmly against it. ?Once you are able to get out of bed, walk around indo

## 2021-08-01 ENCOUNTER — Other Ambulatory Visit: Payer: Self-pay | Admitting: Nurse Practitioner

## 2021-08-01 DIAGNOSIS — F3341 Major depressive disorder, recurrent, in partial remission: Secondary | ICD-10-CM

## 2021-08-03 NOTE — Patient Instructions (Signed)
Fat and Cholesterol Restricted Eating Plan Getting too much fat and cholesterol in your diet may cause health problems. Choosing the right foods helps keep your fat and cholesterol at normal levels. This can keep you from getting certain diseases. Your doctor may recommend an eating plan that includes: Total fat: ______% or less of total calories a day. This is ______g of fat a day. Saturated fat: ______% or less of total calories a day. This is ______g of saturated fat a day. Cholesterol: less than _________mg a day. Fiber: ______g a day. What are tips for following this plan? General tips Work with your doctor to lose weight if you need to. Avoid: Foods with added sugar. Fried foods. Foods with trans fat or partially hydrogenated oils. This includes some margarines and baked goods. If you drink alcohol: Limit how much you have to: 0-1 drink a day for women who are not pregnant. 0-2 drinks a day for men. Know how much alcohol is in a drink. In the [place] one drink equals one 12 oz bottle of beer (355 mL), one 5 oz glass of wine (148 mL), or one 1 oz glass of hard liquor (44 mL). Reading food labels Check food labels for: Trans fats. Partially hydrogenated oils. Saturated fat (g) in each serving. Cholesterol (mg) in each serving. Fiber (g) in each serving. Choose foods with healthy fats, such as: Monounsaturated fats and polyunsaturated fats. These include olive and canola oil, flaxseeds, walnuts, almonds, and seeds. Omega-3 fats. These are found in certain fish, flaxseed oil, and ground flaxseeds. Choose grain products that have whole grains. Look for the word "whole" as the first word in the ingredient list. Cooking Cook foods using low-fat methods. These include baking, boiling, grilling, and broiling. Eat more home-cooked foods. Eat at restaurants and buffets less often. Eat less fast food. Avoid cooking using saturated fats, such as butter, cream, palm oil, palm kernel oil, and  coconut oil. Meal planning  At meals, divide your plate into four equal parts: Fill one-half of your plate with vegetables, green salads, and fruit. Fill one-fourth of your plate with whole grains. Fill one-fourth of your plate with low-fat (lean) protein foods. Eat fish that is high in omega-3 fats at least two times a week. This includes mackerel, tuna, sardines, and salmon. Eat foods that are high in fiber, such as whole grains, beans, apples, pears, berries, broccoli, carrots, peas, and barley. What foods should I eat? Fruits All fresh, canned (in natural juice), or frozen fruits. Vegetables Fresh or frozen vegetables (raw, steamed, roasted, or grilled). Green salads. Grains Whole grains, such as whole wheat or whole grain breads, crackers, cereals, and pasta. Unsweetened oatmeal, bulgur, barley, quinoa, or brown rice. Corn or whole wheat flour tortillas. Meats and other protein foods Ground beef (85% or leaner), grass-fed beef, or beef trimmed of fat. Skinless chicken or turkey. Ground chicken or turkey. Pork trimmed of fat. All fish and seafood. Egg whites. Dried beans, peas, or lentils. Unsalted nuts or seeds. Unsalted canned beans. Nut butters without added sugar or oil. Dairy Low-fat or nonfat dairy products, such as skim or 1% milk, 2% or reduced-fat cheeses, low-fat and fat-free ricotta or cottage cheese, or plain low-fat and nonfat yogurt. Fats and oils Tub margarine without trans fats. Light or reduced-fat mayonnaise and salad dressings. Avocado. Olive, canola, sesame, or safflower oils. The items listed above may not be a complete list of foods and beverages you can eat. Contact a dietitian for more information. What foods   should I avoid? Fruits Canned fruit in heavy syrup. Fruit in cream or butter sauce. Fried fruit. Vegetables Vegetables cooked in cheese, cream, or butter sauce. Fried vegetables. Grains White bread. White pasta. White rice. Cornbread. Bagels, pastries,  and croissants. Crackers and snack foods that contain trans fat and hydrogenated oils. Meats and other protein foods Fatty cuts of meat. Ribs, chicken wings, bacon, sausage, bologna, salami, chitterlings, fatback, hot dogs, bratwurst, and packaged lunch meats. Liver and organ meats. Whole eggs and egg yolks. Chicken and turkey with skin. Fried meat. Dairy Whole or 2% milk, cream, half-and-half, and cream cheese. Whole milk cheeses. Whole-fat or sweetened yogurt. Full-fat cheeses. Nondairy creamers and whipped toppings. Processed cheese, cheese spreads, and cheese curds. Fats and oils Butter, stick margarine, lard, shortening, ghee, or bacon fat. Coconut, palm kernel, and palm oils. Beverages Alcohol. Sugar-sweetened drinks such as sodas, lemonade, and fruit drinks. Sweets and desserts Corn syrup, sugars, honey, and molasses. Candy. Jam and jelly. Syrup. Sweetened cereals. Cookies, pies, cakes, donuts, muffins, and ice cream. The items listed above may not be a complete list of foods and beverages you should avoid. Contact a dietitian for more information. Summary Choosing the right foods helps keep your fat and cholesterol at normal levels. This can keep you from getting certain diseases. At meals, fill one-half of your plate with vegetables, green salads, and fruits. Eat high fiber foods, like whole grains, beans, apples, pears, berries, carrots, peas, and barley. Limit added sugar, saturated fats, alcohol, and fried foods. This information is not intended to replace advice given to you by your health care provider. Make sure you discuss any questions you have with your health care provider. Document Revised: 09/06/2020 Document Reviewed: 09/06/2020 Elsevier Patient Education  2022 [employer]vier Inc.  

## 2021-08-12 ENCOUNTER — Encounter (HOSPITAL_COMMUNITY): Payer: Self-pay

## 2021-08-12 ENCOUNTER — Encounter (HOSPITAL_COMMUNITY)
Admission: RE | Admit: 2021-08-12 | Discharge: 2021-08-12 | Disposition: A | Discharge status: Home / Self Care | Payer: Medicare Other | Source: Ambulatory Visit | Attending: General Surgery | Admitting: General Surgery

## 2021-08-12 ENCOUNTER — Other Ambulatory Visit: Payer: Self-pay

## 2021-08-12 DIAGNOSIS — K316 Fistula of stomach and duodenum: Secondary | ICD-10-CM | POA: Diagnosis not present

## 2021-08-12 DIAGNOSIS — E119 Type 2 diabetes mellitus without complications: Secondary | ICD-10-CM | POA: Diagnosis not present

## 2021-08-12 DIAGNOSIS — Z01818 Encounter for other preprocedural examination: Secondary | ICD-10-CM | POA: Diagnosis not present

## 2021-08-12 HISTORY — DX: Pneumonia, unspecified organism: J18.9

## 2021-08-12 HISTORY — DX: Myoneural disorder, unspecified: G70.9

## 2021-08-12 LAB — CBC WITH DIFFERENTIAL/PLATELET
Abs Immature Granulocytes: 0.03 10*3/uL (ref 0.00–0.07)
Basophils Absolute: 0.1 10*3/uL (ref 0.0–0.1)
Basophils Relative: 1 %
Eosinophils Absolute: 0.3 10*3/uL (ref 0.0–0.5)
Eosinophils Relative: 4 %
HCT: 41.9 % (ref 36.0–46.0)
Hemoglobin: 13.5 g/dL (ref 12.0–15.0)
Immature Granulocytes: 0 %
Lymphocytes Relative: 33 %
Lymphs Abs: 2.6 10*3/uL (ref 0.7–4.0)
MCH: 31.5 pg (ref 26.0–34.0)
MCHC: 32.2 g/dL (ref 30.0–36.0)
MCV: 97.9 fL (ref 80.0–100.0)
Monocytes Absolute: 0.5 10*3/uL (ref 0.1–1.0)
Monocytes Relative: 6 %
Neutro Abs: 4.4 10*3/uL (ref 1.7–7.7)
Neutrophils Relative %: 56 %
Platelets: 286 10*3/uL (ref 150–400)
RBC: 4.28 MIL/uL (ref 3.87–5.11)
RDW: 12.8 % (ref 11.5–15.5)
WBC: 7.9 10*3/uL (ref 4.0–10.5)
nRBC: 0 % (ref 0.0–0.2)

## 2021-08-12 LAB — COMPREHENSIVE METABOLIC PANEL
ALT: 18 U/L (ref 0–44)
AST: 20 U/L (ref 15–41)
Albumin: 4.2 g/dL (ref 3.5–5.0)
Alkaline Phosphatase: 120 U/L (ref 38–126)
Anion gap: 4 — ABNORMAL LOW (ref 5–15)
BUN: 15 mg/dL (ref 8–23)
CO2: 28 mmol/L (ref 22–32)
Calcium: 9.5 mg/dL (ref 8.9–10.3)
Chloride: 110 mmol/L (ref 98–111)
Creatinine, Ser: 0.88 mg/dL (ref 0.44–1.00)
GFR, Estimated: >60 mL/min (ref >60)
Glucose, Bld: 121 mg/dL — ABNORMAL HIGH (ref 70–99)
Potassium: 4.8 mmol/L (ref 3.5–5.1)
Sodium: 142 mmol/L (ref 135–145)
Total Bilirubin: 0.6 mg/dL (ref 0.3–1.2)
Total Protein: 7.1 g/dL (ref 6.5–8.1)

## 2021-08-12 LAB — TYPE AND SCREEN
ABO/RH(D): O POS
Antibody Screen: NEGATIVE

## 2021-08-12 LAB — GLUCOSE, CAPILLARY: Glucose-Capillary: 123 mg/dL — ABNORMAL HIGH (ref 70–99)

## 2021-08-13 ENCOUNTER — Ambulatory Visit: Payer: Medicare Other | Admitting: Diagnostic Neuroimaging

## 2021-08-18 ENCOUNTER — Other Ambulatory Visit: Payer: Self-pay | Admitting: Nurse Practitioner

## 2021-08-18 DIAGNOSIS — M792 Neuralgia and neuritis, unspecified: Secondary | ICD-10-CM

## 2021-08-18 NOTE — Telephone Encounter (Signed)
Reviewed PDMP 08/18/2021. Last fill 07/08/2021. Will be seen for further fills.  ?

## 2021-08-22 NOTE — Anesthesia Preprocedure Evaluation (Addendum)
Anesthesia Evaluation  ?Patient identified by MRN, date of birth, ID band ?Patient awake ? ? ? ?Reviewed: ?Allergy & Precautions, NPO status , Patient's Chart, lab work & pertinent test results ? ?History of Anesthesia Complications ?Negative for: history of anesthetic complications ? ?Airway ?Mallampati: III ? ?TM Distance: >3 FB ?Neck ROM: Full ? ? ? Dental ? ?(+) Dental Advisory Given, Teeth Intact ?  ?Pulmonary ?sleep apnea , COPD, former smoker,  ?  ?Pulmonary exam normal ? ? ? ? ? ? ? Cardiovascular ?hypertension, Pt. on medications and Pt. on home beta blockers ?Normal cardiovascular exam ? ? ?  ?Neuro/Psych ?Anxiety Depression negative neurological ROS ?   ? GI/Hepatic ?Neg liver ROS, GERD  ,gastrogastric fistula s/p bariatric surgery ?  ?Endo/Other  ?diabetes, Type 2 ? Renal/GU ?negative Renal ROS  ?negative genitourinary ?  ?Musculoskeletal ? ?(+) Arthritis ,  ? Abdominal ?  ?Peds ? Hematology ?negative hematology ROS ?(+)   ?Anesthesia Other Findings ? ? Reproductive/Obstetrics ? ?  ? ? ? ? ? ? ? ? ? ? ? ? ? ?  ?  ? ? ? ? ? ? ?Anesthesia Physical ?Anesthesia Plan ? ?ASA: 3 ? ?Anesthesia Plan: General  ? ?Post-op Pain Management: Tylenol PO (pre-op)* and Toradol IV (intra-op)*  ? ?Induction: Intravenous ? ?PONV Risk Score and Plan: 4 or greater and Ondansetron, Dexamethasone, Treatment may vary due to age or medical condition, Midazolam and Scopolamine patch - Pre-op ? ?Airway Management Planned: Oral ETT ? ?Additional Equipment: None ? ?Intra-op Plan:  ? ?Post-operative Plan: Extubation in OR ? ?Informed Consent: I have reviewed the patients History and Physical, chart, labs and discussed the procedure including the risks, benefits and alternatives for the proposed anesthesia with the patient or authorized representative who has indicated his/her understanding and acceptance.  ? ? ? ?Dental advisory given ? ?Plan Discussed with:  ? ?Anesthesia Plan Comments:    ? ? ? ? ? ?Anesthesia Quick Evaluation ? ?

## 2021-08-24 MED ORDER — GENTAMICIN SULFATE 40 MG/ML IJ SOLN
5.0000 mg/kg | INTRAVENOUS | Status: AC
  Administered 2021-08-25: 367.6 mg via INTRAVENOUS
  Filled 2021-08-24 (×2): qty 9.25

## 2021-08-24 MED ORDER — GENTAMICIN SULFATE 40 MG/ML IJ SOLN
5.0000 mg/kg | INTRAVENOUS | Status: DC
  Filled 2021-08-24: qty 9.25

## 2021-08-24 MED ORDER — VANCOMYCIN HCL 1500 MG/300ML IV SOLN
1500.0000 mg | INTRAVENOUS | Status: AC
  Administered 2021-08-25: 1500 mg via INTRAVENOUS
  Filled 2021-08-24 (×2): qty 300

## 2021-08-24 NOTE — Progress Notes (Signed)
BRIEF PHARMACY NOTE: ? ?IV Clindamycin is currently on shortage. In response to this shortage, [facility] ID team has recommended the following antibiotic adjustment in place of IV clindamycin ? ?For surgical prophylaxis in patients with a severe penicillin allergy, clindamycin will be changed to vancomycin '1500mg'$  IV x1 for patients > 100kg. ? ? ?Dimple Nanas, PharmD ?08/24/2021 5:14 PM ? ?

## 2021-08-25 ENCOUNTER — Inpatient Hospital Stay (HOSPITAL_COMMUNITY)
Admission: RE | Admit: 2021-08-25 | Discharge: 2021-08-28 | DRG: 327 | Disposition: A | Discharge status: Home / Self Care | Payer: Medicare Other | Source: Ambulatory Visit | Attending: General Surgery | Admitting: General Surgery

## 2021-08-25 ENCOUNTER — Encounter (HOSPITAL_COMMUNITY)
Admission: RE | Disposition: A | Discharge status: Home / Self Care | Payer: Self-pay | Source: Ambulatory Visit | Attending: General Surgery

## 2021-08-25 ENCOUNTER — Inpatient Hospital Stay (HOSPITAL_COMMUNITY): Payer: Medicare Other | Admitting: Physician Assistant

## 2021-08-25 ENCOUNTER — Other Ambulatory Visit: Payer: Self-pay

## 2021-08-25 ENCOUNTER — Encounter (HOSPITAL_COMMUNITY): Payer: Self-pay | Admitting: General Surgery

## 2021-08-25 ENCOUNTER — Inpatient Hospital Stay (HOSPITAL_COMMUNITY): Payer: Medicare Other | Admitting: Anesthesiology

## 2021-08-25 DIAGNOSIS — K660 Peritoneal adhesions (postprocedural) (postinfection): Secondary | ICD-10-CM | POA: Diagnosis present

## 2021-08-25 DIAGNOSIS — K316 Fistula of stomach and duodenum: Secondary | ICD-10-CM

## 2021-08-25 DIAGNOSIS — E119 Type 2 diabetes mellitus without complications: Secondary | ICD-10-CM

## 2021-08-25 DIAGNOSIS — Z8249 Family history of ischemic heart disease and other diseases of the circulatory system: Secondary | ICD-10-CM

## 2021-08-25 DIAGNOSIS — R710 Precipitous drop in hematocrit: Secondary | ICD-10-CM | POA: Diagnosis not present

## 2021-08-25 DIAGNOSIS — Z841 Family history of disorders of kidney and ureter: Secondary | ICD-10-CM | POA: Diagnosis not present

## 2021-08-25 DIAGNOSIS — Z9884 Bariatric surgery status: Secondary | ICD-10-CM | POA: Diagnosis not present

## 2021-08-25 DIAGNOSIS — Z87891 Personal history of nicotine dependence: Secondary | ICD-10-CM

## 2021-08-25 DIAGNOSIS — Z8349 Family history of other endocrine, nutritional and metabolic diseases: Secondary | ICD-10-CM

## 2021-08-25 DIAGNOSIS — I10 Essential (primary) hypertension: Secondary | ICD-10-CM | POA: Diagnosis present

## 2021-08-25 DIAGNOSIS — K289 Gastrojejunal ulcer, unspecified as acute or chronic, without hemorrhage or perforation: Secondary | ICD-10-CM | POA: Diagnosis not present

## 2021-08-25 DIAGNOSIS — Z79899 Other long term (current) drug therapy: Secondary | ICD-10-CM | POA: Diagnosis not present

## 2021-08-25 DIAGNOSIS — K219 Gastro-esophageal reflux disease without esophagitis: Secondary | ICD-10-CM | POA: Diagnosis present

## 2021-08-25 DIAGNOSIS — G4730 Sleep apnea, unspecified: Secondary | ICD-10-CM | POA: Diagnosis not present

## 2021-08-25 DIAGNOSIS — Z881 Allergy status to other antibiotic agents status: Secondary | ICD-10-CM

## 2021-08-25 DIAGNOSIS — E782 Mixed hyperlipidemia: Secondary | ICD-10-CM | POA: Diagnosis not present

## 2021-08-25 DIAGNOSIS — F418 Other specified anxiety disorders: Secondary | ICD-10-CM | POA: Diagnosis not present

## 2021-08-25 DIAGNOSIS — Z888 Allergy status to other drugs, medicaments and biological substances status: Secondary | ICD-10-CM

## 2021-08-25 DIAGNOSIS — Z810 Family history of intellectual disabilities: Secondary | ICD-10-CM

## 2021-08-25 DIAGNOSIS — K6389 Other specified diseases of intestine: Secondary | ICD-10-CM | POA: Diagnosis not present

## 2021-08-25 DIAGNOSIS — J449 Chronic obstructive pulmonary disease, unspecified: Secondary | ICD-10-CM | POA: Diagnosis not present

## 2021-08-25 DIAGNOSIS — E1142 Type 2 diabetes mellitus with diabetic polyneuropathy: Secondary | ICD-10-CM | POA: Diagnosis not present

## 2021-08-25 DIAGNOSIS — M1712 Unilateral primary osteoarthritis, left knee: Secondary | ICD-10-CM | POA: Diagnosis not present

## 2021-08-25 DIAGNOSIS — E876 Hypokalemia: Secondary | ICD-10-CM | POA: Diagnosis not present

## 2021-08-25 DIAGNOSIS — Z880 Allergy status to penicillin: Secondary | ICD-10-CM | POA: Diagnosis not present

## 2021-08-25 DIAGNOSIS — R5382 Chronic fatigue, unspecified: Secondary | ICD-10-CM | POA: Diagnosis not present

## 2021-08-25 DIAGNOSIS — Z825 Family history of asthma and other chronic lower respiratory diseases: Secondary | ICD-10-CM

## 2021-08-25 DIAGNOSIS — F32A Depression, unspecified: Secondary | ICD-10-CM | POA: Diagnosis not present

## 2021-08-25 DIAGNOSIS — K2950 Unspecified chronic gastritis without bleeding: Secondary | ICD-10-CM | POA: Diagnosis not present

## 2021-08-25 DIAGNOSIS — R197 Diarrhea, unspecified: Secondary | ICD-10-CM | POA: Diagnosis present

## 2021-08-25 HISTORY — PX: UPPER GI ENDOSCOPY: SHX6162

## 2021-08-25 HISTORY — PX: GASTROJEJUNOSTOMY: SHX1697

## 2021-08-25 HISTORY — PX: PARTIAL GASTRECTOMY: SHX6003

## 2021-08-25 LAB — HEMOGLOBIN AND HEMATOCRIT, BLOOD
HCT: 37.6 % (ref 36.0–46.0)
Hemoglobin: 12.9 g/dL (ref 12.0–15.0)

## 2021-08-25 LAB — CBC
HCT: 42 % (ref 36.0–46.0)
Hemoglobin: 14.3 g/dL (ref 12.0–15.0)
MCH: 32.4 pg (ref 26.0–34.0)
MCHC: 34 g/dL (ref 30.0–36.0)
MCV: 95.2 fL (ref 80.0–100.0)
Platelets: 244 10*3/uL (ref 150–400)
RBC: 4.41 MIL/uL (ref 3.87–5.11)
RDW: 13.1 % (ref 11.5–15.5)
WBC: 21.7 10*3/uL — ABNORMAL HIGH (ref 4.0–10.5)
nRBC: 0 % (ref 0.0–0.2)

## 2021-08-25 LAB — GLUCOSE, CAPILLARY
Glucose-Capillary: 128 mg/dL — ABNORMAL HIGH (ref 70–99)
Glucose-Capillary: 186 mg/dL — ABNORMAL HIGH (ref 70–99)

## 2021-08-25 LAB — ABO/RH: ABO/RH(D): O POS

## 2021-08-25 LAB — CREATININE, SERUM
Creatinine, Ser: 0.81 mg/dL (ref 0.44–1.00)
GFR, Estimated: >60 mL/min (ref >60)

## 2021-08-25 SURGERY — GASTROJEJUNOSTOMY, LAPAROSCOPIC
Anesthesia: General

## 2021-08-25 MED ORDER — GABAPENTIN 300 MG PO CAPS
600.0000 mg | ORAL_CAPSULE | Freq: Every day | ORAL | Status: DC
  Administered 2021-08-25 – 2021-08-27 (×3): 600 mg via ORAL
  Filled 2021-08-25 (×3): qty 2

## 2021-08-25 MED ORDER — AMISULPRIDE (ANTIEMETIC) 5 MG/2ML IV SOLN: 10.0000 mg | Freq: Once | INTRAVENOUS | Status: DC | PRN

## 2021-08-25 MED ORDER — ACETAMINOPHEN 500 MG PO TABS
1000.0000 mg | ORAL_TABLET | ORAL | Status: AC
  Administered 2021-08-25: 1000 mg via ORAL
  Filled 2021-08-25: qty 2

## 2021-08-25 MED ORDER — SIMETHICONE 80 MG PO CHEW
80.0000 mg | CHEWABLE_TABLET | Freq: Four times a day (QID) | ORAL | Status: DC | PRN
Start: 2021-08-25 — End: 2021-08-28
  Administered 2021-08-25: 80 mg via ORAL
  Filled 2021-08-25 (×2): qty 1

## 2021-08-25 MED ORDER — CHLORHEXIDINE GLUCONATE CLOTH 2 % EX PADS: 6.0000 | MEDICATED_PAD | Freq: Once | CUTANEOUS | Status: DC

## 2021-08-25 MED ORDER — AMISULPRIDE (ANTIEMETIC) 5 MG/2ML IV SOLN
INTRAVENOUS | Status: AC
  Filled 2021-08-25: qty 4

## 2021-08-25 MED ORDER — OXYCODONE HCL 5 MG/5ML PO SOLN: 5.0000 mg | Freq: Once | ORAL | Status: DC | PRN

## 2021-08-25 MED ORDER — FENTANYL CITRATE PF 50 MCG/ML IJ SOSY: 25.0000 ug | PREFILLED_SYRINGE | INTRAMUSCULAR | Status: DC | PRN

## 2021-08-25 MED ORDER — HEPARIN SODIUM (PORCINE) 5000 UNIT/ML IJ SOLN
5000.0000 [IU] | INTRAMUSCULAR | Status: AC
  Administered 2021-08-25: 5000 [IU] via SUBCUTANEOUS
  Filled 2021-08-25: qty 1

## 2021-08-25 MED ORDER — ONDANSETRON HCL 4 MG/2ML IJ SOLN
INTRAMUSCULAR | Status: DC | PRN
  Administered 2021-08-25 (×2): 4 mg via INTRAVENOUS

## 2021-08-25 MED ORDER — LABETALOL HCL 5 MG/ML IV SOLN
INTRAVENOUS | Status: AC
  Filled 2021-08-25: qty 4

## 2021-08-25 MED ORDER — FENTANYL CITRATE PF 50 MCG/ML IJ SOSY
PREFILLED_SYRINGE | INTRAMUSCULAR | Status: AC
  Filled 2021-08-25: qty 2

## 2021-08-25 MED ORDER — ALPRAZOLAM 0.5 MG PO TABS: 0.5000 mg | ORAL_TABLET | Freq: Every evening | ORAL | Status: DC | PRN

## 2021-08-25 MED ORDER — BUPIVACAINE-EPINEPHRINE (PF) 0.25% -1:200000 IJ SOLN
INTRAMUSCULAR | Status: AC
  Filled 2021-08-25: qty 30

## 2021-08-25 MED ORDER — PROPOFOL 10 MG/ML IV BOLUS
INTRAVENOUS | Status: AC
  Filled 2021-08-25: qty 20

## 2021-08-25 MED ORDER — ONDANSETRON HCL 4 MG/2ML IJ SOLN
4.0000 mg | Freq: Once | INTRAMUSCULAR | Status: AC | PRN
  Administered 2021-08-25: 4 mg via INTRAVENOUS

## 2021-08-25 MED ORDER — ROCURONIUM BROMIDE 10 MG/ML (PF) SYRINGE
PREFILLED_SYRINGE | INTRAVENOUS | Status: AC
  Filled 2021-08-25: qty 10

## 2021-08-25 MED ORDER — ACETAMINOPHEN 500 MG PO TABS
1000.0000 mg | ORAL_TABLET | Freq: Three times a day (TID) | ORAL | Status: DC
  Administered 2021-08-25 – 2021-08-28 (×8): 1000 mg via ORAL
  Filled 2021-08-25 (×8): qty 2

## 2021-08-25 MED ORDER — OXYCODONE HCL 5 MG PO TABS: 5.0000 mg | ORAL_TABLET | Freq: Once | ORAL | Status: DC | PRN

## 2021-08-25 MED ORDER — PROPOFOL 10 MG/ML IV BOLUS
INTRAVENOUS | Status: DC | PRN
  Administered 2021-08-25: 200 mg via INTRAVENOUS

## 2021-08-25 MED ORDER — ACETAMINOPHEN 160 MG/5ML PO SOLN
1000.0000 mg | Freq: Three times a day (TID) | ORAL | Status: DC
  Filled 2021-08-25: qty 40.6

## 2021-08-25 MED ORDER — TIZANIDINE HCL 4 MG PO TABS: 4.0000 mg | ORAL_TABLET | Freq: Every evening | ORAL | Status: DC | PRN

## 2021-08-25 MED ORDER — FENTANYL CITRATE (PF) 100 MCG/2ML IJ SOLN
INTRAMUSCULAR | Status: AC
  Filled 2021-08-25: qty 2

## 2021-08-25 MED ORDER — HYDRALAZINE HCL 20 MG/ML IJ SOLN
10.0000 mg | INTRAMUSCULAR | Status: DC | PRN
  Filled 2021-08-25: qty 1

## 2021-08-25 MED ORDER — STERILE WATER FOR IRRIGATION IR SOLN
Status: DC | PRN
  Administered 2021-08-25: 2000 mL

## 2021-08-25 MED ORDER — FAMOTIDINE IN NACL 20-0.9 MG/50ML-% IV SOLN
20.0000 mg | Freq: Two times a day (BID) | INTRAVENOUS | Status: DC
  Administered 2021-08-25 – 2021-08-26 (×3): 20 mg via INTRAVENOUS
  Filled 2021-08-25 (×3): qty 50

## 2021-08-25 MED ORDER — SCOPOLAMINE 1 MG/3DAYS TD PT72
1.0000 | MEDICATED_PATCH | TRANSDERMAL | Status: DC
Start: 2021-08-25 — End: 2021-08-28
  Administered 2021-08-25: 1.5 mg via TRANSDERMAL
  Filled 2021-08-25: qty 1

## 2021-08-25 MED ORDER — CHLORHEXIDINE GLUCONATE 0.12 % MT SOLN
15.0000 mL | Freq: Once | OROMUCOSAL | Status: AC
  Administered 2021-08-25: 15 mL via OROMUCOSAL

## 2021-08-25 MED ORDER — LACTATED RINGERS IR SOLN
Status: DC | PRN
  Administered 2021-08-25: 1000 mL

## 2021-08-25 MED ORDER — SUGAMMADEX SODIUM 200 MG/2ML IV SOLN
INTRAVENOUS | Status: DC | PRN
  Administered 2021-08-25: 200 mg via INTRAVENOUS

## 2021-08-25 MED ORDER — ONDANSETRON HCL 4 MG/2ML IJ SOLN
INTRAMUSCULAR | Status: AC
  Filled 2021-08-25: qty 2

## 2021-08-25 MED ORDER — METOPROLOL SUCCINATE ER 50 MG PO TB24
50.0000 mg | ORAL_TABLET | Freq: Every day | ORAL | Status: DC
  Administered 2021-08-25 – 2021-08-27 (×3): 50 mg via ORAL
  Filled 2021-08-25 (×3): qty 1

## 2021-08-25 MED ORDER — ACETAMINOPHEN 500 MG PO TABS: 1000.0000 mg | ORAL_TABLET | Freq: Once | ORAL | Status: DC

## 2021-08-25 MED ORDER — MIDAZOLAM HCL 5 MG/5ML IJ SOLN
INTRAMUSCULAR | Status: DC | PRN
  Administered 2021-08-25: 2 mg via INTRAVENOUS

## 2021-08-25 MED ORDER — FINASTERIDE 5 MG PO TABS
5.0000 mg | ORAL_TABLET | Freq: Every day | ORAL | Status: DC
  Administered 2021-08-25 – 2021-08-28 (×4): 5 mg via ORAL
  Filled 2021-08-25 (×4): qty 1

## 2021-08-25 MED ORDER — LIDOCAINE HCL (PF) 2 % IJ SOLN
INTRAMUSCULAR | Status: AC
  Filled 2021-08-25: qty 5

## 2021-08-25 MED ORDER — FENTANYL CITRATE (PF) 100 MCG/2ML IJ SOLN
INTRAMUSCULAR | Status: DC | PRN
  Administered 2021-08-25: 100 ug via INTRAVENOUS
  Administered 2021-08-25 (×2): 50 ug via INTRAVENOUS

## 2021-08-25 MED ORDER — ROPINIROLE HCL 1 MG PO TABS
1.0000 mg | ORAL_TABLET | Freq: Every day | ORAL | Status: DC
  Administered 2021-08-25 – 2021-08-27 (×3): 1 mg via ORAL
  Filled 2021-08-25 (×3): qty 1

## 2021-08-25 MED ORDER — MIDAZOLAM HCL 2 MG/2ML IJ SOLN
INTRAMUSCULAR | Status: AC
  Filled 2021-08-25: qty 2

## 2021-08-25 MED ORDER — ONDANSETRON HCL 4 MG/2ML IJ SOLN: 4.0000 mg | INTRAMUSCULAR | Status: DC | PRN

## 2021-08-25 MED ORDER — APREPITANT 40 MG PO CAPS
40.0000 mg | ORAL_CAPSULE | ORAL | Status: AC
  Administered 2021-08-25: 40 mg via ORAL
  Filled 2021-08-25: qty 1

## 2021-08-25 MED ORDER — BUPIVACAINE-EPINEPHRINE 0.25% -1:200000 IJ SOLN
INTRAMUSCULAR | Status: DC | PRN
  Administered 2021-08-25: 30 mL

## 2021-08-25 MED ORDER — AMISULPRIDE (ANTIEMETIC) 5 MG/2ML IV SOLN
10.0000 mg | Freq: Once | INTRAVENOUS | Status: AC | PRN
  Administered 2021-08-25: 10 mg via INTRAVENOUS
  Filled 2021-08-25: qty 4

## 2021-08-25 MED ORDER — LACTATED RINGERS IV SOLN: INTRAVENOUS | Status: DC

## 2021-08-25 MED ORDER — DEXAMETHASONE SODIUM PHOSPHATE 4 MG/ML IJ SOLN: 4.0000 mg | INTRAMUSCULAR | Status: DC

## 2021-08-25 MED ORDER — BUPIVACAINE LIPOSOME 1.3 % IJ SUSP: 20.0000 mL | Freq: Once | INTRAMUSCULAR | Status: DC

## 2021-08-25 MED ORDER — FENTANYL CITRATE PF 50 MCG/ML IJ SOSY
25.0000 ug | PREFILLED_SYRINGE | INTRAMUSCULAR | Status: DC | PRN
  Administered 2021-08-25: 50 ug via INTRAVENOUS

## 2021-08-25 MED ORDER — DEXTROSE-NACL 5-0.45 % IV SOLN: INTRAVENOUS | Status: DC

## 2021-08-25 MED ORDER — MONTELUKAST SODIUM 10 MG PO TABS
10.0000 mg | ORAL_TABLET | Freq: Every day | ORAL | Status: DC
  Administered 2021-08-25 – 2021-08-27 (×3): 10 mg via ORAL
  Filled 2021-08-25 (×3): qty 1

## 2021-08-25 MED ORDER — ESCITALOPRAM OXALATE 10 MG PO TABS
10.0000 mg | ORAL_TABLET | Freq: Every day | ORAL | Status: DC
  Administered 2021-08-25 – 2021-08-28 (×4): 10 mg via ORAL
  Filled 2021-08-25 (×4): qty 1

## 2021-08-25 MED ORDER — BUPIVACAINE LIPOSOME 1.3 % IJ SUSP
INTRAMUSCULAR | Status: AC
  Filled 2021-08-25: qty 20

## 2021-08-25 MED ORDER — ROCURONIUM BROMIDE 100 MG/10ML IV SOLN
INTRAVENOUS | Status: DC | PRN
  Administered 2021-08-25: 5 mg via INTRAVENOUS
  Administered 2021-08-25: 25 mg via INTRAVENOUS
  Administered 2021-08-25: 20 mg via INTRAVENOUS
  Administered 2021-08-25: 40 mg via INTRAVENOUS

## 2021-08-25 MED ORDER — SCOPOLAMINE 1 MG/3DAYS TD PT72: 1.0000 | MEDICATED_PATCH | TRANSDERMAL | Status: DC

## 2021-08-25 MED ORDER — MIRTAZAPINE 15 MG PO TABS
15.0000 mg | ORAL_TABLET | Freq: Every day | ORAL | Status: DC
  Administered 2021-08-25 – 2021-08-27 (×3): 15 mg via ORAL
  Filled 2021-08-25 (×4): qty 1

## 2021-08-25 MED ORDER — MORPHINE SULFATE (PF) 2 MG/ML IV SOLN
1.0000 mg | INTRAVENOUS | Status: DC | PRN
  Administered 2021-08-25 – 2021-08-26 (×7): 2 mg via INTRAVENOUS
  Filled 2021-08-25 (×7): qty 1

## 2021-08-25 MED ORDER — BUPIVACAINE LIPOSOME 1.3 % IJ SUSP
INTRAMUSCULAR | Status: DC | PRN
  Administered 2021-08-25: 20 mL

## 2021-08-25 MED ORDER — LIDOCAINE HCL (CARDIAC) PF 100 MG/5ML IV SOSY
PREFILLED_SYRINGE | INTRAVENOUS | Status: DC | PRN
  Administered 2021-08-25: 60 mg via INTRAVENOUS

## 2021-08-25 MED ORDER — BUPROPION HCL ER (SR) 100 MG PO TB12
100.0000 mg | ORAL_TABLET | Freq: Two times a day (BID) | ORAL | Status: DC
  Administered 2021-08-26 – 2021-08-28 (×3): 100 mg via ORAL
  Filled 2021-08-25 (×6): qty 1

## 2021-08-25 MED ORDER — ENOXAPARIN SODIUM 30 MG/0.3ML IJ SOSY
30.0000 mg | PREFILLED_SYRINGE | Freq: Two times a day (BID) | INTRAMUSCULAR | Status: DC
  Administered 2021-08-25 – 2021-08-28 (×6): 30 mg via SUBCUTANEOUS
  Filled 2021-08-25 (×6): qty 0.3

## 2021-08-25 MED ORDER — PHENYLEPHRINE 40 MCG/ML (10ML) SYRINGE FOR IV PUSH (FOR BLOOD PRESSURE SUPPORT)
PREFILLED_SYRINGE | INTRAVENOUS | Status: DC | PRN
  Administered 2021-08-25: 80 ug via INTRAVENOUS
  Administered 2021-08-25: 120 ug via INTRAVENOUS
  Administered 2021-08-25 (×2): 80 ug via INTRAVENOUS

## 2021-08-25 MED ORDER — PHENYLEPHRINE 40 MCG/ML (10ML) SYRINGE FOR IV PUSH (FOR BLOOD PRESSURE SUPPORT)
PREFILLED_SYRINGE | INTRAVENOUS | Status: AC
  Filled 2021-08-25: qty 10

## 2021-08-25 MED ORDER — CILOSTAZOL 100 MG PO TABS
100.0000 mg | ORAL_TABLET | Freq: Two times a day (BID) | ORAL | Status: DC
  Administered 2021-08-25 – 2021-08-28 (×6): 100 mg via ORAL
  Filled 2021-08-25 (×6): qty 1

## 2021-08-25 MED ORDER — ENSURE MAX PROTEIN PO LIQD
2.0000 [oz_av] | ORAL | Status: DC
  Administered 2021-08-26 – 2021-08-28 (×18): 2 [oz_av] via ORAL

## 2021-08-25 MED ORDER — OXYCODONE HCL 5 MG/5ML PO SOLN
5.0000 mg | Freq: Four times a day (QID) | ORAL | Status: DC | PRN
  Administered 2021-08-27 – 2021-08-28 (×5): 5 mg via ORAL
  Filled 2021-08-25 (×5): qty 5

## 2021-08-25 MED ORDER — LABETALOL HCL 5 MG/ML IV SOLN
10.0000 mg | Freq: Once | INTRAVENOUS | Status: AC
  Administered 2021-08-25: 10 mg via INTRAVENOUS

## 2021-08-25 MED ORDER — SUCCINYLCHOLINE CHLORIDE 200 MG/10ML IV SOSY
PREFILLED_SYRINGE | INTRAVENOUS | Status: DC | PRN
  Administered 2021-08-25: 100 mg via INTRAVENOUS

## 2021-08-25 MED ORDER — 0.9 % SODIUM CHLORIDE (POUR BTL) OPTIME
TOPICAL | Status: DC | PRN
  Administered 2021-08-25: 1000 mL

## 2021-08-25 MED ORDER — ORAL CARE MOUTH RINSE: 15.0000 mL | Freq: Once | OROMUCOSAL | Status: AC

## 2021-08-25 SURGICAL SUPPLY — 64 items
ADH SKN CLS APL DERMABOND .7 (GAUZE/BANDAGES/DRESSINGS)
APL PRP STRL LF DISP 70% ISPRP (MISCELLANEOUS) ×2
APL SKNCLS STERI-STRIP NONHPOA (GAUZE/BANDAGES/DRESSINGS) ×2
BAG COUNTER SPONGE SURGICOUNT (BAG) IMPLANT
BAG LAPAROSCOPIC 12 15 PORT 16 (BASKET) IMPLANT
BAG RETRIEVAL 12/15 (BASKET) ×3
BAG SPNG CNTER NS LX DISP (BAG)
BENZOIN TINCTURE PRP APPL 2/3 (GAUZE/BANDAGES/DRESSINGS) ×1 IMPLANT
BNDG ADH 1X3 SHEER STRL LF (GAUZE/BANDAGES/DRESSINGS) ×6 IMPLANT
BNDG ADH THN 3X1 STRL LF (GAUZE/BANDAGES/DRESSINGS) ×4
BNDG ELASTIC 2X5.8 VLCR STR LF (GAUZE/BANDAGES/DRESSINGS) ×1 IMPLANT
CABLE HIGH FREQUENCY MONO STRZ (ELECTRODE) IMPLANT
CATH BOLUS GASTRO 22FR (CATHETERS) IMPLANT
CATH GASTROSTOMY 24FR (CATHETERS) IMPLANT
CHLORAPREP W/TINT 26 (MISCELLANEOUS) ×3 IMPLANT
CLIP SUT LAPRA TY ABSORB (SUTURE) ×1 IMPLANT
CLSR STERI-STRIP ANTIMIC 1/2X4 (GAUZE/BANDAGES/DRESSINGS) ×1 IMPLANT
COVER SURGICAL LIGHT HANDLE (MISCELLANEOUS) ×3 IMPLANT
DERMABOND ADVANCED (GAUZE/BANDAGES/DRESSINGS)
DERMABOND ADVANCED .7 DNX12 (GAUZE/BANDAGES/DRESSINGS) IMPLANT
DEVICE SUTURE ENDOST 10MM (ENDOMECHANICALS) ×1 IMPLANT
G-TUBE MIC 24F 7-10 BALLOON (CATHETERS) IMPLANT
GLOVE BIOGEL PI IND STRL 7.0 (GLOVE) ×2 IMPLANT
GLOVE BIOGEL PI INDICATOR 7.0 (GLOVE) ×1
GLOVE SURG SS PI 7.0 STRL IVOR (GLOVE) ×3 IMPLANT
GOWN STRL REUS W/ TWL LRG LVL3 (GOWN DISPOSABLE) ×2 IMPLANT
GOWN STRL REUS W/ TWL XL LVL3 (GOWN DISPOSABLE) IMPLANT
GOWN STRL REUS W/TWL LRG LVL3 (GOWN DISPOSABLE) ×3
GOWN STRL REUS W/TWL XL LVL3 (GOWN DISPOSABLE)
GRASPER SUT TROCAR 14GX15 (MISCELLANEOUS) ×1 IMPLANT
IRRIG SUCT STRYKERFLOW 2 WTIP (MISCELLANEOUS)
IRRIGATION SUCT STRKRFLW 2 WTP (MISCELLANEOUS) IMPLANT
KIT BASIN OR (CUSTOM PROCEDURE TRAY) ×3 IMPLANT
KIT GASTRIC LAVAGE 34FR ADT (SET/KITS/TRAYS/PACK) ×1 IMPLANT
LUBRICANT JELLY K Y 4OZ (MISCELLANEOUS) IMPLANT
PENCIL SMOKE EVACUATOR (MISCELLANEOUS) IMPLANT
RELOAD ENDO STITCH 2.0 (ENDOMECHANICALS) ×15
RELOAD STAPLE 60 2.6 WHT THN (STAPLE) IMPLANT
RELOAD STAPLE 60 3.6 BLU REG (STAPLE) IMPLANT
RELOAD STAPLE 60 3.8 GOLD REG (STAPLE) IMPLANT
RELOAD STAPLER BLUE 60MM (STAPLE) ×6 IMPLANT
RELOAD STAPLER GOLD 60MM (STAPLE) ×4 IMPLANT
RELOAD STAPLER WHITE 60MM (STAPLE) ×2 IMPLANT
RELOAD SUT SNGL STCH ABSRB 2-0 (ENDOMECHANICALS) IMPLANT
SCISSORS LAP 5X35 DISP (ENDOMECHANICALS) ×1 IMPLANT
SET IRRIG Y TYPE TUR BLADDER L (SET/KITS/TRAYS/PACK) IMPLANT
SET TUBE SMOKE EVAC HIGH FLOW (TUBING) ×3 IMPLANT
SLEEVE XCEL OPT CAN 5 100 (ENDOMECHANICALS) ×3 IMPLANT
SPIKE FLUID TRANSFER (MISCELLANEOUS) ×3 IMPLANT
STAPLER ECHELON LONG 3000 60 (ENDOMECHANICALS) ×1 IMPLANT
STAPLER RELOAD BLUE 60MM (STAPLE) ×9
STAPLER RELOAD GOLD 60MM (STAPLE) ×6
STAPLER RELOAD WHITE 60MM (STAPLE) ×3
STRIP CLOSURE SKIN 1/2X4 (GAUZE/BANDAGES/DRESSINGS) IMPLANT
SUT ETHILON 2 0 PS N (SUTURE) IMPLANT
SUT MNCRL AB 4-0 PS2 18 (SUTURE) ×3 IMPLANT
SUT RELOAD ENDO STITCH 2 48X1 (ENDOMECHANICALS) ×10
SUTURE RELOAD END STTCH 2 48X1 (ENDOMECHANICALS) ×10 IMPLANT
TOWEL OR 17X26 10 PK STRL BLUE (TOWEL DISPOSABLE) ×3 IMPLANT
TOWEL OR NON WOVEN STRL DISP B (DISPOSABLE) IMPLANT
TRAY LAPAROSCOPIC (CUSTOM PROCEDURE TRAY) ×3 IMPLANT
TROCAR ADV FIXATION 12X100MM (TROCAR) ×1 IMPLANT
TROCAR BLADELESS OPT 5 100 (ENDOMECHANICALS) ×3 IMPLANT
TROCAR Z-THREAD FIOS 5X100MM (TROCAR) ×1 IMPLANT

## 2021-08-25 NOTE — Progress Notes (Addendum)
Patient has coughed up vs. Vomitted rougly 50cc of blood with phlegm mixed in with it. Cristy RN contacted MD on this RN behalf. See new orders. ?

## 2021-08-25 NOTE — Transfer of Care (Signed)
Immediate Anesthesia Transfer of Care Note ? ?Patient: Wendy Hubbard ? ?Procedure(s) Performed: LAPAROSCOPIC REDO OF STOMACH TO INTESTINE ?PARTIAL GASTRECTOMY ?UPPER GI ENDOSCOPY ? ?Patient Location: PACU ? ?Anesthesia Type:General ? ?Level of Consciousness: awake and drowsy ? ?Airway & Oxygen Therapy: Patient Spontanous Breathing and Patient connected to face mask oxygen ? ?Post-op Assessment: Report given to RN and Post -op Vital signs reviewed and stable ? ?Post vital signs: Reviewed and stable ? ?Last Vitals:  ?Vitals Value Taken Time  ?BP 144/76 08/25/21 1052  ?Temp    ?Pulse 76 08/25/21 1055  ?Resp 16 08/25/21 1055  ?SpO2 100 % 08/25/21 1055  ?Vitals shown include unvalidated device data. ? ?Last Pain:  ?Vitals:  ? 08/25/21 0630  ?TempSrc:   ?PainSc: 0-No pain  ?   ? ?  ? ?Complications: No notable events documented. ?

## 2021-08-25 NOTE — Op Note (Signed)
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 ?Procedure: Upper endoscopy  ? ?Surgeon: Clovis Riley, M.D. ? ?Anesthesia: Gen.  ? ?Description of procedure: The endoscope was placed in the mouth and oropharynx and under endoscopic vision it was advanced to the esophagogastric junction which was identified at 39cm from the teeth.  The pouch was tensely insufflated while the upper abdomen was flooded with irrigation to perform a leak test, which was negative. No bubbles were seen.  The staple line was hemostatic and the anastomosis was visibly patent and hemostatic.  The gastric mucosa and anastomosis appeared well perfused.  The pouch measures 4 cm in length. The lumen was decompressed and the scope was withdrawn without difficulty.   ? ?Clovis Riley, M.D. ?General, Bariatric, & Minimally Invasive Surgery ?[facility] ? ? ?

## 2021-08-25 NOTE — H&P (Signed)
Chief Complaint: fistula of stomach ? ? ?History of Present Illness: ?Wendy Hubbard is a 66 y.o. female who is seen today as an office consultation at the request of Dr. 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  ? ?She had a gastric bypass in 2017 that le to immediate indigestion and she had 2 additional procedures in the next year. She had a hiatal hernia repair and then had a BP limb distalization. She initially lost 80 lb and now has regained 50 lbs. She has pain with most foods but the severity can change. She has diarrhea multiple times a day. ? ?Review of Systems: ?A complete review of systems was obtained from the patient. I have reviewed this information and discussed as appropriate with the patient. See HPI as well for other ROS. ? ?ROS  ? ?Medical History: ?Past Medical History:  ?Diagnosis Date  ? Depression  ? Diabetes mellitus, type II (CMS-HCC)  ? GERD (gastroesophageal reflux disease)  ? Hypertension  ? Neuropathy  ? Osteoarthritis  ? Sleep apnea  ? ?Patient Active Problem List  ?Diagnosis  ? Primary osteoarthritis of left knee  ? Shortness of breath  ? Mixed hyperlipidemia  ? Benign essential HTN  ? Sensorimotor neuropathy  ? ?Past Surgical History:  ?Procedure Laterality Date  ? TONSILLECTOMY 1962  ? APPENDECTOMY 1967  ? CHOLECYSTECTOMY 1992  ? COLONOSCOPY 06/16/1994  ?Int Hemorrhoids  ? EGD 01/09/2005  ? COLONOSCOPY 01/09/2005  ?04/25/1999; Hyperplastic Polyps  ? COLONOSCOPY 12/25/2011  ?09/20/2008; Int Hemorrhoids: CBF 12/2021  ? EGD 11/02/2015  ?(Inpt) Gastritis: No repeat per RTE  ? COLONOSCOPY 11/04/2015  ?(Inpt) Int Hemorrhoids: CBF 10/2025  ? gastric by pass  ? HYSTERECTOMY  ? intestinal reroute  ? OOPHORECTOMY  ? PERCUTANEOUS BIOPSY BREAST 1990-94  ? REPAIR ESOPHAGUS THORACOTOMY  ? ? ?Allergies  ?Allergen Reactions  ? Doxycycline Hives  ? Flagyl [Metronidazole Hcl] Anaphylaxis and Rash  ? Metronidazole Anaphylaxis, Hives and Rash  ?Severe rash  ? Other Other (See Comments)  ? Penicillins  Anaphylaxis, Other (See Comments) and Shortness Of Breath  ?Has patient had a PCN reaction causing immediate rash, facial/tongue/throat swelling, SOB or lightheadedness with hypotension: Yes ?Has patient had a PCN reaction causing severe rash involving mucus membranes or skin necrosis: No ?Has patient had a PCN reaction that required hospitalization No ?Has patient had a PCN reaction occurring within the last 10 years: No ?If all of the above answers are "NO", then may proceed with Cephalosporin use. ?Other reaction(s): Other (See Comments) ?Has patient had a PCN reaction causing immediate rash, facial/tongue/throat swelling, SOB or lightheadedness with hypotension: Yes ?Has patient had a PCN reaction causing severe rash involving mucus membranes or skin necrosis: No ?Has patient had a PCN reaction that required hospitalization No ?Has patient had a PCN reaction occurring within the last 10 years: No ?If all of the above answers are "NO", then may proceed with Cephalosporin use. ? ? ?Current Outpatient Medications on File Prior to Visit  ?Medication Sig Dispense Refill  ? alosetron (LOTRONEX) 1 MG tablet Take 1 tablet by mouth 2 (two) times daily  ? buPROPion (WELLBUTRIN SR) 100 MG SR tablet Take 100 mg by mouth 2 (two) times daily  ? butalbital-acetaminophen-caffeine (FIORICET) 50-325-40 mg tablet Take by mouth  ? cilostazoL (PLETAL) 100 MG tablet Take 100 mg by mouth 2 (two) times daily  ? diphenoxylate-atropine (LOMOTIL) 2.5-0.025 mg tablet Take by mouth  ? hydrocodone-chlorpheniramine (TUSSIONEX) 10-8 mg/5 mL ER suspension Take 5 mLs  by mouth every 12 (twelve) hours as needed  ? mirtazapine (REMERON) 15 MG tablet Take 1 tablet by mouth at bedtime  ? montelukast (SINGULAIR) 10 mg tablet Take 1 tablet by mouth at bedtime  ? rOPINIRole (REQUIP) 0.5 MG tablet TAKE 1 TABLET BY MOUTH AT BEDTIME MAY INCREASE TO 2 TABS AT BEDTIME AS NEEDED TOLERATED  ? rosuvastatin (CRESTOR) 10 MG tablet Take 1 tablet by mouth once  daily  ? semaglutide (RYBELSUS) 7 mg tablet Take by mouth  ? sod picosulf-mag ox-citric ac (CLENPIQ) 10 mg-3.5 gram -12 gram/160 mL Soln Take by mouth  ? ALPRAZolam (XANAX) 0.5 MG tablet Take by mouth  ? cetirizine (ZYRTEC) 10 mg capsule Take 1 capsule by mouth once daily  ? DULoxetine (CYMBALTA) 20 MG DR capsule Take 1 capsule (20 mg total) by mouth once daily 90 capsule 3  ? escitalopram oxalate (LEXAPRO) 10 MG tablet Take 10 mg by mouth once daily.  ? EUA PAXLOVID 300 mg (150 mg x 2)-100 mg tablet as directed  ? finasteride (PROSCAR) 5 mg tablet  ? fluticasone propionate (FLONASE) 50 mcg/actuation nasal spray by Nasal route  ? FUROsemide (LASIX) 20 MG tablet  ? gabapentin (NEURONTIN) 300 MG capsule Take by mouth '600mg'$  in the morning, '600mg'$  in the afternoon, '1200mg'$  at bedtime  ? HYDROcodone-acetaminophen (NORCO) 5-325 mg tablet 1 tablet nightly.  ? ipratropium-albuteroL (DUO-NEB) nebulizer solution  ? levoFLOXacin (LEVAQUIN) 500 MG tablet  ? methylPREDNISolone (MEDROL DOSEPACK) 4 mg tablet  ? metoprolol succinate (TOPROL-XL) 50 MG XL tablet Take 75 mg by mouth nightly 0  ? OZEMPIC 0.25 mg or 0.5 mg(2 mg/1.5 mL) pen injector  ? pantoprazole (PROTONIX) 40 MG DR tablet Take 40 mg by mouth once daily 3  ? promethazine (PHENERGAN) 6.25 mg/5 mL syrup  ? tiZANidine (ZANAFLEX) 4 MG tablet take 1 tablet by mouth at bedtime if needed 30 tablet 1  ? ?No current facility-administered medications on file prior to visit.  ? ?Family History  ?Problem Relation Age of Onset  ? COPD Mother  ? Heart disease Mother  ? Deep vein thrombosis (DVT or abnormal blood clot formation) Mother  ? Thyroid disease Mother  ? Cancer Mother  ? Dementia Mother  ? High blood pressure (Hypertension) Mother  ? Migraines Mother  ? COPD Father  ? Heart disease Father  ? Coronary Artery Disease (Blocked arteries around heart) Father  ? Deep vein thrombosis (DVT or abnormal blood clot formation) Father  ? High blood pressure (Hypertension) Father  ? Kidney  disease Father  ? Seizures Brother  ? Mental retardation Brother  ? No Known Problems Daughter  ? No Known Problems Daughter  ? ? ?Social History  ? ?Tobacco Use  ?Smoking Status Former  ?Smokeless Tobacco Never  ? ? ?Social History  ? ?Socioeconomic History  ? Marital status: Married  ?Tobacco Use  ? Smoking status: Former  ? Smokeless tobacco: Never  ?Substance and Sexual Activity  ? Alcohol use: Yes  ?Alcohol/week: 2.0 standard drinks  ?Types: 2 Shots of liquor per week  ? Drug use: No  ? Sexual activity: Yes  ?Partners: Male  ? ?Objective:  ? ?Vitals:  ?07/02/21 1007  ?BP: 120/84  ?Pulse: 87  ?Temp: 36.7 ?C (98 ?F)  ?SpO2: 98%  ?Weight: (!) 102.2 kg (225 lb 3.2 oz)  ?Height: 162.6 cm ('5\' 4"'$ )  ? ?Body mass index is 38.66 kg/m?. ? ?Physical Exam  ? ?Labs, Imaging and Diagnostic Testing: ?I reviewed Dr. Synthia Innocent notes. I reviewed  CT scan images from 12/22 showing contrast within the remnant stomach ? ?Assessment and Plan:  ? ?Diagnoses and all orders for this visit: ? ?Gastrogastric fistula ?- CBC, No Differential/Platelet - [facility] ?- Basic Metabolic Panel (BMP) ?- Lipid Panel - [facility] ?- T4+Free T4 - [facility] ?- Thyroid Stimulating-Hormone (TSH) ?- H. pylori Breath Test - [facility] ?- Iron and Total Iron Binding Capacity (TIBC) ?- Vitamin B12 - [facility] ?- Folate ?- Hemoglobin A1C ?- Prothrombin Time (INR) ?- Vitamin D, 25-Hydroxy - [facility] ? ?Chronic fatigue ?- CBC, No Differential/Platelet - [facility] ?- Basic Metabolic Panel (BMP) ?- Lipid Panel - [facility] ?- T4+Free T4 - [facility] ?- Thyroid Stimulating-Hormone (TSH) ?- H. pylori Breath Test - [facility] ?- Iron and Total Iron Binding Capacity (TIBC) ?- Vitamin B12 - [facility] ?- Folate ?- Hemoglobin A1C ?- Prothrombin Time (INR) ?- Vitamin D, 25-Hydroxy - [facility] ? ?S/P gastric bypass ?- CBC, No Differential/Platelet - [facility] ?- Basic Metabolic Panel (BMP) ?- Lipid Panel - [facility] ?- T4+Free T4 - [facility] ?- Thyroid Stimulating-Hormone (TSH) ?- H. pylori Breath Test -  [facility] ?- Iron and Total Iron Binding Capacity (TIBC) ?- Vitamin B12 - [facility] ?- Folate ?- Hemoglobin A1C ?- Prothrombin Time (INR) ?- Vitamin D, 25-Hydroxy - [facility] ? ?This appears most consistent with

## 2021-08-25 NOTE — Anesthesia Postprocedure Evaluation (Signed)
Anesthesia Post Note ? ?Patient: Wendy Hubbard ? ?Procedure(s) Performed: LAPAROSCOPIC REDO OF STOMACH TO INTESTINE ?PARTIAL GASTRECTOMY ?UPPER GI ENDOSCOPY ? ?  ? ?Patient location during evaluation: PACU ?Anesthesia Type: General ?Level of consciousness: awake and alert ?Pain management: pain level controlled ?Vital Signs Assessment: post-procedure vital signs reviewed and stable ?Respiratory status: spontaneous breathing, nonlabored ventilation and respiratory function stable ?Cardiovascular status: blood pressure returned to baseline and stable ?Postop Assessment: no apparent nausea or vomiting ?Anesthetic complications: no ? ? ?No notable events documented. ? ?Last Vitals:  ?Vitals:  ? 08/25/21 1209 08/25/21 1228  ?BP: (!) 164/78 (!) 166/76  ?Pulse: 68 75  ?Resp: 20 17  ?Temp: 36.4 ?C   ?SpO2: 99% 99%  ?  ?Last Pain:  ?Vitals:  ? 08/25/21 1238  ?TempSrc:   ?PainSc: 8   ? ? ?  ?  ?  ?  ?  ?  ? ?Lidia Collum ? ? ? ? ?

## 2021-08-25 NOTE — Anesthesia Procedure Notes (Addendum)
Procedure Name: Intubation ?Date/Time: 08/25/2021 8:37 AM ?Performed by: [place] Courvoisier Hamblen C, CRNA ?Pre-anesthesia Checklist: Patient identified, Emergency Drugs available, Suction available and Patient being monitored ?Patient Re-evaluated:Patient Re-evaluated prior to induction ?Oxygen Delivery Method: Circle system utilized ?Preoxygenation: Pre-oxygenation with 100% oxygen ?Induction Type: IV induction and Rapid sequence ?Ventilation: Mask ventilation without difficulty ?Laryngoscope Size: Mac and 3 ?Grade View: Grade II ?Tube type: Oral ?Tube size: 7.0 mm ?Number of attempts: 1 ?Airway Equipment and Method: Stylet and Oral airway ?Placement Confirmation: ETT inserted through vocal cords under direct vision, positive ETCO2 and breath sounds checked- equal and bilateral ?Secured at: 20 cm ?Tube secured with: Tape ?Dental Injury: Teeth and Oropharynx as per pre-operative assessment  ? ? ? ? ?

## 2021-08-25 NOTE — Progress Notes (Signed)
PHARMACY CONSULT FOR:  Risk Assessment for Post-Discharge VTE Following Bariatric Surgery ? ?Post-Discharge VTE Risk Assessment: ?This patient's probability of 30-day post-discharge VTE is increased due to the factors marked: ? Sleeve gastrectomy  ? Liver disorder (transplant, cirrhosis, or nonalcoholic steatohepatitis)  ? Hx of VTE  ? Hemorrhage requiring transfusion  ? GI perforation, leak, or obstruction  ? ====================================================  ?  Female  ?x  Age >/=60 years  ?  BMI >/=50 kg/m2  ?  CHF  ?  Dyspnea at Rest  ?  Paraplegia  ? x Non-gastric-band surgery  ?  Operation Time >/=3 hr  ?  Return to OR   ?  Length of Stay >/= 3 d  ? Hypercoagulable condition  ? Significant venous stasis  ? ? ? ? ?Predicted probability of 30-day post-discharge VTE: 0.31 % ? ?Other patient-specific factors to consider: ? ? ?Recommendation for Discharge: ?No pharmacologic prophylaxis post-discharge ? ? ? ? ? ?Wendy Hubbard is a 66 y.o. 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  ?Case start: 0815 ?Case end: 4944 ? ? ?Allergies  ?Allergen Reactions  ? Flagyl [Metronidazole] Anaphylaxis and Rash  ? Penicillins Anaphylaxis  ? Doxycycline Hives  ? ? ?Patient Measurements: ?Weight: 103.4 kg (228 lb) ?Body mass index is 39.14 kg/m?. ? ?No results for input(s): WBC, HGB, HCT, PLT, APTT, CREATININE, LABCREA, CREATININE, CREAT24HRUR, MG, PHOS, ALBUMIN, PROT, ALBUMIN, AST, ALT, ALKPHOS, BILITOT, BILIDIR, IBILI in the last 72 hours. ?Estimated Creatinine Clearance: 74.7 mL/min (by C-G formula based on SCr of 0.88 mg/dL). ? ? ? ?Past Medical History:  ?Diagnosis Date  ? Allergy   ? [employer]reesia 07/23/2020  ? Anxiety   ? [employer] 07/23/2020  ? COPD (chronic obstructive pulmonary disease) ([place])   ? DDD (degenerative disc disease), thoracic   ? Depression   ? [employer]rees[employer] 07/23/2020  ? Diabetes mellitus without  complication ([place])   ? type 2  ? GERD (gastroesophageal reflux disease)   ? Hyperlipidemia   ? Neuromuscular disorder ([place])   ? severe neuropathy feet  ? Pneumonia   ? Rapid heart rate   ? Sleep apnea   ? no longer uses CPAP due to weight loss  ? Wears contact lenses   ? ? ? ?Medications Prior to Admission  ?Medication Sig Dispense Refill Last Dose  ? alosetron (LOTRONEX) 1 MG tablet Take 1 tablet (1 mg total) by mouth 2 (two) times daily. (Patient taking differently: Take 1 mg by mouth 2 (two) times daily.) 60 tablet 5 08/24/2021  ? ALPRAZolam (XANAX) 0.5 MG tablet TAKE 1/2 TO 1 TABLET BY MOUTH ONCE DAILYAS NEEDED FOR ACUTE ANXIETY 30 tablet 0 08/25/2021  ? Biotin 10000 MCG TABS Take 10,000 mcg by mouth daily.   Past Week  ? buPROPion ER (WELLBUTRIN SR) 100 MG 12 hr tablet Take 1 tablet (100 mg total) by mouth 2 (two) times daily. 60 tablet 2 08/25/2021  ? butalbital-acetaminophen-caffeine (FIORICET) 50-325-40 MG tablet Take 1-2 tablets by mouth every 6 (six) hours as needed for headache. 30 tablet 2 08/25/2021  ? cilostazol (PLETAL) 100 MG tablet Take 1 tablet (100 mg total) by mouth 2 (two) times daily. (Patient taking differently: Take 200 mg by mouth at bedtime.) 180 tablet 1 08/24/2021  ? diphenoxylate-atropine (LOMOTIL) 2.5-0.025 MG tablet Take 1 tablet by mouth 4 (four) times daily as needed for diarrhea or loose stools. 30 tablet 1 Past  Month  ? escitalopram (LEXAPRO) 10 MG tablet TAKE 1 TABLET DAILY 90 tablet 1 08/24/2021  ? finasteride (PROSCAR) 5 MG tablet Take 5 mg by mouth daily.   08/24/2021  ? furosemide (LASIX) 20 MG tablet Take 20 mg by mouth daily.   08/24/2021  ? gabapentin (NEURONTIN) 600 MG tablet Take one AM, one after lunch, and 2 QHS (Patient taking differently: Take 600 mg by mouth at bedtime.) 360 tablet 3 08/24/2021  ? HYDROcodone-acetaminophen (NORCO) 10-325 MG tablet Take 1 tablet by mouth 2 (two) times daily as needed for severe pain. 45 tablet 0 08/24/2021  ? loratadine (CLARITIN) 10 MG tablet  Take 10 mg by mouth daily.   08/25/2021  ? metoprolol succinate (TOPROL-XL) 50 MG 24 hr tablet TAKE 1 and 1/2 TABLETS BY MOUTH AT BEDTIME (Patient taking differently: 50 mg at bedtime.) 135 tablet 1 08/24/2021 at 1930  ? mirtazapine (REMERON) 15 MG tablet TAKE 1 TABLET BY MOUTH AT BEDTIME FOR SLEEP 90 tablet 1 08/24/2021  ? montelukast (SINGULAIR) 10 MG tablet TAKE 1 TABLET BY MOUTH AT BEDTIME 90 tablet 1 08/24/2021  ? Multiple Vitamins-Minerals (BARIATRIC MULTIVITAMINS/IRON PO) Take 1 tablet by mouth daily.   Past Week  ? pantoprazole (PROTONIX) 40 MG tablet TAKE 1 TABLET BY MOUTH ONCE DAILY 90 tablet 1 08/24/2021  ? rOPINIRole (REQUIP) 0.5 MG tablet TAKE 1 TABLET BY MOUTH AT BEDTIME MAY INCREASE TO 2 TABS AT BEDTIME AS NEEDED TOLERATED (Patient taking differently: Take 1 mg by mouth at bedtime.) 180 tablet 1 08/24/2021  ? rosuvastatin (CRESTOR) 10 MG tablet TAKE 1 TABLET BY MOUTH ONCE DAILY 90 tablet 1 08/24/2021  ? tiZANidine (ZANAFLEX) 4 MG tablet ake 1 tablet po BID prn muscle spasms. (Patient taking differently: Take 4 mg by mouth at bedtime.) 45 tablet 3 08/24/2021  ? Semaglutide (RYBELSUS) 7 MG TABS Take 7-14 mg by mouth daily. (Patient not taking: Reported on 08/08/2021) 180 tablet 1 Not Taking  ? ? ? ? ? ?Royetta Asal, PharmD, BCPS ?Clinical Pharmacist ?[place] ?Please utilize Amion for appropriate phone number to reach the unit pharmacist ([place]) ?08/25/2021 12:32 PM ? ? ?

## 2021-08-25 NOTE — Op Note (Signed)
Preoperative diagnosis: gastrogastric fistula ? ?Postoperative diagnosis: same  ? ?Procedure: laparoscopic takedown of gastrogastric fistula with gastrojejunostomy revision and partial gastrectomy ? ?Surgeon: Gurney Maxin, M.D. ? ?Asst: Romana Juniper, M.D. ? ?Anesthesia: general ? ?Indications for procedure: Wendy Hubbard is a 66 y.o. year old female with symptoms of abdominal pain, weight regain, and loss of restriction. ? ?Description of procedure: The patient was brought into the operative suite. Anesthesia was administered with General endotracheal anesthesia. WHO checklist was applied. The patient was then placed in supine position. The area was prepped and draped in the usual sterile fashion. ? ?Next, a small left subcostal incision was made.  5 mm trocar was used for access to the abdominal cavity with optical entry.  Laparoscope was reinserted to confirm safe entry.  4 additional trocars were placed one 5 mm trocar in the left lateral space, one 5 mm trocar in the right lateral space, one 12 mm trocar in the right mid abdomen, and one 5 mm trocar in the left mid abdomen.  Bilateral T AP blocks were placed with Exparel Marcaine mix. ? ?There were some adhesions in the infraumbilical area with small intestine.  The liver had some posterior adhesions to the previous gastrojejunal anastomosis.  A Nathanson retractor was placed in the subxiphoid area.  The adhesions to the liver were taken down with scissor and occasional harmonic scalpel as well as blunt dissection.  We are able to visualize the entirety of the posterior aspect of the left lobe of the liver and diaphragm.  Next, the remnant stomach was dissected away from the pouch and GJ.  The end of the Roux limb was identified and freed from surrounding tissue. ? ?The pouch was completely freed from surrounding area and divided just above the anastomosis with blue load stapler.  The Roux was freed from surrounding area and divided with a white load stapler.   Next, remnant stomach short gastrics were divided with harmonic scalpel and all attachments to the fundus to mid body were dissected on both the lateral and medial aspects.  Next to gold 60 mm staplers were used to divide the stomach at the mid antrum.  Omentopexy was performed by suturing the omentum to the remaining antrum with 2-0 Vicryl in 2 places. ? ?Next, a GJ was performed by using a 2-0 Vicryl for posterior layer to oppose the stomach to the new Roux limb.  Gastrotomy and enterotomy were performed and a 30 mm anastomosis was created with a blue load linear stapler.  The defect was closed with 2-0 Vicryl in running fashion.  At this time the Madaline Brilliant was placed across the anastomosis.  An anterior reinforcement was created with 2-0 Vicryl in running fashion.  Madaline Brilliant was removed endoscopy performed showing no leaks at the anastomosis.  The remnant stomach and removed anastomosis were removed in an Endo Catch bag and taken out the 12 mm trocar.  Fascia was closed with 0 Vicryl via a suture passer.  The area of dissection was reinspected there is no bleeding.  Pneumoperitoneum was removed.  All trocars were removed.  All incisions were closed with 4 Monocryl in subcuticular stitch.  Steri-Strips and management placed for dressing.  Patient will from anesthesia brought to PACU in stable condition.  All counts were correct. ? ?Findings: adhesive disease to the lateral and posterior GJ. Negative leak test ? ?Specimen: gastrojejunal anastomosis, remnant gastrectomy ? ?Implant: none  ? ?Blood loss: 20 ml ? ?Local anesthesia:  50 ml Exparel:Marcaine Mix ? ?  Complications: none ? ?Gurney Maxin, M.D. ?General, Bariatric, & Minimally Invasive Surgery ?[facility], [place] ? ?

## 2021-08-25 NOTE — Progress Notes (Signed)
Patient was unable to ambulate from OR stretcher to bed d/t pain and nausea. She c/o 8/10 gassy, abdominal pain PRN given. She has started water and has taken PO meds. She will walk and demonstrate IS use once pain and nausea have decreased. ?

## 2021-08-26 ENCOUNTER — Encounter (HOSPITAL_COMMUNITY): Payer: Self-pay | Admitting: General Surgery

## 2021-08-26 ENCOUNTER — Other Ambulatory Visit: Payer: Self-pay

## 2021-08-26 LAB — CBC WITH DIFFERENTIAL/PLATELET
Abs Immature Granulocytes: 0.04 10*3/uL (ref 0.00–0.07)
Basophils Absolute: 0 10*3/uL (ref 0.0–0.1)
Basophils Relative: 0 %
Eosinophils Absolute: 0.2 10*3/uL (ref 0.0–0.5)
Eosinophils Relative: 2 %
HCT: 34.8 % — ABNORMAL LOW (ref 36.0–46.0)
Hemoglobin: 11.6 g/dL — ABNORMAL LOW (ref 12.0–15.0)
Immature Granulocytes: 0 %
Lymphocytes Relative: 13 %
Lymphs Abs: 1.4 10*3/uL (ref 0.7–4.0)
MCH: 31.8 pg (ref 26.0–34.0)
MCHC: 33.3 g/dL (ref 30.0–36.0)
MCV: 95.3 fL (ref 80.0–100.0)
Monocytes Absolute: 0.6 10*3/uL (ref 0.1–1.0)
Monocytes Relative: 6 %
Neutro Abs: 8.5 10*3/uL — ABNORMAL HIGH (ref 1.7–7.7)
Neutrophils Relative %: 79 %
Platelets: 239 10*3/uL (ref 150–400)
RBC: 3.65 MIL/uL — ABNORMAL LOW (ref 3.87–5.11)
RDW: 12.9 % (ref 11.5–15.5)
WBC: 10.8 10*3/uL — ABNORMAL HIGH (ref 4.0–10.5)
nRBC: 0 % (ref 0.0–0.2)

## 2021-08-26 MED ORDER — PANTOPRAZOLE SODIUM 40 MG IV SOLR
40.0000 mg | Freq: Two times a day (BID) | INTRAVENOUS | Status: DC
  Administered 2021-08-26 – 2021-08-28 (×5): 40 mg via INTRAVENOUS
  Filled 2021-08-26 (×5): qty 10

## 2021-08-26 MED ORDER — METHOCARBAMOL 1000 MG/10ML IJ SOLN
500.0000 mg | Freq: Four times a day (QID) | INTRAMUSCULAR | Status: DC | PRN
  Filled 2021-08-26: qty 5

## 2021-08-26 NOTE — Progress Notes (Signed)
S: Complaining of a lot of gas pain in the left shoulder overnight, this is little bit better and now she has more upper abdominal pain which she describes as achy in quality.  She is also complaining of indigestion, but is tolerating sips of liquids.  She has sat in the chair but has done no walking since surgery. ? ?O: ?Vitals, labs, intake/output, and orders reviewed at this time.  Tmax 99.4, heart rate 65-89, normo-  to hypertensive, sats 94% on room air.  P.o. 300, urine output 1400.  No BMP this morning.  WBC 10.8 (21.7 yesterday immediately postop, 7.9 preop), hemoglobin 11.6 (13.5 preop, 12.9 yesterday afternoon), platelets 239.  ?Has received morphine 2 mg x 5 (13:29, 16:38, 20:14, 01:48, 06:25) ? ? ?Gen: A&Ox3, no distress  ?H&N: EOMI, atraumatic, neck supple ?Chest: unlabored respirations, RRR ?Abd: soft, mildly appropriately tender in the upper abdomen, nondistended, incision(s) c/d/i with no cellulitis or hematoma ?Ext: warm, no edema ?Neuro: grossly normal ? ?Lines/tubes/drains: PIV ? ?A/P: Postop day 1 status post laparoscopic takedown of gastrogastric fistula with reconstruction of gastrojejunostomy and partial gastrectomy ? ?-Continue clear liquids and protein shakes, continue IV fluids for now ?-Patient needs to mobilize today ?-Pulmonary toilet ?-Continue SCDs while in bed, prophylactic Lovenox ?-Change Pepcid to Protonix. ?-Recheck CBC with BMP and magnesium tomorrow ? ?Romana Juniper, MD FACS ?[facility] ? ?  ?

## 2021-08-26 NOTE — Progress Notes (Signed)
Transition of Care (TOC) Screening Note ? ?Patient Details  ?Name: Wendy Hubbard ?Date of Birth: 12-Jun-1955 ? ?Transition of Care (TOC) CM/SW Contact:    ?Sherie Don, LCSW ?Phone Number: ?08/26/2021, 10:27 AM ? ?Transition of Care Department [facility]) has reviewed patient and no TOC needs have been identified at this time. We will continue to monitor patient advancement through interdisciplinary progression rounds. If new patient transition needs arise, please place a TOC consult. ?

## 2021-08-26 NOTE — Progress Notes (Signed)
Patient alert and oriented, pain is controlled. Patient is tolerating fluids, advanced to protein shake today, patient is tolerating well. Reviewed Gastric Bypass discharge instructions with patient and patient is able to articulate understanding. Provided information on BELT program, Support Group and [facility] outpatient pharmacy. All questions answered, will continue to monitor.    

## 2021-08-27 LAB — CBC
HCT: 31.4 % — ABNORMAL LOW (ref 36.0–46.0)
Hemoglobin: 10.4 g/dL — ABNORMAL LOW (ref 12.0–15.0)
MCH: 32.1 pg (ref 26.0–34.0)
MCHC: 33.1 g/dL (ref 30.0–36.0)
MCV: 96.9 fL (ref 80.0–100.0)
Platelets: 225 10*3/uL (ref 150–400)
RBC: 3.24 MIL/uL — ABNORMAL LOW (ref 3.87–5.11)
RDW: 13.1 % (ref 11.5–15.5)
WBC: 9.5 10*3/uL (ref 4.0–10.5)
nRBC: 0 % (ref 0.0–0.2)

## 2021-08-27 LAB — BASIC METABOLIC PANEL
Anion gap: 5 (ref 5–15)
BUN: 7 mg/dL — ABNORMAL LOW (ref 8–23)
CO2: 27 mmol/L (ref 22–32)
Calcium: 8.4 mg/dL — ABNORMAL LOW (ref 8.9–10.3)
Chloride: 109 mmol/L (ref 98–111)
Creatinine, Ser: 0.88 mg/dL (ref 0.44–1.00)
GFR, Estimated: >60 mL/min (ref >60)
Glucose, Bld: 152 mg/dL — ABNORMAL HIGH (ref 70–99)
Potassium: 3.3 mmol/L — ABNORMAL LOW (ref 3.5–5.1)
Sodium: 141 mmol/L (ref 135–145)

## 2021-08-27 LAB — SURGICAL PATHOLOGY

## 2021-08-27 LAB — MAGNESIUM: Magnesium: 2.2 mg/dL (ref 1.7–2.4)

## 2021-08-27 MED ORDER — POTASSIUM CHLORIDE 10 MEQ/100ML IV SOLN
10.0000 meq | INTRAVENOUS | Status: AC
  Administered 2021-08-27 (×2): 10 meq via INTRAVENOUS
  Filled 2021-08-27 (×2): qty 100

## 2021-08-27 MED ORDER — POTASSIUM CHLORIDE 10 MEQ/100ML IV SOLN
10.0000 meq | INTRAVENOUS | Status: AC
  Administered 2021-08-27 (×3): 10 meq via INTRAVENOUS
  Filled 2021-08-27: qty 100

## 2021-08-27 NOTE — Plan of Care (Signed)

## 2021-08-27 NOTE — Progress Notes (Signed)
S: Continues to complain of upper abdominal pain and pressure.  She does feel like overall her pain is improving but feels uncomfortable with discharge today.  She did well with liquids yesterday and is no longer noting indigestion.  Walking with some difficulty. ? ?O: ?Vitals, labs, intake/output, and orders reviewed at this time. Tmax 99.9. HR 77-94, normo to mildly hypertensive. PO 480, UOP 4300. K 3.3, otherwise bmp unremarkable/ WBC normalized 9.5, Hgb 10.4 (11.6 yesterday), plt 225. ?Rec'd morphine x 2 and oxycodone x 2 last 24hr.  ? ?Gen: A&Ox3, no distress  ?H&N: EOMI, atraumatic, neck supple ?Chest: unlabored respirations, RRR ?Abd: soft, mildly diffusely tender, nondistended, incision(s) c/d/i with no cellulitis or hematoma ?Ext: warm, no edema ?Neuro: grossly normal ? ?Lines/tubes/drains: PIV ? ?A/P: Postop day 2 status post laparoscopic takedown of gastrogastric fistula with reconstruction of gastrojejunostomy and partial gastrectomy ? ?-Continue clear liquids and protein shakes, stop IVF ?-Ambulate in halls, pt declined PT eval ?-Pulmonary toilet ?-Continue SCDs while in bed, prophylactic Lovenox ?-slow downtrend in hgb likely equilibration at this point, no clinical concern for active or ongoing blood loss.  Will recheck tomorrow ?-Hypokalemia 3.3- replace IV ? ? ?Romana Juniper, MD FACS ?[facility] ? ?  ?

## 2021-08-28 LAB — CBC
HCT: 32.3 % — ABNORMAL LOW (ref 36.0–46.0)
Hemoglobin: 10.4 g/dL — ABNORMAL LOW (ref 12.0–15.0)
MCH: 31.4 pg (ref 26.0–34.0)
MCHC: 32.2 g/dL (ref 30.0–36.0)
MCV: 97.6 fL (ref 80.0–100.0)
Platelets: 236 10*3/uL (ref 150–400)
RBC: 3.31 MIL/uL — ABNORMAL LOW (ref 3.87–5.11)
RDW: 13 % (ref 11.5–15.5)
WBC: 9.3 10*3/uL (ref 4.0–10.5)
nRBC: 0 % (ref 0.0–0.2)

## 2021-08-28 LAB — BASIC METABOLIC PANEL
Anion gap: 6 (ref 5–15)
BUN: 10 mg/dL (ref 8–23)
CO2: 29 mmol/L (ref 22–32)
Calcium: 9.1 mg/dL (ref 8.9–10.3)
Chloride: 108 mmol/L (ref 98–111)
Creatinine, Ser: 0.8 mg/dL (ref 0.44–1.00)
GFR, Estimated: >60 mL/min (ref >60)
Glucose, Bld: 120 mg/dL — ABNORMAL HIGH (ref 70–99)
Potassium: 3.4 mmol/L — ABNORMAL LOW (ref 3.5–5.1)
Sodium: 143 mmol/L (ref 135–145)

## 2021-08-28 MED ORDER — POTASSIUM CHLORIDE CRYS ER 20 MEQ PO TBCR
30.0000 meq | EXTENDED_RELEASE_TABLET | Freq: Once | ORAL | Status: AC
  Administered 2021-08-28: 30 meq via ORAL
  Filled 2021-08-28: qty 1

## 2021-08-28 MED ORDER — ACETAMINOPHEN 500 MG PO TABS: 1000.0000 mg | ORAL_TABLET | Freq: Three times a day (TID) | ORAL | 0 refills | Status: AC

## 2021-08-28 MED ORDER — ONDANSETRON 4 MG PO TBDP: 4.0000 mg | ORAL_TABLET | Freq: Four times a day (QID) | ORAL | 0 refills | Status: DC | PRN

## 2021-08-28 NOTE — Discharge Summary (Signed)
?Physician Discharge Summary  ?HAYLEIGH BAWA XFG:[id number] DOB: [birth year] DOA: 08/25/2021 ? ?PCP: Ronnell Freshwater, NP ? ?Admit date: 08/25/2021 ?Discharge date: 09/13/2021 ? ?Recommendations for Outpatient Follow-up:  ?none (include homehealth, outpatient follow-up instructions, specific recommendations for PCP to follow-up on, etc.) ? ? Follow-up Information   ? ? Aziah Kaiser, Arta Bruce, MD Follow up in 3 week(s).   ?Specialty: General Surgery ?Contact information: ?[address] ?[address] ?[place] [address] ?[phone] ? ? ?  ?  ? ?  ?  ? ?  ? ?Discharge Diagnoses:  ?Principal Problem: ?  Gastrogastric fistula ? ? ?Surgical Procedure: revision of GJ with partial gastrectomy, upper endoscopy ? ?Discharge Condition: Good ?Disposition: Home ? ?Diet recommendation: Postoperative sleeve gastrectomy diet (liquids only) ? ?Filed Weights  ? 08/25/21 0532 08/25/21 1238 08/25/21 1437  ?Weight: 103.4 kg 103.4 kg 103.4 kg  ? ? ? ?Hospital Course:  ?The patient was admitted after undergoing laparoscopic revision of GJ with partial gastrectomy for gastrogastric fistula. POD 0 she ambulated well. POD 1 she was started on the water diet protocol and tolerated 200 ml in the first shift. She had a small drop in hgb day 1 that stabilized by day 3. She had hypokalemia and was treated with IV and PO supplementation. She had pain issues that improved greatly on day 3. Once meeting the water amount she was advanced to bariatric protein shakes which they tolerated and were discharged home POD 3. ? ?Treatments: surgery: laparoscopic revision of GJ with partial gastrectomy ? ?Discharge Instructions ? ?Discharge Instructions   ? ? Ambulate hourly while awake   Complete by: As directed ?  ? Call MD for:  difficulty breathing, headache or visual disturbances   Complete by: As directed ?  ? Call MD for:  persistant dizziness or light-headedness   Complete by: As directed ?  ? Call MD for:  persistant nausea and vomiting   Complete by: As  directed ?  ? Call MD for:  redness, tenderness, or signs of infection (pain, swelling, redness, odor or green/yellow discharge around incision site)   Complete by: As directed ?  ? Call MD for:  severe uncontrolled pain   Complete by: As directed ?  ? Call MD for:  temperature >101 F   Complete by: As directed ?  ? Diet bariatric full liquid   Complete by: As directed ?  ? Discharge wound care:   Complete by: As directed ?  ? Remove Bandaids tomorrow, ok to shower tomorrow. Steristrips may fall off in 1-3 weeks.  ? Incentive spirometry   Complete by: As directed ?  ? Perform hourly while awake  ? ?  ? ?Allergies as of 08/28/2021   ? ?   Reactions  ? Flagyl [metronidazole] Anaphylaxis, Rash  ? Penicillins Anaphylaxis  ? Doxycycline Hives  ? ?  ? ?  ?Medication List  ?  ? ?TAKE these medications   ? ?alosetron 1 MG tablet ?Commonly known as: LOTRONEX ?Take 1 tablet (1 mg total) by mouth 2 (two) times daily. ?  ?ALPRAZolam 0.5 MG tablet ?Commonly known as: Duanne Moron ?TAKE 1/2 TO 1 TABLET BY MOUTH ONCE DAILYAS NEEDED FOR ACUTE ANXIETY ?  ?BARIATRIC MULTIVITAMINS/IRON PO ?Take 1 tablet by mouth daily. ?  ?Biotin 10000 MCG Tabs ?Take 10,000 mcg by mouth daily. ?  ?buPROPion ER 100 MG 12 hr tablet ?Commonly known as: Wellbutrin SR ?Take 1 tablet (100 mg total) by mouth 2 (two) times daily. ?  ?butalbital-acetaminophen-caffeine 50-325-40  MG tablet ?Commonly known as: FIORICET ?Take 1-2 tablets by mouth every 6 (six) hours as needed for headache. ?  ?diphenoxylate-atropine 2.5-0.025 MG tablet ?Commonly known as: LOMOTIL ?Take 1 tablet by mouth 4 (four) times daily as needed for diarrhea or loose stools. ?  ?escitalopram 10 MG tablet ?Commonly known as: LEXAPRO ?TAKE 1 TABLET DAILY ?  ?finasteride 5 MG tablet ?Commonly known as: PROSCAR ?Take 5 mg by mouth daily. ?  ?furosemide 20 MG tablet ?Commonly known as: LASIX ?Take 20 mg by mouth daily. ?  ?gabapentin 600 MG tablet ?Commonly known as: Neurontin ?Take one AM, one after  lunch, and 2 QHS ?What changed:  ?how much to take ?how to take this ?when to take this ?additional instructions ?  ?HYDROcodone-acetaminophen 10-325 MG tablet ?Commonly known as: NORCO ?Take 1 tablet by mouth 2 (two) times daily as needed for severe pain. ?  ?loratadine 10 MG tablet ?Commonly known as: CLARITIN ?Take 10 mg by mouth daily. ?  ?metoprolol succinate 50 MG 24 hr tablet ?Commonly known as: TOPROL-XL ?TAKE 1 and 1/2 TABLETS BY MOUTH AT BEDTIME ?What changed: See the new instructions. ?  ?mirtazapine 15 MG tablet ?Commonly known as: REMERON ?TAKE 1 TABLET BY MOUTH AT BEDTIME FOR SLEEP ?  ?montelukast 10 MG tablet ?Commonly known as: SINGULAIR ?TAKE 1 TABLET BY MOUTH AT BEDTIME ?  ?ondansetron 4 MG disintegrating tablet ?Commonly known as: ZOFRAN-ODT ?Take 1 tablet (4 mg total) by mouth every 6 (six) hours as needed for nausea or vomiting. ?  ?pantoprazole 40 MG tablet ?Commonly known as: PROTONIX ?TAKE 1 TABLET BY MOUTH ONCE DAILY ?  ?rOPINIRole 0.5 MG tablet ?Commonly known as: REQUIP ?TAKE 1 TABLET BY MOUTH AT BEDTIME MAY INCREASE TO 2 TABS AT BEDTIME AS NEEDED TOLERATED ?What changed:  ?how much to take ?how to take this ?when to take this ?additional instructions ?  ?rosuvastatin 10 MG tablet ?Commonly known as: CRESTOR ?TAKE 1 TABLET BY MOUTH ONCE DAILY ?  ?Rybelsus 7 MG Tabs ?Generic drug: Semaglutide ?Take 7-14 mg by mouth daily. ?  ?tiZANidine 4 MG tablet ?Commonly known as: ZANAFLEX ?ake 1 tablet po BID prn muscle spasms. ?What changed:  ?how much to take ?how to take this ?when to take this ?additional instructions ?  ? ?  ? ?ASK your doctor about these medications   ? ?acetaminophen 500 MG tablet ?Commonly known as: TYLENOL ?Take 2 tablets (1,000 mg total) by mouth every 8 (eight) hours for 5 days. ?Ask about: Should I take this medication? ?  ? ?  ? ?  ?  ? ? ?  ?Discharge Care Instructions  ?(From admission, onward)  ?  ? ? ?  ? ?  Start     Ordered  ? 08/28/21 0000  Discharge wound care:        ?Comments: Remove Bandaids tomorrow, ok to shower tomorrow. Steristrips may fall off in 1-3 weeks.  ? 08/28/21 0830  ? ?  ?  ? ?  ? ? Follow-up Information   ? ? Emersyn Wyss, Arta Bruce, MD Follow up in 3 week(s).   ?Specialty: General Surgery ?Contact information: ?[address] ?[address] ?[place] [address] ?[phone] ? ? ?  ?  ? ?  ?  ? ?  ? ? ? ?The results of significant diagnostics from this hospitalization (including imaging, microbiology, ancillary and laboratory) are listed below for reference.   ? ?Significant Diagnostic Studies: ?No results found. ? ?Labs: ?Basic Metabolic Panel: ?No results  for input(s): NA, K, CL, CO2, GLUCOSE, BUN, CREATININE, CALCIUM, MG, PHOS in the last 168 hours. ? ?Liver Function Tests: ?No results for input(s): AST, ALT, ALKPHOS, BILITOT, PROT, ALBUMIN in the last 168 hours. ? ?CBC: ?No results for input(s): WBC, NEUTROABS, HGB, HCT, MCV, PLT in the last 168 hours. ? ? ?CBG: ?No results for input(s): GLUCAP in the last 168 hours. ? ? ?Principal Problem: ?  Gastrogastric fistula ? ? ?VTE plan: no chemical prophylaxis recommended ?([email or web address]) ? ?Time coordinating discharge: 15 min ? ? ?

## 2021-08-28 NOTE — Progress Notes (Signed)
Assessment unchanged. Pt verbalized understanding of dc instructions through teach back including medications and follow up care. ?Discharged via wc to front entrance accompanied by NT and husband. ?

## 2021-08-28 NOTE — Plan of Care (Signed)

## 2021-08-29 ENCOUNTER — Telehealth (HOSPITAL_COMMUNITY): Payer: Self-pay | Admitting: *Deleted

## 2021-08-29 NOTE — Telephone Encounter (Signed)
1.  Tell me about your pain and pain management? ?Pt c/o  abdominal pain with movement and exertion.  Pt states that she has been taking hydrocodone without relief.  Pt has called [facility] and waiting for a response. Discussed with patient to try and splint her abdomen with changing positions to assist with the discomfort.  Encouraged pt to try options and/or contact [facility] if still concerned.  ? ?2.  Let's talk about fluid intake.  How much total fluid are you taking in? ?Pt states that she is getting in at least 64oz of fluid including protein shakes, bottled water, and broth. Pt instructed to assess status and suggestions daily utilizing Hydration Action Plan on discharge folder and to call [facility] if in the "red zone".  ? ?3.  How much protein have you taken in the last 2 days? ?Pt states that she is working to meet goal of goal of 60g of protein today.  Pt has already consumed one protein shake.  Pt plans to drink remainder of protein throughout the rest of the day to meet goal. ? ?4.  Have you had nausea?  Tell me about when have experienced nausea and what you did to help? ?Pt denies nausea. ?  ?5.  Has the frequency or color changed with your urine? ?Pt states that she is urinating "fine" with no changes in frequency or urgency.   ?  ?6.  Tell me what your incisions look like? ?"Incisions look fine". Pt denies a fever, chills.  Pt states incisions are not swollen, open, or draining.  Pt encouraged to call [facility] if incisions change. ?  ?7.  Have you been passing gas? BM? ?Pt states that she is having BMs. Last BM 08/29/21.   ?  ?8.  If a problem or question were to arise who would you call?  Do you know contact numbers for [facility], [facility], and [facility]? ?Pt denies dehydration symptoms.  Pt can describe s/sx of dehydration.  Pt knows to call [facility] for surgical, [facility] for nutrition, and [facility] for non-urgent questions or concerns. ?  ?9.  How has the walking going? ?Pt states she is walking around and able to be active without difficulty. ?   ?10. Are you still using your incentive spirometer?  If so, how often? ?Pt states that she is doing the I.S and achieving [phone].   Pt encouraged to use incentive spirometer, at least 10x every hour while awake until she sees the surgeon. ? ?11.  How are your vitamins and calcium going?  How are you taking them? ?Pt has been taking MVI already.  Pt is to purchase calcium supplements.  Reinforced education about taking supplements at least two hours apart. ? ?Reminded patient that the first 30 days post-operatively are important for successful recovery.  Practice good hand hygiene, wearing a mask when appropriate (since optional in most places), and minimizing exposure to people who live outside of the home, especially if they are exhibiting any respiratory, GI, or illness-like symptoms.   ? ?

## 2021-09-04 ENCOUNTER — Other Ambulatory Visit: Payer: Self-pay | Admitting: Nurse Practitioner

## 2021-09-04 DIAGNOSIS — I739 Peripheral vascular disease, unspecified: Secondary | ICD-10-CM

## 2021-09-09 ENCOUNTER — Encounter: Payer: Medicare Other | Attending: General Surgery | Admitting: Skilled Nursing Facility1

## 2021-09-09 ENCOUNTER — Encounter: Payer: Self-pay | Admitting: Skilled Nursing Facility1

## 2021-09-09 DIAGNOSIS — E119 Type 2 diabetes mellitus without complications: Secondary | ICD-10-CM | POA: Insufficient documentation

## 2021-09-09 DIAGNOSIS — E669 Obesity, unspecified: Secondary | ICD-10-CM | POA: Insufficient documentation

## 2021-09-09 NOTE — Progress Notes (Signed)
2 Week Post-Operative Nutrition Class ?  ?Patient was seen on 09/09/2021 for Post-Operative Nutrition education at the [facility]  This was the patients first visit to [facility]. ? 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  ?Surgery date: 08/25/2021 ?Surgery type: Laparoscopic redo of stomach to intestine; RYGB original surgery 2017 ?Start weight at [facility]: 211.6 ?Weight today: 211.6 ?Ht: 64" ?  ?Body Composition Scale Date  ?Current Body Weight 211.6  ?Total Body Fat % 45.0  ?Visceral Fat 16  ?Fat-Free Mass % 54.9  ? Total Body Water % 41.9  ?Muscle-Mass lbs 27.2  ?BMI 36.2  ?Body Fat Displacement   ?       Torso  lbs 58.9  ?       Left Leg  lbs 11.7  ?       Right Leg  lbs 11.7  ?       Left Arm  lbs 5.8  ?       Right Arm   lbs 5.8  ? ?Pt states she has had diarrhea for two weeks. ?Dietitian advised Pt of possible thiamin, potasium, and other electrolyte lows, due to diarrhea for two weeks. ?Dietitian advised Pt to only consume soft protein foods until Monday.  Dietitian to follow-up on Monday with a call to Pt.  ?  ?The following the learning objectives were met by the patient during this course: ?Identifies Phase 3 (Soft, High Proteins) Dietary Goals and will begin from 2 weeks post-operatively to 2 months post-operatively ?Identifies appropriate sources of fluids and proteins  ?Identifies appropriate fat sources and healthy verses unhealthy fat types   ?States protein recommendations and appropriate sources post-operatively ?Identifies the need for appropriate texture modifications, mastication, and bite sizes when consuming solids ?Identifies appropriate fat consumption and sources ?Identifies appropriate multivitamin and calcium sources post-operatively ?Describes the need for physical activity post-operatively and will follow MD recommendations ?States when to call healthcare provider regarding medication questions or post-operative complications ?  ?Handouts given  during class include: ?Phase 3A: Soft, High Protein Diet Handout ?Phase 3 High Protein Meals ?Healthy Fats ?  ?Follow-Up Plan: ?Patient will follow-up at [facility] in 6 weeks for 2 month post-op nutrition visit for diet advancement per MD.  ?

## 2021-09-15 ENCOUNTER — Telehealth: Payer: Self-pay | Admitting: Skilled Nursing Facility1

## 2021-09-15 ENCOUNTER — Other Ambulatory Visit: Payer: Self-pay | Admitting: Nurse Practitioner

## 2021-09-15 DIAGNOSIS — F3341 Major depressive disorder, recurrent, in partial remission: Secondary | ICD-10-CM

## 2021-09-15 NOTE — Telephone Encounter (Signed)
RD called pt to verify fluid intake once starting soft, solid proteins 2 week post-bariatric surgery.  ? ?Daily Fluid intake: 64+ oz. ?Daily Protein intake: 60 oz. ?Bowel Habits:  ? ?Concerns/issues:  diarrhea is gone with solid foods ?

## 2021-09-29 ENCOUNTER — Other Ambulatory Visit: Payer: Self-pay | Admitting: Nurse Practitioner

## 2021-09-29 ENCOUNTER — Ambulatory Visit: Payer: Medicare Other | Admitting: Nurse Practitioner

## 2021-09-29 DIAGNOSIS — M7918 Myalgia, other site: Secondary | ICD-10-CM

## 2021-09-29 DIAGNOSIS — M792 Neuralgia and neuritis, unspecified: Secondary | ICD-10-CM

## 2021-09-29 DIAGNOSIS — I10 Essential (primary) hypertension: Secondary | ICD-10-CM

## 2021-09-29 DIAGNOSIS — R002 Palpitations: Secondary | ICD-10-CM

## 2021-09-29 DIAGNOSIS — G2581 Restless legs syndrome: Secondary | ICD-10-CM

## 2021-09-29 MED ORDER — FUROSEMIDE 20 MG PO TABS: 20.0000 mg | ORAL_TABLET | Freq: Every day | ORAL | 0 refills | Status: DC

## 2021-09-29 NOTE — Telephone Encounter (Signed)
She is going to have  to be seen for refills because it has been since march. can get controlled med agreement signed then. She is going to be seen per [facility] on 6/5 for further evaluation and treatment of neuropathic pain. In meantime, can you send refill for her furosemide? Please and thank you. -HB

## 2021-09-30 NOTE — Telephone Encounter (Signed)
I refused the hydrocodone stating she needs an appointment. Will get signed controlled substances agreement with her and will discuss her visit with neurology as they should now start treating her for the neuropathy.

## 2021-10-03 ENCOUNTER — Other Ambulatory Visit: Payer: Self-pay | Admitting: Nurse Practitioner

## 2021-10-03 DIAGNOSIS — F331 Major depressive disorder, recurrent, moderate: Secondary | ICD-10-CM

## 2021-10-08 ENCOUNTER — Telehealth: Payer: Self-pay | Admitting: Nurse Practitioner

## 2021-10-08 NOTE — Telephone Encounter (Signed)
Patient requesting refill of Alprazolam and Hydrocodone to be sent to [facility] in [place]. AS, CMA

## 2021-10-08 NOTE — Telephone Encounter (Signed)
She really needs to be seen for this. I need her to sign controlled substances policy.  She has been referred to neurology for the peripheral neuropathy she has which is so severe, but first appointment is not until 6/5.

## 2021-10-09 ENCOUNTER — Other Ambulatory Visit: Payer: Self-pay | Admitting: Nurse Practitioner

## 2021-10-09 DIAGNOSIS — M792 Neuralgia and neuritis, unspecified: Secondary | ICD-10-CM

## 2021-10-09 DIAGNOSIS — F411 Generalized anxiety disorder: Secondary | ICD-10-CM

## 2021-10-09 MED ORDER — ALPRAZOLAM 0.5 MG PO TABS: ORAL_TABLET | ORAL | 0 refills | Status: DC

## 2021-10-09 MED ORDER — HYDROCODONE-ACETAMINOPHEN 10-325 MG PO TABS: 1.0000 | ORAL_TABLET | Freq: Two times a day (BID) | ORAL | 0 refills | Status: DC | PRN

## 2021-10-09 NOTE — Progress Notes (Signed)
Refilled alprazolam and hydrocodone for patient until she is able to be seen in office 6/15

## 2021-10-09 NOTE — Telephone Encounter (Signed)
I filled these until I can see her in the office on 6/15. She will have seen neurology by then. I will have her sign the opioid agreement and I will refer her to pain management if needed. Thanks

## 2021-10-10 ENCOUNTER — Telehealth: Payer: Self-pay | Admitting: Dietician

## 2021-10-10 NOTE — Telephone Encounter (Signed)
Pt called [facility] to notify us that she is less than two week out from her 2 month post op appointment (when diet would be advanced to non-starchy vegetables), and going on a cruise for a week starting tomorrow.  Pt desires to have non-starchy vegetables on the cruise. Dietitian advised pt take it slow and start with cooked vegetables and advance with other non-starchy vegetables as tolerated. Dietitian advised Pt to avoid starchy vegetables (corn, peas, potatoes). Pt states she is tolerating protein and is meeting her needs.  Pt states her bowel movements are normal.  Dietitian advised Pt to call or e-mail with any concerns or questions.

## 2021-10-13 ENCOUNTER — Ambulatory Visit: Payer: Medicare Other | Admitting: Diagnostic Neuroimaging

## 2021-10-21 ENCOUNTER — Encounter: Payer: Self-pay | Admitting: Skilled Nursing Facility1

## 2021-10-21 ENCOUNTER — Encounter: Payer: Medicare Other | Attending: Nurse Practitioner | Admitting: Skilled Nursing Facility1

## 2021-10-21 DIAGNOSIS — E119 Type 2 diabetes mellitus without complications: Secondary | ICD-10-CM | POA: Insufficient documentation

## 2021-10-21 DIAGNOSIS — E669 Obesity, unspecified: Secondary | ICD-10-CM | POA: Diagnosis present

## 2021-10-21 LAB — HM DIABETES EYE EXAM

## 2021-10-21 NOTE — Progress Notes (Signed)
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    Surgery date: 08/25/2021 Surgery type: Laparoscopic redo of stomach to intestine; RYGB original surgery 2017 Start weight at [facility]: 211.6 Weight today: 206.8 pounds    Body Composition Scale Date 10/21/2021  Current Body Weight 211.6 206.8  Total Body Fat % 45.0 43.4  Visceral Fat 16 15  Fat-Free Mass % 54.9 56.5   Total Body Water % 41.9 42.7  Muscle-Mass lbs 27.2 28.2  BMI 36.2 35.3  Body Fat Displacement           Torso  lbs 58.9 55.5         Left Leg  lbs 11.7 11.1         Right Leg  lbs 11.7 11.1         Left Arm  lbs 5.8 5.5         Right Arm   lbs 5.8 5.5     Lifestyle & Dietary Hx  Pt state she just got back from a cruise.   Pt states she feels really well.  Pt states with her neuropathy she will be able to increase her walks to 45 minutes. Pt states she does plan on joining a gym that is for woman only.    Estimated daily fluid intake: 64 oz Estimated daily protein intake: 60+ g Supplements: multi and calcium  Current average weekly physical activity: walking 3 days a week 15 minutes    24-Hr Dietary Recall First Meal: canadian bacon and egg Snack:  cottage cheese or greek yogurt Second Meal: lentil soup Snack:   Third Meal: chicken or seafood or pork or beef + salad or green beans Snack:  Beverages: sugar free twist, water, coffee + sugar free creamer  Post-Op Goals/ Signs/ Symptoms Using straws: no Drinking while eating: no Chewing/swallowing difficulties: no Changes in vision: no Changes to mood/headaches: no Hair loss/changes to skin/nails: no Difficulty focusing/concentrating: no Sweating: no Limb weakness: no Dizziness/lightheadedness: no Palpitations: no  Carbonated/caffeinated beverages: no N/V/D/C/Gas: some diarrhea here or there from IBS Abdominal pain: no Dumping syndrome: no     NUTRITION DIAGNOSIS  Overweight/obesity ([place]-3.3) related to past poor dietary habits and physical inactivity as evidenced by completed bariatric surgery and following dietary guidelines for continued weight loss and healthy nutrition status.     NUTRITION INTERVENTION Nutrition counseling (C-1) and education (E-2) to facilitate bariatric surgery goals, including: The importance of consuming adequate calories as well as certain nutrients daily due to the body's need for essential vitamins, minerals, and fats The importance of daily physical activity and to reach a goal of at least 150 minutes of moderate to vigorous physical activity weekly (or as directed by their physician) due to benefits such as increased musculature and improved lab values The importance of intuitive eating specifically learning hunger-satiety cues and understanding the importance of learning a new body: The importance of mindful eating to avoid grazing behaviors  Encouraged patient to honor their body's internal hunger and fullness cues.  Throughout the day, check in mentally and rate hunger. Stop eating when satisfied not full regardless of how much food is left on the plate.  Get more if still hungry 20-30 minutes later.  The key is to honor satisfaction so throughout the meal, rate fullness factor and stop when comfortably satisfied not physically full. The key is to honor hunger and fullness without any feelings of guilt or  shame.  Pay attention to what the internal cues are, rather than any external factors. This will enhance the confidence you have in listening to your own body and following those internal cues enabling you to increase how often you eat when you are hungry not out of appetite and stop when you are satisfied not full.  Encouraged pt to continue to eat balanced meals inclusive of non starchy vegetables 2 times a day 7 days a week Encouraged pt to choose lean protein sources: limiting beef, pork, sausage,  hotdogs, and lunch meat Encourage pt to choose healthy fats such as plant based limiting animal fats Encouraged pt to continue to drink a minium 64 fluid ounces with half being plain water to satisfy proper hydration  Learning Style & Readiness for Change Teaching method utilized: Visual & Auditory  Demonstrated degree of understanding via: Teach Back  Readiness Level: action Barriers to learning/adherence to lifestyle change: none identified   RD's Notes for Next Visit Assess adherence to pt chosen goals    MONITORING & EVALUATION Dietary intake, weekly physical activity, body weight  Next Steps Patient is to follow-up in October

## 2021-10-23 ENCOUNTER — Encounter: Payer: Self-pay | Admitting: Nurse Practitioner

## 2021-10-23 ENCOUNTER — Ambulatory Visit (INDEPENDENT_AMBULATORY_CARE_PROVIDER_SITE_OTHER): Payer: Medicare Other | Admitting: Nurse Practitioner

## 2021-10-23 VITALS — BP 95/60 | HR 70 | Temp 97.8°F | Ht 64.17 in | Wt 207.2 lb

## 2021-10-23 DIAGNOSIS — Z6835 Body mass index (BMI) 35.0-35.9, adult: Secondary | ICD-10-CM | POA: Diagnosis not present

## 2021-10-23 DIAGNOSIS — R1084 Generalized abdominal pain: Secondary | ICD-10-CM

## 2021-10-23 DIAGNOSIS — E1165 Type 2 diabetes mellitus with hyperglycemia: Secondary | ICD-10-CM

## 2021-10-23 DIAGNOSIS — M792 Neuralgia and neuritis, unspecified: Secondary | ICD-10-CM

## 2021-10-23 DIAGNOSIS — G629 Polyneuropathy, unspecified: Secondary | ICD-10-CM | POA: Diagnosis not present

## 2021-10-23 LAB — POCT GLYCOSYLATED HEMOGLOBIN (HGB A1C): Hemoglobin A1C: 5.7 % — AB (ref 4.0–5.6)

## 2021-10-23 MED ORDER — HYDROCODONE-ACETAMINOPHEN 10-325 MG PO TABS: 1.0000 | ORAL_TABLET | Freq: Two times a day (BID) | ORAL | 0 refills | Status: DC | PRN

## 2021-10-23 MED ORDER — OZEMPIC (0.25 OR 0.5 MG/DOSE) 2 MG/3ML ~~LOC~~ SOPN: 0.2500 mg | PEN_INJECTOR | SUBCUTANEOUS | 1 refills | Status: DC

## 2021-10-23 MED ORDER — DULOXETINE HCL 20 MG PO CPEP: 20.0000 mg | ORAL_CAPSULE | Freq: Every day | ORAL | 3 refills | Status: DC

## 2021-10-23 NOTE — Progress Notes (Unsigned)
Established patient visit   Patient: Wendy Hubbard   DOB: 1955-12-12   67 y.o. Female  MRN: [id number] Visit Date: 10/23/2021   Chief Complaint  Patient presents with   Follow-up   Subjective    HPI  Patient presents for follow up.  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 Initial surgery 08/25/2021. Fistula was removed and intestines were rerouted. Continues to have some trouble with diarrhea. This is gradually improving.  -she is now back to work. Only missed two weeks of work.  -referred to nutrition services per surgeon. Saw nutritionist last week.  -chronic and severe neuropathy. Was to see neurology due to severity of symptoms and difficulty controlling. She did cancel initial appointment and does need to call and reschedule. Taking requip due to severe restless legs. Getting bad duringthe day as well as at night. Pain is so severe in her legs at night. Indication for chronic opioid: *** Medication and dose: *** # pills per month: *** Last UDS date: *** Opioid Treatment Agreement signed (Y/N): *** Opioid Treatment Agreement last reviewed with patient:   N[facility] reviewed this encounter (include red flags): Yes  -has last 21 pounds since her initial surgery date. Eating very litle.  -blood pressure on low side since weight loss has restarted. Heart rate is normal.   Medications: Outpatient Medications Prior to Visit  Medication Sig   alosetron (LOTRONEX) 1 MG tablet Take 1 tablet (1 mg total) by mouth 2 (two) times daily. (Patient taking differently: Take 1 mg by mouth 2 (two) times daily.)   ALPRAZolam (XANAX) 0.5 MG tablet TAKE 1/2 TO 1 TABLET BY MOUTH ONCE DAILYAS NEEDED FOR ACUTE ANXIETY   Biotin 10000 MCG TABS Take 10,000 mcg by mouth daily.   buPROPion ER (WELLBUTRIN SR) 100 MG 12 hr tablet TAKE 1 TABLET BY MOUTH TWICE DAILY   butalbital-acetaminophen-caffeine (FIORICET) 50-325-40 MG tablet Take 1-2 tablets by mouth every 6  (six) hours as needed for headache.   cilostazol (PLETAL) 100 MG tablet TAKE 1 TABLET TWICE A DAY   diphenoxylate-atropine (LOMOTIL) 2.5-0.025 MG tablet Take 1 tablet by mouth 4 (four) times daily as needed for diarrhea or loose stools.   escitalopram (LEXAPRO) 10 MG tablet TAKE 1 TABLET DAILY   finasteride (PROSCAR) 5 MG tablet Take 5 mg by mouth daily.   furosemide (LASIX) 20 MG tablet Take 1 tablet (20 mg total) by mouth daily.   HYDROcodone-acetaminophen (NORCO) 10-325 MG tablet Take 1 tablet by mouth 2 (two) times daily as needed for severe pain.   loratadine (CLARITIN) 10 MG tablet Take 10 mg by mouth daily.   metoprolol succinate (TOPROL-XL) 50 MG 24 hr tablet TAKE 1 and 1/2 TABLETS BY MOUTH AT BEDTIME   mirtazapine (REMERON) 15 MG tablet TAKE 1 TABLET BY MOUTH AT BEDTIME FOR SLEEP   montelukast (SINGULAIR) 10 MG tablet TAKE 1 TABLET BY MOUTH AT BEDTIME   Multiple Vitamins-Minerals (BARIATRIC MULTIVITAMINS/IRON PO) Take 1 tablet by mouth daily.   ondansetron (ZOFRAN-ODT) 4 MG disintegrating tablet Take 1 tablet (4 mg total) by mouth every 6 (six) hours as needed for nausea or vomiting.   pantoprazole (PROTONIX) 40 MG tablet TAKE 1 TABLET BY MOUTH ONCE DAILY   rOPINIRole (REQUIP) 0.5 MG tablet TAKE 1 TABLET BY MOUTH AT BEDTIME MAY INCREASE TO 2 TABS AT BEDTIME AS NEEDED TOLERATED   rosuvastatin (CRESTOR) 10 MG tablet TAKE 1 TABLET BY MOUTH ONCE DAILY   tiZANidine (ZANAFLEX) 4 MG tablet  Take 1 tablet (4 mg total) by mouth at bedtime.   gabapentin (NEURONTIN) 600 MG tablet Take one AM, one after lunch, and 2 QHS (Patient not taking: Reported on 10/23/2021)   Semaglutide (RYBELSUS) 7 MG TABS Take 7-14 mg by mouth daily. (Patient not taking: Reported on 08/08/2021)   No facility-administered medications prior to visit.    Review of Systems  {Labs (Optional):23779}   Objective     Today's Vitals   10/23/21 0919  BP: 95/60  Pulse: 70  Temp: 97.8 F (36.6 C)  SpO2: 95%  Weight: 207  lb 3.2 oz (94 kg)  Height: 5' 4.17" (1.63 m)   Body mass index is 35.37 kg/m.  BP Readings from Last 3 Encounters:  10/23/21 95/60  08/28/21 (!) 147/61  08/12/21 (!) 144/84    Wt Readings from Last 3 Encounters:  10/23/21 207 lb 3.2 oz (94 kg)  10/21/21 206 lb 12.8 oz (93.8 kg)  09/09/21 211 lb 9.6 oz (96 kg)    Physical Exam  ***  Results for orders placed or performed in visit on 10/23/21  POCT glycosylated hemoglobin (Hb A1C)  Result Value Ref Range   Hemoglobin A1C 5.7 (A) 4.0 - 5.6 %   HbA1c POC (<> result, manual entry)     HbA1c, POC (prediabetic range)     HbA1c, POC (controlled diabetic range)      Assessment & Plan     Problem List Items Addressed This Visit       Endocrine   Uncontrolled type 2 diabetes mellitus with hyperglycemia (HCC) - Primary   Relevant Orders   POCT glycosylated hemoglobin (Hb A1C) (Completed)     No follow-ups on file.         Ronnell Freshwater, NP  [facility] at [facility] [phone] (phone) [phone] (fax)  [place]

## 2021-10-27 ENCOUNTER — Telehealth: Payer: Self-pay | Admitting: Nurse Practitioner

## 2021-10-27 NOTE — Telephone Encounter (Signed)
Called pt she stated that she is feeling better she said she will go to UC or ER if she experience another episode

## 2021-10-27 NOTE — Telephone Encounter (Signed)
Patient got up this morning feeling dizzy so she took her bp and it was 85/47. Patient states she is not dehydrated but does not know what to do. Please advise.

## 2021-10-31 ENCOUNTER — Other Ambulatory Visit: Payer: Self-pay | Admitting: Nurse Practitioner

## 2021-10-31 ENCOUNTER — Telehealth: Payer: Self-pay | Admitting: Nurse Practitioner

## 2021-10-31 ENCOUNTER — Other Ambulatory Visit: Payer: Self-pay

## 2021-10-31 DIAGNOSIS — M7918 Myalgia, other site: Secondary | ICD-10-CM

## 2021-10-31 DIAGNOSIS — I739 Peripheral vascular disease, unspecified: Secondary | ICD-10-CM

## 2021-10-31 MED ORDER — CILOSTAZOL 100 MG PO TABS: 100.0000 mg | ORAL_TABLET | Freq: Two times a day (BID) | ORAL | 3 refills | Status: DC

## 2021-11-17 ENCOUNTER — Telehealth: Payer: Self-pay | Admitting: Nurse Practitioner

## 2021-11-17 NOTE — Telephone Encounter (Signed)
Needs to be faxed to Benjamin Stain at [phone]

## 2021-11-17 NOTE — Telephone Encounter (Signed)
Patient is requesting letter of exemption for [employer] Duty due to her recent stomach surgery and the neuropathy. Please advise.

## 2021-11-29 ENCOUNTER — Other Ambulatory Visit: Payer: Self-pay

## 2021-11-29 ENCOUNTER — Emergency Department
Admission: EM | Admit: 2021-11-29 | Discharge: 2021-11-29 | Disposition: A | Discharge status: Home / Self Care | Payer: Medicare Other | Attending: Emergency Medicine | Admitting: Emergency Medicine

## 2021-11-29 ENCOUNTER — Emergency Department: Payer: Medicare Other

## 2021-11-29 ENCOUNTER — Other Ambulatory Visit: Payer: Self-pay | Admitting: Nurse Practitioner

## 2021-11-29 DIAGNOSIS — M79662 Pain in left lower leg: Secondary | ICD-10-CM | POA: Diagnosis not present

## 2021-11-29 DIAGNOSIS — M4802 Spinal stenosis, cervical region: Secondary | ICD-10-CM | POA: Diagnosis not present

## 2021-11-29 DIAGNOSIS — R079 Chest pain, unspecified: Secondary | ICD-10-CM | POA: Diagnosis not present

## 2021-11-29 DIAGNOSIS — M47812 Spondylosis without myelopathy or radiculopathy, cervical region: Secondary | ICD-10-CM | POA: Diagnosis not present

## 2021-11-29 DIAGNOSIS — M542 Cervicalgia: Secondary | ICD-10-CM | POA: Diagnosis not present

## 2021-11-29 DIAGNOSIS — M25561 Pain in right knee: Secondary | ICD-10-CM

## 2021-11-29 DIAGNOSIS — S0990XA Unspecified injury of head, initial encounter: Secondary | ICD-10-CM | POA: Diagnosis not present

## 2021-11-29 DIAGNOSIS — S80212A Abrasion, left knee, initial encounter: Secondary | ICD-10-CM | POA: Insufficient documentation

## 2021-11-29 DIAGNOSIS — S80211A Abrasion, right knee, initial encounter: Secondary | ICD-10-CM | POA: Insufficient documentation

## 2021-11-29 DIAGNOSIS — M546 Pain in thoracic spine: Secondary | ICD-10-CM | POA: Insufficient documentation

## 2021-11-29 DIAGNOSIS — Y92480 Sidewalk as the place of occurrence of the external cause: Secondary | ICD-10-CM | POA: Insufficient documentation

## 2021-11-29 DIAGNOSIS — R0789 Other chest pain: Secondary | ICD-10-CM | POA: Insufficient documentation

## 2021-11-29 DIAGNOSIS — M25562 Pain in left knee: Secondary | ICD-10-CM | POA: Diagnosis not present

## 2021-11-29 DIAGNOSIS — M47816 Spondylosis without myelopathy or radiculopathy, lumbar region: Secondary | ICD-10-CM | POA: Diagnosis not present

## 2021-11-29 DIAGNOSIS — M792 Neuralgia and neuritis, unspecified: Secondary | ICD-10-CM

## 2021-11-29 DIAGNOSIS — S14109A Unspecified injury at unspecified level of cervical spinal cord, initial encounter: Secondary | ICD-10-CM | POA: Diagnosis not present

## 2021-11-29 DIAGNOSIS — S0003XA Contusion of scalp, initial encounter: Secondary | ICD-10-CM | POA: Diagnosis not present

## 2021-11-29 DIAGNOSIS — Z043 Encounter for examination and observation following other accident: Secondary | ICD-10-CM | POA: Diagnosis not present

## 2021-11-29 DIAGNOSIS — S8991XA Unspecified injury of right lower leg, initial encounter: Secondary | ICD-10-CM | POA: Diagnosis not present

## 2021-11-29 DIAGNOSIS — W108XXA Fall (on) (from) other stairs and steps, initial encounter: Secondary | ICD-10-CM | POA: Insufficient documentation

## 2021-11-29 DIAGNOSIS — W19XXXA Unspecified fall, initial encounter: Secondary | ICD-10-CM

## 2021-11-29 MED ORDER — CYCLOBENZAPRINE HCL 10 MG PO TABS
5.0000 mg | ORAL_TABLET | Freq: Once | ORAL | Status: AC
  Administered 2021-11-29: 5 mg via ORAL
  Filled 2021-11-29: qty 1

## 2021-11-29 MED ORDER — IBUPROFEN 600 MG PO TABS
600.0000 mg | ORAL_TABLET | Freq: Once | ORAL | Status: DC
  Filled 2021-11-29: qty 1

## 2021-11-29 MED ORDER — TIZANIDINE HCL 4 MG PO TABS: 4.0000 mg | ORAL_TABLET | Freq: Three times a day (TID) | ORAL | 0 refills | Status: AC

## 2021-11-29 MED ORDER — OXYCODONE-ACETAMINOPHEN 5-325 MG PO TABS
1.0000 | ORAL_TABLET | Freq: Once | ORAL | Status: AC
  Administered 2021-11-29: 1 via ORAL
  Filled 2021-11-29: qty 1

## 2021-11-29 NOTE — ED Triage Notes (Signed)
Pt via POV from home. Pt c/o mechanical fall down 3 brick steps, states she fell on his R side, pt also did hit her head. Denies LOC. Pt c/o head pain, upper back pain, bilateral knee pain, and chest pain. Denies NVD. Denies vision changes. Pt is A&Ox4 and NAD

## 2021-11-29 NOTE — ED Notes (Signed)
The pt was placed in a soft cervical collar per Dr. Cheri Fowler.

## 2021-11-29 NOTE — ED Notes (Addendum)
First Nurse Note: Pt to ED via POV, pt states that she fell down 3 brick steps. Pt states that she did hit her head. Pt is on Pletal

## 2021-11-29 NOTE — ED Provider Triage Note (Signed)
Emergency Medicine Provider Triage Evaluation Note  SYBILLA MALHOTRA , a 66 y.o. female  was evaluated in triage.  Pt complains of bilateral knee pain, chest wall pain, head and upper back pain. Fell on [employer] while trying to move refrigerator and hit her head on a rock.  She denies any loss of consciousness.  Review of Systems  Positive: + DM.   Negative: No N, V or vision changes since falling.  No blood thinners.   Physical Exam  There were no vitals taken for this visit. Gen:   Awake, no distress crying, tearful, alert and answering questions. Resp:  Normal effort  MSK:   Moves extremities slowly, but able to stand and ambulate.  No deformities noted but patient tender multiple area including thoracic spine, bilateral lower legs.   Other:    Medical Decision Making  Medically screening exam initiated at 10:30 AM.  Appropriate orders placed.  Aariana Shankland Lein was informed that the remainder of the evaluation will be completed by another provider, this initial triage assessment does not replace that evaluation, and the importance of remaining in the ED until their evaluation is complete.     Johnn Hai, PA-C 11/29/21 1109

## 2021-11-29 NOTE — Discharge Instructions (Addendum)
Please increase your tizanidine dosing from twice a day to 3 times a day for the next 5-7 days until symptoms of acute trauma resolved. Please use no more than 4000 mg of Tylenol in a 24-hour period.  That includes the Tylenol that is contained in your Norco

## 2021-12-01 ENCOUNTER — Other Ambulatory Visit: Payer: Self-pay | Admitting: Nurse Practitioner

## 2021-12-01 ENCOUNTER — Telehealth: Payer: Self-pay | Admitting: *Deleted

## 2021-12-01 NOTE — Chronic Care Management (AMB) (Signed)
  Care Coordination  Note  12/01/2021 Name: KINDELL STRADA MRN: [id number] DOB: June 02, 1955  Hall Busing Zirkelbach is a 66 y.o. year old female who is a primary care patient of Ronnell Freshwater, NP. I reached out to [employer] by phone today to offer care coordination services.      Ms. Freeze was given information about Care Coordination services today including:  The Care Coordination services include support from the care team which includes your Nurse Coordinator, Clinical Social Worker, or Pharmacist.  The Care Coordination team is here to help remove barriers to the health concerns and goals most important to you. Care Coordination services are voluntary and the patient may decline or stop services at any time by request to their care team member.   Patient did not agree to participate in care coordination services at this time.   [place]  Direct Dial: [phone]

## 2021-12-01 NOTE — Telephone Encounter (Signed)
Patient is aware of the below and plans to discuss this with Nira Conn when she comes back. AS, CMA

## 2021-12-04 ENCOUNTER — Ambulatory Visit: Payer: Medicare Other | Admitting: Nurse Practitioner

## 2021-12-08 ENCOUNTER — Telehealth: Payer: Self-pay | Admitting: Nurse Practitioner

## 2021-12-08 ENCOUNTER — Encounter: Payer: Self-pay | Admitting: Nurse Practitioner

## 2021-12-08 NOTE — Telephone Encounter (Signed)
Patient requesting refill of Hydrocodone. Please advise.

## 2021-12-08 NOTE — Telephone Encounter (Signed)
She was not advised to increase dose of hydrocodone to every 6 ours as needed. Those instructions did not come from our office. Also, she has been referred to neurology to further assess her severe neuropathy.

## 2021-12-08 NOTE — Telephone Encounter (Signed)
Please let her know that I cannot refill this for her at this time. She has been referred to neurology due to the severe neuropathy in her feet which is not being controlled. She needs to see the neurologist to further evaluate her pain, the reason, and for better management. She has appointment here 12/10/2021.

## 2021-12-08 NOTE — Telephone Encounter (Signed)
Patient aware.

## 2021-12-09 DIAGNOSIS — M25511 Pain in right shoulder: Secondary | ICD-10-CM | POA: Diagnosis not present

## 2021-12-09 DIAGNOSIS — M898X1 Other specified disorders of bone, shoulder: Secondary | ICD-10-CM | POA: Insufficient documentation

## 2021-12-10 ENCOUNTER — Ambulatory Visit (INDEPENDENT_AMBULATORY_CARE_PROVIDER_SITE_OTHER): Payer: Medicare Other | Admitting: Nurse Practitioner

## 2021-12-10 ENCOUNTER — Encounter: Payer: Self-pay | Admitting: Nurse Practitioner

## 2021-12-10 VITALS — BP 104/68 | HR 78 | Temp 97.7°F | Ht 64.0 in | Wt 195.0 lb

## 2021-12-10 DIAGNOSIS — M25511 Pain in right shoulder: Secondary | ICD-10-CM

## 2021-12-10 DIAGNOSIS — G629 Polyneuropathy, unspecified: Secondary | ICD-10-CM

## 2021-12-10 DIAGNOSIS — M5442 Lumbago with sciatica, left side: Secondary | ICD-10-CM | POA: Diagnosis not present

## 2021-12-10 DIAGNOSIS — W108XXD Fall (on) (from) other stairs and steps, subsequent encounter: Secondary | ICD-10-CM

## 2021-12-10 DIAGNOSIS — M7918 Myalgia, other site: Secondary | ICD-10-CM | POA: Diagnosis not present

## 2021-12-10 DIAGNOSIS — M792 Neuralgia and neuritis, unspecified: Secondary | ICD-10-CM

## 2021-12-10 DIAGNOSIS — M542 Cervicalgia: Secondary | ICD-10-CM

## 2021-12-10 DIAGNOSIS — L659 Nonscarring hair loss, unspecified: Secondary | ICD-10-CM

## 2021-12-10 DIAGNOSIS — M5441 Lumbago with sciatica, right side: Secondary | ICD-10-CM

## 2021-12-10 DIAGNOSIS — G2581 Restless legs syndrome: Secondary | ICD-10-CM | POA: Diagnosis not present

## 2021-12-10 DIAGNOSIS — E1165 Type 2 diabetes mellitus with hyperglycemia: Secondary | ICD-10-CM | POA: Diagnosis not present

## 2021-12-10 MED ORDER — DULOXETINE HCL 40 MG PO CPEP: 40.0000 mg | ORAL_CAPSULE | Freq: Every day | ORAL | 2 refills | Status: DC

## 2021-12-10 MED ORDER — OZEMPIC (0.25 OR 0.5 MG/DOSE) 2 MG/3ML ~~LOC~~ SOPN: 0.2500 mg | PEN_INJECTOR | SUBCUTANEOUS | 1 refills | Status: DC

## 2021-12-10 MED ORDER — PREDNISONE 10 MG (48) PO TBPK: ORAL_TABLET | ORAL | 0 refills | Status: DC

## 2021-12-10 MED ORDER — METHYLPREDNISOLONE SODIUM SUCC 40 MG IJ SOLR
40.0000 mg | Freq: Once | INTRAMUSCULAR | Status: AC
  Administered 2021-12-10: 40 mg via INTRAMUSCULAR

## 2021-12-10 MED ORDER — TIZANIDINE HCL 4 MG PO TABS: 4.0000 mg | ORAL_TABLET | Freq: Three times a day (TID) | ORAL | 2 refills | Status: DC | PRN

## 2021-12-10 MED ORDER — PREGABALIN 50 MG PO CAPS: 50.0000 mg | ORAL_CAPSULE | Freq: Two times a day (BID) | ORAL | 1 refills | Status: DC

## 2021-12-10 MED ORDER — ROPINIROLE HCL 2 MG PO TABS: ORAL_TABLET | ORAL | 2 refills | Status: DC

## 2021-12-10 MED ORDER — OZEMPIC (0.25 OR 0.5 MG/DOSE) 2 MG/3ML ~~LOC~~ SOPN: 0.5000 mg | PEN_INJECTOR | SUBCUTANEOUS | 1 refills | Status: DC

## 2021-12-10 MED ORDER — FINASTERIDE 5 MG PO TABS: 5.0000 mg | ORAL_TABLET | Freq: Every day | ORAL | 3 refills | Status: DC

## 2021-12-10 NOTE — Progress Notes (Signed)
Established patient visit   Patient: Wendy Hubbard   DOB: 05-18-1955   66 y.o. Female  MRN: [id number] Visit Date: 12/10/2021   Chief Complaint  Patient presents with   Follow-up   Subjective    HPI  Follow up diabetes  -started ozempic at last visit.  -HgbA1c 10/23/2021 - 5.7 -consider increasing ozempic to 0.5 mg weekly   Had a fall on 11/29/2021 for which she had to make ED visit.  -injured neck, right shoulder, low back.  -multiple x-rays obtained while in ER.  --Thoracic spine x-ray  ---IMPRESSION: 1. No acute findings. 2. Mild spondylosis of the thoracic spine with mild multilevel disc disease. --CT Cervical spine and head ---IMPRESSION: 1. No acute brain injury. Small scalp contusion over the high right frontal region. 2. No acute cervical spine injury. 3. Mild to moderate spondylosis of the cervical spine with multilevel disc disease and multilevel neural foraminal narrowing as described. --Right Tib/Fib ---IMPRESSION: 1. No acute fracture line is seen. 2. Presumably chronic distal tibiofibular joint space narrowing and possible osseous fusion. Question the sequela of remote trauma. --Left Tib/Fib ---IMPRESSION: No acute findings. --chest ----IMPRESSION: No acute findings.  -was told to take her pain medication up to every 6 hours as needed for pain. She did increase the frequency she was taking her pain medication. She has controlled drug policy signed and on file. She did run out of her medication too soon for new prescription.  -she had been referred to neurology at her last visit due to the severity of the neuropathy she was having in her feet. High dose of hydrocodone/APAP did not seem to be helping. Had been advised that I was unable to prescribe this medication to her on continuous basis and she would have to seek this different avenue for pain control through neurology or perhaps a referral to pain management.    Medications: Outpatient Medications Prior to  Visit  Medication Sig   alosetron (LOTRONEX) 1 MG tablet Take 1 tablet (1 mg total) by mouth 2 (two) times daily. (Patient taking differently: Take 1 mg by mouth 2 (two) times daily.)   ALPRAZolam (XANAX) 0.5 MG tablet TAKE 1/2 TO 1 TABLET BY MOUTH ONCE DAILYAS NEEDED FOR ACUTE ANXIETY   Biotin 10000 MCG TABS Take 10,000 mcg by mouth daily.   cilostazol (PLETAL) 100 MG tablet Take 1 tablet (100 mg total) by mouth 2 (two) times daily.   diphenoxylate-atropine (LOMOTIL) 2.5-0.025 MG tablet Take 1 tablet by mouth 4 (four) times daily as needed for diarrhea or loose stools.   furosemide (LASIX) 20 MG tablet TAKE 1 TABLET BY MOUTH ONCE DAILY   HYDROcodone-acetaminophen (NORCO) 10-325 MG tablet Take 1 tablet by mouth 2 (two) times daily as needed for severe pain.   loratadine (CLARITIN) 10 MG tablet Take 10 mg by mouth daily.   metoprolol succinate (TOPROL-XL) 50 MG 24 hr tablet TAKE 1 and 1/2 TABLETS BY MOUTH AT BEDTIME (Patient taking differently: 1/2 tab)   mirtazapine (REMERON) 15 MG tablet TAKE 1 TABLET BY MOUTH AT BEDTIME FOR SLEEP   Multiple Vitamins-Minerals (BARIATRIC MULTIVITAMINS/IRON PO) Take 1 tablet by mouth daily.   pantoprazole (PROTONIX) 40 MG tablet TAKE 1 TABLET BY MOUTH ONCE DAILY   [DISCONTINUED] DULoxetine (CYMBALTA) 20 MG capsule Take 1 capsule (20 mg total) by mouth daily.   [DISCONTINUED] finasteride (PROSCAR) 5 MG tablet Take 5 mg by mouth daily.   [DISCONTINUED] montelukast (SINGULAIR) 10 MG tablet TAKE 1 TABLET BY MOUTH AT BEDTIME   [  DISCONTINUED] rOPINIRole (REQUIP) 0.5 MG tablet TAKE 1 TABLET BY MOUTH AT BEDTIME MAY INCREASE TO 2 TABS AT BEDTIME AS NEEDED TOLERATED   [DISCONTINUED] rosuvastatin (CRESTOR) 10 MG tablet TAKE 1 TABLET BY MOUTH ONCE DAILY   [DISCONTINUED] Semaglutide,0.25 or 0.'5MG'$ /DOS, (OZEMPIC, 0.25 OR 0.5 MG/DOSE,) 2 MG/3ML SOPN Inject 0.25 mg into the skin once a week.   [DISCONTINUED] tiZANidine (ZANAFLEX) 4 MG tablet TAKE 1 TABLET BY MOUTH TWICE DAILY AS  NEEDED FOR MUSCLE SPASMS   ondansetron (ZOFRAN-ODT) 4 MG disintegrating tablet Take 1 tablet (4 mg total) by mouth every 6 (six) hours as needed for nausea or vomiting. (Patient not taking: Reported on 12/10/2021)   [DISCONTINUED] gabapentin (NEURONTIN) 600 MG tablet Take one AM, one after lunch, and 2 QHS (Patient not taking: Reported on 10/23/2021)   No facility-administered medications prior to visit.    Review of Systems  Constitutional:  Positive for activity change and fatigue. Negative for appetite change, chills and fever.       Unable to participate in normal activities due to pain, especially right shoulder.   HENT:  Negative for congestion, postnasal drip, rhinorrhea, sinus pressure, sinus pain, sneezing and sore throat.   Eyes: Negative.   Respiratory:  Negative for cough, chest tightness, shortness of breath and wheezing.   Cardiovascular:  Negative for chest pain and palpitations.  Gastrointestinal:  Negative for abdominal pain, constipation, diarrhea, nausea and vomiting.  Endocrine: Negative for cold intolerance, heat intolerance, polydipsia and polyuria.       Blood sugars doing well    Genitourinary:  Negative for dyspareunia, dysuria, flank pain, frequency and urgency.  Musculoskeletal:  Positive for arthralgias, back pain, myalgias and neck pain.  Skin:  Negative for rash.  Allergic/Immunologic: Negative for environmental allergies.  Neurological:  Negative for dizziness, weakness and headaches.       Severe neuropathy in both feet.   Hematological:  Negative for adenopathy.  Psychiatric/Behavioral:  Positive for dysphoric mood. The patient is nervous/anxious.        Objective     Today's Vitals   12/10/21 1019  BP: 104/68  Pulse: 78  Temp: 97.7 F (36.5 C)  TempSrc: Temporal  SpO2: 97%  Weight: 195 lb (88.5 kg)  Height: '5\' 4"'$  (1.626 m)   Body mass index is 33.47 kg/m.   BP Readings from Last 3 Encounters:  12/10/21 104/68  11/29/21 (!) 153/76   10/23/21 95/60    Wt Readings from Last 3 Encounters:  12/10/21 195 lb (88.5 kg)  11/29/21 199 lb 11.2 oz (90.6 kg)  10/23/21 207 lb 3.2 oz (94 kg)    Physical Exam Vitals and nursing note reviewed.  Constitutional:      General: She is in acute distress.     Appearance: Normal appearance. She is well-developed. She is obese.  HENT:     Head: Normocephalic and atraumatic.     Mouth/Throat:     Mouth: Mucous membranes are moist.     Pharynx: Oropharynx is clear.  Eyes:     Extraocular Movements: Extraocular movements intact.     Conjunctiva/sclera: Conjunctivae normal.     Pupils: Pupils are equal, round, and reactive to light.  Neck:     Comments: Tenderness and reduced ROM is noted moe on the right side of the neck.  Cardiovascular:     Rate and Rhythm: Normal rate and regular rhythm.     Pulses: Normal pulses.     Heart sounds: Normal heart sounds.  Pulmonary:  Effort: Pulmonary effort is normal.     Breath sounds: Normal breath sounds.  Abdominal:     Palpations: Abdomen is soft.  Musculoskeletal:     Right shoulder: Tenderness and bony tenderness present. Decreased range of motion. Decreased strength.       Arms:     Cervical back: Neck supple. Pain with movement, spinous process tenderness and muscular tenderness present. Decreased range of motion.     Comments: Patient guarding the right shoulder. There is moderate decreased ROM and strength.   Lymphadenopathy:     Cervical: No cervical adenopathy.  Skin:    General: Skin is warm and dry.     Capillary Refill: Capillary refill takes less than 2 seconds.  Neurological:     General: No focal deficit present.     Mental Status: She is alert and oriented to person, place, and time.  Psychiatric:        Attention and Perception: Attention and perception normal.        Mood and Affect: Mood is anxious and depressed.        Speech: Speech normal.        Behavior: Behavior normal. Behavior is cooperative.         Thought Content: Thought content normal.        Cognition and Memory: Cognition and memory normal.        Judgment: Judgment normal.       Assessment & Plan    1. Fall (on) (from) other stairs and steps, subsequent encounter Patient had a fall on 11/29/2021 on the stairs of her porch. She continues to have neck, right shoulder, low back, and knee pain since the fall. Reviewed imaging studies with her which did not show acute abnormalities. Will refer her to orthopedics for further evaluation and treatmet . - Ambulatory referral to Orthopedic Surgery  2. Acute pain of right shoulder Solu-medrol 40 mg injection was given during today's visit. Will have her begin a prednisone taper tomorrow. She should take as directed for 12 days. She should take tylenol and previously prescribed muscle relaxer as needed and as prescribed. Refer to orthopedics for further evaluation and treatment.  - predniSONE (STERAPRED UNI-PAK 48 TAB) 10 MG (48) TBPK tablet; 12 day taper - take by mouth as directed for 12 days  Dispense: 48 tablet; Refill: 0 - Ambulatory referral to Orthopedic Surgery - methylPREDNISolone sodium succinate (SOLU-MEDROL) 40 mg/mL injection 40 mg  3. Acute bilateral low back pain with bilateral sciatica Has been new problem since her fall on 11/29/2021. Take prednisone taper as directed. Rest. Apply heat to effected areas. Refer to orthopedics for further evaluation and treatment  - Ambulatory referral to Orthopedic Surgery  4. Rhomboid muscle pain May take tizanidine 4 mg up to thee times daily as needed for muscle pain and spasms.  - tiZANidine (ZANAFLEX) 4 MG tablet; Take 1 tablet (4 mg total) by mouth every 8 (eight) hours as needed for muscle spasms.  Dispense: 90 tablet; Refill: 2  5. Neck pain Reviewed CT of the neck which was done in ER. She does have multiple level, mild to moderate DDD In the cervical spine. Refer to orthopedics for further evaluation and treatment.  - Ambulatory  referral to Orthopedic Surgery  6. Sensorimotor neuropathy Increase duloxetine to 40 mg daily. Trial f Lyrica 50 mg twice daily. Advised her to keep upcoming appointment with neurology for further evaluation and management of severe neuropathy - DULoxetine 40 MG CPEP; Take 40 mg  by mouth daily.  Dispense: 30 capsule; Refill: 2 - pregabalin (LYRICA) 50 MG capsule; Take 1 capsule (50 mg total) by mouth 2 (two) times daily.  Dispense: 60 capsule; Refill: 1  7. Restless legs syndrome Increased requip to 2 mg at bedtime  - rOPINIRole (REQUIP) 2 MG tablet; Take 1 tablet po QHS  Dispense: 30 tablet; Refill: 2  8. Alopecia Refill proscar 5 mg daily  - finasteride (PROSCAR) 5 MG tablet; Take 1 tablet (5 mg total) by mouth daily.  Dispense: 30 tablet; Refill: 3  9. Uncontrolled type 2 diabetes mellitus with hyperglycemia (HCC) Continue ozempic at current dose. Check HgbA1c at next visit in 3 months.  - Semaglutide,0.25 or 0.'5MG'$ /DOS, (OZEMPIC, 0.25 OR 0.5 MG/DOSE,) 2 MG/3ML SOPN; Inject 0.5 mg into the skin once a week.  Dispense: 3 mL; Refill: 1   Problem List Items Addressed This Visit       Endocrine   Uncontrolled type 2 diabetes mellitus with hyperglycemia (HCC)   Relevant Medications   Semaglutide,0.25 or 0.'5MG'$ /DOS, (OZEMPIC, 0.25 OR 0.5 MG/DOSE,) 2 MG/3ML SOPN     Nervous and Auditory   Sensorimotor neuropathy   Relevant Medications   rOPINIRole (REQUIP) 2 MG tablet   DULoxetine 40 MG CPEP   pregabalin (LYRICA) 50 MG capsule   tiZANidine (ZANAFLEX) 4 MG tablet   Acute bilateral low back pain with bilateral sciatica   Relevant Medications   rOPINIRole (REQUIP) 2 MG tablet   DULoxetine 40 MG CPEP   pregabalin (LYRICA) 50 MG capsule   predniSONE (STERAPRED UNI-PAK 48 TAB) 10 MG (48) TBPK tablet   tiZANidine (ZANAFLEX) 4 MG tablet   Other Relevant Orders   Ambulatory referral to Orthopedic Surgery     Musculoskeletal and Integument   Alopecia   Relevant Medications    finasteride (PROSCAR) 5 MG tablet     Other   Rhomboid muscle pain   Relevant Medications   tiZANidine (ZANAFLEX) 4 MG tablet   Restless legs syndrome   Relevant Medications   rOPINIRole (REQUIP) 2 MG tablet   Fall (on) (from) other stairs and steps, subsequent encounter - Primary   Relevant Orders   Ambulatory referral to Orthopedic Surgery   Acute pain of right shoulder   Relevant Medications   predniSONE (STERAPRED UNI-PAK 48 TAB) 10 MG (48) TBPK tablet   Other Relevant Orders   Ambulatory referral to Orthopedic Surgery   Neck pain   Relevant Orders   Ambulatory referral to Orthopedic Surgery     Return in about 3 months (around 03/12/2022) for diabetes with HgbA1c check, .         Ronnell Freshwater, NP  [facility] at [facility] [phone] (phone) [phone] (fax)  [place]

## 2021-12-15 ENCOUNTER — Encounter: Payer: Self-pay | Admitting: Nurse Practitioner

## 2021-12-15 NOTE — ED Provider Notes (Signed)
[facility] Provider Note   Event Date/Time   First MD Initiated Contact with Patient 11/29/21 1404     (approximate) History  Fall  HPI Wendy Hubbard is a 66 y.o. female who presents via private vehicle from home after mechanical fall down 3 brick steps onto her right side striking her head on a pavement driveway.  Patient now complains of head pain, upper back pain, bilateral knee pain, and chest pain.  Patient states that she is attempted to step down the steps and lost her balance falling onto this right side.  Patient denies any blood thinner use or loss of consciousness ROS: Patient currently denies any vision changes, tinnitus, difficulty speaking, facial droop, sore throat, chest pain, shortness of breath, abdominal pain, nausea/vomiting/diarrhea, dysuria, or weakness/numbness/paresthesias in any extremity   Physical Exam  Triage Vital Signs: ED Triage Vitals  Enc Vitals Group     BP 11/29/21 1033 (!) 175/90     Pulse Rate 11/29/21 1033 80     Resp 11/29/21 1033 20     Temp 11/29/21 1033 97.8 F (36.6 C)     Temp Source 11/29/21 1033 Oral     SpO2 11/29/21 1033 99 %     Weight 11/29/21 1032 199 lb 11.2 oz (90.6 kg)     Height 11/29/21 1032 '5\' 4"'$  (1.626 m)     Head Circumference --      Peak Flow --      Pain Score 11/29/21 1031 10     Pain Loc --      Pain Edu? --      Excl. in [place]? --    Most recent vital signs: Vitals:   11/29/21 1033 11/29/21 1404  BP: (!) 175/90 (!) 153/76  Pulse: 80 73  Resp: 20 18  Temp: 97.8 F (36.6 C) 97.9 F (36.6 C)  SpO2: 99% 100%   General: Awake, oriented x4. CV:  Good peripheral perfusion.  Resp:  Normal effort.  Abd:  No distention.  Other:  Elderly overweight Caucasian female laying in bed in no acute distress with abrasions over bilateral knees and a contusion to the posterior scalp ED Results / Procedures / Treatments  Labs (all labs ordered are listed, but only abnormal results are displayed) Labs  Reviewed - No data to display EKG ED ECG REPORT I, Naaman Plummer, the attending physician, personally viewed and interpreted this ECG. Date: 12/15/2021 EKG Time: 1037 Rate: 73 Rhythm: normal sinus rhythm QRS Axis: normal Intervals: normal ST/T Wave abnormalities: normal Narrative Interpretation: no evidence of acute ischemia RADIOLOGY ED MD interpretation: CT of the head without contrast interpreted by me shows no evidence of acute abnormalities including no intracerebral hemorrhage, obvious masses, or significant edema  CT of the cervical spine interpreted by me does not show any evidence of acute abnormalities including no acute fracture, malalignment, height loss, or dislocation  2 view chest x-ray interpreted by me shows no evidence of acute abnormalities including no pneumonia, pneumothorax, or widened mediastinum  X-rays of the right tibia and fibula, left tibia and fibula, and thoracic spine did not show any evidence of acute fracture or dislocation.  The studies were interpreted by me -Agree with radiology assessment Official radiology report(s): No results found. PROCEDURES: Critical Care performed: No Procedures MEDICATIONS ORDERED IN ED: Medications  oxyCODONE-acetaminophen (PERCOCET/ROXICET) 5-325 MG per tablet 1 tablet (1 tablet Oral Given 11/29/21 1038)  cyclobenzaprine (FLEXERIL) tablet 5 mg (5 mg Oral Given 11/29/21 1444)  IMPRESSION / MDM / ASSESSMENT AND PLAN / ED COURSE  I reviewed the triage vital signs and the nursing notes.                             The patient is on the cardiac monitor to evaluate for evidence of arrhythmia and/or significant heart rate changes. Patient's presentation is most consistent with acute presentation with potential threat to life or bodily function. Presenting after a fall that occurred just prior to arrival, resulting in injury to the head, bilateral knees, low back. The mechanism of injury was a mechanical ground level fall  without syncope or near-syncope. The current level of pain is moderate. There was no loss of consciousness, confusion, seizure, or memory impairment. There is not a laceration associated with the injury. Denies neck pain. The patient does not take blood thinner medications. Denies vomiting, numbness/weakness, fever  Dispo: Discharge with PCP follow-up       FINAL CLINICAL IMPRESSION(S) / ED DIAGNOSES   Final diagnoses:  Fall, initial encounter  Neck pain, acute  Foraminal stenosis of cervical region  Acute pain of both knees  Acute bilateral thoracic back pain  Anterior chest wall pain   Rx / DC Orders   ED Discharge Orders          Ordered    tiZANidine (ZANAFLEX) 4 MG tablet  3 times daily        11/29/21 1507           Note:  This document was prepared using Dragon voice recognition software and may include unintentional dictation errors.   Naaman Plummer, MD 12/15/21 [phone]

## 2021-12-17 ENCOUNTER — Other Ambulatory Visit: Payer: Self-pay | Admitting: Physician Assistant

## 2021-12-17 ENCOUNTER — Other Ambulatory Visit: Payer: Self-pay | Admitting: Nurse Practitioner

## 2021-12-17 DIAGNOSIS — E782 Mixed hyperlipidemia: Secondary | ICD-10-CM

## 2021-12-17 DIAGNOSIS — J301 Allergic rhinitis due to pollen: Secondary | ICD-10-CM

## 2021-12-19 DIAGNOSIS — M5441 Lumbago with sciatica, right side: Secondary | ICD-10-CM | POA: Insufficient documentation

## 2021-12-19 DIAGNOSIS — L659 Nonscarring hair loss, unspecified: Secondary | ICD-10-CM | POA: Insufficient documentation

## 2021-12-19 DIAGNOSIS — M542 Cervicalgia: Secondary | ICD-10-CM | POA: Insufficient documentation

## 2021-12-19 DIAGNOSIS — W108XXD Fall (on) (from) other stairs and steps, subsequent encounter: Secondary | ICD-10-CM | POA: Insufficient documentation

## 2021-12-19 DIAGNOSIS — M25511 Pain in right shoulder: Secondary | ICD-10-CM | POA: Insufficient documentation

## 2021-12-19 DIAGNOSIS — G8929 Other chronic pain: Secondary | ICD-10-CM | POA: Insufficient documentation

## 2022-01-05 ENCOUNTER — Telehealth: Payer: Self-pay | Admitting: Diagnostic Neuroimaging

## 2022-01-05 NOTE — Telephone Encounter (Signed)
LVM and sent mychart msg asking pt to call back to reschedule 9/19 New Patient appointment - MD out

## 2022-01-06 ENCOUNTER — Other Ambulatory Visit: Payer: Self-pay | Admitting: Nurse Practitioner

## 2022-01-06 DIAGNOSIS — M5441 Lumbago with sciatica, right side: Secondary | ICD-10-CM

## 2022-01-06 DIAGNOSIS — M542 Cervicalgia: Secondary | ICD-10-CM

## 2022-01-06 DIAGNOSIS — M7918 Myalgia, other site: Secondary | ICD-10-CM

## 2022-01-06 MED ORDER — METHOCARBAMOL 750 MG PO TABS: 750.0000 mg | ORAL_TABLET | Freq: Three times a day (TID) | ORAL | 1 refills | Status: DC | PRN

## 2022-01-06 NOTE — Progress Notes (Signed)
D/c tizanidine. No longer working. Change to robaxin up to three times daily as needed for muscle pain and spasms. Sent new prescription to [facility]

## 2022-01-09 ENCOUNTER — Other Ambulatory Visit: Payer: Self-pay | Admitting: Nurse Practitioner

## 2022-01-09 DIAGNOSIS — G629 Polyneuropathy, unspecified: Secondary | ICD-10-CM

## 2022-01-12 ENCOUNTER — Encounter: Payer: Self-pay | Admitting: Nurse Practitioner

## 2022-01-13 ENCOUNTER — Other Ambulatory Visit: Payer: Self-pay | Admitting: Nurse Practitioner

## 2022-01-13 DIAGNOSIS — R197 Diarrhea, unspecified: Secondary | ICD-10-CM

## 2022-01-13 MED ORDER — LOPERAMIDE HCL 2 MG PO CAPS: ORAL_CAPSULE | ORAL | 1 refills | Status: DC

## 2022-01-27 ENCOUNTER — Ambulatory Visit: Payer: Medicare Other | Admitting: Diagnostic Neuroimaging

## 2022-01-30 ENCOUNTER — Other Ambulatory Visit: Payer: Self-pay | Admitting: Nurse Practitioner

## 2022-01-30 DIAGNOSIS — F411 Generalized anxiety disorder: Secondary | ICD-10-CM

## 2022-02-01 MED ORDER — ALPRAZOLAM 0.5 MG PO TABS: ORAL_TABLET | ORAL | 1 refills | Status: DC

## 2022-02-02 ENCOUNTER — Other Ambulatory Visit: Payer: Self-pay | Admitting: Nurse Practitioner

## 2022-02-02 MED ORDER — LORATADINE 10 MG PO TABS: 10.0000 mg | ORAL_TABLET | Freq: Every day | ORAL | 3 refills | Status: DC

## 2022-02-02 NOTE — Telephone Encounter (Signed)
Patient is not on lexapro. I hope that part of refill message was old.

## 2022-02-04 ENCOUNTER — Telehealth: Payer: Self-pay

## 2022-02-04 ENCOUNTER — Other Ambulatory Visit: Payer: Self-pay | Admitting: Nurse Practitioner

## 2022-02-04 DIAGNOSIS — G43009 Migraine without aura, not intractable, without status migrainosus: Secondary | ICD-10-CM

## 2022-02-04 DIAGNOSIS — F411 Generalized anxiety disorder: Secondary | ICD-10-CM

## 2022-02-04 MED ORDER — ESCITALOPRAM OXALATE 10 MG PO TABS: 10.0000 mg | ORAL_TABLET | Freq: Every evening | ORAL | 1 refills | Status: DC

## 2022-02-04 MED ORDER — BUTALBITAL-APAP-CAFFEINE 50-325-40 MG PO TABS: 1.0000 | ORAL_TABLET | Freq: Four times a day (QID) | ORAL | 2 refills | Status: DC | PRN

## 2022-02-04 NOTE — Progress Notes (Signed)
Renewed lexapro 10 mg and sent to [facility]

## 2022-02-04 NOTE — Telephone Encounter (Signed)
Please let her know that I Renewed prescription for butalbital with caffeine and sent to [facility].  Thanks  -HB

## 2022-02-04 NOTE — Telephone Encounter (Signed)
Patient called office to see if she could get her butalbital 50/325 caffeine tablets sent to her pharmacy, please advise, thanks!

## 2022-02-04 NOTE — Progress Notes (Signed)
Renewed prescription for butalbital with caffeine and sent to [facility]

## 2022-02-05 NOTE — Telephone Encounter (Signed)
Called pt Wendy Hubbard stating that her Rx was sent to her pharmacy

## 2022-02-06 ENCOUNTER — Encounter: Payer: Self-pay | Admitting: Diagnostic Neuroimaging

## 2022-02-06 ENCOUNTER — Ambulatory Visit: Payer: Medicare Other | Admitting: Diagnostic Neuroimaging

## 2022-02-06 VITALS — BP 145/70 | HR 91 | Ht 64.0 in | Wt 194.0 lb

## 2022-02-06 DIAGNOSIS — S7001XA Contusion of right hip, initial encounter: Secondary | ICD-10-CM | POA: Diagnosis not present

## 2022-02-06 DIAGNOSIS — M5416 Radiculopathy, lumbar region: Secondary | ICD-10-CM | POA: Diagnosis not present

## 2022-02-06 NOTE — Progress Notes (Signed)
[facility]  PATIENT: Wendy Hubbard DOB: [birth year]  REFERRING CLINICIAN: Ronnell Freshwater, NP HISTORY FROM: patient REASON FOR VISIT: new consult   HISTORICAL  CHIEF COMPLAINT:  Chief Complaint  Patient presents with   New Patient (Initial Visit)    Pt in room # 6 and alone.    HISTORY OF PRESENT ILLNESS:   65 year old female with history of diabetes, bariatric surgery, here for evaluation of neuropathy.  Around 2013 patient had onset of numbness and tingling in the toes and feet.  She was diagnosed with diabetes that time.  Hemoglobin A1c was greater than 7 at that time.  Gradually this improved, especially after undergoing bariatric surgery.  Unfortunately numbness and tingling have continued.  This is slightly worsened in last couple of years.  Patient was previously on hydrocodone for pain control by her former PCP, but this was weaned off in the last couple of years.  Now on duloxetine, ropinirole, Lyrica with mild relief.  Tried gabapentin in the past but this did not help.  Also has some chronic low back pain issues.   REVIEW OF SYSTEMS: Full 14 system review of systems performed and negative with exception of: as per HPI.  ALLERGIES: Allergies  Allergen Reactions   Flagyl [Metronidazole] Anaphylaxis and Rash   Penicillins Anaphylaxis   Doxycycline Hives    HOME MEDICATIONS: Outpatient Medications Prior to Visit  Medication Sig Dispense Refill   ALPRAZolam (XANAX) 0.5 MG tablet TAKE 1/2 TO 1 TABLET BY MOUTH ONCE DAILYAS NEEDED FOR ACUTE ANXIETY 30 tablet 1   Biotin 10000 MCG TABS Take 10,000 mcg by mouth daily.     butalbital-acetaminophen-caffeine (FIORICET) 50-325-40 MG tablet Take 1-2 tablets by mouth every 6 (six) hours as needed for headache. 60 tablet 2   cilostazol (PLETAL) 100 MG tablet Take 1 tablet (100 mg total) by mouth 2 (two) times daily. 180 tablet 3   DULoxetine 40 MG CPEP Take 40 mg by mouth daily. 30 capsule 2   escitalopram  (LEXAPRO) 10 MG tablet Take 1 tablet (10 mg total) by mouth every evening. 90 tablet 1   finasteride (PROSCAR) 5 MG tablet Take 1 tablet (5 mg total) by mouth daily. 30 tablet 3   furosemide (LASIX) 20 MG tablet TAKE 1 TABLET BY MOUTH ONCE DAILY 30 tablet 2   HYDROcodone-acetaminophen (NORCO) 10-325 MG tablet Take 1 tablet by mouth 2 (two) times daily as needed for severe pain. 45 tablet 0   loperamide (IMODIUM) 2 MG capsule Take 4 mg po once. Repeat with '2mg'$  po after each loose stool. Max dose is 16 mg/day 45 capsule 1   loratadine (CLARITIN) 10 MG tablet Take 1 tablet (10 mg total) by mouth daily. 90 tablet 3   methocarbamol (ROBAXIN-750) 750 MG tablet Take 1 tablet (750 mg total) by mouth every 8 (eight) hours as needed for muscle spasms. 90 tablet 1   mirtazapine (REMERON) 15 MG tablet TAKE 1 TABLET BY MOUTH AT BEDTIME FOR SLEEP 90 tablet 1   montelukast (SINGULAIR) 10 MG tablet TAKE 1 TABLET BY MOUTH AT BEDTIME 90 tablet 1   Multiple Vitamins-Minerals (BARIATRIC MULTIVITAMINS/IRON PO) Take 1 tablet by mouth daily.     pantoprazole (PROTONIX) 40 MG tablet TAKE 1 TABLET BY MOUTH ONCE DAILY 90 tablet 1   predniSONE (STERAPRED UNI-PAK 48 TAB) 10 MG (48) TBPK tablet 12 day taper - take by mouth as directed for 12 days 48 tablet 0   pregabalin (LYRICA) 50 MG capsule TAKE  1 CAPSULE BY MOUTH TWICE DAILY 60 capsule 2   rOPINIRole (REQUIP) 2 MG tablet Take 1 tablet po QHS 30 tablet 2   rosuvastatin (CRESTOR) 10 MG tablet TAKE 1 TABLET BY MOUTH ONCE DAILY 90 tablet 1   Semaglutide,0.25 or 0.'5MG'$ /DOS, (OZEMPIC, 0.25 OR 0.5 MG/DOSE,) 2 MG/3ML SOPN Inject 0.5 mg into the skin once a week. 3 mL 1   alosetron (LOTRONEX) 1 MG tablet Take 1 tablet (1 mg total) by mouth 2 (two) times daily. (Patient not taking: Reported on 02/06/2022) 60 tablet 5   metoprolol succinate (TOPROL-XL) 50 MG 24 hr tablet TAKE 1 and 1/2 TABLETS BY MOUTH AT BEDTIME (Patient not taking: Reported on 02/06/2022) 135 tablet 0   No  facility-administered medications prior to visit.    PAST MEDICAL HISTORY: Past Medical History:  Diagnosis Date   Allergy    Phreesia 07/23/2020   Anxiety    Phreesia 07/23/2020   COPD (chronic obstructive pulmonary disease) (HCC)    DDD (degenerative disc disease), thoracic    Depression    Phreesia 07/23/2020   Diabetes mellitus without complication (HCC)    type 2   GERD (gastroesophageal reflux disease)    Hyperlipidemia    Neuromuscular disorder (HCC)    severe neuropathy feet   Pneumonia    Rapid heart rate    Sleep apnea    no longer uses CPAP due to weight loss   Wears contact lenses     PAST SURGICAL HISTORY: Past Surgical History:  Procedure Laterality Date   ABDOMINAL HYSTERECTOMY     ANKLE ARTHROSCOPY Bilateral    APPENDECTOMY     BLADDER SUSPENSION     BREAST EXCISIONAL BIOPSY Left 1990   neg   BREAST EXCISIONAL BIOPSY Right 1989   neg   BREAST SURGERY     CHOLECYSTECTOMY     COLONOSCOPY N/A 11/04/2015   Procedure: COLONOSCOPY;  Surgeon: Manya Silvas, MD;  Location: [place];  Service: Endoscopy;  Laterality: N/A;   COLONOSCOPY WITH PROPOFOL N/A 08/30/2020   Procedure: COLONOSCOPY WITH PROPOFOL;  Surgeon: Lucilla Lame, MD;  Location: [place];  Service: Endoscopy;  Laterality: N/A;   ESOPHAGOGASTRODUODENOSCOPY N/A 11/02/2015   Procedure: ESOPHAGOGASTRODUODENOSCOPY (EGD);  Surgeon: Manya Silvas, MD;  Location: [facility] END[facility]COPY;  Service: Endoscopy;  Laterality: N/A;   GASTROJEJUNOSTOMY N/A 08/25/2021   Procedure: LAPAROSCOPIC REDO OF STOMACH TO INTESTINE;  Surgeon: Kieth Brightly, Arta Bruce, MD;  Location: [facility];  Service: General;  Laterality: N/A;   KNEE ARTHROSCOPY Left    PARTIAL GASTRECTOMY N/A 08/25/2021   Procedure: PARTIAL GASTRECTOMY;  Surgeon: Kieth Brightly, Arta Bruce, MD;  Location: [facility];  Service: General;  Laterality: N/A;   POLYPECTOMY  08/30/2020   Procedure: POLYPECTOMY;  Surgeon: Lucilla Lame, MD;  Location: [place];  Service: Endoscopy;;   REDUCTION MAMMAPLASTY Bilateral 1988   TONSILLECTOMY     UPPER GI ENDOSCOPY  08/25/2021   Procedure: UPPER GI ENDOSCOPY;  Surgeon: Kieth Brightly Arta Bruce, MD;  Location: [facility];  Service: General;;    FAMILY HISTORY: Family History  Problem Relation Age of Onset   Hypertension Other    Bladder Cancer Mother    High Cholesterol Mother    High blood pressure Mother    Cancer Mother    Heart attack Father    High blood pressure Father    Kidney cancer Neg Hx    Prostate cancer Neg Hx    Breast cancer Neg Hx     SOCIAL HISTORY:  Social History   Socioeconomic History   Marital status: Married    Spouse name: Not on file   Number of children: Not on file   Years of education: Not on file   Highest education level: Not on file  Occupational History   Not on file  Tobacco Use   Smoking status: Former    Years: 20.00    Types: Cigarettes    Quit date: 1996    Years since quitting: 27.7   Smokeless tobacco: Never   Tobacco comments:    quit in 1996  Vaping Use   Vaping Use: Never used  Substance and Sexual Activity   Alcohol use: Yes    Alcohol/week: 2.0 standard drinks of alcohol    Types: 1 Glasses of wine, 1 Standard drinks or equivalent per week    Comment: occasional  social   Drug use: No   Sexual activity: Yes    Partners: Male    Birth control/protection: Surgical  Other Topics Concern   Not on file  Social History Narrative   Not on file   Social Determinants of Health   Financial Resource Strain: Not on file  Food Insecurity: Not on file  Transportation Needs: Not on file  Physical Activity: Not on file  Stress: Not on file  Social Connections: Not on file  Intimate Partner Violence: Not on file     PHYSICAL EXAM  GENERAL EXAM/CONSTITUTIONAL: Vitals:  Vitals:   02/06/22 1042  BP: (!) 145/70  Pulse: 91  Weight: 194 lb (88 kg)  Height: '5\' 4"'$  (1.626 m)   Body mass index is 33.3 kg/m. Wt Readings from Last 3  Encounters:  02/06/22 194 lb (88 kg)  12/10/21 195 lb (88.5 kg)  11/29/21 199 lb 11.2 oz (90.6 kg)   Patient is in no distress; well developed, nourished and groomed; neck is supple  CARDIOVASCULAR: Examination of carotid arteries is normal; no carotid bruits Regular rate and rhythm, no murmurs Examination of peripheral vascular system by observation and palpation is normal  EYES: Ophthalmoscopic exam of optic discs and posterior segments is normal; no papilledema or hemorrhages No results found.  MUSCULOSKELETAL: Gait, strength, tone, movements noted in Neurologic exam below  NEUROLOGIC: MENTAL STATUS:      No data to display         awake, alert, oriented to person, place and time recent and remote memory intact normal attention and concentration language fluent, comprehension intact, naming intact fund of knowledge appropriate  CRANIAL NERVE:  2nd - no papilledema on fundoscopic exam 2nd, 3rd, 4th, 6th - pupils equal and reactive to light, visual fields full to confrontation, extraocular muscles intact, no nystagmus 5th - facial sensation symmetric 7th - facial strength symmetric 8th - hearing intact 9th - palate elevates symmetrically, uvula midline 11th - shoulder shrug symmetric 12th - tongue protrusion midline  MOTOR:  normal bulk and tone, full strength in the BUE, BLE  SENSORY:  normal and symmetric to light touch, pinprick, temperature, vibration; EXCEPT DECR IN FEET  COORDINATION:  finger-nose-finger, fine finger movements normal  REFLEXES:  deep tendon reflexes TRACE and symmetric  GAIT/STATION:  narrow based gait     DIAGNOSTIC DATA (LABS, IMAGING, TESTING) - I reviewed patient records, labs, notes, testing and imaging myself where available.  Lab Results  Component Value Date   WBC 9.3 08/28/2021   HGB 10.4 (L) 08/28/2021   HCT 32.3 (L) 08/28/2021   MCV 97.6 08/28/2021   PLT 236 08/28/2021  Component Value Date/Time   NA 143  08/28/2021 0425   NA 144 08/07/2020 0938   K 3.4 (L) 08/28/2021 0425   CL 108 08/28/2021 0425   CO2 29 08/28/2021 0425   GLUCOSE 120 (H) 08/28/2021 0425   BUN 10 08/28/2021 0425   BUN 14 08/07/2020 0938   CREATININE 0.80 08/28/2021 0425   CALCIUM 9.1 08/28/2021 0425   PROT 7.1 08/12/2021 1120   PROT 6.6 08/07/2020 0938   ALBUMIN 4.2 08/12/2021 1120   ALBUMIN 4.4 08/07/2020 0938   AST 20 08/12/2021 1120   ALT 18 08/12/2021 1120   ALKPHOS 120 08/12/2021 1120   BILITOT 0.6 08/12/2021 1120   BILITOT 0.3 08/07/2020 0938   GFRNONAA >60 08/28/2021 0425   GFRAA 97 03/07/2020 1031   Lab Results  Component Value Date   CHOL 174 08/07/2020   HDL 57 08/07/2020   LDLCALC 75 08/07/2020   TRIG 259 (H) 08/07/2020   CHOLHDL 3.1 08/07/2020   Lab Results  Component Value Date   HGBA1C 5.7 (A) 10/23/2021   Lab Results  Component Value Date   MLJQGBEE10 071 09/18/2019   Lab Results  Component Value Date   TSH 2.080 08/07/2020    06/14/20 EMG/NCS --> This is an abnormal electrodiagnostic study consistent with a 1) generalized sensory polyneuropathy.    ASSESSMENT AND PLAN  66 y.o. year old female here with numbness, tingling, pain in feet (since ~2013).   Dx:  1. Lumbar radiculopathy       PLAN:  NUMBNESS / PAIN IN FEET --> DIABETIC NEUROPATHY (since ~2013) - continue A1c control - consider capsaicin cream, lidocaine patch / cream, alpha-lipoic acid '600mg'$  daily - check MRI lumbar spine (back pain; radiating to legs; rule out lumbar spinal stenosis, radiculopathies) - continue duloxetine, ropinirole, lyrica - consider pain mgmt clinic referral  Orders Placed This Encounter  Procedures   MR [place]   Return for return to PCP, pending test results.    Penni Bombard, MD 06/29/7586, 32:54 AM Certified in Neurology, Neurophysiology and Neuroimaging  [facility] [address], [place], [place] [address] [phone]

## 2022-02-06 NOTE — Patient Instructions (Signed)
NUMBNESS / PAIN IN FEET --> DIABETIC NEUROPATHY (since ~2013) - continue A1c control - consider capsaicin cream, lidocaine patch / cream, alpha-lipoic acid '600mg'$  daily - check MRI lumbar spine (back pain; radiating to legs) - continue duloxetine, ropinirole, lyrica - consider pain mgmt clinic referral

## 2022-02-10 ENCOUNTER — Telehealth: Payer: Self-pay | Admitting: Diagnostic Neuroimaging

## 2022-02-10 NOTE — Telephone Encounter (Signed)
[employer] medicare order sent to be scheduled at [facility].  No auth req, they will reach out to the patient to schedule.

## 2022-02-16 ENCOUNTER — Encounter: Payer: Medicare Other | Admitting: Skilled Nursing Facility1

## 2022-02-17 ENCOUNTER — Encounter: Payer: Self-pay | Admitting: Nurse Practitioner

## 2022-02-25 ENCOUNTER — Ambulatory Visit
Admission: RE | Admit: 2022-02-25 | Discharge: 2022-02-25 | Disposition: A | Discharge status: Home / Self Care | Payer: Medicare Other | Source: Ambulatory Visit | Attending: Diagnostic Neuroimaging | Admitting: Diagnostic Neuroimaging

## 2022-02-25 DIAGNOSIS — M5416 Radiculopathy, lumbar region: Secondary | ICD-10-CM | POA: Diagnosis not present

## 2022-02-26 ENCOUNTER — Other Ambulatory Visit: Payer: Self-pay | Admitting: Nurse Practitioner

## 2022-02-26 DIAGNOSIS — M4316 Spondylolisthesis, lumbar region: Secondary | ICD-10-CM

## 2022-02-26 DIAGNOSIS — M5136 Other intervertebral disc degeneration, lumbar region: Secondary | ICD-10-CM

## 2022-02-26 DIAGNOSIS — G629 Polyneuropathy, unspecified: Secondary | ICD-10-CM

## 2022-02-26 NOTE — Progress Notes (Signed)
Referral to pain management made today due to severe neuropathy in bilateral feel and legs, chronic back and neck pain due to degenerative disc disease at multiple levels of the spine.

## 2022-03-02 ENCOUNTER — Telehealth: Payer: Self-pay

## 2022-03-02 NOTE — Telephone Encounter (Signed)
Pt is calling stating that she has been off and on vomiting about a week and a half ago.   Sometimes she has to wait extended periods of time to avoid being sick while eating. Pt states she is not over eating so she doesn't think that's the issue.   Another issue  Pt states that she felt a lump on Breast on left side this past weekend and its sore to the touch.  Pt cb [phone]

## 2022-03-04 ENCOUNTER — Encounter: Payer: Self-pay | Admitting: Nurse Practitioner

## 2022-03-04 ENCOUNTER — Ambulatory Visit (INDEPENDENT_AMBULATORY_CARE_PROVIDER_SITE_OTHER): Payer: Medicare Other | Admitting: Nurse Practitioner

## 2022-03-04 ENCOUNTER — Telehealth: Payer: Self-pay

## 2022-03-04 ENCOUNTER — Other Ambulatory Visit: Payer: Self-pay | Admitting: Nurse Practitioner

## 2022-03-04 VITALS — BP 138/78 | HR 76 | Ht 64.0 in | Wt 190.0 lb

## 2022-03-04 DIAGNOSIS — Z23 Encounter for immunization: Secondary | ICD-10-CM | POA: Diagnosis not present

## 2022-03-04 DIAGNOSIS — N63 Unspecified lump in unspecified breast: Secondary | ICD-10-CM

## 2022-03-04 DIAGNOSIS — R112 Nausea with vomiting, unspecified: Secondary | ICD-10-CM | POA: Diagnosis not present

## 2022-03-04 DIAGNOSIS — N6323 Unspecified lump in the left breast, lower outer quadrant: Secondary | ICD-10-CM | POA: Diagnosis not present

## 2022-03-04 DIAGNOSIS — Z1231 Encounter for screening mammogram for malignant neoplasm of breast: Secondary | ICD-10-CM | POA: Diagnosis not present

## 2022-03-04 DIAGNOSIS — M5416 Radiculopathy, lumbar region: Secondary | ICD-10-CM

## 2022-03-04 HISTORY — DX: Nausea with vomiting, unspecified: R11.2

## 2022-03-04 NOTE — Progress Notes (Signed)
Established patient visit   Patient: Wendy Hubbard   DOB: Apr 12, 1956   66 y.o. Female  MRN: [id number] Visit Date: 03/04/2022  Chief Complaint  Patient presents with   Breast Pain   Subjective    HPI  Acute visit  -burning along the outer aspect of the left breast starting a few days ago  -felt lump I nsame area two days ago.  -history of breast calcifications in left breast several years ago. All biopsies were benign   Had abdominal surgery in 08/2021. -waking up every night with significant sweat  -nausea and vomiting - this is related to eating, taking medications, or drinking fluids.  --has been going on for past two weeks. States that she has had these episodes of nausea and vomiting 7 times over past two weeks.  -has really been able to eat a regular meal in past few weeks. She is making sure to eat several, small, high protein meals each day.  -has lost 36 pounds since just prior to her surgery.   Medications: Outpatient Medications Prior to Visit  Medication Sig   ALPRAZolam (XANAX) 0.5 MG tablet TAKE 1/2 TO 1 TABLET BY MOUTH ONCE DAILYAS NEEDED FOR ACUTE ANXIETY   butalbital-acetaminophen-caffeine (FIORICET) 50-325-40 MG tablet Take 1-2 tablets by mouth every 6 (six) hours as needed for headache.   cilostazol (PLETAL) 100 MG tablet Take 1 tablet (100 mg total) by mouth 2 (two) times daily.   DULoxetine 40 MG CPEP Take 40 mg by mouth daily.   escitalopram (LEXAPRO) 10 MG tablet Take 1 tablet (10 mg total) by mouth every evening.   finasteride (PROSCAR) 5 MG tablet Take 1 tablet (5 mg total) by mouth daily.   furosemide (LASIX) 20 MG tablet TAKE 1 TABLET BY MOUTH ONCE DAILY   loperamide (IMODIUM) 2 MG capsule Take 4 mg po once. Repeat with '2mg'$  po after each loose stool. Max dose is 16 mg/day   loratadine (CLARITIN) 10 MG tablet Take 1 tablet (10 mg total) by mouth daily.   methocarbamol (ROBAXIN-750) 750 MG tablet Take 1 tablet (750 mg total) by mouth every 8 (eight) hours as  needed for muscle spasms.   metoprolol succinate (TOPROL-XL) 50 MG 24 hr tablet TAKE 1 and 1/2 TABLETS BY MOUTH AT BEDTIME   mirtazapine (REMERON) 15 MG tablet TAKE 1 TABLET BY MOUTH AT BEDTIME FOR SLEEP   montelukast (SINGULAIR) 10 MG tablet TAKE 1 TABLET BY MOUTH AT BEDTIME   Multiple Vitamins-Minerals (BARIATRIC MULTIVITAMINS/IRON PO) Take 1 tablet by mouth daily.   pantoprazole (PROTONIX) 40 MG tablet TAKE 1 TABLET BY MOUTH ONCE DAILY   pregabalin (LYRICA) 50 MG capsule TAKE 1 CAPSULE BY MOUTH TWICE DAILY   rOPINIRole (REQUIP) 2 MG tablet Take 1 tablet po QHS   rosuvastatin (CRESTOR) 10 MG tablet TAKE 1 TABLET BY MOUTH ONCE DAILY   Semaglutide,0.25 or 0.'5MG'$ /DOS, (OZEMPIC, 0.25 OR 0.5 MG/DOSE,) 2 MG/3ML SOPN Inject 0.5 mg into the skin once a week.   alosetron (LOTRONEX) 1 MG tablet Take 1 tablet (1 mg total) by mouth 2 (two) times daily. (Patient not taking: Reported on 02/06/2022)   [DISCONTINUED] Biotin 10000 MCG TABS Take 10,000 mcg by mouth daily.   [DISCONTINUED] HYDROcodone-acetaminophen (NORCO) 10-325 MG tablet Take 1 tablet by mouth 2 (two) times daily as needed for severe pain.   [DISCONTINUED] predniSONE (STERAPRED UNI-PAK 48 TAB) 10 MG (48) TBPK tablet 12 day taper - take by mouth as directed for 12 days   No facility-administered medications  prior to visit.    Review of Systems  Constitutional:  Negative for activity change, appetite change, chills, fatigue and fever.  HENT:  Negative for congestion, postnasal drip, rhinorrhea, sinus pressure, sinus pain, sneezing and sore throat.   Eyes: Negative.   Respiratory:  Negative for cough, chest tightness, shortness of breath and wheezing.   Cardiovascular:  Negative for chest pain and palpitations.  Gastrointestinal:  Positive for vomiting. Negative for abdominal pain, constipation, diarrhea and nausea.       Vomiting after eating, taking medication.   Endocrine: Negative for cold intolerance, heat intolerance, polydipsia and  polyuria.  Genitourinary:  Negative for dyspareunia, dysuria, flank pain, frequency and urgency.       Eft breast mass noted   Musculoskeletal:  Negative for arthralgias, back pain and myalgias.  Skin:  Negative for rash.  Allergic/Immunologic: Negative for environmental allergies.  Neurological:  Negative for dizziness, weakness and headaches.  Hematological:  Negative for adenopathy.  Psychiatric/Behavioral:  The patient is not nervous/anxious.      Objective     Today's Vitals   03/04/22 0957  BP: 138/78  Pulse: 76  SpO2: 96%  Weight: 190 lb (86.2 kg)  Height: '5\' 4"'$  (1.626 m)   Body mass index is 32.61 kg/m.   Physical Exam Vitals and nursing note reviewed.  Constitutional:      Appearance: Normal appearance. She is well-developed.  HENT:     Head: Normocephalic and atraumatic.     Nose: Nose normal.     Mouth/Throat:     Mouth: Mucous membranes are moist.     Pharynx: Oropharynx is clear.  Eyes:     Extraocular Movements: Extraocular movements intact.     Conjunctiva/sclera: Conjunctivae normal.     Pupils: Pupils are equal, round, and reactive to light.  Cardiovascular:     Rate and Rhythm: Normal rate and regular rhythm.     Pulses: Normal pulses.     Heart sounds: Normal heart sounds.  Pulmonary:     Effort: Pulmonary effort is normal.     Breath sounds: Normal breath sounds.  Chest:  Breasts:    Right: Normal.     Left: Mass present.    Abdominal:     General: Bowel sounds are normal. There is no distension.     Palpations: Abdomen is soft. There is no mass.     Tenderness: There is no abdominal tenderness. There is no right CVA tenderness, left CVA tenderness, guarding or rebound.     Hernia: No hernia is present.  Musculoskeletal:        General: Normal range of motion.     Cervical back: Normal range of motion and neck supple.  Lymphadenopathy:     Cervical: No cervical adenopathy.  Skin:    General: Skin is warm and dry.     Capillary  Refill: Capillary refill takes less than 2 seconds.  Neurological:     General: No focal deficit present.     Mental Status: She is alert and oriented to person, place, and time.  Psychiatric:        Mood and Affect: Mood normal.        Behavior: Behavior normal.        Thought Content: Thought content normal.        Judgment: Judgment normal.       Assessment & Plan     1. Mass of lower outer quadrant of left breast Ill get ultrasound of effected area and  refer to surgery as indicated  - US BREAST LTD UNI LEFT INC AXILLA; Future  2. Encounter for screening mammogram for malignant neoplasm of breast Screening mammogram ordered  - MM DIGITAL SCREENING BILATERAL; Future  3. Nausea and vomiting, unspecified vomiting type This is likely due to previous abdominal surgery. Recommend she discuss new symptoms with bariatric surgeon.    4. Need for influenza Vaccination  Flu vaccine administered during today's visit.   5. Need for Tdap vaccination Tdap vaccine administered during today's visit.     Return for as scheduled.        Ronnell Freshwater, NP  [facility] [phone] (phone) [phone] (fax)  [place]

## 2022-03-04 NOTE — Telephone Encounter (Signed)
Patient called to inform that she has spoken with [facility], and they have sent form over for Heather to sign, pt wasn't aware of if the form was faxed or sent via [place], thanks!

## 2022-03-05 ENCOUNTER — Telehealth: Payer: Self-pay

## 2022-03-05 ENCOUNTER — Other Ambulatory Visit: Payer: Self-pay | Admitting: Nurse Practitioner

## 2022-03-05 DIAGNOSIS — Z1231 Encounter for screening mammogram for malignant neoplasm of breast: Secondary | ICD-10-CM

## 2022-03-05 DIAGNOSIS — N6323 Unspecified lump in the left breast, lower outer quadrant: Secondary | ICD-10-CM

## 2022-03-05 NOTE — Telephone Encounter (Signed)
Will you let me know if we get form from them for me to sign?

## 2022-03-05 NOTE — Progress Notes (Signed)
Sent new orders to [facility] on [address] in [place]. Patient to be notified.

## 2022-03-05 NOTE — Telephone Encounter (Signed)
Pt is calling asking to see if she have change her Dx Mammo to another facility possibly [facility] due to [facility] not having any opening until 11/8.  Pt is currently scheduled for 03/20/22.  Please advise

## 2022-03-06 DIAGNOSIS — Z9884 Bariatric surgery status: Secondary | ICD-10-CM | POA: Diagnosis not present

## 2022-03-06 DIAGNOSIS — R112 Nausea with vomiting, unspecified: Secondary | ICD-10-CM | POA: Diagnosis not present

## 2022-03-06 NOTE — Telephone Encounter (Signed)
I think we can print out the order that was sent STAT to the [place] and fax to the place she requested yesterday

## 2022-03-06 NOTE — Telephone Encounter (Signed)
Called [facility]  scheduled STAT Diagnostic Mammography Date: 03/09/22 Time: 10:00 Please arrive by 9:45   Address: [facility]. [address], [place], [place] [address]   Instructions: No powder, lotion, perfume, or deodorants prior to the scan.    I also called the pt and sent this information via Mychart at the pt request.  Signed order have been faxed/ confirmation received.

## 2022-03-06 NOTE — Telephone Encounter (Signed)
Ok will call

## 2022-03-10 ENCOUNTER — Ambulatory Visit (INDEPENDENT_AMBULATORY_CARE_PROVIDER_SITE_OTHER): Payer: Medicare Other | Admitting: Gastroenterology

## 2022-03-10 ENCOUNTER — Encounter: Payer: Self-pay | Admitting: Gastroenterology

## 2022-03-10 DIAGNOSIS — R112 Nausea with vomiting, unspecified: Secondary | ICD-10-CM | POA: Diagnosis not present

## 2022-03-10 NOTE — Progress Notes (Signed)
Primary Care Physician: Ronnell Freshwater, NP  Primary Gastroenterologist:  Dr. Lucilla Lame  Chief Complaint  Patient presents with   Nausea and Vomiting    HPI: Wendy Hubbard is a 66 y.o. female here with a history of vomiting. The patient reports that most of her vomiting is in the morning.  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  She now reports that she has lost 40 pounds since the surgery.  She states that she also has a feeling that food tastes bad. The patient was seen by her bariatric surgeon for these issues and was recommended to undergo a upper endoscopy for possible reflux and esophagitis.   Past Medical History:  Diagnosis Date   Allergy    Phreesia 07/23/2020   Anxiety    Phreesia 07/23/2020   COPD (chronic obstructive pulmonary disease) (HCC)    DDD (degenerative disc disease), thoracic    Depression    Phreesia 07/23/2020   Diabetes mellitus without complication (HCC)    type 2   GERD (gastroesophageal reflux disease)    Hyperlipidemia    Neuromuscular disorder (HCC)    severe neuropathy feet   Pneumonia    Rapid heart rate    Sleep apnea    no longer uses CPAP due to weight loss   Wears contact lenses     Current Outpatient Medications  Medication Sig Dispense Refill   alosetron (LOTRONEX) 1 MG tablet Take 1 tablet (1 mg total) by mouth 2 (two) times daily. 60 tablet 5   ALPRAZolam (XANAX) 0.5 MG tablet TAKE 1/2 TO 1 TABLET BY MOUTH ONCE DAILYAS NEEDED FOR ACUTE ANXIETY 30 tablet 1   butalbital-acetaminophen-caffeine (FIORICET) 50-325-40 MG tablet Take 1-2 tablets by mouth every 6 (six) hours as needed for headache. 60 tablet 2   cilostazol (PLETAL) 100 MG tablet Take 1 tablet (100 mg total) by mouth 2 (two) times daily. 180 tablet 3   DULoxetine 40 MG CPEP Take 40 mg by mouth daily. 30 capsule 2   escitalopram (LEXAPRO) 10 MG tablet Take 1 tablet (10 mg  total) by mouth every evening. 90 tablet 1   finasteride (PROSCAR) 5 MG tablet Take 1 tablet (5 mg total) by mouth daily. 30 tablet 3   furosemide (LASIX) 20 MG tablet TAKE 1 TABLET BY MOUTH ONCE DAILY 30 tablet 2   loperamide (IMODIUM) 2 MG capsule Take 4 mg po once. Repeat with '2mg'$  po after each loose stool. Max dose is 16 mg/day 45 capsule 1   loratadine (CLARITIN) 10 MG tablet Take 1 tablet (10 mg total) by mouth daily. 90 tablet 3   methocarbamol (ROBAXIN-750) 750 MG tablet Take 1 tablet (750 mg total) by mouth every 8 (eight) hours as needed for muscle spasms. 90 tablet 1   metoprolol succinate (TOPROL-XL) 50 MG 24 hr tablet TAKE 1 and 1/2 TABLETS BY MOUTH AT BEDTIME 135 tablet 0   mirtazapine (REMERON) 15 MG tablet TAKE 1 TABLET BY MOUTH AT BEDTIME FOR SLEEP 90 tablet 1   montelukast (SINGULAIR) 10 MG tablet TAKE 1 TABLET BY MOUTH AT BEDTIME 90 tablet 1   Multiple Vitamins-Minerals (BARIATRIC MULTIVITAMINS/IRON PO) Take 1 tablet by mouth daily.     pantoprazole (PROTONIX) 40 MG tablet TAKE 1 TABLET BY MOUTH ONCE DAILY 90 tablet 1   pregabalin (LYRICA) 50 MG capsule TAKE 1 CAPSULE BY MOUTH TWICE DAILY 60 capsule 2  rOPINIRole (REQUIP) 2 MG tablet Take 1 tablet po QHS 30 tablet 2   rosuvastatin (CRESTOR) 10 MG tablet TAKE 1 TABLET BY MOUTH ONCE DAILY 90 tablet 1   Semaglutide,0.25 or 0.'5MG'$ /DOS, (OZEMPIC, 0.25 OR 0.5 MG/DOSE,) 2 MG/3ML SOPN Inject 0.5 mg into the skin once a week. 3 mL 1   No current facility-administered medications for this visit.    Allergies as of 03/10/2022 - Review Complete 03/10/2022  Allergen Reaction Noted   Flagyl [metronidazole] Anaphylaxis and Rash 08/22/2014   Penicillins Anaphylaxis 08/22/2014   Doxycycline Hives 08/22/2014    ROS:  General: Negative for anorexia, weight loss, fever, chills, fatigue, weakness. ENT: Negative for hoarseness, difficulty swallowing , nasal congestion. CV: Negative for chest pain, angina, palpitations, dyspnea on exertion,  peripheral edema.  Respiratory: Negative for dyspnea at rest, dyspnea on exertion, cough, sputum, wheezing.  GI: See history of present illness. GU:  Negative for dysuria, hematuria, urinary incontinence, urinary frequency, nocturnal urination.  Endo: Negative for unusual weight change.    Physical Examination:   There were no vitals taken for this visit.  General: Well-nourished, well-developed in no acute distress.  Eyes: No icterus. Conjunctivae pink. Lungs: Clear to auscultation bilaterally. Non-labored. Heart: Regular rate and rhythm, no murmurs rubs or gallops.  Abdomen: Bowel sounds are normal, nontender, nondistended, no hepatosplenomegaly or masses, no abdominal bruits or hernia , no rebound or guarding.   Extremities: No lower extremity edema. No clubbing or deformities. Neuro: Alert and oriented x 3.  Grossly intact. Skin: Warm and dry, no jaundice.   Psych: Alert and cooperative, normal mood and affect.  Labs:    Imaging Studies: MR LUMBAR SPINE WO CONTRAST  Result Date: 02/26/2022 Table formatting from the original result was not included. [facility] NEURO[facility] REPORT STUDY DATE: 02/25/22 PATIENT NAME: Wendy Hubbard DOB: 07-Dec-1955 MRN: [id number] ORDERING CLINICIAN: Penni Bombard, MD CLINICAL HISTORY: 66 y.o. year old female with: 1. Lumbar radiculopathy   EXAM: MR LUMBAR SPINE WO CONTRAST TECHNIQUE: MRI of the lumbar spine was obtained utilizing multiplanar, multiecho pulse sequences. CONTRAST: Diagnostic Product Medications (last 72 hours)   None   COMPARISON: 11/10/10 MRI IMAGING SITE: [facility] [place] [id number] FINDINGS: On sagittal views the vertebral bodies have normal height and alignment.  Straightening of normal lumbar lordosis. The conus medullaris terminates at the level of L1.  On axial views: T12-L1: No spinal stenosis or foraminal narrowing L1-2: No spinal stenosis or foraminal  narrowing L2-3: Disc bulging with no spinal stenosis or foraminal narrowing L3-4: Disc bulging with no spinal stenosis or foraminal narrowing L4-5: Disc bulging and facet hypertrophy with no spinal stenosis or foraminal narrowing L5-S1: Facet hypertrophy no spinal stenosis or foraminal narrowing Limited views of the aorta, kidneys, iliopsoas muscles and sacroiliac joints are notable for lumbar paraspinal muscle atrophy.   MRI lumbar spine without contrast demonstrating: -Mild disc bulging with no spinal stenosis or foraminal narrowing INTERPRETING PHYSICIAN: Penni Bombard, MD Certified in Neurology, Neurophysiology and Neuroimaging [facility] [address], [place], [place] [address] [phone]   Assessment and Plan:   Wendy Hubbard is a 66 y.o. y/o female who comes in today with a history of vomiting mostly in the morning but can happen other times during the day. The patient was seen by her bariatric surgeon who recommended the patient undergo an upper endoscopy by me to rule out esophagitis.  The patient will be set  up for an EGD.  The patient has been explained the plan and agrees with it.     Lucilla Lame, MD. [facility]    Note: This dictation was prepared with Dragon dictation along with smaller phrase technology. Any transcriptional errors that result from this process are unintentional.

## 2022-03-10 NOTE — Patient Instructions (Signed)
Patient will continue to hold Ozempic, last dose was 2 weeks ago

## 2022-03-10 NOTE — H&P (View-Only) (Signed)
Primary Care Physician: Ronnell Freshwater, NP  Primary Gastroenterologist:  Dr. Lucilla Lame  Chief Complaint  Patient presents with   Nausea and Vomiting    HPI: Wendy Hubbard is a 66 y.o. female here with a history of vomiting. The patient reports that most of her vomiting is in the morning.  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  She now reports that she has lost 40 pounds since the surgery.  She states that she also has a feeling that food tastes bad. The patient was seen by her bariatric surgeon for these issues and was recommended to undergo a upper endoscopy for possible reflux and esophagitis.   Past Medical History:  Diagnosis Date   Allergy    Phreesia 07/23/2020   Anxiety    Phreesia 07/23/2020   COPD (chronic obstructive pulmonary disease) (HCC)    DDD (degenerative disc disease), thoracic    Depression    Phreesia 07/23/2020   Diabetes mellitus without complication (HCC)    type 2   GERD (gastroesophageal reflux disease)    Hyperlipidemia    Neuromuscular disorder (HCC)    severe neuropathy feet   Pneumonia    Rapid heart rate    Sleep apnea    no longer uses CPAP due to weight loss   Wears contact lenses     Current Outpatient Medications  Medication Sig Dispense Refill   alosetron (LOTRONEX) 1 MG tablet Take 1 tablet (1 mg total) by mouth 2 (two) times daily. 60 tablet 5   ALPRAZolam (XANAX) 0.5 MG tablet TAKE 1/2 TO 1 TABLET BY MOUTH ONCE DAILYAS NEEDED FOR ACUTE ANXIETY 30 tablet 1   butalbital-acetaminophen-caffeine (FIORICET) 50-325-40 MG tablet Take 1-2 tablets by mouth every 6 (six) hours as needed for headache. 60 tablet 2   cilostazol (PLETAL) 100 MG tablet Take 1 tablet (100 mg total) by mouth 2 (two) times daily. 180 tablet 3   DULoxetine 40 MG CPEP Take 40 mg by mouth daily. 30 capsule 2   escitalopram (LEXAPRO) 10 MG tablet Take 1 tablet (10 mg  total) by mouth every evening. 90 tablet 1   finasteride (PROSCAR) 5 MG tablet Take 1 tablet (5 mg total) by mouth daily. 30 tablet 3   furosemide (LASIX) 20 MG tablet TAKE 1 TABLET BY MOUTH ONCE DAILY 30 tablet 2   loperamide (IMODIUM) 2 MG capsule Take 4 mg po once. Repeat with '2mg'$  po after each loose stool. Max dose is 16 mg/day 45 capsule 1   loratadine (CLARITIN) 10 MG tablet Take 1 tablet (10 mg total) by mouth daily. 90 tablet 3   methocarbamol (ROBAXIN-750) 750 MG tablet Take 1 tablet (750 mg total) by mouth every 8 (eight) hours as needed for muscle spasms. 90 tablet 1   metoprolol succinate (TOPROL-XL) 50 MG 24 hr tablet TAKE 1 and 1/2 TABLETS BY MOUTH AT BEDTIME 135 tablet 0   mirtazapine (REMERON) 15 MG tablet TAKE 1 TABLET BY MOUTH AT BEDTIME FOR SLEEP 90 tablet 1   montelukast (SINGULAIR) 10 MG tablet TAKE 1 TABLET BY MOUTH AT BEDTIME 90 tablet 1   Multiple Vitamins-Minerals (BARIATRIC MULTIVITAMINS/IRON PO) Take 1 tablet by mouth daily.     pantoprazole (PROTONIX) 40 MG tablet TAKE 1 TABLET BY MOUTH ONCE DAILY 90 tablet 1   pregabalin (LYRICA) 50 MG capsule TAKE 1 CAPSULE BY MOUTH TWICE DAILY 60 capsule 2  rOPINIRole (REQUIP) 2 MG tablet Take 1 tablet po QHS 30 tablet 2   rosuvastatin (CRESTOR) 10 MG tablet TAKE 1 TABLET BY MOUTH ONCE DAILY 90 tablet 1   Semaglutide,0.25 or 0.'5MG'$ /DOS, (OZEMPIC, 0.25 OR 0.5 MG/DOSE,) 2 MG/3ML SOPN Inject 0.5 mg into the skin once a week. 3 mL 1   No current facility-administered medications for this visit.    Allergies as of 03/10/2022 - Review Complete 03/10/2022  Allergen Reaction Noted   Flagyl [metronidazole] Anaphylaxis and Rash 08/22/2014   Penicillins Anaphylaxis 08/22/2014   Doxycycline Hives 08/22/2014    ROS:  General: Negative for anorexia, weight loss, fever, chills, fatigue, weakness. ENT: Negative for hoarseness, difficulty swallowing , nasal congestion. CV: Negative for chest pain, angina, palpitations, dyspnea on exertion,  peripheral edema.  Respiratory: Negative for dyspnea at rest, dyspnea on exertion, cough, sputum, wheezing.  GI: See history of present illness. GU:  Negative for dysuria, hematuria, urinary incontinence, urinary frequency, nocturnal urination.  Endo: Negative for unusual weight change.    Physical Examination:   There were no vitals taken for this visit.  General: Well-nourished, well-developed in no acute distress.  Eyes: No icterus. Conjunctivae pink. Lungs: Clear to auscultation bilaterally. Non-labored. Heart: Regular rate and rhythm, no murmurs rubs or gallops.  Abdomen: Bowel sounds are normal, nontender, nondistended, no hepatosplenomegaly or masses, no abdominal bruits or hernia , no rebound or guarding.   Extremities: No lower extremity edema. No clubbing or deformities. Neuro: Alert and oriented x 3.  Grossly intact. Skin: Warm and dry, no jaundice.   Psych: Alert and cooperative, normal mood and affect.  Labs:    Imaging Studies: MR LUMBAR SPINE WO CONTRAST  Result Date: 02/26/2022 Table formatting from the original result was not included. [facility] NEURO[facility] REPORT STUDY DATE: 02/25/22 PATIENT NAME: Wendy Hubbard DOB: [birth year] MRN: [id number] ORDERING CLINICIAN: Penni Bombard, MD CLINICAL HISTORY: 66 y.o. year old female with: 1. Lumbar radiculopathy   EXAM: MR LUMBAR SPINE WO CONTRAST TECHNIQUE: MRI of the lumbar spine was obtained utilizing multiplanar, multiecho pulse sequences. CONTRAST: Diagnostic Product Medications (last 72 hours)   None   COMPARISON: 11/10/10 MRI IMAGING SITE: [facility] [place] [id number] FINDINGS: On sagittal views the vertebral bodies have normal height and alignment.  Straightening of normal lumbar lordosis. The conus medullaris terminates at the level of L1.  On axial views: T12-L1: No spinal stenosis or foraminal narrowing L1-2: No spinal stenosis or foraminal  narrowing L2-3: Disc bulging with no spinal stenosis or foraminal narrowing L3-4: Disc bulging with no spinal stenosis or foraminal narrowing L4-5: Disc bulging and facet hypertrophy with no spinal stenosis or foraminal narrowing L5-S1: Facet hypertrophy no spinal stenosis or foraminal narrowing Limited views of the aorta, kidneys, iliopsoas muscles and sacroiliac joints are notable for lumbar paraspinal muscle atrophy.   MRI lumbar spine without contrast demonstrating: -Mild disc bulging with no spinal stenosis or foraminal narrowing INTERPRETING PHYSICIAN: Penni Bombard, MD Certified in Neurology, Neurophysiology and Neuroimaging [facility] [address], [place], [place] [address] [phone]   Assessment and Plan:   LINDZEY ZENT is a 66 y.o. y/o female who comes in today with a history of vomiting mostly in the morning but can happen other times during the day. The patient was seen by her bariatric surgeon who recommended the patient undergo an upper endoscopy by me to rule out esophagitis.  The patient will be set  up for an EGD.  The patient has been explained the plan and agrees with it.     Lucilla Lame, MD. [facility]    Note: This dictation was prepared with Dragon dictation along with smaller phrase technology. Any transcriptional errors that result from this process are unintentional.

## 2022-03-11 ENCOUNTER — Telehealth: Payer: Self-pay | Admitting: Diagnostic Neuroimaging

## 2022-03-11 ENCOUNTER — Encounter: Payer: Self-pay | Admitting: Gastroenterology

## 2022-03-11 NOTE — Telephone Encounter (Signed)
Referral sent to [facility], phone # [phone]. 

## 2022-03-12 ENCOUNTER — Encounter: Payer: Self-pay | Admitting: Gastroenterology

## 2022-03-12 ENCOUNTER — Other Ambulatory Visit
Admission: RE | Admit: 2022-03-12 | Discharge: 2022-03-12 | Disposition: A | Discharge status: Home / Self Care | Payer: Medicare Other | Attending: Urgent Care | Admitting: Urgent Care

## 2022-03-12 DIAGNOSIS — E119 Type 2 diabetes mellitus without complications: Secondary | ICD-10-CM | POA: Diagnosis not present

## 2022-03-12 DIAGNOSIS — K219 Gastro-esophageal reflux disease without esophagitis: Secondary | ICD-10-CM | POA: Diagnosis not present

## 2022-03-12 DIAGNOSIS — T182XXA Foreign body in stomach, initial encounter: Secondary | ICD-10-CM | POA: Diagnosis not present

## 2022-03-12 DIAGNOSIS — Z9884 Bariatric surgery status: Secondary | ICD-10-CM | POA: Diagnosis not present

## 2022-03-12 DIAGNOSIS — I10 Essential (primary) hypertension: Secondary | ICD-10-CM | POA: Diagnosis not present

## 2022-03-12 DIAGNOSIS — Z7985 Long-term (current) use of injectable non-insulin antidiabetic drugs: Secondary | ICD-10-CM | POA: Diagnosis not present

## 2022-03-12 DIAGNOSIS — Z6832 Body mass index (BMI) 32.0-32.9, adult: Secondary | ICD-10-CM | POA: Diagnosis not present

## 2022-03-12 DIAGNOSIS — X58XXXA Exposure to other specified factors, initial encounter: Secondary | ICD-10-CM | POA: Diagnosis not present

## 2022-03-12 DIAGNOSIS — G4730 Sleep apnea, unspecified: Secondary | ICD-10-CM | POA: Diagnosis not present

## 2022-03-12 DIAGNOSIS — J449 Chronic obstructive pulmonary disease, unspecified: Secondary | ICD-10-CM | POA: Diagnosis not present

## 2022-03-12 DIAGNOSIS — R112 Nausea with vomiting, unspecified: Secondary | ICD-10-CM | POA: Diagnosis not present

## 2022-03-12 DIAGNOSIS — Z87891 Personal history of nicotine dependence: Secondary | ICD-10-CM | POA: Diagnosis not present

## 2022-03-12 LAB — POTASSIUM: Potassium: 4.4 mmol/L (ref 3.5–5.1)

## 2022-03-13 ENCOUNTER — Telehealth: Payer: Self-pay

## 2022-03-13 DIAGNOSIS — Z01818 Encounter for other preprocedural examination: Secondary | ICD-10-CM

## 2022-03-13 DIAGNOSIS — Z01812 Encounter for preprocedural laboratory examination: Secondary | ICD-10-CM

## 2022-03-13 DIAGNOSIS — Z79899 Other long term (current) drug therapy: Secondary | ICD-10-CM

## 2022-03-13 NOTE — Telephone Encounter (Signed)
Medical clearance is being requested for pt to have a EGD on March 16, 2022.   Clearance form is on provider desk for her medical advice upon arrival Monday morning.  Please advise

## 2022-03-16 ENCOUNTER — Other Ambulatory Visit: Payer: Self-pay

## 2022-03-16 ENCOUNTER — Ambulatory Visit: Payer: Medicare Other | Admitting: Anesthesiology

## 2022-03-16 ENCOUNTER — Ambulatory Visit
Admission: RE | Disposition: A | Discharge status: Home / Self Care | Payer: Self-pay | Source: Home / Self Care | Attending: Gastroenterology

## 2022-03-16 ENCOUNTER — Encounter: Payer: Self-pay | Admitting: Gastroenterology

## 2022-03-16 ENCOUNTER — Ambulatory Visit
Admission: RE | Admit: 2022-03-16 | Discharge: 2022-03-16 | Disposition: A | Discharge status: Home / Self Care | Payer: Medicare Other | Attending: Gastroenterology | Admitting: Gastroenterology

## 2022-03-16 DIAGNOSIS — Z79899 Other long term (current) drug therapy: Secondary | ICD-10-CM

## 2022-03-16 DIAGNOSIS — Z01812 Encounter for preprocedural laboratory examination: Secondary | ICD-10-CM

## 2022-03-16 DIAGNOSIS — Z9884 Bariatric surgery status: Secondary | ICD-10-CM | POA: Diagnosis not present

## 2022-03-16 DIAGNOSIS — E119 Type 2 diabetes mellitus without complications: Secondary | ICD-10-CM | POA: Insufficient documentation

## 2022-03-16 DIAGNOSIS — T182XXA Foreign body in stomach, initial encounter: Secondary | ICD-10-CM | POA: Insufficient documentation

## 2022-03-16 DIAGNOSIS — J449 Chronic obstructive pulmonary disease, unspecified: Secondary | ICD-10-CM | POA: Diagnosis not present

## 2022-03-16 DIAGNOSIS — G4730 Sleep apnea, unspecified: Secondary | ICD-10-CM | POA: Diagnosis not present

## 2022-03-16 DIAGNOSIS — K219 Gastro-esophageal reflux disease without esophagitis: Secondary | ICD-10-CM | POA: Insufficient documentation

## 2022-03-16 DIAGNOSIS — Z6832 Body mass index (BMI) 32.0-32.9, adult: Secondary | ICD-10-CM | POA: Insufficient documentation

## 2022-03-16 DIAGNOSIS — Z7985 Long-term (current) use of injectable non-insulin antidiabetic drugs: Secondary | ICD-10-CM | POA: Diagnosis not present

## 2022-03-16 DIAGNOSIS — I10 Essential (primary) hypertension: Secondary | ICD-10-CM | POA: Insufficient documentation

## 2022-03-16 DIAGNOSIS — R112 Nausea with vomiting, unspecified: Secondary | ICD-10-CM | POA: Insufficient documentation

## 2022-03-16 DIAGNOSIS — Z01818 Encounter for other preprocedural examination: Secondary | ICD-10-CM

## 2022-03-16 DIAGNOSIS — Z87891 Personal history of nicotine dependence: Secondary | ICD-10-CM | POA: Insufficient documentation

## 2022-03-16 DIAGNOSIS — T7840XA Allergy, unspecified, initial encounter: Secondary | ICD-10-CM | POA: Diagnosis not present

## 2022-03-16 DIAGNOSIS — X58XXXA Exposure to other specified factors, initial encounter: Secondary | ICD-10-CM | POA: Insufficient documentation

## 2022-03-16 DIAGNOSIS — T189XXA Foreign body of alimentary tract, part unspecified, initial encounter: Secondary | ICD-10-CM | POA: Diagnosis not present

## 2022-03-16 HISTORY — PX: ESOPHAGOGASTRODUODENOSCOPY (EGD) WITH PROPOFOL: SHX5813

## 2022-03-16 SURGERY — ESOPHAGOGASTRODUODENOSCOPY (EGD) WITH PROPOFOL
Anesthesia: General

## 2022-03-16 MED ORDER — LACTATED RINGERS IV SOLN: INTRAVENOUS | Status: DC

## 2022-03-16 MED ORDER — PROPOFOL 10 MG/ML IV BOLUS
INTRAVENOUS | Status: DC | PRN
  Administered 2022-03-16: 90 mg via INTRAVENOUS
  Administered 2022-03-16: 50 mg via INTRAVENOUS

## 2022-03-16 MED ORDER — LIDOCAINE HCL (CARDIAC) PF 100 MG/5ML IV SOSY
PREFILLED_SYRINGE | INTRAVENOUS | Status: DC | PRN
  Administered 2022-03-16: 80 mg via INTRAVENOUS

## 2022-03-16 MED ORDER — STERILE WATER FOR IRRIGATION IR SOLN
Status: DC | PRN
  Administered 2022-03-16: 1500 mL

## 2022-03-16 MED ORDER — SODIUM CHLORIDE 0.9 % IV SOLN: INTRAVENOUS | Status: DC

## 2022-03-16 SURGICAL SUPPLY — 32 items

## 2022-03-16 NOTE — Anesthesia Postprocedure Evaluation (Signed)
Anesthesia Post Note  Patient: Wendy Hubbard  Procedure(s) Performed: ESOPHAGOGASTRODUODENOSCOPY (EGD) WITH PROPOFOL foregin body removal  Patient location during evaluation: PACU Anesthesia Type: General Level of consciousness: awake and alert Pain management: pain level controlled Vital Signs Assessment: post-procedure vital signs reviewed and stable Respiratory status: spontaneous breathing, nonlabored ventilation, respiratory function stable and patient connected to nasal cannula oxygen Cardiovascular status: blood pressure returned to baseline and stable Postop Assessment: no apparent nausea or vomiting Anesthetic complications: no  No notable events documented.   Last Vitals:  Vitals:   03/16/22 0905 03/16/22 0916  BP: (!) 150/72 136/64  Pulse: 74 73  Resp: 16 15  Temp: 36.7 C 36.7 C  SpO2: 93% 97%    Last Pain:  Vitals:   03/16/22 0916  TempSrc:   PainSc: 0-No pain                 Dimas Millin

## 2022-03-16 NOTE — Telephone Encounter (Signed)
I singed this morning and was faxed to GI

## 2022-03-16 NOTE — Interval H&P Note (Signed)
Lucilla Lame, MD [facility] [address] [place], [place] [address] Phone:[phone] Fax : [phone]  Primary Care Physician:  Ronnell Freshwater, NP Primary Gastroenterologist:  Dr. Allen Norris  Pre-Procedure History & Physical: HPI:  Wendy Hubbard is a 66 y.o. female is here for an endoscopy.   Past Medical History:  Diagnosis Date   Allergy    Phreesia 07/23/2020   Anxiety    Phreesia 07/23/2020   COPD (chronic obstructive pulmonary disease) (HCC)    DDD (degenerative disc disease), thoracic    Depression    Phreesia 07/23/2020   Diabetes mellitus without complication (HCC)    type 2   GERD (gastroesophageal reflux disease)    Hyperlipidemia    Neuromuscular disorder (HCC)    severe neuropathy feet   Pneumonia    Rapid heart rate    Sleep apnea    no longer uses CPAP due to weight loss   Wears contact lenses     Past Surgical History:  Procedure Laterality Date   ABDOMINAL HYSTERECTOMY     ANKLE ARTHROSCOPY Bilateral    APPENDECTOMY     BLADDER SUSPENSION     BREAST EXCISIONAL BIOPSY Left 1990   neg   BREAST EXCISIONAL BIOPSY Right 1989   neg   BREAST SURGERY     CHOLECYSTECTOMY     COLONOSCOPY N/A 11/04/2015   Procedure: COLONOSCOPY;  Surgeon: Manya Silvas, MD;  Location: [place];  Service: Endoscopy;  Laterality: N/A;   COLONOSCOPY WITH PROPOFOL N/A 08/30/2020   Procedure: COLONOSCOPY WITH PROPOFOL;  Surgeon: Lucilla Lame, MD;  Location: [place];  Service: Endoscopy;  Laterality: N/A;   ESOPHAGOGASTRODUODENOSCOPY N/A 11/02/2015   Procedure: ESOPHAGOGASTRODUODENOSCOPY (EGD);  Surgeon: Manya Silvas, MD;  Location: [facility];  Service: Endoscopy;  Laterality: N/A;   GASTROJEJUNOSTOMY N/A 08/25/2021   Procedure: LAPAROSCOPIC REDO OF STOMACH TO INTESTINE;  Surgeon: Kieth Brightly, Arta Bruce, MD;  Location: [facility];  Service: General;  Laterality: N/A;   KNEE ARTHROSCOPY Left    PARTIAL GASTRECTOMY N/A 08/25/2021   Procedure: PARTIAL  GASTRECTOMY;  Surgeon: Kieth Brightly Arta Bruce, MD;  Location: [facility];  Service: General;  Laterality: N/A;   POLYPECTOMY  08/30/2020   Procedure: POLYPECTOMY;  Surgeon: Lucilla Lame, MD;  Location: [place];  Service: Endoscopy;;   REDUCTION MAMMAPLASTY Bilateral 1988   TONSILLECTOMY     UPPER GI ENDOSCOPY  08/25/2021   Procedure: UPPER GI ENDOSCOPY;  Surgeon: Mickeal Skinner, MD;  Location: [facility];  Service: General;;    Prior to Admission medications   Medication Sig Start Date End Date Taking? Authorizing Provider  alosetron (LOTRONEX) 1 MG tablet Take 1 tablet (1 mg total) by mouth 2 (two) times daily. 12/26/20  Yes Lucilla Lame, MD  ALPRAZolam Duanne Moron) 0.5 MG tablet TAKE 1/2 TO 1 TABLET BY MOUTH ONCE DAILYAS NEEDED FOR ACUTE ANXIETY 02/01/22  Yes Ronnell Freshwater, NP  butalbital-acetaminophen-caffeine (FIORICET) 50-325-40 MG tablet Take 1-2 tablets by mouth every 6 (six) hours as needed for headache. 02/04/22 02/04/23 Yes Boscia, Greer Ee, NP  cilostazol (PLETAL) 100 MG tablet Take 1 tablet (100 mg total) by mouth 2 (two) times daily. 10/31/21  Yes Boscia, Greer Ee, NP  DULoxetine 40 MG CPEP Take 40 mg by mouth daily. 12/10/21  Yes Boscia, Greer Ee, NP  escitalopram (LEXAPRO) 10 MG tablet Take 1 tablet (10 mg total) by mouth every evening. 02/04/22  Yes Boscia, Heather E, NP  finasteride (PROSCAR) 5 MG tablet Take 1  tablet (5 mg total) by mouth daily. 12/10/21  Yes Boscia, Heather E, NP  furosemide (LASIX) 20 MG tablet TAKE 1 TABLET BY MOUTH ONCE DAILY 01/09/22  Yes Boscia, Heather E, NP  methocarbamol (ROBAXIN-750) 750 MG tablet Take 1 tablet (750 mg total) by mouth every 8 (eight) hours as needed for muscle spasms. 01/06/22  Yes Boscia, Greer Ee, NP  metoprolol succinate (TOPROL-XL) 50 MG 24 hr tablet TAKE 1 and 1/2 TABLETS BY MOUTH AT BEDTIME Patient taking differently: 25 mg daily. 03/11/22  Pt currently taking 1/2 tab each evening 09/29/21  Yes Boscia, Heather E, NP  mirtazapine  (REMERON) 15 MG tablet TAKE 1 TABLET BY MOUTH AT BEDTIME FOR SLEEP 09/15/21  Yes Boscia, Heather E, NP  montelukast (SINGULAIR) 10 MG tablet TAKE 1 TABLET BY MOUTH AT BEDTIME 12/17/21  Yes Boscia, Heather E, NP  Multiple Vitamins-Minerals (BARIATRIC MULTIVITAMINS/IRON PO) Take 1 tablet by mouth daily.   Yes [provider]  pantoprazole (PROTONIX) 40 MG tablet TAKE 1 TABLET BY MOUTH ONCE DAILY 12/01/21  Yes Boscia, Heather E, NP  pregabalin (LYRICA) 50 MG capsule TAKE 1 CAPSULE BY MOUTH TWICE DAILY 01/09/22  Yes Boscia, Heather E, NP  rOPINIRole (REQUIP) 2 MG tablet Take 1 tablet po QHS 12/10/21  Yes Boscia, Heather E, NP  rosuvastatin (CRESTOR) 10 MG tablet TAKE 1 TABLET BY MOUTH ONCE DAILY 12/17/21  Yes Boscia, Heather E, NP  loperamide (IMODIUM) 2 MG capsule Take 4 mg po once. Repeat with '2mg'$  po after each loose stool. Max dose is 16 mg/day Patient not taking: Reported on 03/11/2022 01/13/22   Ronnell Freshwater, NP  loratadine (CLARITIN) 10 MG tablet Take 1 tablet (10 mg total) by mouth daily. Patient not taking: Reported on 03/11/2022 02/02/22   Ronnell Freshwater, NP  Semaglutide,0.25 or 0.'5MG'$ /DOS, (OZEMPIC, 0.25 OR 0.5 MG/DOSE,) 2 MG/3ML SOPN Inject 0.5 mg into the skin once a week. Patient taking differently: Inject 0.5 mg into the skin once a week. Last dose 02/23/22 12/10/21   Ronnell Freshwater, NP    Allergies as of 03/10/2022 - Review Complete 03/10/2022  Allergen Reaction Noted   Flagyl [metronidazole] Anaphylaxis and Rash 08/22/2014   Penicillins Anaphylaxis 08/22/2014   Doxycycline Hives 08/22/2014    Family History  Problem Relation Age of Onset   Hypertension Other    Bladder Cancer Mother    High Cholesterol Mother    High blood pressure Mother    Cancer Mother    Heart attack Father    High blood pressure Father    Kidney cancer Neg Hx    Prostate cancer Neg Hx    Breast cancer Neg Hx     Social History   Socioeconomic History   Marital status: Married    Spouse name:  Not on file   Number of children: Not on file   Years of education: Not on file   Highest education level: Not on file  Occupational History   Not on file  Tobacco Use   Smoking status: Former    Years: 20.00    Types: Cigarettes    Quit date: 1996    Years since quitting: 27.8   Smokeless tobacco: Never   Tobacco comments:    quit in 1996  Vaping Use   Vaping Use: Never used  Substance and Sexual Activity   Alcohol use: Yes    Alcohol/week: 2.0 standard drinks of alcohol    Types: 1 Glasses of wine, 1 Standard drinks or equivalent  per week    Comment: occasional  social   Drug use: No   Sexual activity: Yes    Partners: Male    Birth control/protection: Surgical, Other-see comments  Other Topics Concern   Not on file  Social History Narrative   Not on file   Social Determinants of Health   Financial Resource Strain: Not on file  Food Insecurity: Not on file  Transportation Needs: Not on file  Physical Activity: Not on file  Stress: Not on file  Social Connections: Not on file  Intimate Partner Violence: Not on file    Review of Systems: See HPI, otherwise negative ROS  Physical Exam: Ht '5\' 4"'$  (1.626 m)   Wt 86.2 kg   BMI 32.61 kg/m  General:   Alert,  pleasant and cooperative in NAD Head:  Normocephalic and atraumatic. Neck:  Supple; no masses or thyromegaly. Lungs:  Clear throughout to auscultation.    Heart:  Regular rate and rhythm. Abdomen:  Soft, nontender and nondistended. Normal bowel sounds, without guarding, and without rebound.   Neurologic:  Alert and  oriented x4;  grossly normal neurologically.  Impression/Plan: Samentha Perham Shingledecker is here for an endoscopy to be performed for nausea and vomiting  Risks, benefits, limitations, and alternatives regarding  endoscopy have been reviewed with the patient.  Questions have been answered.  All parties agreeable.   Lucilla Lame, MD  03/16/2022, 8:21 AM

## 2022-03-16 NOTE — Op Note (Signed)
[facility] Patient Name: Wendy Hubbard Procedure Date: 03/16/2022 8:48 AM MRN: [id number] Account #: [id number] Date of Birth: July 20, 1955 Admit Type: Outpatient Age: 66 Room: [facility] OR ROOM 01 Gender: Female Note Status: Finalized Instrument Name: [id number] Procedure:             Upper GI endoscopy Indications:           Nausea with vomiting Providers:             Lucilla Lame MD, MD Medicines:             Propofol per Anesthesia Complications:         No immediate complications. Procedure:             Pre-Anesthesia Assessment:                        - Prior to the procedure, a History and Physical was                         performed, and patient medications and allergies were                         reviewed. The patient's tolerance of previous                         anesthesia was also reviewed. The risks and benefits                         of the procedure and the sedation options and risks                         were discussed with the patient. All questions were                         answered, and informed consent was obtained. Prior                         Anticoagulants: The patient has taken no anticoagulant                         or antiplatelet agents. ASA Grade Assessment: II - A                         patient with mild systemic disease. After reviewing                         the risks and benefits, the patient was deemed in                         satisfactory condition to undergo the procedure.                        After obtaining informed consent, the endoscope was                         passed under direct vision. Throughout the procedure,                         the patient's blood pressure,  pulse, and oxygen                         saturations were monitored continuously. The Endoscope                         was introduced through the mouth, and advanced to the                         jejunum. The upper GI endoscopy was  accomplished                         without difficulty. The patient tolerated the                         procedure well. Findings:      The examined esophagus was normal.      Evidence of a Roux-en-Y gastrojejunostomy was found. The gastrojejunal       anastomosis was characterized by healthy appearing mucosa. The       duodenum-to-jejunum limb was examined. Biopsies were taken with a cold       forceps for histology.      A staple was found at the anastomosis. Removal was accomplished with a       regular forceps.      The examined jejunum was normal. Impression:            - Normal esophagus.                        - Roux-en-Y gastrojejunostomy with gastrojejunal                         anastomosis characterized by healthy appearing mucosa.                         Biopsied.                        - A staple was found in the stomach. Removal was                         successful.                        - Normal examined jejunum. Recommendation:        - Discharge patient to home.                        - Resume previous diet.                        - Continue present medications.                        - Await pathology results. Procedure Code(s):     --- Professional ---                        [id number], Esophagogastroduodenoscopy, flexible,                         transoral; with removal of foreign body(s)  28003, Esophagogastroduodenoscopy, flexible,                         transoral; with biopsy, single or multiple Diagnosis Code(s):     --- Professional ---                        R11.2, Nausea with vomiting, unspecified                        T18.2XXA, Foreign body in stomach, initial encounter CPT copyright 2022 American Medical Association. All rights reserved. The codes documented in this report are preliminary and upon coder review may  be revised to meet current compliance requirements. Lucilla Lame MD, MD 03/16/2022 9:05:55 AM This report has been  signed electronically. Number of Addenda: 0 Note Initiated On: 03/16/2022 8:48 AM Total Procedure Duration: 0 hours 3 minutes 39 seconds  Estimated Blood Loss:  Estimated blood loss: none.      [facility]

## 2022-03-16 NOTE — Anesthesia Preprocedure Evaluation (Addendum)
Anesthesia Evaluation  Patient identified by MRN, date of birth, ID band Patient awake    Reviewed: Allergy & Precautions, H&P , NPO status , Patient's Chart, lab work & pertinent test results  Airway Mallampati: III  TM Distance: >3 FB Neck ROM: full    Dental no notable dental hx.    Pulmonary neg shortness of breath, sleep apnea , COPD, former smoker   Pulmonary exam normal breath sounds clear to auscultation       Cardiovascular hypertension, Normal cardiovascular exam Rhythm:regular Rate:Normal     Neuro/Psych  PSYCHIATRIC DISORDERS         GI/Hepatic ,GERD  ,,  Endo/Other  diabetes  Morbid obesity  Renal/GU      Musculoskeletal   Abdominal   Peds  Hematology   Anesthesia Other Findings   Reproductive/Obstetrics                             Anesthesia Physical Anesthesia Plan  ASA: 3  Anesthesia Plan: General   Post-op Pain Management: Minimal or no pain anticipated   Induction: Intravenous  PONV Risk Score and Plan: 3 and Treatment may vary due to age or medical condition, TIVA and Propofol infusion  Airway Management Planned: Natural Airway  Additional Equipment: None  Intra-op Plan:   Post-operative Plan:   Informed Consent: I have reviewed the patients History and Physical, chart, labs and discussed the procedure including the risks, benefits and alternatives for the proposed anesthesia with the patient or authorized representative who has indicated his/her understanding and acceptance.     Dental Advisory Given  Plan Discussed with: CRNA  Anesthesia Plan Comments: (Discussed risks of anesthesia with patient, including possibility of difficulty with spontaneous ventilation under anesthesia necessitating airway intervention, PONV, and rare risks such as cardiac or respiratory or neurological events, and allergic reactions. Discussed the role of CRNA in patient's  perioperative care. Patient understands.)        Anesthesia Quick Evaluation

## 2022-03-16 NOTE — Transfer of Care (Signed)
Immediate Anesthesia Transfer of Care Note  Patient: Wendy Hubbard  Procedure(s) Performed: ESOPHAGOGASTRODUODENOSCOPY (EGD) WITH PROPOFOL  Patient Location: PACU  Anesthesia Type: General  Level of Consciousness: awake, alert  and patient cooperative  Airway and Oxygen Therapy: Patient Spontanous Breathing and Patient connected to supplemental oxygen  Post-op Assessment: Post-op Vital signs reviewed, Patient's Cardiovascular Status Stable, Respiratory Function Stable, Patent Airway and No signs of Nausea or vomiting  Post-op Vital Signs: Reviewed and stable  Complications: No notable events documented.

## 2022-03-17 ENCOUNTER — Encounter: Payer: Self-pay | Admitting: Gastroenterology

## 2022-03-17 LAB — SURGICAL PATHOLOGY

## 2022-03-20 ENCOUNTER — Ambulatory Visit
Admission: RE | Admit: 2022-03-20 | Discharge: 2022-03-20 | Disposition: A | Discharge status: Home / Self Care | Payer: Medicare Other | Source: Ambulatory Visit | Attending: Nurse Practitioner | Admitting: Nurse Practitioner

## 2022-03-20 DIAGNOSIS — N6323 Unspecified lump in the left breast, lower outer quadrant: Secondary | ICD-10-CM

## 2022-03-20 DIAGNOSIS — N63 Unspecified lump in unspecified breast: Secondary | ICD-10-CM | POA: Diagnosis not present

## 2022-03-20 DIAGNOSIS — R92323 Mammographic fibroglandular density, bilateral breasts: Secondary | ICD-10-CM | POA: Diagnosis not present

## 2022-03-20 DIAGNOSIS — R928 Other abnormal and inconclusive findings on diagnostic imaging of breast: Secondary | ICD-10-CM | POA: Diagnosis not present

## 2022-03-20 DIAGNOSIS — N6489 Other specified disorders of breast: Secondary | ICD-10-CM | POA: Diagnosis not present

## 2022-03-22 NOTE — Progress Notes (Unsigned)
Established patient visit   Patient: Wendy Hubbard   DOB: 06-30-1955   66 y.o. Female  MRN: [id number] Visit Date: 03/23/2022   No chief complaint on file.  Subjective    HPI  Follow up visit  -screening and diagnostic images of left breast  --Targeted ultrasound performed, showing normal tissue without suspicious mass within the left breast 2-230 o'clock 8 cm from the nipple at the site of concern. -diabetes  --due for check HgbA1c    Medications: Outpatient Medications Prior to Visit  Medication Sig   alosetron (LOTRONEX) 1 MG tablet Take 1 tablet (1 mg total) by mouth 2 (two) times daily.   ALPRAZolam (XANAX) 0.5 MG tablet TAKE 1/2 TO 1 TABLET BY MOUTH ONCE DAILYAS NEEDED FOR ACUTE ANXIETY   butalbital-acetaminophen-caffeine (FIORICET) 50-325-40 MG tablet Take 1-2 tablets by mouth every 6 (six) hours as needed for headache.   cilostazol (PLETAL) 100 MG tablet Take 1 tablet (100 mg total) by mouth 2 (two) times daily.   DULoxetine 40 MG CPEP Take 40 mg by mouth daily.   escitalopram (LEXAPRO) 10 MG tablet Take 1 tablet (10 mg total) by mouth every evening.   finasteride (PROSCAR) 5 MG tablet Take 1 tablet (5 mg total) by mouth daily.   furosemide (LASIX) 20 MG tablet TAKE 1 TABLET BY MOUTH ONCE DAILY   loperamide (IMODIUM) 2 MG capsule Take 4 mg po once. Repeat with '2mg'$  po after each loose stool. Max dose is 16 mg/day (Patient not taking: Reported on 03/11/2022)   loratadine (CLARITIN) 10 MG tablet Take 1 tablet (10 mg total) by mouth daily. (Patient not taking: Reported on 03/11/2022)   methocarbamol (ROBAXIN-750) 750 MG tablet Take 1 tablet (750 mg total) by mouth every 8 (eight) hours as needed for muscle spasms.   metoprolol succinate (TOPROL-XL) 50 MG 24 hr tablet TAKE 1 and 1/2 TABLETS BY MOUTH AT BEDTIME (Patient taking differently: 25 mg daily. 03/11/22  Pt currently taking 1/2 tab each evening)   mirtazapine (REMERON) 15 MG tablet TAKE 1 TABLET BY MOUTH AT BEDTIME FOR SLEEP    montelukast (SINGULAIR) 10 MG tablet TAKE 1 TABLET BY MOUTH AT BEDTIME   Multiple Vitamins-Minerals (BARIATRIC MULTIVITAMINS/IRON PO) Take 1 tablet by mouth daily.   pantoprazole (PROTONIX) 40 MG tablet TAKE 1 TABLET BY MOUTH ONCE DAILY   pregabalin (LYRICA) 50 MG capsule TAKE 1 CAPSULE BY MOUTH TWICE DAILY   rOPINIRole (REQUIP) 2 MG tablet Take 1 tablet po QHS   rosuvastatin (CRESTOR) 10 MG tablet TAKE 1 TABLET BY MOUTH ONCE DAILY   Semaglutide,0.25 or 0.'5MG'$ /DOS, (OZEMPIC, 0.25 OR 0.5 MG/DOSE,) 2 MG/3ML SOPN Inject 0.5 mg into the skin once a week. (Patient taking differently: Inject 0.5 mg into the skin once a week. Last dose 02/23/22)   No facility-administered medications prior to visit.    Review of Systems  {Labs (Optional):23779}   Objective    There were no vitals filed for this visit. There is no height or weight on file to calculate BMI.  BP Readings from Last 3 Encounters:  03/16/22 136/64  03/04/22 138/78  02/06/22 (Abnormal) 145/70    Wt Readings from Last 3 Encounters:  03/16/22 190 lb (86.2 kg)  03/04/22 190 lb (86.2 kg)  02/06/22 194 lb (88 kg)    Physical Exam  ***  No results found for any visits on 03/23/22.  Assessment & Plan     Problem List Items Addressed This Visit   None    No follow-ups  on file.         Ronnell Freshwater, NP  [facility] [phone] (phone) [phone] (fax)  [place]

## 2022-03-23 ENCOUNTER — Encounter: Payer: Self-pay | Admitting: Nurse Practitioner

## 2022-03-23 ENCOUNTER — Ambulatory Visit (INDEPENDENT_AMBULATORY_CARE_PROVIDER_SITE_OTHER): Payer: Medicare Other | Admitting: Nurse Practitioner

## 2022-03-23 VITALS — BP 147/76 | HR 95 | Ht 64.0 in | Wt 194.8 lb

## 2022-03-23 DIAGNOSIS — I10 Essential (primary) hypertension: Secondary | ICD-10-CM

## 2022-03-23 DIAGNOSIS — R051 Acute cough: Secondary | ICD-10-CM | POA: Diagnosis not present

## 2022-03-23 DIAGNOSIS — J0100 Acute maxillary sinusitis, unspecified: Secondary | ICD-10-CM | POA: Diagnosis not present

## 2022-03-23 DIAGNOSIS — E1142 Type 2 diabetes mellitus with diabetic polyneuropathy: Secondary | ICD-10-CM

## 2022-03-23 DIAGNOSIS — E782 Mixed hyperlipidemia: Secondary | ICD-10-CM

## 2022-03-23 LAB — POCT GLYCOSYLATED HEMOGLOBIN (HGB A1C): HbA1c POC (<> result, manual entry): 5.3 % (ref 4.0–5.6)

## 2022-03-23 MED ORDER — HYDROCOD POLI-CHLORPHE POLI ER 10-8 MG/5ML PO SUER: 5.0000 mL | Freq: Two times a day (BID) | ORAL | 0 refills | Status: DC | PRN

## 2022-03-23 MED ORDER — LEVOFLOXACIN 500 MG PO TABS: 500.0000 mg | ORAL_TABLET | Freq: Every day | ORAL | 0 refills | Status: DC

## 2022-03-23 NOTE — Progress Notes (Signed)
Reviewed with patient during visit 03/23/2022.

## 2022-03-24 ENCOUNTER — Encounter: Payer: Self-pay | Admitting: Nurse Practitioner

## 2022-03-24 ENCOUNTER — Other Ambulatory Visit: Payer: Self-pay | Admitting: Nurse Practitioner

## 2022-03-24 DIAGNOSIS — J0100 Acute maxillary sinusitis, unspecified: Secondary | ICD-10-CM

## 2022-03-24 DIAGNOSIS — R051 Acute cough: Secondary | ICD-10-CM

## 2022-03-24 MED ORDER — PROMETHAZINE VC 6.25-5 MG/5ML PO SYRP: 5.0000 mL | ORAL_SOLUTION | Freq: Four times a day (QID) | ORAL | 0 refills | Status: DC | PRN

## 2022-03-24 MED ORDER — METHYLPREDNISOLONE 4 MG PO TBPK: ORAL_TABLET | ORAL | 0 refills | Status: DC

## 2022-03-30 ENCOUNTER — Telehealth: Payer: Self-pay

## 2022-03-30 NOTE — Telephone Encounter (Signed)
Pt is calling stating that she is still experiencing a lot of coughing and congestion. Pt states that she feels a rattle in her chest.  Pt is still currently taking the  promethazine-phenylephrine 6.25-5 MG/5ML SYRP    levofloxacin (LEVAQUIN) 500 MG tablet  methylPREDNISolone (MEDROL) 4 MG TBPK tablet   Please advise recommendation that the pt should follow.

## 2022-03-30 NOTE — Telephone Encounter (Signed)
Please let the patient know that she really needs to get a chest x-ray. I recommend that she go to urgent care or ED to be evaluated more quickly.  Thanks  -HB

## 2022-03-31 NOTE — Telephone Encounter (Signed)
Great. Thank you.

## 2022-03-31 NOTE — Telephone Encounter (Signed)
I called patient to give her Leretha Pol, NP recommendation of going to the ED or UC for quicker eval. Pt decided that she will wait it out to see if she gets better.   Pt states that if she doesn't get better in the next few days she will go to [facility] or ED.   FYI

## 2022-04-09 ENCOUNTER — Other Ambulatory Visit: Payer: Self-pay | Admitting: Nurse Practitioner

## 2022-04-09 ENCOUNTER — Encounter: Payer: Self-pay | Admitting: Nurse Practitioner

## 2022-04-09 DIAGNOSIS — J301 Allergic rhinitis due to pollen: Secondary | ICD-10-CM

## 2022-04-09 DIAGNOSIS — F3341 Major depressive disorder, recurrent, in partial remission: Secondary | ICD-10-CM

## 2022-04-09 DIAGNOSIS — G2581 Restless legs syndrome: Secondary | ICD-10-CM

## 2022-04-17 DIAGNOSIS — M79641 Pain in right hand: Secondary | ICD-10-CM | POA: Diagnosis not present

## 2022-04-17 DIAGNOSIS — S60221A Contusion of right hand, initial encounter: Secondary | ICD-10-CM | POA: Diagnosis not present

## 2022-04-22 ENCOUNTER — Ambulatory Visit: Payer: Medicare Other | Admitting: Nurse Practitioner

## 2022-04-22 DIAGNOSIS — Z9884 Bariatric surgery status: Secondary | ICD-10-CM | POA: Diagnosis not present

## 2022-04-29 ENCOUNTER — Ambulatory Visit (INDEPENDENT_AMBULATORY_CARE_PROVIDER_SITE_OTHER): Payer: Medicare Other | Admitting: Nurse Practitioner

## 2022-04-29 ENCOUNTER — Encounter: Payer: Self-pay | Admitting: Nurse Practitioner

## 2022-04-29 VITALS — BP 154/88 | HR 97 | Resp 18 | Ht 64.0 in | Wt 191.0 lb

## 2022-04-29 DIAGNOSIS — R000 Tachycardia, unspecified: Secondary | ICD-10-CM | POA: Diagnosis not present

## 2022-04-29 DIAGNOSIS — R002 Palpitations: Secondary | ICD-10-CM

## 2022-04-29 DIAGNOSIS — I10 Essential (primary) hypertension: Secondary | ICD-10-CM

## 2022-04-29 DIAGNOSIS — R9431 Abnormal electrocardiogram [ECG] [EKG]: Secondary | ICD-10-CM

## 2022-04-29 DIAGNOSIS — S62356D Nondisplaced fracture of shaft of fifth metacarpal bone, right hand, subsequent encounter for fracture with routine healing: Secondary | ICD-10-CM | POA: Diagnosis not present

## 2022-04-29 MED ORDER — METOPROLOL SUCCINATE ER 50 MG PO TB24: 50.0000 mg | ORAL_TABLET | Freq: Every day | ORAL | 1 refills | Status: DC

## 2022-04-29 NOTE — Progress Notes (Signed)
Established patient visit   Patient: Wendy Hubbard   DOB: 1955/12/23   66 y.o. Female  MRN: [id number] Visit Date: 04/29/2022  Chief Complaint  Patient presents with   Headache   Palpitations   Subjective    HPI  Acute visit  -chest pain, -palpitations -shortness of  breath.  -present for a few weeks.  -unchanged.  -made worse by activity.   Medications: Outpatient Medications Prior to Visit  Medication Sig   alosetron (LOTRONEX) 1 MG tablet Take 1 tablet (1 mg total) by mouth 2 (two) times daily.   ALPRAZolam (XANAX) 0.5 MG tablet TAKE 1/2 TO 1 TABLET BY MOUTH ONCE DAILYAS NEEDED FOR ACUTE ANXIETY   butalbital-acetaminophen-caffeine (FIORICET) 50-325-40 MG tablet Take 1-2 tablets by mouth every 6 (six) hours as needed for headache.   cilostazol (PLETAL) 100 MG tablet Take 1 tablet (100 mg total) by mouth 2 (two) times daily.   DULoxetine 40 MG CPEP Take 40 mg by mouth daily.   escitalopram (LEXAPRO) 10 MG tablet Take 1 tablet (10 mg total) by mouth every evening.   finasteride (PROSCAR) 5 MG tablet Take 1 tablet (5 mg total) by mouth daily.   loperamide (IMODIUM) 2 MG capsule Take 4 mg po once. Repeat with '2mg'$  po after each loose stool. Max dose is 16 mg/day   mirtazapine (REMERON) 15 MG tablet TAKE 1 TABLET BY MOUTH AT BEDTIME FOR SLEEP   montelukast (SINGULAIR) 10 MG tablet TAKE 1 TABLET BY MOUTH AT BEDTIME   Multiple Vitamins-Minerals (BARIATRIC MULTIVITAMINS/IRON PO) Take 1 tablet by mouth daily.   pantoprazole (PROTONIX) 40 MG tablet TAKE 1 TABLET BY MOUTH ONCE DAILY   rOPINIRole (REQUIP) 2 MG tablet TAKE 1 TABLET BY MOUTH AT BEDTIME *DOSE INCREASE*   rosuvastatin (CRESTOR) 10 MG tablet TAKE 1 TABLET BY MOUTH ONCE DAILY   [DISCONTINUED] furosemide (LASIX) 20 MG tablet TAKE 1 TABLET BY MOUTH ONCE DAILY   [DISCONTINUED] methocarbamol (ROBAXIN-750) 750 MG tablet Take 1 tablet (750 mg total) by mouth every 8 (eight) hours as needed for muscle spasms.   [DISCONTINUED]  metoprolol succinate (TOPROL-XL) 50 MG 24 hr tablet TAKE 1 and 1/2 TABLETS BY MOUTH AT BEDTIME (Patient taking differently: 25 mg daily. 03/11/22  Pt currently taking 1/2 tab each evening)   [DISCONTINUED] pregabalin (LYRICA) 50 MG capsule TAKE 1 CAPSULE BY MOUTH TWICE DAILY   levofloxacin (LEVAQUIN) 500 MG tablet Take 1 tablet (500 mg total) by mouth daily.   methylPREDNISolone (MEDROL) 4 MG TBPK tablet Take by mouth as directed for 6 days   promethazine-phenylephrine 6.25-5 MG/5ML SYRP Take 5 mLs by mouth every 6 (six) hours as needed for congestion.   Semaglutide,0.25 or 0.'5MG'$ /DOS, (OZEMPIC, 0.25 OR 0.5 MG/DOSE,) 2 MG/3ML SOPN Inject 0.5 mg into the skin once a week. (Patient taking differently: Inject 0.5 mg into the skin once a week. Last dose 02/23/22)   No facility-administered medications prior to visit.    Review of Systems  Constitutional:  Positive for fatigue. Negative for activity change, appetite change, chills and fever.  HENT:  Negative for congestion, postnasal drip, rhinorrhea, sinus pressure, sinus pain, sneezing and sore throat.   Eyes: Negative.   Respiratory:  Positive for shortness of breath. Negative for cough, chest tightness and wheezing.   Cardiovascular:  Positive for chest pain and palpitations.  Gastrointestinal:  Negative for abdominal pain, constipation, diarrhea, nausea and vomiting.  Endocrine: Negative for cold intolerance, heat intolerance, polydipsia and polyuria.       Blood sugars  doing well    Genitourinary:  Negative for dyspareunia, dysuria, flank pain, frequency and urgency.  Musculoskeletal:  Negative for arthralgias, back pain and myalgias.  Skin:  Negative for rash.  Allergic/Immunologic: Positive for environmental allergies.  Neurological:  Positive for headaches. Negative for dizziness and weakness.  Hematological:  Negative for adenopathy.  Psychiatric/Behavioral:  The patient is nervous/anxious.      Objective     Today's Vitals    04/29/22 1607  BP: (Abnormal) 154/88  Pulse: 97  Resp: 18  SpO2: 96%  Weight: 191 lb (86.6 kg)  Height: '5\' 4"'$  (1.626 m)   Body mass index is 32.79 kg/m.   Physical Exam Vitals and nursing note reviewed.  Constitutional:      Appearance: Normal appearance. She is well-developed.  HENT:     Head: Normocephalic and atraumatic.     Nose: Nose normal.     Mouth/Throat:     Mouth: Mucous membranes are moist.     Pharynx: Oropharynx is clear.  Eyes:     Extraocular Movements: Extraocular movements intact.     Conjunctiva/sclera: Conjunctivae normal.     Pupils: Pupils are equal, round, and reactive to light.  Cardiovascular:     Rate and Rhythm: Normal rate and regular rhythm.     Pulses: Normal pulses.     Heart sounds: Normal heart sounds.     Comments: Abnormal ECG Pulmonary:     Effort: Pulmonary effort is normal.     Breath sounds: Normal breath sounds.  Abdominal:     Palpations: Abdomen is soft.  Musculoskeletal:        General: Normal range of motion.     Cervical back: Normal range of motion. No rigidity.  Lymphadenopathy:     Cervical: No cervical adenopathy.  Skin:    General: Skin is warm and dry.     Capillary Refill: Capillary refill takes less than 2 seconds.  Neurological:     General: No focal deficit present.     Mental Status: She is alert and oriented to person, place, and time.  Psychiatric:        Mood and Affect: Mood normal.        Behavior: Behavior normal.        Thought Content: Thought content normal.        Judgment: Judgment normal.       Assessment & Plan     1. Rapid resting heart rate Abnormal ECG. Refer to cardiology for further evaluation and treatment  - EKG 12-Lead  2. Palpitations Increase metoprolol XL to 50 mg daily. Refer to cardiology for further evaluation.  - metoprolol succinate (TOPROL-XL) 50 MG 24 hr tablet; Take 1 tablet (50 mg total) by mouth daily.  Dispense: 90 tablet; Refill: 1 - Ambulatory referral to  Cardiology  3. Essential hypertension Increase metoprolol XL to 50 mg daily.  - metoprolol succinate (TOPROL-XL) 50 MG 24 hr tablet; Take 1 tablet (50 mg total) by mouth daily.  Dispense: 90 tablet; Refill: 1  4. Abnormal ECG Refer to cardiology as scheduled.  - Ambulatory referral to Cardiology   Return for as scheduled.        Ronnell Freshwater, NP  [facility] [phone] (phone) [phone] (fax)  [place]

## 2022-05-01 ENCOUNTER — Other Ambulatory Visit: Payer: Self-pay | Admitting: Nurse Practitioner

## 2022-05-01 DIAGNOSIS — M792 Neuralgia and neuritis, unspecified: Secondary | ICD-10-CM

## 2022-05-01 DIAGNOSIS — M7918 Myalgia, other site: Secondary | ICD-10-CM

## 2022-05-01 DIAGNOSIS — M5442 Lumbago with sciatica, left side: Secondary | ICD-10-CM

## 2022-05-01 DIAGNOSIS — M5441 Lumbago with sciatica, right side: Secondary | ICD-10-CM

## 2022-05-01 DIAGNOSIS — G629 Polyneuropathy, unspecified: Secondary | ICD-10-CM

## 2022-05-01 DIAGNOSIS — M542 Cervicalgia: Secondary | ICD-10-CM

## 2022-05-06 DIAGNOSIS — S62356D Nondisplaced fracture of shaft of fifth metacarpal bone, right hand, subsequent encounter for fracture with routine healing: Secondary | ICD-10-CM | POA: Diagnosis not present

## 2022-05-12 ENCOUNTER — Other Ambulatory Visit: Payer: Self-pay | Admitting: Nurse Practitioner

## 2022-05-12 DIAGNOSIS — G629 Polyneuropathy, unspecified: Secondary | ICD-10-CM

## 2022-05-12 DIAGNOSIS — S62356D Nondisplaced fracture of shaft of fifth metacarpal bone, right hand, subsequent encounter for fracture with routine healing: Secondary | ICD-10-CM | POA: Diagnosis not present

## 2022-05-13 DIAGNOSIS — S62356D Nondisplaced fracture of shaft of fifth metacarpal bone, right hand, subsequent encounter for fracture with routine healing: Secondary | ICD-10-CM | POA: Diagnosis not present

## 2022-05-14 DIAGNOSIS — S62356D Nondisplaced fracture of shaft of fifth metacarpal bone, right hand, subsequent encounter for fracture with routine healing: Secondary | ICD-10-CM | POA: Diagnosis not present

## 2022-05-26 ENCOUNTER — Encounter: Payer: Self-pay | Admitting: Nurse Practitioner

## 2022-05-26 ENCOUNTER — Encounter: Payer: Self-pay | Admitting: Cardiology

## 2022-05-26 ENCOUNTER — Ambulatory Visit (INDEPENDENT_AMBULATORY_CARE_PROVIDER_SITE_OTHER): Payer: Medicare Other

## 2022-05-26 ENCOUNTER — Ambulatory Visit: Payer: Medicare Other | Attending: Cardiology | Admitting: Cardiology

## 2022-05-26 VITALS — BP 142/84 | HR 85 | Ht 64.0 in | Wt 195.6 lb

## 2022-05-26 DIAGNOSIS — R002 Palpitations: Secondary | ICD-10-CM | POA: Diagnosis not present

## 2022-05-26 DIAGNOSIS — Z01812 Encounter for preprocedural laboratory examination: Secondary | ICD-10-CM | POA: Diagnosis not present

## 2022-05-26 DIAGNOSIS — R072 Precordial pain: Secondary | ICD-10-CM

## 2022-05-26 DIAGNOSIS — E782 Mixed hyperlipidemia: Secondary | ICD-10-CM

## 2022-05-26 MED ORDER — METOPROLOL TARTRATE 100 MG PO TABS: 100.0000 mg | ORAL_TABLET | ORAL | 0 refills | Status: DC

## 2022-05-26 NOTE — Progress Notes (Unsigned)
Enrolled for [employer] to mail a ZIO XT Ballo term holter monitor to the patients address on file.  

## 2022-05-26 NOTE — Progress Notes (Signed)
Cardiology Office Note:    Date:  05/26/2022   ID:  Wendy Hubbard, DOB 10/05/1955, MRN [id number]  PCP:  Wendy Freshwater, NP   Wendy Hubbard Providers Cardiologist:  None     Referring MD: Wendy Freshwater, NP    History of Present Illness:    Wendy Hubbard is a 67 y.o. female here for the evaluation of arrhythmia at the request of Wendy Pol, NP. Can go up to 119-152 bpm when watching TV. Also had CP severe, heart beating out of chest. After 6 hours subsided. Almost went to ER.  Also notes HA from left neck. Top of head, severe pain. Wendy Hubbard daughter here with her.   She is a new Mini Dachshund puppy.  Unfortunately fell, broke a bone in her wrist.  Past Medical History:  Diagnosis Date   Allergy    Phreesia 07/23/2020   Anxiety    Phreesia 07/23/2020   COPD (chronic obstructive pulmonary disease) (HCC)    DDD (degenerative disc disease), thoracic    Depression    Phreesia 07/23/2020   Diabetes mellitus without complication (HCC)    type 2   GERD (gastroesophageal reflux disease)    Hyperlipidemia    Nausea and vomiting 03/04/2022   Neuromuscular disorder (HCC)    severe neuropathy feet   Pneumonia    Rapid heart rate    Sleep apnea    no longer uses CPAP due to weight loss   Wears contact lenses     Past Surgical History:  Procedure Laterality Date   ABDOMINAL HYSTERECTOMY     ANKLE ARTHROSCOPY Bilateral    APPENDECTOMY     BLADDER SUSPENSION     BREAST EXCISIONAL BIOPSY Left 1990   neg   BREAST EXCISIONAL BIOPSY Right 1989   neg   BREAST SURGERY     CHOLECYSTECTOMY     COLONOSCOPY N/A 11/04/2015   Procedure: COLONOSCOPY;  Surgeon: Wendy Silvas, MD;  Location: [facility];  Service: Endoscopy;  Laterality: N/A;   COLONOSCOPY WITH PROPOFOL N/A 08/30/2020   Procedure: COLONOSCOPY WITH PROPOFOL;  Surgeon: Wendy Lame, MD;  Location: [facility];  Service: Endoscopy;  Laterality: N/A;   ESOPHAGOGASTRODUODENOSCOPY N/A 11/02/2015    Procedure: ESOPHAGOGASTRODUODENOSCOPY (EGD);  Surgeon: Wendy Silvas, MD;  Location: [facility];  Service: Endoscopy;  Laterality: N/A;   ESOPHAGOGASTRODUODENOSCOPY (EGD) WITH PROPOFOL N/A 03/16/2022   Procedure: ESOPHAGOGASTRODUODENOSCOPY (EGD) WITH PROPOFOL foregin body removal;  Surgeon: Wendy Lame, MD;  Location: [facility];  Service: Endoscopy;  Laterality: N/A;  Diabetic   GASTROJEJUNOSTOMY N/A 08/25/2021   Procedure: LAPAROSCOPIC REDO OF STOMACH TO INTESTINE;  Surgeon: Wendy Brightly, Arta Bruce, MD;  Location: [facility];  Service: General;  Laterality: N/A;   KNEE ARTHROSCOPY Left    PARTIAL GASTRECTOMY N/A 08/25/2021   Procedure: PARTIAL GASTRECTOMY;  Surgeon: Wendy Brightly Arta Bruce, MD;  Location: [facility];  Service: General;  Laterality: N/A;   POLYPECTOMY  08/30/2020   Procedure: POLYPECTOMY;  Surgeon: Wendy Lame, MD;  Location: [facility];  Service: Endoscopy;;   REDUCTION MAMMAPLASTY Bilateral 1988   TONSILLECTOMY     UPPER GI ENDOSCOPY  08/25/2021   Procedure: UPPER GI ENDOSCOPY;  Surgeon: Wendy Skinner, MD;  Location: [facility];  Service: General;;    Current Medications: Current Meds  Medication Sig   alosetron (LOTRONEX) 1 MG tablet Take 1 tablet (1 mg total) by mouth 2 (two) times daily.   ALPRAZolam (XANAX) 0.5 MG tablet TAKE 1/2 TO 1  TABLET BY MOUTH ONCE DAILYAS NEEDED FOR ACUTE ANXIETY   butalbital-acetaminophen-caffeine (FIORICET) 50-325-40 MG tablet Take 1-2 tablets by mouth every 6 (six) hours as needed for headache.   cilostazol (PLETAL) 100 MG tablet Take 1 tablet (100 mg total) by mouth 2 (two) times daily.   DULoxetine (CYMBALTA) 20 MG capsule TAKE 1 CAPSULE BY MOUTH ONCE DAILY   DULoxetine 40 MG CPEP Take 40 mg by mouth daily.   escitalopram (LEXAPRO) 10 MG tablet Take 1 tablet (10 mg total) by mouth every evening.   finasteride (PROSCAR) 5 MG tablet Take 1 tablet (5 mg total) by mouth daily.   furosemide (LASIX) 20 MG tablet TAKE 1 TABLET BY  MOUTH ONCE DAILY   loperamide (IMODIUM) 2 MG capsule Take 4 mg po once. Repeat with '2mg'$  po after each loose stool. Max dose is 16 mg/day   methocarbamol (ROBAXIN) 750 MG tablet TAKE 1 TABLET BY MOUTH EVERY 8 HOURS AS NEEDED FOR MUSCLE SPASMS   metoprolol succinate (TOPROL-XL) 50 MG 24 hr tablet Take 1 tablet (50 mg total) by mouth daily.   metoprolol tartrate (LOPRESSOR) 100 MG tablet Take 1 tablet (100 mg total) by mouth as directed. Take (1) tablet two hours before your CT scan   mirtazapine (REMERON) 15 MG tablet TAKE 1 TABLET BY MOUTH AT BEDTIME FOR SLEEP   montelukast (SINGULAIR) 10 MG tablet TAKE 1 TABLET BY MOUTH AT BEDTIME   Multiple Vitamins-Minerals (BARIATRIC MULTIVITAMINS/IRON PO) Take 1 tablet by mouth daily.   pantoprazole (PROTONIX) 40 MG tablet TAKE 1 TABLET BY MOUTH ONCE DAILY   pregabalin (LYRICA) 50 MG capsule TAKE 1 CAPSULE BY MOUTH TWICE DAILY   promethazine-phenylephrine 6.25-5 MG/5ML SYRP Take 5 mLs by mouth every 6 (six) hours as needed for congestion.   rOPINIRole (REQUIP) 2 MG tablet TAKE 1 TABLET BY MOUTH AT BEDTIME *DOSE INCREASE*   rosuvastatin (CRESTOR) 10 MG tablet TAKE 1 TABLET BY MOUTH ONCE DAILY   Semaglutide,0.25 or 0.'5MG'$ /DOS, (OZEMPIC, 0.25 OR 0.5 MG/DOSE,) 2 MG/3ML SOPN Inject 0.5 mg into the skin once a week. (Patient taking differently: Inject 0.5 mg into the skin once a week. Last dose 02/23/22)     Allergies:   Flagyl [metronidazole], Penicillins, and Doxycycline   Social History   Socioeconomic History   Marital status: Married    Spouse name: Not on file   Number of children: Not on file   Years of education: Not on file   Highest education level: Not on file  Occupational History   Not on file  Tobacco Use   Smoking status: Former    Years: 20.00    Types: Cigarettes    Quit date: 1996    Years since quitting: 28.0   Smokeless tobacco: Never   Tobacco comments:    quit in 1996  Vaping Use   Vaping Use: Never used  Substance and Sexual  Activity   Alcohol use: Yes    Alcohol/week: 2.0 standard drinks of alcohol    Types: 1 Glasses of wine, 1 Standard drinks or equivalent per week    Comment: occasional  social   Drug use: No   Sexual activity: Yes    Partners: Male    Birth control/protection: Surgical, Other-see comments  Other Topics Concern   Not on file  Social History Narrative   Not on file   Social Determinants of Health   Financial Resource Strain: Not on file  Food Insecurity: Not on file  Transportation Needs: Not on file  Physical Activity: Not on file  Stress: Not on file  Social Connections: Not on file     Family History: The patient's family history includes Bladder Cancer in her mother; Cancer in her mother; Heart attack in her father; High Cholesterol in her mother; High blood pressure in her father and mother; Hypertension in an other family member. There is no history of Kidney cancer, Prostate cancer, or Breast cancer.  ROS:   Please see the history of present illness.    No fevers chills nausea vomiting syncope bleeding.  All other systems reviewed and are negative.  EKGs/Labs/Other Studies Reviewed:    The following studies were reviewed today: Prior stress echo on 02/19/2016-EF 55% normal.  EKG:  The ekg ordered today demonstrates 05/26/2022-sinus rhythm 85 no other abnormalities.  Recent Labs: 08/12/2021: ALT 18 08/27/2021: Magnesium 2.2 08/28/2021: BUN 10; Creatinine, Ser 0.80; Hemoglobin 10.4; Platelets 236; Sodium 143 03/12/2022: Potassium 4.4  Recent Lipid Panel    Component Value Date/Time   CHOL 174 08/07/2020 0938   TRIG 259 (H) 08/07/2020 0938   HDL 57 08/07/2020 0938   CHOLHDL 3.1 08/07/2020 0938   LDLCALC 75 08/07/2020 0938     Risk Assessment/Calculations:              Physical Exam:    VS:  BP (!) 142/84   Pulse 85   Ht '5\' 4"'$  (1.626 m)   Wt 195 lb 9.6 oz (88.7 kg)   SpO2 96%   BMI 33.57 kg/m     Wt Readings from Last 3 Encounters:  05/26/22 195 lb  9.6 oz (88.7 kg)  04/29/22 191 lb (86.6 kg)  03/23/22 194 lb 12.8 oz (88.4 kg)     GEN:  Well nourished, well developed in no acute distress HEENT: Normal NECK: No JVD; No carotid bruits LYMPHATICS: No lymphadenopathy CARDIAC: RRR, no murmurs, rubs, gallops RESPIRATORY:  Clear to auscultation without rales, wheezing or rhonchi  ABDOMEN: Soft, non-tender, non-distended MUSCULOSKELETAL:  No edema; No deformity  SKIN: Warm and dry NEUROLOGIC:  Alert and oriented x 3 PSYCHIATRIC:  Normal affect   ASSESSMENT:    1. Palpitations   2. Precordial pain   3. Pre-procedure lab exam   4. Mixed hyperlipidemia    PLAN:    In order of problems listed above:  Irregular heartbeat - Go ahead and check a ZIO monitor.  We want to exclude atrial fibrillation.  This would change ongoing management.  Differential includes sinus tachycardia, atrial tachycardia, atrial fibrillation -If she needs to, she can take an additional metoprolol in the setting of rapid heartbeat - TSH from outside labs 2.1.  Precordial chest pain - Noted chest discomfort at one point was quite severe left-sided during a period of tachycardia.  Risk factors include diabetes hypertension former smoker. -Lets go ahead and check a coronary CT scan with flow analysis if necessary. -We will also check an echocardiogram to ensure proper structure and function of her heart.  Hyperlipidemia - Continue with Crestor 10 mg once a day LDL from outside labs 75 last check.  Headaches - Asked her to speak with her primary neurologist about this.  They may have some suggestions.  I do not appreciate any carotid bruits.  Her parents did have carotid artery disease.           Medication Adjustments/Labs and Tests Ordered: Current medicines are reviewed at length with the patient today.  Concerns regarding medicines are outlined above.  Orders Placed This Encounter  Procedures  CT CORONARY MORPH W/CTA COR W/SCORE W/CA W/CM &/OR  WO/CM   Basic metabolic panel   [facility] TERM MONITOR (3-14 DAYS)   EKG 12-Lead   ECHOCARDIOGRAM COMPLETE   Meds ordered this encounter  Medications   metoprolol tartrate (LOPRESSOR) 100 MG tablet    Sig: Take 1 tablet (100 mg total) by mouth as directed. Take (1) tablet two hours before your CT scan    Dispense:  1 tablet    Refill:  0    Patient Instructions  Medication Instructions:  The current medical regimen is effective;  continue present plan and medications.  *If you need a refill on your cardiac medications before your next appointment, please call your pharmacy*  Lab Work: Please have blood work today (BMP)  If you have labs (blood work) drawn today and your tests are completely normal, you will receive your results only by: MyChart Message (if you have MyChart) OR A paper copy in the mail If you have any lab test that is abnormal or we need to change your treatment, we will call you to review the results.  Testing/Procedures: Your physician has requested that you have an echocardiogram. Echocardiography is a painless test that uses sound waves to create images of your heart. It provides your doctor with information about the size and shape of your heart and how well your heart's chambers and valves are working. This procedure takes approximately one hour. There are no restrictions for this procedure. Please do NOT wear cologne, perfume, aftershave, or lotions (deodorant is allowed). Please arrive 15 minutes prior to your appointment time.    Your cardiac CT will be scheduled at:   [facility] [address] [place], [place] [address] [phone]  Please arrive at the [facility] and Children's Entrance (Entrance C2) of [facility] 30 minutes prior to test start time. You can use the FREE valet parking offered at entrance C (encouraged to control the heart rate for the test)  Proceed to the [facility] Radiology Department (first floor) to check-in  and test prep.  All radiology patients and guests should use entrance C2 at [facility], accessed from [facility], even though the hospital's physical address listed is [address]    Please follow these instructions carefully (unless otherwise directed):  On the Night Before the Test: Be sure to Drink plenty of water. Do not consume any caffeinated/decaffeinated beverages or chocolate 12 hours prior to your test. Do not take any antihistamines 12 hours prior to your test.  On the Day of the Test: Drink plenty of water until 1 hour prior to the test. Do not eat any food 1 hour prior to test. You may take your regular medications prior to the test.  Take metoprolol (Lopressor) two hours prior to test. HOLD Furosemide/Hydrochlorothiazide morning of the test. FEMALES- please wear underwire-free bra if available, avoid dresses & tight clothing  After the Test: Drink plenty of water. After receiving IV contrast, you may experience a mild flushed feeling. This is normal. On occasion, you may experience a mild rash up to 24 hours after the test. This is not dangerous. If this occurs, you can take Benadryl 25 mg and increase your fluid intake. If you experience trouble breathing, this can be serious. If it is severe call 911 IMMEDIATELY. If it is mild, please call our office. If you take any of these medications: Glipizide/Metformin, Avandament, Glucavance, please do not take 48 hours after completing test  unless otherwise instructed.  We will call to schedule your test 2-4 weeks out understanding that some insurance companies will need an authorization prior to the service being performed.   For non-scheduling related questions, please contact the cardiac imaging nurse navigator should you have any questions/concerns: Marchia Bond, Cardiac Imaging Nurse Navigator Gordy Clement, Cardiac Imaging Nurse Navigator [facility] Direct  Office Dial: [phone]   For scheduling needs, including cancellations and rescheduling, please call [place], [phone].  ZIO XT- [place] Term Monitor Instructions  Your physician has requested you wear a ZIO patch monitor for 14 days.  This is a single patch monitor. [employer] supplies one patch monitor per enrollment. Additional stickers are not available. Please do not apply patch if you will be having a Nuclear Stress Test,  Echocardiogram, Cardiac CT, MRI, or Chest Xray during the period you would be wearing the  monitor. The patch cannot be worn during these tests. You cannot remove and re-apply the  ZIO XT patch monitor.  Your ZIO patch monitor will be mailed 3 day USPS to your address on file. It may take 3-5 days  to receive your monitor after you have been enrolled.  Once you have received your monitor, please review the enclosed instructions. Your monitor  has already been registered assigning a specific monitor serial # to you.  Billing and Patient Assistance Program Information  We have supplied [employer] with any of your insurance information on file for billing purposes. [employer] offers a sliding scale Patient Assistance Program for patients that do not have  insurance, or whose insurance does not completely cover the cost of the ZIO monitor.  You must apply for the Patient Assistance Program to qualify for this discounted rate.  To apply, please call [employer] at [phone], select option 4, select option 2, ask to apply for  Patient Assistance Program. Theodore Demark will ask your household income, and how many people  are in your household. They will quote your out-of-pocket cost based on that information.  [employer] will also be able to set up a 85-month interest-free payment plan if needed.  Applying the monitor   Shave hair from upper left chest.  Hold abrader disc by orange tab. Rub abrader in 40 strokes over the upper left chest as  indicated in your monitor  instructions.  Clean area with 4 enclosed alcohol pads. Let dry.  Apply patch as indicated in monitor instructions. Patch will be placed under collarbone on left  side of chest with arrow pointing upward.  Rub patch adhesive wings for 2 minutes. Remove white label marked "1". Remove the white  label marked "2". Rub patch adhesive wings for 2 additional minutes.  While looking in a mirror, press and release button in center of patch. A small green light will  flash 3-4 times. This will be your only indicator that the monitor has been turned on.  Do not shower for the first 24 hours. You may shower after the first 24 hours.  Press the button if you feel a symptom. You will hear a small click. Record Date, Time and  Symptom in the Patient Logbook.  When you are ready to remove the patch, follow instructions on the last 2 pages of Patient  Logbook. Stick patch monitor onto the last page of Patient Logbook.  Place Patient Logbook in the blue and white box. Use locking tab on box and tape box closed  securely. The blue and white box has prepaid postage on it.  Please place it in the mailbox as  soon as possible. Your physician should have your test results approximately 7 days after the  monitor has been mailed back to [facility].  Call [place] at [phone] if you have questions regarding  your ZIO XT patch monitor. Call them immediately if you see an orange light blinking on your  monitor.  If your monitor falls off in less than 4 days, contact our Monitor department at [phone].  If your monitor becomes loose or falls off after 4 days call [employer] at [phone] for  suggestions on securing your monitor   Follow-Up: At [facility], you and your health needs are our priority.  As part of our continuing mission to provide you with exceptional heart care, we have created designated Provider Care Teams.  These Care Teams include your primary Cardiologist  (physician) and Advanced Practice Providers (APPs -  Physician Assistants and Nurse Practitioners) who all work together to provide you with the care you need, when you need it.  We recommend signing up for the patient portal called "MyChart".  Sign up information is provided on this After Visit Summary.  MyChart is used to connect with patients for Virtual Visits (Telemedicine).  Patients are able to view lab/test results, encounter notes, upcoming appointments, etc.  Non-urgent messages can be sent to your provider as well.   To learn more about what you can do with MyChart, go to [email or web address].    Your next appointment:   Follow up will be based on the results of the above testing.     Signed, Candee Furbish, MD  05/26/2022 5:17 PM    [place]

## 2022-05-26 NOTE — Patient Instructions (Addendum)
Medication Instructions:  The current medical regimen is effective;  continue present plan and medications.  *If you need a refill on your cardiac medications before your next appointment, please call your pharmacy*  Lab Work: Please have blood work today (BMP)  If you have labs (blood work) drawn today and your tests are completely normal, you will receive your results only by: MyChart Message (if you have MyChart) OR A paper copy in the mail If you have any lab test that is abnormal or we need to change your treatment, we will call you to review the results.  Testing/Procedures: Your physician has requested that you have an echocardiogram. Echocardiography is a painless test that uses sound waves to create images of your heart. It provides your doctor with information about the size and shape of your heart and how well your heart's chambers and valves are working. This procedure takes approximately one hour. There are no restrictions for this procedure. Please do NOT wear cologne, perfume, aftershave, or lotions (deodorant is allowed). Please arrive 15 minutes prior to your appointment time.    Your cardiac CT will be scheduled at:   [facility] [address] [place], [place] [address] [phone]  Please arrive at the [facility] (Entrance C2) of [facility] 30 minutes prior to test start time. You can use the FREE valet parking offered at entrance C (encouraged to control the heart rate for the test)  Proceed to the [facility] Radiology Department (first floor) to check-in and test prep.  All radiology patients and guests should use entrance C2 at [facility], accessed from [facility], even though the hospital's physical address listed is [address].    Please follow these instructions carefully (unless otherwise directed):  On the Night Before the Test: Be sure to Drink plenty of water. Do not  consume any caffeinated/decaffeinated beverages or chocolate 12 hours prior to your test. Do not take any antihistamines 12 hours prior to your test.  On the Day of the Test: Drink plenty of water until 1 hour prior to the test. Do not eat any food 1 hour prior to test. You may take your regular medications prior to the test.  Take metoprolol (Lopressor) two hours prior to test. HOLD Furosemide/Hydrochlorothiazide morning of the test. FEMALES- please wear underwire-free bra if available, avoid dresses & tight clothing  After the Test: Drink plenty of water. After receiving IV contrast, you may experience a mild flushed feeling. This is normal. On occasion, you may experience a mild rash up to 24 hours after the test. This is not dangerous. If this occurs, you can take Benadryl 25 mg and increase your fluid intake. If you experience trouble breathing, this can be serious. If it is severe call 911 IMMEDIATELY. If it is mild, please call our office. If you take any of these medications: Glipizide/Metformin, Avandament, Glucavance, please do not take 48 hours after completing test unless otherwise instructed.  We will call to schedule your test 2-4 weeks out understanding that some insurance companies will need an authorization prior to the service being performed.   For non-scheduling related questions, please contact the cardiac imaging nurse navigator should you have any questions/concerns: Marchia Bond, Cardiac Imaging Nurse Navigator Gordy Clement, Cardiac Imaging Nurse Navigator [facility] Direct Office Dial: [phone]   For scheduling needs, including cancellations and rescheduling, please call [place], [phone].  ZIO XT- [place] Term Monitor Instructions  Your physician has requested you wear a ZIO patch monitor for 14 days.  This is a single patch monitor. [employer] supplies one patch monitor per enrollment. Additional stickers are not available.  Please do not apply patch if you will be having a Nuclear Stress Test,  Echocardiogram, Cardiac CT, MRI, or Chest Xray during the period you would be wearing the  monitor. The patch cannot be worn during these tests. You cannot remove and re-apply the  ZIO XT patch monitor.  Your ZIO patch monitor will be mailed 3 day USPS to your address on file. It may take 3-5 days  to receive your monitor after you have been enrolled.  Once you have received your monitor, please review the enclosed instructions. Your monitor  has already been registered assigning a specific monitor serial # to you.  Billing and Patient Assistance Program Information  We have supplied [employer] with any of your insurance information on file for billing purposes. [employer] offers a sliding scale Patient Assistance Program for patients that do not have  insurance, or whose insurance does not completely cover the cost of the ZIO monitor.  You must apply for the Patient Assistance Program to qualify for this discounted rate.  To apply, please call [employer] at [phone], select option 4, select option 2, ask to apply for  Patient Assistance Program. Theodore Demark will ask your household income, and how many people  are in your household. They will quote your out-of-pocket cost based on that information.  [employer] will also be able to set up a 73-month interest-free payment plan if needed.  Applying the monitor   Shave hair from upper left chest.  Hold abrader disc by orange tab. Rub abrader in 40 strokes over the upper left chest as  indicated in your monitor instructions.  Clean area with 4 enclosed alcohol pads. Let dry.  Apply patch as indicated in monitor instructions. Patch will be placed under collarbone on left  side of chest with arrow pointing upward.  Rub patch adhesive wings for 2 minutes. Remove white label marked "1". Remove the white  label marked "2". Rub patch adhesive wings for 2 additional minutes.  While  looking in a mirror, press and release button in center of patch. A small green light will  flash 3-4 times. This will be your only indicator that the monitor has been turned on.  Do not shower for the first 24 hours. You may shower after the first 24 hours.  Press the button if you feel a symptom. You will hear a small click. Record Date, Time and  Symptom in the Patient Logbook.  When you are ready to remove the patch, follow instructions on the last 2 pages of Patient  Logbook. Stick patch monitor onto the last page of Patient Logbook.  Place Patient Logbook in the blue and white box. Use locking tab on box and tape box closed  securely. The blue and white box has prepaid postage on it. Please place it in the mailbox as  soon as possible. Your physician should have your test results approximately 7 days after the  monitor has been [facility]  Call [place]at [phone] you have questions regarding  your ZIO XT patch monitor. Call them immediately if you see an orange light blinking on your  monitor.  If your monitor falls off in less than 4 days, contact our Monitor department at [phone]  If your monitor becomes loose or falls off  after 4 days call [employer] at [phone] for  suggestions on securing your monitor   Follow-Up: At [facility], you and your health needs are our priority.  As part of our continuing mission to provide you with exceptional heart care, we have created designated Provider Care Teams.  These Care Teams include your primary Cardiologist (physician) and Advanced Practice Providers (APPs -  Physician Assistants and Nurse Practitioners) who all work together to provide you with the care you need, when you need it.  We recommend signing up for the patient portal called "MyChart".  Sign up information is provided on this After Visit Summary.  MyChart is used to connect with patients for Virtual Visits  (Telemedicine).  Patients are able to view lab/test results, encounter notes, upcoming appointments, etc.  Non-urgent messages can be sent to your provider as well.   To learn more about what you can do with MyChart, go to [email or web address].    Your next appointment:   Follow up will be based on the results of the above testing.

## 2022-05-27 LAB — BASIC METABOLIC PANEL
BUN/Creatinine Ratio: 21 (ref 12–28)
BUN: 18 mg/dL (ref 8–27)
CO2: 25 mmol/L (ref 20–29)
Calcium: 9.6 mg/dL (ref 8.7–10.3)
Chloride: 103 mmol/L (ref 96–106)
Creatinine, Ser: 0.86 mg/dL (ref 0.57–1.00)
Glucose: 74 mg/dL (ref 70–99)
Potassium: 4.4 mmol/L (ref 3.5–5.2)
Sodium: 141 mmol/L (ref 134–144)
eGFR: 74 mL/min/{1.73_m2} (ref >59)

## 2022-05-28 DIAGNOSIS — S62356D Nondisplaced fracture of shaft of fifth metacarpal bone, right hand, subsequent encounter for fracture with routine healing: Secondary | ICD-10-CM | POA: Diagnosis not present

## 2022-05-29 ENCOUNTER — Telehealth (HOSPITAL_COMMUNITY): Payer: Self-pay | Admitting: *Deleted

## 2022-05-29 NOTE — Telephone Encounter (Signed)
Reaching out to patient to offer assistance regarding upcoming cardiac imaging study; pt verbalizes understanding of appt date/time, parking situation and where to check in, pre-test NPO status and medications ordered, and verified current allergies; name and call back number provided for further questions should they arise  Gordy Clement RN Navigator Cardiac Imaging [facility] [phone] office [phone] cell  Patient to take her AM metoprolol and also '100mg'$  metoprolol tartrate two hours prior to her cardiac CT scan.  She is aware to arrive at 2:30pm.

## 2022-05-29 NOTE — Telephone Encounter (Signed)
Attempted to call patient regarding upcoming cardiac CT appointment. °Left message on voicemail with name and callback number ° °Elliana Bal RN Navigator Cardiac Imaging °[facility] °[phone] Office °[phone] Cell ° °

## 2022-05-31 DIAGNOSIS — R9431 Abnormal electrocardiogram [ECG] [EKG]: Secondary | ICD-10-CM | POA: Insufficient documentation

## 2022-05-31 DIAGNOSIS — R000 Tachycardia, unspecified: Secondary | ICD-10-CM | POA: Insufficient documentation

## 2022-06-01 ENCOUNTER — Ambulatory Visit (HOSPITAL_COMMUNITY)
Admission: RE | Admit: 2022-06-01 | Discharge: 2022-06-01 | Disposition: A | Discharge status: Home / Self Care | Payer: Medicare Other | Source: Ambulatory Visit | Attending: Cardiology | Admitting: Cardiology

## 2022-06-01 DIAGNOSIS — R072 Precordial pain: Secondary | ICD-10-CM | POA: Insufficient documentation

## 2022-06-01 MED ORDER — IOHEXOL 350 MG/ML SOLN
100.0000 mL | Freq: Once | INTRAVENOUS | Status: AC | PRN
  Administered 2022-06-01: 100 mL via INTRAVENOUS

## 2022-06-01 MED ORDER — NITROGLYCERIN 0.4 MG SL SUBL
0.8000 mg | SUBLINGUAL_TABLET | Freq: Once | SUBLINGUAL | Status: AC
  Administered 2022-06-01: 0.8 mg via SUBLINGUAL

## 2022-06-01 MED ORDER — METOPROLOL TARTRATE 5 MG/5ML IV SOLN
INTRAVENOUS | Status: AC
  Filled 2022-06-01: qty 10

## 2022-06-01 MED ORDER — METOPROLOL TARTRATE 5 MG/5ML IV SOLN
5.0000 mg | INTRAVENOUS | Status: DC | PRN
  Administered 2022-06-01: 5 mg via INTRAVENOUS

## 2022-06-01 MED ORDER — NITROGLYCERIN 0.4 MG SL SUBL
SUBLINGUAL_TABLET | SUBLINGUAL | Status: AC
  Filled 2022-06-01: qty 2

## 2022-06-03 ENCOUNTER — Other Ambulatory Visit: Payer: Self-pay | Admitting: Nurse Practitioner

## 2022-06-03 DIAGNOSIS — F411 Generalized anxiety disorder: Secondary | ICD-10-CM

## 2022-06-03 DIAGNOSIS — J301 Allergic rhinitis due to pollen: Secondary | ICD-10-CM

## 2022-06-03 DIAGNOSIS — R002 Palpitations: Secondary | ICD-10-CM

## 2022-06-22 ENCOUNTER — Other Ambulatory Visit: Payer: Self-pay | Admitting: Nurse Practitioner

## 2022-06-22 DIAGNOSIS — N644 Mastodynia: Secondary | ICD-10-CM

## 2022-06-22 DIAGNOSIS — N6323 Unspecified lump in the left breast, lower outer quadrant: Secondary | ICD-10-CM

## 2022-06-23 ENCOUNTER — Encounter: Payer: Self-pay | Admitting: *Deleted

## 2022-06-25 ENCOUNTER — Emergency Department
Admission: EM | Admit: 2022-06-25 | Discharge: 2022-06-25 | Disposition: A | Discharge status: Home / Self Care | Payer: Medicare Other | Attending: Emergency Medicine | Admitting: Emergency Medicine

## 2022-06-25 ENCOUNTER — Other Ambulatory Visit: Payer: Self-pay

## 2022-06-25 ENCOUNTER — Emergency Department: Payer: Medicare Other

## 2022-06-25 ENCOUNTER — Encounter: Payer: Self-pay | Admitting: Emergency Medicine

## 2022-06-25 DIAGNOSIS — M1712 Unilateral primary osteoarthritis, left knee: Secondary | ICD-10-CM | POA: Diagnosis not present

## 2022-06-25 DIAGNOSIS — E119 Type 2 diabetes mellitus without complications: Secondary | ICD-10-CM | POA: Diagnosis not present

## 2022-06-25 DIAGNOSIS — W19XXXA Unspecified fall, initial encounter: Secondary | ICD-10-CM | POA: Insufficient documentation

## 2022-06-25 DIAGNOSIS — J449 Chronic obstructive pulmonary disease, unspecified: Secondary | ICD-10-CM | POA: Insufficient documentation

## 2022-06-25 DIAGNOSIS — M25462 Effusion, left knee: Secondary | ICD-10-CM | POA: Diagnosis not present

## 2022-06-25 DIAGNOSIS — M25552 Pain in left hip: Secondary | ICD-10-CM | POA: Diagnosis not present

## 2022-06-25 DIAGNOSIS — M25562 Pain in left knee: Secondary | ICD-10-CM | POA: Insufficient documentation

## 2022-06-25 DIAGNOSIS — I10 Essential (primary) hypertension: Secondary | ICD-10-CM | POA: Diagnosis not present

## 2022-06-25 DIAGNOSIS — M545 Low back pain, unspecified: Secondary | ICD-10-CM | POA: Diagnosis not present

## 2022-06-25 DIAGNOSIS — M4316 Spondylolisthesis, lumbar region: Secondary | ICD-10-CM | POA: Diagnosis not present

## 2022-06-25 DIAGNOSIS — R002 Palpitations: Secondary | ICD-10-CM | POA: Diagnosis not present

## 2022-06-25 DIAGNOSIS — Z9889 Other specified postprocedural states: Secondary | ICD-10-CM | POA: Diagnosis not present

## 2022-06-25 DIAGNOSIS — M47816 Spondylosis without myelopathy or radiculopathy, lumbar region: Secondary | ICD-10-CM | POA: Diagnosis not present

## 2022-06-25 DIAGNOSIS — S838X2A Sprain of other specified parts of left knee, initial encounter: Secondary | ICD-10-CM | POA: Diagnosis not present

## 2022-06-25 MED ORDER — OXYCODONE-ACETAMINOPHEN 5-325 MG PO TABS: 1.0000 | ORAL_TABLET | Freq: Three times a day (TID) | ORAL | 0 refills | Status: AC | PRN

## 2022-06-25 MED ORDER — OXYCODONE-ACETAMINOPHEN 5-325 MG PO TABS
1.0000 | ORAL_TABLET | Freq: Once | ORAL | Status: AC
  Administered 2022-06-25: 1 via ORAL
  Filled 2022-06-25: qty 1

## 2022-06-25 NOTE — ED Triage Notes (Signed)
Pt s/p fall last night. Pt denies feeling dizzy prior. Pt c/o left back, hip , knee pain. Pt able to ambulate independently. Pt took tylenol 0700

## 2022-06-25 NOTE — Discharge Instructions (Addendum)
-  Fortunately, your CT scans did not show any evidence of fractures.  However, it did show some soft tissue swelling and osteoarthritis in your left knee and your left hip, which may be the source of your pain.  Please utilize your Voltaren gel at home for pain.  If needed, you may take the oxycodone/acetaminophen.  Though use caution when taking this medication, as it may make you dizzy/drowsy.  You may utilize a knee brace for comfort.  -Follow-up with your primary care provider as needed.  -Return to the emergency department anytime if you begin to experience any new or worsening symptoms.

## 2022-06-25 NOTE — ED Provider Notes (Signed)
[facility] Provider Note    Event Date/Time   First MD Initiated Contact with Patient 06/25/22 [phone]     (approximate)   History   Chief Complaint Fall   HPI Wendy Hubbard is a 67 y.o. female, history of hypertension, hyperlipidemia, GAD, type 2 diabetes, GERD, morbid obesity, COPD, depression, presents to the emergency department for evaluation of injury sustained from fall.  She states that she fell onto her left side last night after her "knee gave out".  Since then, she has had persistent pain in her lower back, left-sided hip, and left knee.  She still been able to ambulate well on her own.  Denies fever/chills, chest pain, shortness of breath, abdominal pain, nausea/vomiting, vision changes, hearing changes, cold sensation, paresthesias, rashes, or dizziness/lightheadedness.  History Limitations: No limitations.        Physical Exam  Triage Vital Signs: ED Triage Vitals  Enc Vitals Group     BP 06/25/22 0808 (!) 154/73     Pulse Rate 06/25/22 0808 77     Resp 06/25/22 0808 16     Temp 06/25/22 0808 98.4 F (36.9 C)     Temp Source 06/25/22 0808 Oral     SpO2 06/25/22 0808 98 %     Weight 06/25/22 0801 190 lb (86.2 kg)     Height 06/25/22 0801 5' 4"$  (1.626 m)     Head Circumference --      Peak Flow --      Pain Score 06/25/22 0801 10     Pain Loc --      Pain Edu? --      Excl. in [place]? --     Most recent vital signs: Vitals:   06/25/22 0808  BP: (!) 154/73  Pulse: 77  Resp: 16  Temp: 98.4 F (36.9 C)  SpO2: 98%    General: Awake, NAD.  Skin: Warm, dry. No rashes or lesions.  Eyes: PERRL. Conjunctivae normal.  CV: Good peripheral perfusion.  Resp: Normal effort.  Abd: Soft, non-tender. No distention.  Neuro: At baseline. No gross neurological deficits.  Musculoskeletal: Normal ROM of all extremities.  Focused Exam: Midline spinal tenderness in the lumbar region.  No crepitus.  Left knee exam shows some mild tenderness along  the joint line, otherwise no abnormalities.  Negative anterior/posterior drawer.  No pain with valgus/varus maneuvering PMS intact distally.  Mild tenderness in the left hip.  Physical Exam    ED Results / Procedures / Treatments  Labs (all labs ordered are listed, but only abnormal results are displayed) Labs Reviewed - No data to display   EKG N/A.    RADIOLOGY  ED Provider Interpretation: I personally viewed and interpreted these images.  No signs of acute fractures on CT hip, CT knee, or CT lumbar spine.  X-rays negative as well.  CT Hip Left Wo Contrast  Result Date: 06/25/2022 CLINICAL DATA:  Hip trauma, fracture suspected, xray done EXAM: CT OF THE LEFT HIP WITHOUT CONTRAST TECHNIQUE: Multidetector CT imaging of the left hip was performed according to the standard protocol. Multiplanar CT image reconstructions were also generated. RADIATION DOSE REDUCTION: This exam was performed according to the departmental dose-optimization program which includes automated exposure control, adjustment of the mA and/or kV according to patient size and/or use of iterative reconstruction technique. COMPARISON:  Radiograph 06/25/2022 FINDINGS: Bones/Joint/Cartilage No evidence of acute fracture. Alignment is normal. There is mild-to-moderate osteoarthritis of the left hip. Mild degenerative changes of the left  SI joint. Ligaments Suboptimally assessed by CT. Muscles and Tendons No acute myotendinous abnormality by CT. Soft tissues There is soft tissue swelling or bursitis along the surface of the greater trochanter. Sigmoid diverticulosis. There are a few surgical clips noted in the pelvis. IMPRESSION: No evidence of acute left hip fracture. 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 Mild soft tissue swelling along the greater trochanter or trochanteric bursitis. Electronically Signed   By: Maurine Simmering M.D.   On: 06/25/2022 09:23   CT Knee Left Wo Contrast  Result Date: 06/25/2022 CLINICAL DATA:  Knee  trauma, occult fracture suspected, xray done EXAM: CT OF THE LEFT KNEE WITHOUT CONTRAST TECHNIQUE: Multidetector CT imaging of the left knee was performed according to the standard protocol. Multiplanar CT image reconstructions were also generated. RADIATION DOSE REDUCTION: This exam was performed according to the departmental dose-optimization program which includes automated exposure control, adjustment of the mA and/or kV according to patient size and/or use of iterative reconstruction technique. COMPARISON:  Radiograph 06/25/2022 FINDINGS: Bones/Joint/Cartilage There is no evidence of acute fracture. There is tricompartment osteophyte formation with moderate medial and severe lateral patellofemoral compartment joint space narrowing, subchondral sclerosis and cystic change. There is moderate proximal tibiofibular joint osteoarthritis. Trace joint fluid. Ligaments Suboptimally assessed by CT. Muscles and Tendons No acute myotendinous abnormality by CT. Soft tissues No focal fluid collection. IMPRESSION: No evidence of acute fracture.  Trace joint effusion. 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 Electronically Signed   By: Maurine Simmering M.D.   On: 06/25/2022 09:17   CT Lumbar Spine Wo Contrast  Result Date: 06/25/2022 CLINICAL DATA:  Back trauma status post fall last night. Left-sided back pain. EXAM: CT LUMBAR SPINE WITHOUT CONTRAST TECHNIQUE: Multidetector CT imaging of the lumbar spine was performed without intravenous contrast administration. Multiplanar CT image reconstructions were also generated. RADIATION DOSE REDUCTION: This exam was performed according to the departmental dose-optimization program which includes automated exposure control, adjustment of the mA and/or kV according to patient size and/or use of iterative reconstruction technique. COMPARISON:  MRI lumbar spine 02/25/2022. FINDINGS: Segmentation: Conventional numbering is assumed with 5  non-rib-bearing, lumbar type vertebral bodies. Alignment: 3 mm retrolisthesis of L3 on L4. Vertebrae: No acute fracture. Degenerative endplate changes at [id number] and L2-3. Normal vertebral body heights. Diffusely demineralized appearance of the bones. Mild degenerative changes of the sacroiliac joints. Paraspinal and other soft tissues: Atherosclerotic calcifications of the abdominal aorta and its branches. Sigmoid diverticulosis. Disc levels: T12-L1: No disc herniation, spinal canal stenosis or neural foraminal narrowing. Mild bilateral facet arthropathy. L1-L2: Small disc bulge and mild bilateral facet arthropathy. No spinal canal stenosis or neural foraminal narrowing. L2-L3: Small disc bulge and mild bilateral facet arthropathy. No spinal canal stenosis or neural foraminal narrowing. L3-L4: Disc bulge and moderate bilateral facet arthropathy without significant spinal canal stenosis. Mild bilateral neural foraminal narrowing, unchanged. L4-L5: Disc bulge and moderate bilateral facet arthropathy without significant spinal canal stenosis. Mild bilateral neural foraminal narrowing, unchanged. L5-S1: No disc herniation, spinal canal stenosis or neural foraminal narrowing. Moderate bilateral facet arthropathy. IMPRESSION: 1. No acute fracture or traumatic malalignment of the lumbar spine. 2. Unchanged lumbar spondylosis without significant spinal canal stenosis or high-grade neural foraminal narrowing. 3. Moderate lower lumbar facet arthropathy and mild bilateral SI joint degeneration. 4.  Aortic Atherosclerosis (ICD10-I70.0). Electronically Signed   By: Emmit Alexanders M.D.   On: 06/25/2022 09:15   DG Hip Unilat With Pelvis 2-3 Views Left  Result Date: 06/25/2022 CLINICAL DATA:  Fall  last night.  Left back, hip and knee pain. EXAM: DG HIP (WITH OR WITHOUT PELVIS) 2-3V LEFT COMPARISON:  Pelvic and left hip radiographs 08/22/2014. FINDINGS: The mineralization and alignment are normal. There is no evidence of acute  fracture or dislocation. No evidence of femoral head osteonecrosis. The hip and sacroiliac joint spaces appear adequately preserved. There is mild lower lumbar spondylosis. Postsurgical changes are present in the pelvis. The soft tissues appear unremarkable. IMPRESSION: No evidence of acute fracture or dislocation. Lower lumbar spondylosis. Electronically Signed   By: Richardean Sale M.D.   On: 06/25/2022 08:28   DG Knee Complete 4 Views Left  Result Date: 06/25/2022 CLINICAL DATA:  fall pain EXAM: LEFT KNEE - COMPLETE 4 VIEW COMPARISON:  08/22/2014 FINDINGS: No evidence of fracture, dislocation, or joint effusion. There is mild tricompartmental joint space narrowing, sclerosis and osteophytes consistent with degenerative joint disease. No evidence of effusion. IMPRESSION: Mild degenerative changes. Electronically Signed   By: Sammie Bench M.D.   On: 06/25/2022 08:26   DG Lumbar Spine 2-3 Views  Result Date: 06/25/2022 CLINICAL DATA:  Fall, back pain EXAM: LUMBAR SPINE - 2-3 VIEW COMPARISON:  None Available. FINDINGS: Five lumbar-type vertebral bodies. Normal lumbar lordosis. No evidence of fracture or dislocation. Vertebral body heights and intervertebral disc spaces are maintained. Very mild degenerative changes at L3-4. Visualized bony pelvis appears intact. Cholecystectomy clips. Surgical clips overlying the pelvis. IMPRESSION: Negative. Electronically Signed   By: Julian Hy M.D.   On: 06/25/2022 08:26    PROCEDURES:  Critical Care performed: N/A.  Procedures    MEDICATIONS ORDERED IN ED: Medications  oxyCODONE-acetaminophen (PERCOCET/ROXICET) 5-325 MG per tablet 1 tablet (1 tablet Oral Given 06/25/22 0902)     IMPRESSION / MDM / ASSESSMENT AND PLAN / ED COURSE  I reviewed the triage vital signs and the nursing notes.                              Differential diagnosis includes, but is not limited to, hip fracture, hip dislocation, knee sprain, osteoarthritis, ACL/PCL  injury, MCL/LCL injury, lumbar spine fracture, lumbar strain.  Assessment/Plan Patient presents with low back pain, left knee pain, and left hip pain following mechanical fall.  She denies any preceding symptoms.  Her x-rays do not show any acute abnormalities.  Given the patient's high level of concern, ordered CT images, which did not show any acute findings, but did show osteoarthritis in the left knee and left hip.  She is currently ambulatory at this time.  Provide her with a knee brace for comfort, as well a brief prescription for oxycodone/acetaminophen to help manage her pain.  Encouraged her to follow-up with her primary care provider as needed.  She was amenable to this.  Will discharge.  Provided the patient with anticipatory guidance, return precautions, and educational material. Encouraged the patient to return to the emergency department at any time if they begin to experience any new or worsening symptoms. Patient expressed understanding and agreed with the plan.   Patient's presentation is most consistent with acute complicated illness / injury requiring diagnostic workup.       FINAL CLINICAL IMPRESSION(S) / ED DIAGNOSES   Final diagnoses:  Fall, initial encounter     Rx / DC Orders   ED Discharge Orders          Ordered    oxyCODONE-acetaminophen (PERCOCET) 5-325 MG tablet  Every 8 hours PRN  06/25/22 0946             Note:  This document was prepared using Dragon voice recognition software and may include unintentional dictation errors.   Teodoro Spray, [place] 06/25/22 1721    Blake Divine, MD 06/26/22 [phone]

## 2022-06-25 NOTE — ED Notes (Signed)
See triage note  States she had a fall last pm   Having pain to back ,left hip and knee  Was able to walk w/o assistance

## 2022-06-26 ENCOUNTER — Ambulatory Visit: Payer: Medicare Other

## 2022-07-01 ENCOUNTER — Telehealth: Payer: Self-pay | Admitting: *Deleted

## 2022-07-01 ENCOUNTER — Other Ambulatory Visit: Payer: Self-pay | Admitting: Nurse Practitioner

## 2022-07-01 DIAGNOSIS — E1165 Type 2 diabetes mellitus with hyperglycemia: Secondary | ICD-10-CM

## 2022-07-01 MED ORDER — OZEMPIC (0.25 OR 0.5 MG/DOSE) 2 MG/3ML ~~LOC~~ SOPN: 0.5000 mg | PEN_INJECTOR | SUBCUTANEOUS | 2 refills | Status: DC

## 2022-07-01 NOTE — Telephone Encounter (Signed)
Pt calling requesting a refill on her [place]. Also to check on her Pain Management referral.  I contacted the office that had closed the referral to see what had happened and she said that they closed the referral as denied/expired due to not being able to contact patient. I tried to contact pt to inform her and phone said call could not be completed. Will continue to call and will send a MyChart message as well.       Semaglutide,0.25 or 0.5MG/DOS, (OZEMPIC, 0.25 OR 0.5 MG/DOSE,) 2 MG/3ML SOPN       [facility] [place], [place] [facility] [place].

## 2022-07-01 NOTE — Telephone Encounter (Signed)
Please let the patient know that I renewed ozempic 0.5 mg weekly. I sent this to [facility], will you also let her know about pain management being unable to get a hold of her. I will make new referral, but she has to be able to be reached.  Thanks so much.   -HB

## 2022-07-15 ENCOUNTER — Ambulatory Visit
Admission: RE | Admit: 2022-07-15 | Discharge: 2022-07-15 | Disposition: A | Discharge status: Home / Self Care | Payer: Medicare Other | Source: Ambulatory Visit | Attending: Nurse Practitioner | Admitting: Nurse Practitioner

## 2022-07-15 DIAGNOSIS — N6323 Unspecified lump in the left breast, lower outer quadrant: Secondary | ICD-10-CM

## 2022-07-15 DIAGNOSIS — R928 Other abnormal and inconclusive findings on diagnostic imaging of breast: Secondary | ICD-10-CM | POA: Diagnosis not present

## 2022-07-15 DIAGNOSIS — N644 Mastodynia: Secondary | ICD-10-CM

## 2022-07-15 DIAGNOSIS — N6489 Other specified disorders of breast: Secondary | ICD-10-CM | POA: Diagnosis not present

## 2022-07-15 NOTE — Progress Notes (Signed)
Have discussed with patient over [place]. Her next u/s will be in November 2023.

## 2022-07-16 ENCOUNTER — Other Ambulatory Visit: Payer: Self-pay

## 2022-07-16 ENCOUNTER — Telehealth: Payer: Self-pay

## 2022-07-16 DIAGNOSIS — G2581 Restless legs syndrome: Secondary | ICD-10-CM

## 2022-07-16 MED ORDER — ROPINIROLE HCL 2 MG PO TABS: ORAL_TABLET | ORAL | 0 refills | Status: DC

## 2022-07-16 NOTE — Telephone Encounter (Signed)
Pt is requesting a refill on: rOPINIRole (REQUIP) 2 MG tablet 90 day supply is cheaper with insurance.  Pharmacy: [place], [place] 04/29/22 ROV 07/22/22

## 2022-07-16 NOTE — Telephone Encounter (Signed)
Rx has been sent to [facility]

## 2022-07-21 NOTE — Progress Notes (Signed)
Benign findings - repeat study in approximately 9 months.

## 2022-07-22 ENCOUNTER — Encounter: Payer: Self-pay | Admitting: Nurse Practitioner

## 2022-07-22 ENCOUNTER — Ambulatory Visit (INDEPENDENT_AMBULATORY_CARE_PROVIDER_SITE_OTHER): Payer: Medicare Other | Admitting: Nurse Practitioner

## 2022-07-22 VITALS — BP 132/81 | HR 72 | Ht 64.0 in | Wt 196.1 lb

## 2022-07-22 DIAGNOSIS — D508 Other iron deficiency anemias: Secondary | ICD-10-CM

## 2022-07-22 DIAGNOSIS — E559 Vitamin D deficiency, unspecified: Secondary | ICD-10-CM

## 2022-07-22 DIAGNOSIS — E782 Mixed hyperlipidemia: Secondary | ICD-10-CM

## 2022-07-22 DIAGNOSIS — E1142 Type 2 diabetes mellitus with diabetic polyneuropathy: Secondary | ICD-10-CM | POA: Diagnosis not present

## 2022-07-22 DIAGNOSIS — I10 Essential (primary) hypertension: Secondary | ICD-10-CM

## 2022-07-22 DIAGNOSIS — R5383 Other fatigue: Secondary | ICD-10-CM | POA: Diagnosis not present

## 2022-07-22 LAB — POCT GLYCOSYLATED HEMOGLOBIN (HGB A1C): HbA1c POC (<> result, manual entry): 5.8 % (ref 4.0–5.6)

## 2022-07-22 LAB — POCT UA - MICROALBUMIN
Creatinine, POC: 50 mg/dL
Microalbumin Ur, POC: 30 mg/L

## 2022-07-22 MED ORDER — SEMAGLUTIDE (1 MG/DOSE) 4 MG/3ML ~~LOC~~ SOPN: 1.0000 mg | PEN_INJECTOR | SUBCUTANEOUS | 3 refills | Status: DC

## 2022-07-22 NOTE — Progress Notes (Signed)
Established patient visit   Patient: Wendy Hubbard   DOB: 13-Nov-1955   67 y.o. Female  MRN: [id number] Visit Date: 07/22/2022   Chief Complaint  Patient presents with   Medical Management of Chronic Issues   Subjective    HPI  Follow up  -DM 2  --HgbA1c 5.8 with abnormal urine microalbumin  -blood in stool intermittently  --needs to call GI provider for further evaluation.  -fatigue  --history of mild iron deficiency anemia  -hypertension  --well managed.  -due for routine, fasting labs      Medications: Outpatient Medications Prior to Visit  Medication Sig   alosetron (LOTRONEX) 1 MG tablet Take 1 tablet (1 mg total) by mouth 2 (two) times daily.   ALPRAZolam (XANAX) 0.5 MG tablet TAKE 1/2 TO 1 TABLET BY MOUTH ONCE DAILYAS NEEDED FOR ACUTE ANXIETY   cilostazol (PLETAL) 100 MG tablet Take 1 tablet (100 mg total) by mouth 2 (two) times daily.   DULoxetine (CYMBALTA) 20 MG capsule TAKE 1 CAPSULE BY MOUTH ONCE DAILY   DULoxetine 40 MG CPEP Take 40 mg by mouth daily.   escitalopram (LEXAPRO) 10 MG tablet TAKE 1 TABLET BY MOUTH ONCE EVERY EVENING   finasteride (PROSCAR) 5 MG tablet Take 1 tablet (5 mg total) by mouth daily.   furosemide (LASIX) 20 MG tablet TAKE 1 TABLET BY MOUTH ONCE DAILY   loperamide (IMODIUM) 2 MG capsule Take 4 mg po once. Repeat with '2mg'$  po after each loose stool. Max dose is 16 mg/day   methocarbamol (ROBAXIN) 750 MG tablet TAKE 1 TABLET BY MOUTH EVERY 8 HOURS AS NEEDED FOR MUSCLE SPASMS   metoprolol succinate (TOPROL-XL) 50 MG 24 hr tablet Take 1 tablet (50 mg total) by mouth daily.   metoprolol tartrate (LOPRESSOR) 100 MG tablet Take 1 tablet (100 mg total) by mouth as directed. Take (1) tablet two hours before your CT scan   mirtazapine (REMERON) 15 MG tablet TAKE 1 TABLET BY MOUTH AT BEDTIME FOR SLEEP   montelukast (SINGULAIR) 10 MG tablet TAKE 1 TABLET BY MOUTH AT BEDTIME   Multiple Vitamins-Minerals (BARIATRIC MULTIVITAMINS/IRON PO) Take 1 tablet by  mouth daily.   pantoprazole (PROTONIX) 40 MG tablet TAKE 1 TABLET BY MOUTH ONCE DAILY   pregabalin (LYRICA) 50 MG capsule TAKE 1 CAPSULE BY MOUTH TWICE DAILY   rOPINIRole (REQUIP) 2 MG tablet TAKE 1 TABLET BY MOUTH AT BEDTIME *DOSE INCREASE*   rosuvastatin (CRESTOR) 10 MG tablet TAKE 1 TABLET BY MOUTH ONCE DAILY   [DISCONTINUED] Semaglutide,0.25 or 0.'5MG'$ /DOS, (OZEMPIC, 0.25 OR 0.5 MG/DOSE,) 2 MG/3ML SOPN Inject 0.5 mg into the skin once a week.   No facility-administered medications prior to visit.    Review of Systems -See HPI     Last CBC Lab Results  Component Value Date   WBC 9.3 08/28/2021   HGB 10.4 (L) 08/28/2021   HCT 32.3 (L) 08/28/2021   MCV 97.6 08/28/2021   MCH 31.4 08/28/2021   RDW 13.0 08/28/2021   PLT 236 [id number]   Last metabolic panel Lab Results  Component Value Date   GLUCOSE 74 05/26/2022   NA 141 05/26/2022   K 4.4 05/26/2022   CL 103 05/26/2022   CO2 25 05/26/2022   BUN 18 05/26/2022   CREATININE 0.86 05/26/2022   EGFR 74 05/26/2022   CALCIUM 9.6 05/26/2022   PROT 7.1 08/12/2021   ALBUMIN 4.2 08/12/2021   LABGLOB 2.2 08/07/2020   AGRATIO 2.0 08/07/2020   BILITOT 0.6 08/12/2021  ALKPHOS 120 08/12/2021   AST 20 08/12/2021   ALT 18 08/12/2021   ANIONGAP 6 08/28/2021   Last lipids Lab Results  Component Value Date   CHOL 174 08/07/2020   HDL 57 08/07/2020   LDLCALC 75 08/07/2020   TRIG 259 (H) 08/07/2020   CHOLHDL 3.1 08/07/2020   Last hemoglobin A1c Lab Results  Component Value Date   HGBA1C 5.8 07/22/2022   Last thyroid functions Lab Results  Component Value Date   TSH 2.080 08/07/2020   T3TOTAL 97 11/07/2018   Last vitamin D Lab Results  Component Value Date   VD25OH 36.5 09/18/2019       Objective     Today's Vitals   07/22/22 0858  BP: 132/81  Pulse: 72  SpO2: 97%  Weight: 196 lb 1.9 oz (89 kg)  Height: '5\' 4"'$  (1.626 m)   Body mass index is 33.66 kg/m.  BP Readings from Last 3 Encounters:  07/22/22  132/81  06/25/22 (Abnormal) 154/73  06/01/22 (Abnormal) 142/82    Wt Readings from Last 3 Encounters:  07/22/22 196 lb 1.9 oz (89 kg)  06/25/22 190 lb (86.2 kg)  05/26/22 195 lb 9.6 oz (88.7 kg)    Physical Exam Vitals and nursing note reviewed.  Constitutional:      Appearance: Normal appearance. She is well-developed.  HENT:     Head: Normocephalic and atraumatic.     Nose: Nose normal.     Mouth/Throat:     Mouth: Mucous membranes are moist.     Pharynx: Oropharynx is clear.  Eyes:     Extraocular Movements: Extraocular movements intact.     Conjunctiva/sclera: Conjunctivae normal.     Pupils: Pupils are equal, round, and reactive to light.  Cardiovascular:     Rate and Rhythm: Normal rate and regular rhythm.     Pulses: Normal pulses.     Heart sounds: Normal heart sounds.  Pulmonary:     Effort: Pulmonary effort is normal.     Breath sounds: Normal breath sounds.  Abdominal:     Palpations: Abdomen is soft.  Musculoskeletal:        General: Normal range of motion.     Cervical back: Normal range of motion and neck supple.  Lymphadenopathy:     Cervical: No cervical adenopathy.  Skin:    General: Skin is warm and dry.     Capillary Refill: Capillary refill takes less than 2 seconds.  Neurological:     General: No focal deficit present.     Mental Status: She is alert and oriented to person, place, and time.  Psychiatric:        Mood and Affect: Mood normal.        Behavior: Behavior normal.        Thought Content: Thought content normal.        Judgment: Judgment normal.     Results for orders placed or performed in visit on 07/22/22  POCT UA - Microalbumin  Result Value Ref Range   Microalbumin Ur, POC 30 mg/L   Creatinine, POC 50 mg/dL   Albumin/Creatinine Ratio, Urine, POC 30-300   POCT glycosylated hemoglobin (Hb A1C)  Result Value Ref Range   Hemoglobin A1C     HbA1c POC (<> result, manual entry) 5.8 4.0 - 5.6 %   HbA1c, POC (prediabetic range)      HbA1c, POC (controlled diabetic range)      Assessment & Plan    1. Type 2 diabetes mellitus with  peripheral neuropathy (HCC) HgbA1c 5.8 today with abnormal urine microalbumin. Increase ozempic to 1 mg weekly. Limit intake of carbohydrates and sugar and increase exercise.  - POCT UA - Microalbumin - POCT glycosylated hemoglobin (Hb A1C) - Semaglutide, 1 MG/DOSE, 4 MG/3ML SOPN; Inject 1 mg as directed once a week.  Dispense: 3 mL; Refill: 3  2. Essential hypertension Stable. Continue bp medication as prescribed  - Lipid panel; Future - Comprehensive metabolic panel; Future - CBC; Future - CBC - Comprehensive metabolic panel - Lipid panel  3. Mixed hyperlipidemia Check fasting lipid panel and adjust dosing of crestor according to goal of LDL < 70.  - Lipid panel; Future - Comprehensive metabolic panel; Future - CBC; Future - CBC - Comprehensive metabolic panel - Lipid panel  4. Other fatigue Check thyroid panel and vitamin d  - TSH + free T4; Future - VITAMIN D 25 Hydroxy (Vit-D Deficiency, Fractures); Future - VITAMIN D 25 Hydroxy (Vit-D Deficiency, Fractures) - TSH + free T4  5. Iron deficiency anemia secondary to inadequate dietary iron intake Check iron panel for furhter evaluation.  - B12 and Folate Panel; Future - Ferritin; Future - Ferritin - B12 and Folate Panel  6. Vitamin D deficiency Check vitamin d level and treat deficiency as indicated.   - VITAMIN D 25 Hydroxy (Vit-D Deficiency, Fractures); Future - VITAMIN D 25 Hydroxy (Vit-D Deficiency, Fractures)    Problem List Items Addressed This Visit       Cardiovascular and Mediastinum   Essential hypertension   Relevant Orders   Lipid panel   Comprehensive metabolic panel   CBC     Endocrine   Type 2 diabetes mellitus with peripheral neuropathy (HCC) - Primary   Relevant Medications   Semaglutide, 1 MG/DOSE, 4 MG/3ML SOPN   Other Relevant Orders   POCT UA - Microalbumin (Completed)   POCT  glycosylated hemoglobin (Hb A1C) (Completed)     Other   Mixed hyperlipidemia   Relevant Orders   Lipid panel   Comprehensive metabolic panel   CBC   Vitamin D deficiency   Relevant Orders   VITAMIN D 25 Hydroxy (Vit-D Deficiency, Fractures)   Other fatigue   Relevant Orders   TSH + free T4   VITAMIN D 25 Hydroxy (Vit-D Deficiency, Fractures)   Iron deficiency anemia secondary to inadequate dietary iron intake   Relevant Orders   B12 and Folate Panel   Ferritin     Return in about 3 months (around 10/22/2022) for diabetes with HgbA1c check. needo be set up for [facility] when possible. s t.         Ronnell Freshwater, NP  [facility] [phone] (phone) [phone] (fax)  [place]

## 2022-07-23 DIAGNOSIS — M2352 Chronic instability of knee, left knee: Secondary | ICD-10-CM | POA: Diagnosis not present

## 2022-07-23 LAB — COMPREHENSIVE METABOLIC PANEL
ALT: 12 IU/L (ref 0–32)
AST: 13 IU/L (ref 0–40)
Albumin/Globulin Ratio: 2.1 (ref 1.2–2.2)
Albumin: 4.4 g/dL (ref 3.9–4.9)
Alkaline Phosphatase: 174 IU/L — ABNORMAL HIGH (ref 44–121)
BUN/Creatinine Ratio: 15 (ref 12–28)
BUN: 13 mg/dL (ref 8–27)
Bilirubin Total: 0.4 mg/dL (ref 0.0–1.2)
CO2: 25 mmol/L (ref 20–29)
Calcium: 9.4 mg/dL (ref 8.7–10.3)
Chloride: 103 mmol/L (ref 96–106)
Creatinine, Ser: 0.88 mg/dL (ref 0.57–1.00)
Globulin, Total: 2.1 g/dL (ref 1.5–4.5)
Glucose: 81 mg/dL (ref 70–99)
Potassium: 4.5 mmol/L (ref 3.5–5.2)
Sodium: 142 mmol/L (ref 134–144)
Total Protein: 6.5 g/dL (ref 6.0–8.5)
eGFR: 72 mL/min/{1.73_m2} (ref >59)

## 2022-07-23 LAB — CBC
Hematocrit: 43.7 % (ref 34.0–46.6)
Hemoglobin: 14.8 g/dL (ref 11.1–15.9)
MCH: 31.2 pg (ref 26.6–33.0)
MCHC: 33.9 g/dL (ref 31.5–35.7)
MCV: 92 fL (ref 79–97)
Platelets: 347 10*3/uL (ref 150–450)
RBC: 4.74 x10E6/uL (ref 3.77–5.28)
RDW: 12.2 % (ref 11.7–15.4)
WBC: 9 10*3/uL (ref 3.4–10.8)

## 2022-07-23 LAB — VITAMIN D 25 HYDROXY (VIT D DEFICIENCY, FRACTURES): Vit D, 25-Hydroxy: 24.4 ng/mL — ABNORMAL LOW (ref 30.0–100.0)

## 2022-07-23 LAB — LIPID PANEL
Chol/HDL Ratio: 3.7 ratio (ref 0.0–4.4)
Cholesterol, Total: 213 mg/dL — ABNORMAL HIGH (ref 100–199)
HDL: 58 mg/dL (ref >39)
LDL Chol Calc (NIH): 113 mg/dL — ABNORMAL HIGH (ref 0–99)
Triglycerides: 242 mg/dL — ABNORMAL HIGH (ref 0–149)
VLDL Cholesterol Cal: 42 mg/dL — ABNORMAL HIGH (ref 5–40)

## 2022-07-23 LAB — TSH+FREE T4
Free T4: 1.15 ng/dL (ref 0.82–1.77)
TSH: 1.14 u[IU]/mL (ref 0.450–4.500)

## 2022-07-23 LAB — B12 AND FOLATE PANEL
Folate: 19.1 ng/mL (ref >3.0)
Vitamin B-12: >2000 pg/mL — ABNORMAL HIGH (ref 232–1245)

## 2022-07-23 LAB — FERRITIN: Ferritin: 51 ng/mL (ref 15–150)

## 2022-08-03 ENCOUNTER — Ambulatory Visit: Payer: Medicare Other | Attending: Cardiology

## 2022-08-03 DIAGNOSIS — R002 Palpitations: Secondary | ICD-10-CM

## 2022-08-03 DIAGNOSIS — R072 Precordial pain: Secondary | ICD-10-CM

## 2022-08-03 LAB — ECHOCARDIOGRAM COMPLETE
AR max vel: 2.64 cm2
AV Area VTI: 2.67 cm2
AV Area mean vel: 2.49 cm2
AV Mean grad: 2 mmHg
AV Peak grad: 3.4 mmHg
Ao pk vel: 0.92 m/s
Area-P 1/2: 3.27 cm2
S' Lateral: 2 cm

## 2022-08-04 DIAGNOSIS — Z79899 Other long term (current) drug therapy: Secondary | ICD-10-CM | POA: Insufficient documentation

## 2022-08-04 DIAGNOSIS — Z789 Other specified health status: Secondary | ICD-10-CM | POA: Insufficient documentation

## 2022-08-04 DIAGNOSIS — M899 Disorder of bone, unspecified: Secondary | ICD-10-CM | POA: Insufficient documentation

## 2022-08-04 DIAGNOSIS — G894 Chronic pain syndrome: Secondary | ICD-10-CM | POA: Insufficient documentation

## 2022-08-04 DIAGNOSIS — Z01818 Encounter for other preprocedural examination: Secondary | ICD-10-CM | POA: Insufficient documentation
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
## 2022-08-04 NOTE — Patient Instructions (Incomplete)
____________________________________________________________________________________________  New Patients  Welcome to [facility] [place].   Initial Visit The first or initial visit consists of an evaluation only.   Interventional pain management.  We offer therapies other than opioid controlled substances to manage chronic pain. These include, but are not limited to, diagnostic, therapeutic, and palliative specialized injection therapies (i.e.: Epidural Steroids, Facet Blocks, etc.). We specialize in a variety of nerve blocks as well as radiofrequency treatments. We offer pain implant evaluations and trials, as well as follow up management. In addition we also provide a variety joint injections, including Viscosupplementation (AKA: Gel Therapy).  Prescription Pain Medication. We specialize in alternatives to opioids. We can provide evaluations and recommendations for/of pharmacologic therapies based on CDC Guidelines.  We no longer take patients for [place]-term medication management. We will not be taking over your pain medications.  ____________________________________________________________________________________________  Capsaicin Patches What is this medication? CAPSAICIN (cap SAY sin) relieves minor pain in your muscles and joints. It works by making your skin feel warm or cool, which blocks pain signals going to the brain. This medicine may be used for other purposes; ask your health care provider or pharmacist if you have questions. COMMON BRAND NAME(S): Qutenza What should I tell my care team before I take this medication? They need to know if you have any of these conditions: Broken or irritated skin High blood pressure History of heart attack or stroke An unusual or allergic reaction to capsaicin, hot peppers, other medications, foods, dyes, or preservatives Pregnant or trying to get pregnant Breast-feeding How  should I use this medication? This medication is for external use only. It is applied by your care team in a hospital or clinic setting. Talk to your care team about the use of this medication in children. Special care may be needed. Overdosage: If you think you have taken too much of this medicine contact a poison control center or emergency room at once. NOTE: This medicine is only for you. Do not share this medicine with others. What if I miss a dose? This does not apply. What may interact with this medication? Interactions are not expected. Do not use any other skin products on the affected area without asking your care team. This list may not describe all possible interactions. Give your health care provider a list of all the medicines, herbs, non-prescription drugs, or dietary supplements you use. Also tell them if you smoke, drink alcohol, or use illegal drugs. Some items may interact with your medicine. What should I watch for while using this medication? Your condition will be monitored carefully while you are receiving this medication. Your blood pressure may go up during the procedure. Do not touch the medication patch during treatment. This medication causes red, burning skin. You may need pain medication for during and after the procedure. This medication can make you more sensitive to heat for a few days after treatment. Be careful in hot showers or baths. Keep out of the sun. Exercise may make the treated skin feel hotter. Tell your care team if your symptoms do not start to get better or if they get worse. What side effects may I notice from receiving this medication? Side effects that you should report to your care team as soon as possible: Allergic reactions--skin rash, itching, hives, swelling of the face, lips, tongue, or throat Side effects that usually do not require medical attention (report these to your care team if they continue or are bothersome): Mild  skin irritation,  redness, or dryness This list may not describe all possible side effects. Call your doctor for medical advice about side effects. You may report side effects to FDA at 1-800-FDA-1088. Where should I keep my medication? This medication is given in a hospital or clinic. It will not be stored at home. NOTE: This sheet is a summary. It may not cover all possible information. If you have questions about this medicine, talk to your doctor, pharmacist, or health care provider.  2023 Elsevier/Gold Standard (2020-12-25 00:00:00)  ____________________________________________________________________________________________  Patient Information update  To: All of our patients.  Re: Name change.  It has been made official that our current name, "[facility]"   will soon be changed to "[facility]".   The purpose of this change is to eliminate any confusion created by the concept of our practice being a "Medication Management Pain Clinic". In the past this has led to the misconception that we treat pain primarily by the use of prescription medications.  Nothing can be farther from the truth.   Understanding PAIN MANAGEMENT: To further understand what our practice does, you first have to understand that "Pain Management" is a subspecialty that requires additional training once a physician has completed their specialty training, which can be in either Anesthesia, Neurology, Psychiatry, or Physical Medicine and Rehabilitation (PMR). Each one of these contributes to the final approach taken by each physician to the management of their patient's pain. To be a "Pain Management Specialist" you must have first completed one of the specialty trainings below.  Anesthesiologists - trained in clinical pharmacology and interventional techniques such as nerve blockade and regional as well as central neuroanatomy.  They are trained to block pain before, during, and after surgical interventions.  Neurologists - trained in the diagnosis and pharmacological treatment of complex neurological conditions, such as Multiple Sclerosis, Parkinson's, spinal cord injuries, and other systemic conditions that may be associated with symptoms that may include but are not limited to pain. They tend to rely primarily on the treatment of chronic pain using prescription medications.  Psychiatrist - trained in conditions affecting the psychosocial wellbeing of patients including but not limited to depression, anxiety, schizophrenia, personality disorders, addiction, and other substance use disorders that may be associated with chronic pain. They tend to rely primarily on the treatment of chronic pain using prescription medications.   Physical Medicine and Rehabilitation (PMR) physicians, also known as physiatrists - trained to treat a wide variety of medical conditions affecting the brain, spinal cord, nerves, bones, joints, ligaments, muscles, and tendons. Their training is primarily aimed at treating patients that have suffered injuries that have caused severe physical impairment. Their training is primarily aimed at the physical therapy and rehabilitation of those patients. They may also work alongside orthopedic surgeons or neurosurgeons using their expertise in assisting surgical patients to recover after their surgeries.  INTERVENTIONAL PAIN MANAGEMENT is sub-subspecialty of Pain Management.  Our physicians are Board-certified in Anesthesia, Pain Management, and Interventional Pain Management.  This meaning that not only have they been trained and Board-certified in their specialty of Anesthesia, and subspecialty of Pain Management, but they have also received further training in the sub-subspecialty of Interventional Pain Management, in order to become Board-certified as INTERVENTIONAL PAIN MANAGEMENT SPECIALIST.    Mission: Our  goal is to use our skills in  [place] as alternatives to the chronic use of prescription opioid medications for the  treatment of pain. To make this more clear, we have changed our name to reflect what we do and offer. We will continue to offer medication management assessment and recommendations, but we will not be taking over any patient's medication management.  ____________________________________________________________________________________________

## 2022-08-05 ENCOUNTER — Ambulatory Visit: Payer: Medicare Other | Attending: Pain Medicine | Admitting: Pain Medicine

## 2022-08-05 ENCOUNTER — Encounter: Payer: Self-pay | Admitting: Pain Medicine

## 2022-08-05 VITALS — BP 158/79 | HR 72 | Temp 97.2°F | Resp 18 | Ht 64.0 in | Wt 196.0 lb

## 2022-08-05 DIAGNOSIS — M25511 Pain in right shoulder: Secondary | ICD-10-CM | POA: Diagnosis not present

## 2022-08-05 DIAGNOSIS — E559 Vitamin D deficiency, unspecified: Secondary | ICD-10-CM

## 2022-08-05 DIAGNOSIS — M792 Neuralgia and neuritis, unspecified: Secondary | ICD-10-CM

## 2022-08-05 DIAGNOSIS — S6991XA Unspecified injury of right wrist, hand and finger(s), initial encounter: Secondary | ICD-10-CM | POA: Insufficient documentation

## 2022-08-05 DIAGNOSIS — M542 Cervicalgia: Secondary | ICD-10-CM

## 2022-08-05 DIAGNOSIS — G894 Chronic pain syndrome: Secondary | ICD-10-CM

## 2022-08-05 DIAGNOSIS — E538 Deficiency of other specified B group vitamins: Secondary | ICD-10-CM | POA: Diagnosis not present

## 2022-08-05 DIAGNOSIS — M25512 Pain in left shoulder: Secondary | ICD-10-CM

## 2022-08-05 DIAGNOSIS — M899 Disorder of bone, unspecified: Secondary | ICD-10-CM | POA: Diagnosis not present

## 2022-08-05 DIAGNOSIS — M545 Low back pain, unspecified: Secondary | ICD-10-CM

## 2022-08-05 DIAGNOSIS — Z789 Other specified health status: Secondary | ICD-10-CM | POA: Insufficient documentation

## 2022-08-05 DIAGNOSIS — E1142 Type 2 diabetes mellitus with diabetic polyneuropathy: Secondary | ICD-10-CM

## 2022-08-05 DIAGNOSIS — Z79899 Other long term (current) drug therapy: Secondary | ICD-10-CM | POA: Diagnosis not present

## 2022-08-05 DIAGNOSIS — M25562 Pain in left knee: Secondary | ICD-10-CM | POA: Diagnosis not present

## 2022-08-05 DIAGNOSIS — R94130 Abnormal response to nerve stimulation, unspecified: Secondary | ICD-10-CM | POA: Diagnosis not present

## 2022-08-05 DIAGNOSIS — S6991XS Unspecified injury of right wrist, hand and finger(s), sequela: Secondary | ICD-10-CM | POA: Diagnosis not present

## 2022-08-05 DIAGNOSIS — R519 Headache, unspecified: Secondary | ICD-10-CM | POA: Diagnosis not present

## 2022-08-05 DIAGNOSIS — M79602 Pain in left arm: Secondary | ICD-10-CM | POA: Diagnosis not present

## 2022-08-05 DIAGNOSIS — G4486 Cervicogenic headache: Secondary | ICD-10-CM | POA: Insufficient documentation

## 2022-08-05 DIAGNOSIS — R937 Abnormal findings on diagnostic imaging of other parts of musculoskeletal system: Secondary | ICD-10-CM | POA: Diagnosis not present

## 2022-08-05 DIAGNOSIS — M2352 Chronic instability of knee, left knee: Secondary | ICD-10-CM | POA: Diagnosis not present

## 2022-08-05 DIAGNOSIS — E1140 Type 2 diabetes mellitus with diabetic neuropathy, unspecified: Secondary | ICD-10-CM | POA: Insufficient documentation

## 2022-08-05 DIAGNOSIS — G8929 Other chronic pain: Secondary | ICD-10-CM

## 2022-08-05 NOTE — Progress Notes (Signed)
Safety precautions to be maintained throughout the outpatient stay will include: orient to surroundings, keep bed in low position, maintain call bell within reach at all times, provide assistance with transfer out of bed and ambulation.  

## 2022-08-10 DIAGNOSIS — E1140 Type 2 diabetes mellitus with diabetic neuropathy, unspecified: Secondary | ICD-10-CM | POA: Insufficient documentation

## 2022-08-10 LAB — COMPLIANCE DRUG ANALYSIS, UR

## 2022-08-10 LAB — C-REACTIVE PROTEIN: CRP: 3 mg/L (ref 0–10)

## 2022-08-10 LAB — VITAMIN E
Vitamin E (Alpha Tocopherol): 15.5 mg/L (ref 9.0–29.0)
Vitamin E(Gamma Tocopherol): 1.1 mg/L (ref 0.5–4.9)

## 2022-08-10 LAB — SEDIMENTATION RATE: Sed Rate: 6 mm/hr (ref 0–40)

## 2022-08-10 NOTE — Progress Notes (Unsigned)
PROVIDER NOTE: Interpretation of information contained herein should be left to medically-trained personnel. Specific patient instructions are provided elsewhere under "Patient Instructions" section of medical record. This document was created in part using STT-dictation technology, any transcriptional errors that may result from this process are unintentional.  Patient: Wendy Hubbard Type: Established DOB: March 11, 1956 MRN: [id number] PCP: Ronnell Freshwater, NP  Service: Procedure DOS: 08/11/2022 Setting: Ambulatory Location: Ambulatory outpatient facility Delivery: Face-to-face Provider: Gaspar Cola, MD Specialty: Interventional Pain Management Specialty designation: 09 Location: Outpatient facility Ref. Prov.: Ronnell Freshwater, NP       Interventional Therapy   Interventional Treatment:           Type: Qutenza Neurolysis #1  Laterality:  Bilateral Area treated: Feet Imaging Guidance: None Anesthesia/analgesia/anxiolysis/sedation: None required Medication (Right): Qutenza (capsaicin 8%) topical system Medication (Left): Qutenza (capsaicin 8%) topical system Date: 08/11/2022 Performed by: Gaspar Cola, MD Rationale (medical necessity): procedure needed and proper for the treatment of [name]'s medical symptoms and needs. Indication: Painful diabetic peripheral neuralgia (DPN) (ICD-10-CM:E11.40) severe enough to impact quality of life or function. 1. Chronic feet pain (1ry area of Pain) (Bilateral)   2. Chronic painful diabetic neuropathy   3. Diabetic peripheral neuropathy   4. Abnormal NCS (nerve conduction studies) (06/14/2020)    NAS-11 Pain score:   Pre-procedure: 3 /10   Post-procedure: 0-No pain/10     Position / Prep / Materials:  Position: Supine  Materials: Qutenza Kit  Pre-op H&P Assessment:  Wendy Hubbard is a 67 y.o. (year old), female patient, seen today for interventional treatment. She  has a past surgical history that includes Tonsillectomy; Abdominal  hysterectomy; Breast surgery; Cholecystectomy; Appendectomy; Ankle arthroscopy (Bilateral); Knee arthroscopy (Left); Bladder suspension; Esophagogastroduodenoscopy (N/A, 11/02/2015); Colonoscopy (N/A, 11/04/2015); Reduction mammaplasty (Bilateral, 1988); Breast excisional biopsy (Left, 1990); Breast excisional biopsy (Right, 1989); Colonoscopy with propofol (N/A, 08/30/2020); polypectomy (08/30/2020); Gastrojejunostomy (N/A, 08/25/2021); Partial gastrectomy (N/A, 08/25/2021); Upper gi endoscopy (08/25/2021); and Esophagogastroduodenoscopy (egd) with propofol (N/A, 03/16/2022). Wendy Hubbard has a current medication list which includes the following prescription(s): alosetron, alprazolam, cilostazol, duloxetine, duloxetine hcl, escitalopram, furosemide, loperamide, methocarbamol, metoprolol succinate, mirtazapine, montelukast, multiple vitamins-minerals, oxycodone-acetaminophen, pantoprazole, pregabalin, ropinirole, rosuvastatin, and semaglutide (1 mg/dose). Her primarily concern today is the feet pain  Initial Vital Signs:  Pulse/HCG Rate: 95  Temp: (!) 97.3 F (36.3 C) Resp: 18 BP: 136/63 SpO2: 96 %  BMI: Estimated body mass index is 33.64 kg/m as calculated from the following:   Height as of this encounter: 5\' 4"  (1.626 m).   Weight as of this encounter: 196 lb (88.9 kg).  Risk Assessment: Allergies: Reviewed. She is allergic to flagyl [metronidazole], penicillins, and doxycycline.  Allergy Precautions: None required Coagulopathies: Reviewed. None identified.  Blood-thinner therapy: None at this time Active Infection(s): Reviewed. None identified. Wendy Hubbard is afebrile  Site Confirmation: [name] was asked to confirm the procedure and laterality before marking the site Procedure checklist: Completed Consent: Before the procedure and under the influence of no sedative(s), amnesic(s), or anxiolytics, the patient was informed of the treatment options, risks and possible complications. To fulfill our  ethical and legal obligations, as recommended by the American Medical Association's Code of Ethics, I have informed the patient of my clinical impression; the nature and purpose of the treatment or procedure; the risks, benefits, and possible complications of the intervention; the alternatives, including doing nothing; the risk(s) and benefit(s) of the alternative treatment(s) or procedure(s); and the risk(s) and benefit(s) of doing nothing. The  patient was provided information about the general risks and possible complications associated with the procedure. These may include, but are not limited to: failure to achieve desired goals, infection, bleeding, organ or nerve damage, allergic reactions, paralysis, and death. In addition, the patient was informed of those risks and complications associated to the procedure, such as failure to decrease pain; infection; bleeding; organ or nerve damage with subsequent damage to sensory, motor, and/or autonomic systems, resulting in permanent pain, numbness, and/or weakness of one or several areas of the body; allergic reactions; (i.e.: anaphylactic reaction); and/or death. Furthermore, the patient was informed of those risks and complications associated with the medications. These include, but are not limited to: allergic reactions (i.e.: anaphylactic or anaphylactoid reaction(s)); adrenal axis suppression; blood sugar elevation that in diabetics may result in ketoacidosis or comma; water retention that in patients with history of congestive heart failure may result in shortness of breath, pulmonary edema, and decompensation with resultant heart failure; weight gain; swelling or edema; medication-induced neural toxicity; particulate matter embolism and blood vessel occlusion with resultant organ, and/or nervous system infarction; and/or aseptic necrosis of one or more joints. Finally, the patient was informed that Medicine is not an exact science; therefore, there is also  the possibility of unforeseen or unpredictable risks and/or possible complications that may result in a catastrophic outcome. The patient indicated having understood very clearly. We have given the patient no guarantees and we have made no promises. Enough time was given to the patient to ask questions, all of which were answered to the patient's satisfaction. Ms. Hollenbach has indicated that she wanted to continue with the procedure. Attestation: I, the ordering provider, attest that I have discussed with the patient the benefits, risks, side-effects, alternatives, likelihood of achieving goals, and potential problems during recovery for the procedure that I have provided informed consent. Date  Time: 08/11/2022 11:09 AM  Pre-Procedure Preparation:  Monitoring: As per clinic protocol. Respiration, ETCO2, SpO2, BP, heart rate and rhythm monitor placed and checked for adequate function Safety Precautions: Patient was assessed for positional comfort and pressure points before starting the procedure. Time-out: I initiated and conducted the "Time-out" before starting the procedure, as per protocol. The patient was asked to participate by confirming the accuracy of the "Time Out" information. Verification of the correct person, site, and procedure were performed and confirmed by me, the nursing staff, and the patient. "Time-out" conducted as per Joint Commission's Universal Protocol (UP.01.01.01). Time: 1125 Start Time: 1132 hrs.  Description/Narrative of Procedure:          Region: Distal lower extremity Target Area: Sensory peripheral nerves affected by diabetic peripheral neuropathy Site: Feet Approach: Percutaneous  No./Series: Not applicable  Type: Percutaneous  Purpose: Therapeutic  Region: Distal lower extremities  Start Time: [id number] hrs.  Description of the Procedure: Protocol guidelines were followed. The patient was assisted into a comfortable position.  Informed consent was obtained in the  patient monitored in the usual manner.  All questions were answered prior to the procedure.  They Qutenza patches were applied to the affected area and then covered with the wrap.  The Patient was kept under observation until the treatment was completed.  The patches were removed and the treated area was inspected.  Vitals:   08/11/22 1104  BP: 136/63  Pulse: 95  Resp: 18  Temp: (!) 97.3 F (36.3 C)  TempSrc: Temporal  SpO2: 96%  Weight: 196 lb (88.9 kg)  Height: 5\' 4"  (1.626 m)     End  Time: 1217 hrs.  Imaging Guidance:          Type of Imaging Technique: None used Indication(s): N/A Exposure Time: No patient exposure Contrast: None used. Fluoroscopic Guidance: N/A Ultrasound Guidance: N/A Interpretation: N/A  Post-operative Assessment:  Post-procedure Vital Signs:  Pulse/HCG Rate: 95  Temp: (!) 97.3 F (36.3 C) Resp: 18 BP: 136/63 SpO2: 96 %  EBL: None  Complications: No immediate post-treatment complications observed by team, or reported by patient.  Note: The patient tolerated the entire procedure well. A repeat set of vitals were taken after the procedure and the patient was kept under observation following institutional policy, for this type of procedure. Post-procedural neurological assessment was performed, showing return to baseline, prior to discharge. The patient was provided with post-procedure discharge instructions, including a section on how to identify potential problems. Should any problems arise concerning this procedure, the patient was given instructions to immediately contact us, at any time, without hesitation. In any case, we plan to contact the patient by telephone for a follow-up status report regarding this interventional procedure.  Comments:  No additional relevant information.  Plan of Care (POC)  Orders:  Orders Placed This Encounter  Procedures   NEUROLYSIS    Please order Qutenza patches from pharmacy    Standing Status:   Future     Standing Expiration Date:   11/09/2022    Order Specific Question:   Where will this procedure be performed?    Answer:   [facility]   Informed Consent Details: Physician/Practitioner Attestation; Transcribe to consent form and obtain patient signature    Nursing Order: Transcribe to consent form and obtain patient signature.    Order Specific Question:   Physician/Practitioner attestation of informed consent for procedure/surgical case    Answer:   I, the physician/practitioner, attest that I have discussed with the patient the benefits, risks, side effects, alternatives, likelihood of achieving goals and potential problems during recovery for the procedure that I have provided informed consent.    Order Specific Question:   Procedure    Answer:   Qutenza peripheral neurolysis    Order Specific Question:   Physician/Practitioner performing the procedure    Answer:   Milinda Pointer, MD    Order Specific Question:   Indication/Reason    Answer:   Painful Diabetic Peripheral Neuropathy   Chronic Opioid Analgesic:  oxycodone/APAP 5/325, 1 tab p.o. 4 times daily (# 20) (last filled on 06/25/2022) MME/day: 30 mg/day   Medications ordered for procedure: Meds ordered this encounter  Medications   capsaicin topical system 8 % patch 1 patch   Medications administered: We administered capsaicin topical system.  See the medical record for exact dosing, route, and time of administration.  Follow-up plan:   Return in about 6 weeks (around 09/22/2022) for Proc-day (T,Th), (Face2F), (PPE).       Interventional Therapies  Risk Factors  Considerations:     Planned  Pending:   See above for possible orders   Under consideration:   Therapeutic bilateral Qutenza treatment #1    Completed:   None at this time   Completed by other providers:   None reported   Therapeutic  Palliative (PRN) options:   None established       Recent Visits Date Type Provider Dept  08/05/22  Office Visit Milinda Pointer, MD [facility]  Showing recent visits within past 90 days and meeting all other requirements Today's Visits Date Type Provider Dept  08/11/22 Procedure visit  Milinda Pointer, MD [facility]  Showing today's visits and meeting all other requirements Future Appointments Date Type Provider Dept  10/08/22 Appointment Milinda Pointer, MD [facility]  Showing future appointments within next 90 days and meeting all other requirements  Disposition: Discharge home  Discharge (Date  Time): 08/11/2022; 1238 hrs.   Primary Care Physician: Ronnell Freshwater, NP Location: [facility] Note by: Gaspar Cola, MD (TTS technology used. I apologize for any typographical errors that were not detected and corrected.) Date: 08/11/2022; Time: 3:58 PM  Disclaimer:  Medicine is not an Chief Strategy Officer. The only guarantee in medicine is that nothing is guaranteed. It is important to note that the decision to proceed with this intervention was based on the information collected from the patient. The Data and conclusions were drawn from the patient's questionnaire, the interview, and the physical examination. Because the information was provided in large part by the patient, it cannot be guaranteed that it has not been purposely or unconsciously manipulated. Every effort has been made to obtain as much relevant data as possible for this evaluation. It is important to note that the conclusions that lead to this procedure are derived in large part from the available data. Always take into account that the treatment will also be dependent on availability of resources and existing treatment guidelines, considered by other Pain Management Practitioners as being common knowledge and practice, at the time of the intervention. For Medico-Legal purposes, it is also important to point out that variation in procedural techniques and  pharmacological choices are the acceptable norm. The indications, contraindications, technique, and results of the above procedure should only be interpreted and judged by a Board-Certified Interventional Pain Specialist with extensive familiarity and expertise in the same exact procedure and technique.

## 2022-08-11 ENCOUNTER — Encounter: Payer: Self-pay | Admitting: Pain Medicine

## 2022-08-11 ENCOUNTER — Ambulatory Visit: Payer: Medicare Other | Attending: Pain Medicine | Admitting: Pain Medicine

## 2022-08-11 VITALS — BP 136/63 | HR 95 | Temp 97.3°F | Resp 18 | Ht 64.0 in | Wt 196.0 lb

## 2022-08-11 DIAGNOSIS — R94130 Abnormal response to nerve stimulation, unspecified: Secondary | ICD-10-CM

## 2022-08-11 DIAGNOSIS — G8929 Other chronic pain: Secondary | ICD-10-CM | POA: Insufficient documentation

## 2022-08-11 DIAGNOSIS — M79671 Pain in right foot: Secondary | ICD-10-CM | POA: Insufficient documentation

## 2022-08-11 DIAGNOSIS — M79672 Pain in left foot: Secondary | ICD-10-CM | POA: Insufficient documentation

## 2022-08-11 DIAGNOSIS — E1142 Type 2 diabetes mellitus with diabetic polyneuropathy: Secondary | ICD-10-CM | POA: Diagnosis not present

## 2022-08-11 DIAGNOSIS — E1140 Type 2 diabetes mellitus with diabetic neuropathy, unspecified: Secondary | ICD-10-CM | POA: Diagnosis not present

## 2022-08-11 MED ORDER — CAPSAICIN-CLEANSING GEL 8 % EX KIT
1.0000 | PACK | Freq: Once | CUTANEOUS | Status: AC
  Administered 2022-08-11: 4 via TOPICAL

## 2022-08-11 NOTE — Patient Instructions (Signed)
Capsaicin Patches What is this medication? CAPSAICIN (cap SAY sin) relieves minor pain in your muscles and joints. It works by making your skin feel warm or cool, which blocks pain signals going to the brain. This medicine may be used for other purposes; ask your health care provider or pharmacist if you have questions. COMMON BRAND NAME(S): Qutenza What should I tell my care team before I take this medication? They need to know if you have any of these conditions: Broken or irritated skin High blood pressure History of heart attack or stroke An unusual or allergic reaction to capsaicin, hot peppers, other medications, foods, dyes, or preservatives Pregnant or trying to get pregnant Breast-feeding How should I use this medication? This medication is for external use only. It is applied by your care team in a hospital or clinic setting. Talk to your care team about the use of this medication in children. Special care may be needed. Overdosage: If you think you have taken too much of this medicine contact a poison control center or emergency room at once. NOTE: This medicine is only for you. Do not share this medicine with others. What if I miss a dose? This does not apply. What may interact with this medication? Interactions are not expected. Do not use any other skin products on the affected area without asking your care team. This list may not describe all possible interactions. Give your health care provider a list of all the medicines, herbs, non-prescription drugs, or dietary supplements you use. Also tell them if you smoke, drink alcohol, or use illegal drugs. Some items may interact with your medicine. What should I watch for while using this medication? Your condition will be monitored carefully while you are receiving this medication. Your blood pressure may go up during the procedure. Do not touch the medication patch during treatment. This medication causes red, burning skin. You  may need pain medication for during and after the procedure. This medication can make you more sensitive to heat for a few days after treatment. Be careful in hot showers or baths. Keep out of the sun. Exercise may make the treated skin feel hotter. Tell your care team if your symptoms do not start to get better or if they get worse. What side effects may I notice from receiving this medication? Side effects that you should report to your care team as soon as possible: Allergic reactions--skin rash, itching, hives, swelling of the face, lips, tongue, or throat Side effects that usually do not require medical attention (report these to your care team if they continue or are bothersome): Mild skin irritation, redness, or dryness This list may not describe all possible side effects. Call your doctor for medical advice about side effects. You may report side effects to FDA at 1-800-FDA-1088. Where should I keep my medication? This medication is given in a hospital or clinic. It will not be stored at home. NOTE: This sheet is a summary. It may not cover all possible information. If you have questions about this medicine, talk to your doctor, pharmacist, or health care provider.  2023 Elsevier/Gold Standard (2020-12-25 00:00:00)  

## 2022-08-11 NOTE — Progress Notes (Signed)
Safety precautions to be maintained throughout the outpatient stay will include: orient to surroundings, keep bed in low position, maintain call bell within reach at all times, provide assistance with transfer out of bed and ambulation.  

## 2022-08-12 ENCOUNTER — Telehealth: Payer: Self-pay | Admitting: *Deleted

## 2022-08-12 NOTE — Telephone Encounter (Signed)
Attempted to call for post Qutenza application follow-up. Message left.

## 2022-08-14 NOTE — Progress Notes (Signed)
Pt has been made aware of normal result and verbalized understanding.  jw

## 2022-08-31 ENCOUNTER — Other Ambulatory Visit: Payer: Self-pay | Admitting: Nurse Practitioner

## 2022-08-31 DIAGNOSIS — G629 Polyneuropathy, unspecified: Secondary | ICD-10-CM

## 2022-08-31 DIAGNOSIS — M792 Neuralgia and neuritis, unspecified: Secondary | ICD-10-CM

## 2022-09-03 DIAGNOSIS — M2352 Chronic instability of knee, left knee: Secondary | ICD-10-CM | POA: Diagnosis not present

## 2022-09-07 ENCOUNTER — Telehealth: Payer: Self-pay | Admitting: Pharmacist

## 2022-09-07 NOTE — Progress Notes (Signed)
Patient appearing on report for quality metrics.  Outreached patient to discuss medication management. Left voicemail for patient to return my call at their convenience.   Zaraya Delauder, PharmD, BCPS Clinical Pharmacist [facility]  

## 2022-09-08 DIAGNOSIS — M25562 Pain in left knee: Secondary | ICD-10-CM | POA: Diagnosis not present

## 2022-09-08 DIAGNOSIS — G8929 Other chronic pain: Secondary | ICD-10-CM | POA: Diagnosis not present

## 2022-09-10 ENCOUNTER — Encounter: Payer: Self-pay | Admitting: *Deleted

## 2022-09-10 DIAGNOSIS — Z006 Encounter for examination for normal comparison and control in clinical research program: Secondary | ICD-10-CM

## 2022-09-10 NOTE — Research (Signed)
Message left for Ms Vandenberg about V1P. Encouraged her to call with any questions, or if she would like more info.

## 2022-09-16 ENCOUNTER — Telehealth: Payer: Self-pay

## 2022-09-16 ENCOUNTER — Other Ambulatory Visit: Payer: Self-pay | Admitting: Nurse Practitioner

## 2022-09-16 NOTE — Telephone Encounter (Signed)
Called patient to schedule Medicare Annual Wellness Visit (AWV). Left message for patient to call back and schedule Medicare Annual Wellness Visit (AWV).  Last date of AWV: eligible 06/11/22 for AWV-I  Please schedule an appointment at any time On Annual Wellness Visit Schedule.    Agnes Lawrence, CMA (AAMA)  [facility]- AWV Program [phone]

## 2022-09-19 ENCOUNTER — Other Ambulatory Visit: Payer: Self-pay | Admitting: Nurse Practitioner

## 2022-09-19 DIAGNOSIS — F411 Generalized anxiety disorder: Secondary | ICD-10-CM

## 2022-09-24 ENCOUNTER — Telehealth: Payer: Self-pay | Admitting: Cardiology

## 2022-09-24 NOTE — Telephone Encounter (Signed)
Did you add a medication? No  If no, reason? Reason for not adding med: Other Just attempted to increased Crestor previously  Did you remove a medication? No  Did you increase the dosage of any medication? No  Did you decrease the dosage of any medication? No  Did you refer to a specialist (i.e. lipid clinic, preventive cardiology, endocrinology)? No  Has patient seen plaque report? No

## 2022-09-30 ENCOUNTER — Telehealth: Payer: Self-pay

## 2022-09-30 NOTE — Telephone Encounter (Signed)
LVM for pt to call back to schedule a appt for surgical clearance.

## 2022-10-08 ENCOUNTER — Ambulatory Visit: Payer: Medicare Other | Admitting: Pain Medicine

## 2022-10-09 ENCOUNTER — Ambulatory Visit (INDEPENDENT_AMBULATORY_CARE_PROVIDER_SITE_OTHER): Payer: Medicare Other | Admitting: Nurse Practitioner

## 2022-10-09 ENCOUNTER — Encounter: Payer: Self-pay | Admitting: Nurse Practitioner

## 2022-10-09 VITALS — BP 134/74 | HR 75 | Ht 64.0 in | Wt 195.4 lb

## 2022-10-09 DIAGNOSIS — R197 Diarrhea, unspecified: Secondary | ICD-10-CM | POA: Diagnosis not present

## 2022-10-09 DIAGNOSIS — E1142 Type 2 diabetes mellitus with diabetic polyneuropathy: Secondary | ICD-10-CM | POA: Diagnosis not present

## 2022-10-09 DIAGNOSIS — I10 Essential (primary) hypertension: Secondary | ICD-10-CM | POA: Diagnosis not present

## 2022-10-09 DIAGNOSIS — Z794 Long term (current) use of insulin: Secondary | ICD-10-CM | POA: Diagnosis not present

## 2022-10-09 DIAGNOSIS — J3089 Other allergic rhinitis: Secondary | ICD-10-CM

## 2022-10-09 DIAGNOSIS — Z01818 Encounter for other preprocedural examination: Secondary | ICD-10-CM | POA: Diagnosis not present

## 2022-10-09 MED ORDER — LOPERAMIDE HCL 2 MG PO CAPS: ORAL_CAPSULE | ORAL | 1 refills | Status: AC

## 2022-10-09 MED ORDER — DESLORATADINE 5 MG PO TABS: 5.0000 mg | ORAL_TABLET | Freq: Every day | ORAL | 1 refills | Status: DC

## 2022-10-09 NOTE — Progress Notes (Signed)
Established patient visit   Patient: Wendy Hubbard   DOB: [birth year]   67 y.o. Female  MRN: [id number] Visit Date: 10/09/2022   Chief Complaint  Patient presents with   Medical Clearance   Subjective    HPI  Surgical clearance  -having left knee replacement surgery.  -surgery date is pending this surgical clearance.   Blood pressure well controlled  -has seen cardiology.  -Does need new ECG.  -last ECG was NSR done 05/2022.   Blood sugars have been well managed -most recent HgbA1c done 07/2022 was 5.8   -She denies chest pain, chest pressure, or shortness of breath. She denies headaches or visual disturbances. She denies abdominal pain, nausea, vomiting, or changes in bowel or bladder habits.     Medications: Outpatient Medications Prior to Visit  Medication Sig   alosetron (LOTRONEX) 1 MG tablet Take 1 tablet (1 mg total) by mouth 2 (two) times daily.   ALPRAZolam (XANAX) 0.5 MG tablet TAKE 1/2 TO 1 TABLET BY MOUTH ONCE DAILYAS NEEDED FOR ACUTE ANXIETY   cilostazol (PLETAL) 100 MG tablet Take 1 tablet (100 mg total) by mouth 2 (two) times daily.   DULoxetine (CYMBALTA) 20 MG capsule TAKE 1 CAPSULE BY MOUTH ONCE DAILY   escitalopram (LEXAPRO) 10 MG tablet TAKE 1 TABLET BY MOUTH ONCE EVERY EVENING   furosemide (LASIX) 20 MG tablet TAKE 1 TABLET BY MOUTH ONCE DAILY   methocarbamol (ROBAXIN) 750 MG tablet TAKE 1 TABLET BY MOUTH EVERY 8 HOURS AS NEEDED FOR MUSCLE SPASMS   metoprolol succinate (TOPROL-XL) 50 MG 24 hr tablet Take 1 tablet (50 mg total) by mouth daily.   mirtazapine (REMERON) 15 MG tablet TAKE 1 TABLET BY MOUTH AT BEDTIME FOR SLEEP   Multiple Vitamins-Minerals (BARIATRIC MULTIVITAMINS/IRON PO) Take 1 tablet by mouth daily.   pantoprazole (PROTONIX) 40 MG tablet TAKE 1 TABLET BY MOUTH ONCE DAILY   pregabalin (LYRICA) 50 MG capsule TAKE 1 CAPSULE BY MOUTH TWICE DAILY   rOPINIRole (REQUIP) 2 MG tablet TAKE 1 TABLET BY MOUTH AT BEDTIME *DOSE INCREASE*   rosuvastatin  (CRESTOR) 10 MG tablet TAKE 1 TABLET BY MOUTH ONCE DAILY   Semaglutide, 1 MG/DOSE, 4 MG/3ML SOPN Inject 1 mg as directed once a week.   [DISCONTINUED] loperamide (IMODIUM) 2 MG capsule Take 4 mg po once. Repeat with 2mg  po after each loose stool. Max dose is 16 mg/day   montelukast (SINGULAIR) 10 MG tablet TAKE 1 TABLET BY MOUTH AT BEDTIME (Patient not taking: Reported on 10/09/2022)   [DISCONTINUED] oxyCODONE-acetaminophen (PERCOCET/ROXICET) 5-325 MG tablet Take 1 tablet by mouth every 4 (four) hours as needed for severe pain. Last took 08/03/22 - has one pill left   No facility-administered medications prior to visit.    Review of Systems See HPI    Last CBC Lab Results  Component Value Date   WBC 9.0 07/22/2022   HGB 14.8 07/22/2022   HCT 43.7 07/22/2022   MCV 92 07/22/2022   MCH 31.2 07/22/2022   RDW 12.2 07/22/2022   PLT 347 07/22/2022   Last metabolic panel Lab Results  Component Value Date   GLUCOSE 81 07/22/2022   NA 142 07/22/2022   K 4.5 07/22/2022   CL 103 07/22/2022   CO2 25 07/22/2022   BUN 13 07/22/2022   CREATININE 0.88 07/22/2022   EGFR 72 07/22/2022   CALCIUM 9.4 07/22/2022   PROT 6.5 07/22/2022   ALBUMIN 4.4 07/22/2022   LABGLOB 2.1 07/22/2022   AGRATIO 2.1 07/22/2022  BILITOT 0.4 07/22/2022   ALKPHOS 174 (H) 07/22/2022   AST 13 07/22/2022   ALT 12 07/22/2022   ANIONGAP 6 08/28/2021   Last lipids Lab Results  Component Value Date   CHOL 213 (H) 07/22/2022   HDL 58 07/22/2022   LDLCALC 113 (H) 07/22/2022   TRIG 242 (H) 07/22/2022   CHOLHDL 3.7 07/22/2022   Last hemoglobin A1c Lab Results  Component Value Date   HGBA1C 5.8 07/22/2022   Last thyroid functions Lab Results  Component Value Date   TSH 1.140 07/22/2022   T3TOTAL 97 11/07/2018   Last vitamin D Lab Results  Component Value Date   VD25OH 24.4 (L) 07/22/2022       Objective     Today's Vitals   10/09/22 0855  BP: 134/74  Pulse: 75  SpO2: 99%  Weight: 195 lb 6.4 oz  (88.6 kg)  Height: 5\' 4"  (1.626 m)   Body mass index is 33.54 kg/m.  BP Readings from Last 3 Encounters:  10/09/22 134/74  08/11/22 136/63  08/05/22 (Abnormal) 158/79    Wt Readings from Last 3 Encounters:  10/09/22 195 lb 6.4 oz (88.6 kg)  08/11/22 196 lb (88.9 kg)  08/05/22 196 lb (88.9 kg)    Physical Exam Vitals and nursing note reviewed.  Constitutional:      Appearance: Normal appearance. She is well-developed.  HENT:     Head: Normocephalic and atraumatic.     Nose: Nose normal.     Mouth/Throat:     Mouth: Mucous membranes are moist.     Pharynx: Oropharynx is clear.  Eyes:     Extraocular Movements: Extraocular movements intact.     Conjunctiva/sclera: Conjunctivae normal.     Pupils: Pupils are equal, round, and reactive to light.  Cardiovascular:     Rate and Rhythm: Normal rate and regular rhythm.     Pulses: Normal pulses.     Heart sounds: Normal heart sounds.     Comments: ECG done today is abnormal. It is showing Normal sinus rhythm with anterior infarct, age undetermined.  Pulmonary:     Effort: Pulmonary effort is normal.     Breath sounds: Normal breath sounds.  Abdominal:     Palpations: Abdomen is soft.  Musculoskeletal:     Cervical back: Normal range of motion and neck supple.     Left knee: Swelling and crepitus present. Decreased range of motion. Tenderness present.  Lymphadenopathy:     Cervical: No cervical adenopathy.  Skin:    General: Skin is warm and dry.     Capillary Refill: Capillary refill takes less than 2 seconds.  Neurological:     General: No focal deficit present.     Mental Status: She is alert and oriented to person, place, and time.  Psychiatric:        Mood and Affect: Mood normal.        Behavior: Behavior normal.        Thought Content: Thought content normal.        Judgment: Judgment normal.       Assessment & Plan    Encounter for preoperative examination for general surgical procedure Assessment &  Plan: Patient to have left total knee replacement.  -abnormal ECG today. Showing normal sinus rhythm with anterior infarct, age undetermined.  -this is unchanged from ECGs done previously in this office and in ED.  -patient was evaluated by cardiology 05/2022. Had normal ECG in that office.  -patient is clear to have  surgery with low to moderate risk due to history of Type 2 diabetes and abnormal ECG.   Orders: -     EKG 12-Lead  Diarrhea, unspecified type Assessment & Plan: Chronic problem.  -continue to take loperamide as needed and as prescribed -generally managed per GI provider   Orders: -     Loperamide HCl; Take 4 mg po once. Repeat with 2mg  po after each loose stool. Max dose is 16 mg/day  Dispense: 135 capsule; Refill: 1  Perennial allergic rhinitis Assessment & Plan: Trial Clarinex daily  -continue singulair 10 mg daily  -use nasal spray as needed and as prescribed   Orders: -     Desloratadine; Take 1 tablet (5 mg total) by mouth daily.  Dispense: 90 tablet; Refill: 1  Type 2 diabetes mellitus with peripheral neuropathy (HCC) Assessment & Plan: Most recent HgbA1c done 07/22/2022 and  was 5.8.  -continue diabetic medication as prescribed    Essential hypertension Assessment & Plan: Blood pressure well controlled  -Continue current medication.       Return in 3 months (on 01/09/2023) for as scheduled, diabetes with HgbA1c check.         Carlean Jews, NP  [facility] [phone] (phone) [phone] (fax)  [facility]

## 2022-10-12 ENCOUNTER — Ambulatory Visit (HOSPITAL_BASED_OUTPATIENT_CLINIC_OR_DEPARTMENT_OTHER): Payer: Medicare Other | Admitting: Pain Medicine

## 2022-10-12 DIAGNOSIS — Z91199 Patient's noncompliance with other medical treatment and regimen due to unspecified reason: Secondary | ICD-10-CM

## 2022-10-12 NOTE — Progress Notes (Signed)
NO-SHOW to postprocedure evaluation on 10/12/2022.

## 2022-10-15 ENCOUNTER — Telehealth: Payer: Self-pay | Admitting: *Deleted

## 2022-10-15 NOTE — Telephone Encounter (Signed)
Pt LVM stating that she was seen by Provider last week and that her medical clearance has not been received at the office she is having surgery at. Please update her about this.

## 2022-10-16 DIAGNOSIS — J3089 Other allergic rhinitis: Secondary | ICD-10-CM | POA: Insufficient documentation

## 2022-10-16 NOTE — Telephone Encounter (Signed)
Provider is advised of this paperwork 10/16/2022

## 2022-10-16 NOTE — Assessment & Plan Note (Signed)
Patient to have left total knee replacement.  -abnormal ECG today. Showing normal sinus rhythm with anterior infarct, age undetermined.  -this is unchanged from ECGs done previously in this office and in ED.  -patient was evaluated by cardiology 05/2022. Had normal ECG in that office.  -patient is clear to have surgery with low to moderate risk due to history of Type 2 diabetes and abnormal ECG.

## 2022-10-16 NOTE — Assessment & Plan Note (Signed)
>>  ASSESSMENT AND PLAN FOR PERENNIAL ALLERGIC RHINITIS WRITTEN ON 10/16/2022  9:05 AM BY BOSCIA, HEATHER E, NP  Trial Clarinex daily  -continue singulair 10 mg daily  -use nasal spray as needed and as prescribed

## 2022-10-16 NOTE — Assessment & Plan Note (Signed)
Trial Clarinex daily  -continue singulair 10 mg daily  -use nasal spray as needed and as prescribed

## 2022-10-16 NOTE — Assessment & Plan Note (Signed)
Chronic problem.  -continue to take loperamide as needed and as prescribed -generally managed per GI provider

## 2022-10-16 NOTE — Assessment & Plan Note (Signed)
Most recent HgbA1c done 07/22/2022 and  was 5.8.  -continue diabetic medication as prescribed

## 2022-10-16 NOTE — Assessment & Plan Note (Signed)
Blood pressure well controlled Continue current medication 

## 2022-10-16 NOTE — Telephone Encounter (Signed)
Paperwork has been faxed 10/16/2022

## 2022-10-20 NOTE — Progress Notes (Unsigned)
PROVIDER NOTE: Information contained herein reflects review and annotations entered in association with encounter. Interpretation of such information and data should be left to medically-trained personnel. Information provided to patient can be located elsewhere in the medical record under "Patient Instructions". Document created using STT-dictation technology, any transcriptional errors that may result from process are unintentional.    Patient: Wendy Hubbard  Service Category: E/M  Provider: Oswaldo Done, MD  DOB: Oct 04, 1955  DOS: 10/21/2022  Referring Provider: Carlean Jews, NP  MRN: [id number]  Specialty: Interventional Pain Management  PCP: Carlean Jews, NP  Type: Established Patient  Setting: Ambulatory outpatient    Location: Office  Delivery: Face-to-face     HPI  [name], a 67 y.o. year old female, is here today because of her No primary diagnosis found.. [name] primary complain today is No chief complaint on file.  Pertinent problems: [name] has Chronic knee pain (2ry area of Pain) (Left); Primary osteoarthritis of left knee; Chronic feet pain (1ry area of Pain) (Bilateral); Peripheral neuropathic pain; Migraine without aura and without status migrainosus, not intractable; Rhomboid muscle pain; Intermittent claudication (HCC); Restless legs syndrome; Other intervertebral disc degeneration, lumbar region; Spondylolisthesis of lumbar region; Generalized abdominal pain; Mesenteric adenitis; Chronic shoulder pain (5th area of Pain) (Bilateral) (L>R); Sensorimotor neuropathy; Acute pain of right shoulder; Acute bilateral low back pain with bilateral sciatica; Chronic neck pain (4th area of Pain) (Bilateral) (L>R); Mass of lower outer quadrant of left breast; Shoulder blade pain; Pain in joint of right shoulder; Chronic pain syndrome; Diabetic peripheral neuropathy (HCC); Abnormal NCS (nerve conduction studies) (06/14/2020); Abnormal MRI, lumbar spine (02/25/2022); Abnormal  CT scan, lumbar spine (06/25/2022); Abnormal MRI, cervical spine (12/14/2019); Chronic low back pain (3ry area of Pain) (Bilateral) (L>R) w/o sciatica; Chronic occipital headache (Left); Cervicogenic headache (Left); Injury of right middle finger; Chronic upper extremity pain (7th area of Pain) (Left); and Chronic painful diabetic neuropathy (HCC) on their pertinent problem list. Pain Assessment: Severity of   is reported as a  /10. Location:    / . Onset:  . Quality:  . Timing:  . Modifying factor(s):  Marland Kitchen Vitals:  vitals were not taken for this visit.  BMI: Estimated body mass index is 33.54 kg/m as calculated from the following:   Height as of 10/09/22: 5\' 4"  (1.626 m).   Weight as of 10/09/22: 195 lb 6.4 oz (88.6 kg). Last encounter: 10/12/2022. Last procedure: 08/11/2022.  Reason for encounter:  *** . ***  Pharmacotherapy Assessment  Analgesic: oxycodone/APAP 5/325, 1 tab p.o. 4 times daily (# 20) (last filled on 06/25/2022) MME/day: 30 mg/day   Monitoring: [place] PMP: PDMP reviewed during this encounter.       Pharmacotherapy: No side-effects or adverse reactions reported. Compliance: No problems identified. Effectiveness: Clinically acceptable.  No notes on file  No results found for: "CBDTHCR" No results found for: "D8THCCBX" No results found for: "D9THCCBX"  UDS:  Summary  Date Value Ref Range Status  08/05/2022 Note  Final    Comment:    ==================================================================== Compliance Drug Analysis, Ur ==================================================================== Test                             Result       Flag       Units  Drug Present and Declared for Prescription Verification   Pregabalin  PRESENT      EXPECTED   Methocarbamol                  PRESENT      EXPECTED   Citalopram                     PRESENT      EXPECTED   Desmethylcitalopram            PRESENT      EXPECTED    Desmethylcitalopram is an expected  metabolite of citalopram or the    enantiomeric form, escitalopram.    Duloxetine                     PRESENT      EXPECTED   Mirtazapine                    PRESENT      EXPECTED   Metoprolol                     PRESENT      EXPECTED  Drug Absent but Declared for Prescription Verification   Alprazolam                     Not Detected UNEXPECTED ng/mg creat   Oxycodone                      Not Detected UNEXPECTED ng/mg creat ==================================================================== Test                      Result    Flag   Units      Ref Range   Creatinine              187              mg/dL      >=16 ==================================================================== Declared Medications:  The flagging and interpretation on this report are based on the  following declared medications.  Unexpected results may arise from  inaccuracies in the declared medications.   **Note: The testing scope of this panel includes these medications:   Alprazolam (Xanax)  Duloxetine  Escitalopram (Lexapro)  Methocarbamol (Robaxin)  Metoprolol (Toprol)  Mirtazapine (Remeron)  Oxycodone  Pregabalin (Lyrica)   **Note: The testing scope of this panel does not include the  following reported medications:   Alosetron  Cilostazol (Pletal)  Furosemide (Lasix)  Loperamide (Imodium)  Montelukast (Singulair)  Multivitamin  Pantoprazole (Protonix)  Ropinirole (Requip)  Rosuvastatin (Crestor)  Semaglutide ==================================================================== For clinical consultation, please call [phone]. ====================================================================       ROS  Constitutional: Denies any fever or chills Gastrointestinal: No reported hemesis, hematochezia, vomiting, or acute GI distress Musculoskeletal: Denies any acute onset joint swelling, redness, loss of ROM, or weakness Neurological: No reported episodes of acute onset apraxia,  aphasia, dysarthria, agnosia, amnesia, paralysis, loss of coordination, or loss of consciousness  Medication Review  ALPRAZolam, DULoxetine, Multiple Vitamins-Minerals, Semaglutide (1 MG/DOSE), alosetron, cilostazol, desloratadine, escitalopram, furosemide, loperamide, methocarbamol, metoprolol succinate, mirtazapine, montelukast, pantoprazole, pregabalin, rOPINIRole, and rosuvastatin  History Review  Allergy: Ms. Mcfield is allergic to flagyl [metronidazole], penicillins, and doxycycline. Drug: Ms. Brich  reports no history of drug use. Alcohol:  reports current alcohol use of about 2.0 standard drinks of alcohol per week. Tobacco:  reports that she quit smoking about 28 years ago. Her smoking use included cigarettes. She has never used smokeless  tobacco. Social: Ms. Sallie  reports that she quit smoking about 28 years ago. Her smoking use included cigarettes. She has never used smokeless tobacco. She reports current alcohol use of about 2.0 standard drinks of alcohol per week. She reports that she does not use drugs. Medical:  has a past medical history of Allergy, Anxiety, COPD (chronic obstructive pulmonary disease) (HCC), DDD (degenerative disc disease), thoracic, Depression, Diabetes mellitus without complication (HCC), GERD (gastroesophageal reflux disease), Hyperlipidemia, Nausea and vomiting (03/04/2022), Neuromuscular disorder (HCC), Pneumonia, Rapid heart rate, Sleep apnea, and Wears contact lenses. Surgical: Ms. Sgarlata  has a past surgical history that includes Tonsillectomy; Abdominal hysterectomy; Breast surgery; Cholecystectomy; Appendectomy; Ankle arthroscopy (Bilateral); Knee arthroscopy (Left); Bladder suspension; Esophagogastroduodenoscopy (N/A, 11/02/2015); Colonoscopy (N/A, 11/04/2015); Reduction mammaplasty (Bilateral, 1988); Breast excisional biopsy (Left, 1990); Breast excisional biopsy (Right, 1989); Colonoscopy with propofol (N/A, 08/30/2020); polypectomy (08/30/2020); Gastrojejunostomy  (N/A, 08/25/2021); Partial gastrectomy (N/A, 08/25/2021); Upper gi endoscopy (08/25/2021); and Esophagogastroduodenoscopy (egd) with propofol (N/A, 03/16/2022). Family: family history includes Bladder Cancer in her mother; Cancer in her mother; Heart attack in her father; High Cholesterol in her mother; High blood pressure in her father and mother; Hypertension in an other family member.  Laboratory Chemistry Profile   Renal Lab Results  Component Value Date   BUN 13 07/22/2022   CREATININE 0.88 07/22/2022   BCR 15 07/22/2022   GFRAA 97 03/07/2020   GFRNONAA >60 08/28/2021    Hepatic Lab Results  Component Value Date   AST 13 07/22/2022   ALT 12 07/22/2022   ALBUMIN 4.4 07/22/2022   ALKPHOS 174 (H) 07/22/2022   LIPASE 44 11/01/2015    Electrolytes Lab Results  Component Value Date   NA 142 07/22/2022   K 4.5 07/22/2022   CL 103 07/22/2022   CALCIUM 9.4 07/22/2022   MG 2.2 08/27/2021    Bone Lab Results  Component Value Date   VD25OH 24.4 (L) 07/22/2022    Inflammation (CRP: Acute Phase) (ESR: Chronic Phase) Lab Results  Component Value Date   CRP 3 08/05/2022   ESRSEDRATE 6 08/05/2022   LATICACIDVEN 1.7 02/26/2020         Note: Above Lab results reviewed.  Recent Imaging Review  ECHOCARDIOGRAM COMPLETE    ECHOCARDIOGRAM REPORT       Patient Name:   HIYAB MALAVE Mancha Date of Exam: 08/03/2022 Medical Rec #:  [id number]    Height:       64.0 in Accession #:    [id number]   Weight:       196.1 lb Date of Birth:  1956/01/18    BSA:          1.940 m Patient Age:    66 years     BP:           135/70 mmHg Patient Gender: F            HR:           70 bpm. Exam Location:  [facility]  Procedure: 2D Echo, 3D Echo, Cardiac Doppler, Color Doppler and Strain Analysis  Indications:    R00.2 Palpitations   History:        Patient has no prior history of Echocardiogram examinations.                 Signs/Symptoms:Shortness of Breath and Chest Pain; Risk                  Factors:Hypertension, Diabetes and Former Smoker.   Sonographer:  Rolland Porter Referring Phys: [id number] MARK C SKAINS  IMPRESSIONS   1. Left ventricular ejection fraction, by estimation, is 60 to 65%. The left ventricle has normal function. The left ventricle has no regional wall motion abnormalities. There is mild left ventricular hypertrophy. Left ventricular diastolic parameters  are consistent with Grade I diastolic dysfunction (impaired relaxation).  2. Right ventricular systolic function is normal. The right ventricular size is normal.  3. The mitral valve is normal in structure. No evidence of mitral valve regurgitation.  4. The aortic valve is tricuspid. Aortic valve regurgitation is not visualized.  5. The inferior vena cava is normal in size with greater than 50% respiratory variability, suggesting right atrial pressure of 3 mmHg.  FINDINGS  Left Ventricle: Left ventricular ejection fraction, by estimation, is 60 to 65%. The left ventricle has normal function. The left ventricle has no regional wall motion abnormalities. Global longitudinal strain performed but not reported based on  interpreter judgement due to suboptimal tracking. 3D ejection fraction reviewed and evaluated as part of the interpretation. Alternate measurement of EF is felt to be most reflective of LV function. The left ventricular internal cavity size was normal in  size. There is mild left ventricular hypertrophy. Left ventricular diastolic parameters are consistent with Grade I diastolic dysfunction (impaired relaxation).  Right Ventricle: The right ventricular size is normal. No increase in right ventricular wall thickness. Right ventricular systolic function is normal.  Left Atrium: Left atrial size was normal in size.  Right Atrium: Right atrial size was normal in size.  Pericardium: There is no evidence of pericardial effusion.  Mitral Valve: The mitral valve is normal in structure. No evidence of mitral  valve regurgitation.  Tricuspid Valve: The tricuspid valve is normal in structure. Tricuspid valve regurgitation is not demonstrated.  Aortic Valve: The aortic valve is tricuspid. Aortic valve regurgitation is not visualized. Aortic valve mean gradient measures 2.0 mmHg. Aortic valve peak gradient measures 3.4 mmHg. Aortic valve area, by VTI measures 2.67 cm.  Pulmonic Valve: The pulmonic valve was not well visualized. Pulmonic valve regurgitation is not visualized.  Aorta: The aortic root and ascending aorta are structurally normal, with no evidence of dilitation.  Venous: The inferior vena cava is normal in size with greater than 50% respiratory variability, suggesting right atrial pressure of 3 mmHg.  IAS/Shunts: No atrial level shunt detected by color flow Doppler.    LEFT VENTRICLE PLAX 2D LVIDd:         3.70 cm   Diastology LVIDs:         2.00 cm   LV e' medial:    7.18 cm/s LV PW:         0.90 cm   LV E/e' medial:  9.1 LV IVS:        1.20 cm   LV e' lateral:   7.83 cm/s LVOT diam:     1.90 cm   LV E/e' lateral: 8.3 LV SV:         51 LV SV Index:   26 LVOT Area:     2.84 cm                            3D Volume EF:                          3D EF:        53 %  LV EDV:       117 ml                          LV ESV:       55 ml                          LV SV:        62 ml  RIGHT VENTRICLE RV S prime:     11.50 cm/s TAPSE (M-mode): 2.4 cm  LEFT ATRIUM         Index LA diam:    3.20 cm 1.65 cm/m  AORTIC VALVE                    PULMONIC VALVE AV Area (Vmax):    2.64 cm     PV Vmax:          0.96 m/s AV Area (Vmean):   2.49 cm     PV Peak grad:     3.7 mmHg AV Area (VTI):     2.67 cm     PR End Diast Vel: 3.28 msec AV Vmax:           92.30 cm/s AV Vmean:          64.400 cm/s AV VTI:            0.191 m AV Peak Grad:      3.4 mmHg AV Mean Grad:      2.0 mmHg LVOT Vmax:         85.90 cm/s LVOT Vmean:        56.600 cm/s LVOT VTI:          0.180  m LVOT/AV VTI ratio: 0.94   AORTA Ao Root diam: 3.20 cm Ao Asc diam:  3.00 cm  MITRAL VALVE MV Area (PHT): 3.27 cm    SHUNTS MV Decel Time: 232 msec    Systemic VTI:  0.18 m MV E velocity: 65.00 cm/s  Systemic Diam: 1.90 cm MV A velocity: 90.10 cm/s MV E/A ratio:  0.72  Debbe Odea MD Electronically signed by Debbe Odea MD Signature Date/Time: 08/03/2022/5:15:22 PM      Final   Note: Reviewed        Physical Exam  General appearance: Well nourished, well developed, and well hydrated. In no apparent acute distress Mental status: Alert, oriented x 3 (person, place, & time)       Respiratory: No evidence of acute respiratory distress Eyes: PERLA Vitals: There were no vitals taken for this visit. BMI: Estimated body mass index is 33.54 kg/m as calculated from the following:   Height as of 10/09/22: 5\' 4"  (1.626 m).   Weight as of 10/09/22: 195 lb 6.4 oz (88.6 kg). Ideal: Ideal body weight: 54.7 kg (120 lb 9.5 oz) Adjusted ideal body weight: 68.3 kg (150 lb 8.2 oz)  Assessment   Diagnosis Status  No diagnosis found. Controlled Controlled Controlled   Updated Problems: No problems updated.  Plan of Care  Problem-specific:  No problem-specific Assessment & Plan notes found for this encounter.  Ms. Genysis Ballas Jastrzebski has a current medication list which includes the following Lint-term medication(s): alosetron, desloratadine, duloxetine, escitalopram, furosemide, metoprolol succinate, mirtazapine, montelukast, pantoprazole, pregabalin, ropinirole, and rosuvastatin.  Pharmacotherapy (Medications Ordered): No orders of the defined types were placed in this encounter.  Orders:  No orders of the defined types were placed in this  encounter.  Follow-up plan:   No follow-ups on file.      Interventional Therapies  Risk Factors  Considerations:     Planned  Pending:   See above for possible orders   Under consideration:   Therapeutic bilateral Qutenza  treatment #1    Completed:   None at this time   Completed by other providers:   None reported   Therapeutic  Palliative (PRN) options:   None established        Recent Visits Date Type Provider Dept  08/11/22 Procedure visit Delano Metz, MD [facility]  08/05/22 Office Visit Delano Metz, MD [facility]  Showing recent visits within past 90 days and meeting all other requirements Future Appointments Date Type Provider Dept  10/21/22 Appointment Delano Metz, MD [facility]  Showing future appointments within next 90 days and meeting all other requirements  I discussed the assessment and treatment plan with the patient. The patient was provided an opportunity to ask questions and all were answered. The patient agreed with the plan and demonstrated an understanding of the instructions.  Patient advised to call back or seek an in-person evaluation if the symptoms or condition worsens.  Duration of encounter: *** minutes.  Total time on encounter, as per AMA guidelines included both the face-to-face and non-face-to-face time personally spent by the physician and/or other qualified health care professional(s) on the day of the encounter (includes time in activities that require the physician or other qualified health care professional and does not include time in activities normally performed by clinical staff). Physician's time may include the following activities when performed: Preparing to see the patient (e.g., pre-charting review of records, searching for previously ordered imaging, lab work, and nerve conduction tests) Review of prior analgesic pharmacotherapies. Reviewing PMP Interpreting ordered tests (e.g., lab work, imaging, nerve conduction tests) Performing post-procedure evaluations, including interpretation of diagnostic procedures Obtaining and/or reviewing separately obtained history Performing a medically appropriate  examination and/or evaluation Counseling and educating the patient/family/caregiver Ordering medications, tests, or procedures Referring and communicating with other health care professionals (when not separately reported) Documenting clinical information in the electronic or other health record Independently interpreting results (not separately reported) and communicating results to the patient/ family/caregiver Care coordination (not separately reported)  Note by: Oswaldo Done, MD Date: 10/21/2022; Time: 11:12 AM

## 2022-10-21 ENCOUNTER — Encounter: Payer: Self-pay | Admitting: Pain Medicine

## 2022-10-21 ENCOUNTER — Ambulatory Visit: Payer: Medicare Other | Attending: Pain Medicine | Admitting: Pain Medicine

## 2022-10-21 VITALS — BP 140/72 | HR 81 | Temp 97.0°F | Resp 16 | Ht 64.0 in | Wt 195.0 lb

## 2022-10-21 DIAGNOSIS — M79602 Pain in left arm: Secondary | ICD-10-CM | POA: Insufficient documentation

## 2022-10-21 DIAGNOSIS — G8929 Other chronic pain: Secondary | ICD-10-CM | POA: Insufficient documentation

## 2022-10-21 DIAGNOSIS — Z7985 Long-term (current) use of injectable non-insulin antidiabetic drugs: Secondary | ICD-10-CM

## 2022-10-21 DIAGNOSIS — M25512 Pain in left shoulder: Secondary | ICD-10-CM | POA: Diagnosis not present

## 2022-10-21 DIAGNOSIS — M79671 Pain in right foot: Secondary | ICD-10-CM | POA: Diagnosis not present

## 2022-10-21 DIAGNOSIS — M47817 Spondylosis without myelopathy or radiculopathy, lumbosacral region: Secondary | ICD-10-CM | POA: Diagnosis not present

## 2022-10-21 DIAGNOSIS — M542 Cervicalgia: Secondary | ICD-10-CM | POA: Diagnosis not present

## 2022-10-21 DIAGNOSIS — M25511 Pain in right shoulder: Secondary | ICD-10-CM | POA: Insufficient documentation

## 2022-10-21 DIAGNOSIS — M5459 Other low back pain: Secondary | ICD-10-CM | POA: Insufficient documentation

## 2022-10-21 DIAGNOSIS — M25562 Pain in left knee: Secondary | ICD-10-CM | POA: Diagnosis not present

## 2022-10-21 DIAGNOSIS — R937 Abnormal findings on diagnostic imaging of other parts of musculoskeletal system: Secondary | ICD-10-CM | POA: Insufficient documentation

## 2022-10-21 DIAGNOSIS — E1142 Type 2 diabetes mellitus with diabetic polyneuropathy: Secondary | ICD-10-CM | POA: Diagnosis not present

## 2022-10-21 DIAGNOSIS — R94130 Abnormal response to nerve stimulation, unspecified: Secondary | ICD-10-CM | POA: Insufficient documentation

## 2022-10-21 DIAGNOSIS — M79672 Pain in left foot: Secondary | ICD-10-CM | POA: Insufficient documentation

## 2022-10-21 DIAGNOSIS — M545 Low back pain, unspecified: Secondary | ICD-10-CM | POA: Insufficient documentation

## 2022-10-21 DIAGNOSIS — Z09 Encounter for follow-up examination after completed treatment for conditions other than malignant neoplasm: Secondary | ICD-10-CM | POA: Insufficient documentation

## 2022-10-21 DIAGNOSIS — G894 Chronic pain syndrome: Secondary | ICD-10-CM | POA: Insufficient documentation

## 2022-10-21 NOTE — Patient Instructions (Signed)

## 2022-10-22 ENCOUNTER — Telehealth: Payer: Self-pay

## 2022-10-22 ENCOUNTER — Ambulatory Visit: Payer: Medicare Other | Admitting: Nurse Practitioner

## 2022-10-22 NOTE — Telephone Encounter (Signed)
She is scheduled for lumbar facets on 11/05/22 and has questions about her blood thinners

## 2022-10-22 NOTE — Telephone Encounter (Signed)
Returned patient phone call.  Instructed to stop Pletal x 3 days prior to procedure.  Patient reports that she is able to safely do this.  Verbalizes u/o information

## 2022-11-02 NOTE — Progress Notes (Unsigned)
PROVIDER NOTE: Interpretation of information contained herein should be left to medically-trained personnel. Specific patient instructions are provided elsewhere under "Patient Instructions" section of medical record. This document was created in part using STT-dictation technology, any transcriptional errors that may result from this process are unintentional.  Patient: Wendy Hubbard Type: Established DOB: [birth year] MRN: [id number] PCP: Carlean Jews, NP  Service: Procedure DOS: 11/05/2022 Setting: Ambulatory Location: Ambulatory outpatient facility Delivery: Face-to-face Provider: Oswaldo Done, MD Specialty: Interventional Pain Management Specialty designation: 09 Location: Outpatient facility Ref. Prov.: Delano Metz, MD       Interventional Therapy   Procedure: Lumbar Facet, Medial Branch Block(s) #1 (NO STEROIDS) Laterality: Bilateral  Level: T12, L1, L2, L3, L4, L5, and S1 Medial Branch Level(s). Injecting these levels blocks the L1-2, L2-3, L3-4, L4-5, and L5-S1 lumbar facet joints. Imaging: Fluoroscopic guidance         Anesthesia: Local anesthesia (1-2% Lidocaine) Anxiolysis:    ***    Versed         Sedation:                         DOS: 11/05/2022 Performed by: Oswaldo Done, MD  Primary Purpose: Diagnostic/Therapeutic Indications: Low back pain severe enough to impact quality of life or function. No diagnosis found. NAS-11 Pain score:   Pre-procedure:  /10   Post-procedure:  /10     Position / Prep / Materials:  Position: Prone  Prep solution: DuraPrep (Iodine Povacrylex [0.7% available iodine] and Isopropyl Alcohol, 74% w/w) Area Prepped: Posterolateral Lumbosacral Spine (Wide prep: From the lower border of the scapula down to the end of the tailbone and from flank to flank.)  Materials:  Tray: Block Needle(s):  Type: Spinal  Gauge (G): 22  Length: 5-in Qty:    ***      Pre-op H&P Assessment:  Wendy Hubbard is a 67 y.o. (year old), female  patient, seen today for interventional treatment. She  has a past surgical history that includes Tonsillectomy; Abdominal hysterectomy; Breast surgery; Cholecystectomy; Appendectomy; Ankle arthroscopy (Bilateral); Knee arthroscopy (Left); Bladder suspension; Esophagogastroduodenoscopy (N/A, 11/02/2015); Colonoscopy (N/A, 11/04/2015); Reduction mammaplasty (Bilateral, 1988); Breast excisional biopsy (Left, 1990); Breast excisional biopsy (Right, 1989); Colonoscopy with propofol (N/A, 08/30/2020); polypectomy (08/30/2020); Gastrojejunostomy (N/A, 08/25/2021); Partial gastrectomy (N/A, 08/25/2021); Upper gi endoscopy (08/25/2021); and Esophagogastroduodenoscopy (egd) with propofol (N/A, 03/16/2022). Wendy Hubbard has a current medication list which includes the following prescription(s): alosetron, alprazolam, cilostazol, cyanocobalamin, desloratadine, duloxetine, escitalopram, furosemide, loperamide, methocarbamol, metoprolol succinate, mirtazapine, montelukast, multiple vitamins-minerals, pantoprazole, pregabalin, ropinirole, rosuvastatin, and semaglutide (1 mg/dose). Her primarily concern today is the No chief complaint on file.  Initial Vital Signs:  Pulse/HCG Rate:    Temp:   Resp:   BP:   SpO2:    BMI: Estimated body mass index is 33.47 kg/m as calculated from the following:   Height as of 10/21/22: 5\' 4"  (1.626 m).   Weight as of 10/21/22: 195 lb (88.5 kg).  Risk Assessment: Allergies: Reviewed. She is allergic to flagyl [metronidazole], penicillins, and doxycycline.  Allergy Precautions: None required Coagulopathies: Reviewed. None identified.  Blood-thinner therapy: None at this time Active Infection(s): Reviewed. None identified. Wendy Hubbard is afebrile  Site Confirmation: Wendy Hubbard was asked to confirm the procedure and laterality before marking the site Procedure checklist: Completed Consent: Before the procedure and under the influence of no sedative(s), amnesic(s), or anxiolytics, the patient was  informed of the treatment options, risks and possible complications. To fulfill  our ethical and legal obligations, as recommended by the American Medical Association's Code of Ethics, I have informed the patient of my clinical impression; the nature and purpose of the treatment or procedure; the risks, benefits, and possible complications of the intervention; the alternatives, including doing nothing; the risk(s) and benefit(s) of the alternative treatment(s) or procedure(s); and the risk(s) and benefit(s) of doing nothing. The patient was provided information about the general risks and possible complications associated with the procedure. These may include, but are not limited to: failure to achieve desired goals, infection, bleeding, organ or nerve damage, allergic reactions, paralysis, and death. In addition, the patient was informed of those risks and complications associated to Spine-related procedures, such as failure to decrease pain; infection (i.e.: Meningitis, epidural or intraspinal abscess); bleeding (i.e.: epidural hematoma, subarachnoid hemorrhage, or any other type of intraspinal or peri-dural bleeding); organ or nerve damage (i.e.: Any type of peripheral nerve, nerve root, or spinal cord injury) with subsequent damage to sensory, motor, and/or autonomic systems, resulting in permanent pain, numbness, and/or weakness of one or several areas of the body; allergic reactions; (i.e.: anaphylactic reaction); and/or death. Furthermore, the patient was informed of those risks and complications associated with the medications. These include, but are not limited to: allergic reactions (i.e.: anaphylactic or anaphylactoid reaction(s)); adrenal axis suppression; blood sugar elevation that in diabetics may result in ketoacidosis or comma; water retention that in patients with history of congestive heart failure may result in shortness of breath, pulmonary edema, and decompensation with resultant heart  failure; weight gain; swelling or edema; medication-induced neural toxicity; particulate matter embolism and blood vessel occlusion with resultant organ, and/or nervous system infarction; and/or aseptic necrosis of one or more joints. Finally, the patient was informed that Medicine is not an exact science; therefore, there is also the possibility of unforeseen or unpredictable risks and/or possible complications that may result in a catastrophic outcome. The patient indicated having understood very clearly. We have given the patient no guarantees and we have made no promises. Enough time was given to the patient to ask questions, all of which were answered to the patient's satisfaction. Ms. Delmont has indicated that she wanted to continue with the procedure. Attestation: I, the ordering provider, attest that I have discussed with the patient the benefits, risks, side-effects, alternatives, likelihood of achieving goals, and potential problems during recovery for the procedure that I have provided informed consent. Date  Time: {CHL [facility]-PAIN TIME CHOICES:[id number]}   Pre-Procedure Preparation:  Monitoring: As per clinic protocol. Respiration, ETCO2, SpO2, BP, heart rate and rhythm monitor placed and checked for adequate function Safety Precautions: Patient was assessed for positional comfort and pressure points before starting the procedure. Time-out: I initiated and conducted the "Time-out" before starting the procedure, as per protocol. The patient was asked to participate by confirming the accuracy of the "Time Out" information. Verification of the correct person, site, and procedure were performed and confirmed by me, the nursing staff, and the patient. "Time-out" conducted as per Joint Commission's Universal Protocol (UP.01.01.01). Time:   Start Time:   hrs.  Description of Procedure:          Laterality: (see above) Targeted Levels: (see above)  Safety Precautions: Aspiration looking for blood  return was conducted prior to all injections. At no point did we inject any substances, as a needle was being advanced. Before injecting, the patient was told to immediately notify me if she was experiencing any new onset of "ringing in the ears, or metallic  taste in the mouth". No attempts were made at seeking any paresthesias. Safe injection practices and needle disposal techniques used. Medications properly checked for expiration dates. SDV (single dose vial) medications used. After the completion of the procedure, all disposable equipment used was discarded in the proper designated medical waste containers. Local Anesthesia: Protocol guidelines were followed. The patient was positioned over the fluoroscopy table. The area was prepped in the usual manner. The time-out was completed. The target area was identified using fluoroscopy. A 12-in Bowermaster, straight, sterile hemostat was used with fluoroscopic guidance to locate the targets for each level blocked. Once located, the skin was marked with an approved surgical skin marker. Once all sites were marked, the skin (epidermis, dermis, and hypodermis), as well as deeper tissues (fat, connective tissue and muscle) were infiltrated with a small amount of a short-acting local anesthetic, loaded on a 10cc syringe with a 25G, 1.5-in  Needle. An appropriate amount of time was allowed for local anesthetics to take effect before proceeding to the next step. Local Anesthetic: Lidocaine 2.0% The unused portion of the local anesthetic was discarded in the proper designated containers. Technical description of process:  L2 Medial Branch Nerve Block (MBB): The target area for the L2 medial branch is at the junction of the postero-lateral aspect of the superior articular process and the superior, posterior, and medial edge of the transverse process of L3. Under fluoroscopic guidance, a Quincke needle was inserted until contact was made with os over the superior postero-lateral  aspect of the pedicular shadow (target area). After negative aspiration for blood, 0.5 mL of the nerve block solution was injected without difficulty or complication. The needle was removed intact. L3 Medial Branch Nerve Block (MBB): The target area for the L3 medial branch is at the junction of the postero-lateral aspect of the superior articular process and the superior, posterior, and medial edge of the transverse process of L4. Under fluoroscopic guidance, a Quincke needle was inserted until contact was made with os over the superior postero-lateral aspect of the pedicular shadow (target area). After negative aspiration for blood, 0.5 mL of the nerve block solution was injected without difficulty or complication. The needle was removed intact. L4 Medial Branch Nerve Block (MBB): The target area for the L4 medial branch is at the junction of the postero-lateral aspect of the superior articular process and the superior, posterior, and medial edge of the transverse process of L5. Under fluoroscopic guidance, a Quincke needle was inserted until contact was made with os over the superior postero-lateral aspect of the pedicular shadow (target area). After negative aspiration for blood, 0.5 mL of the nerve block solution was injected without difficulty or complication. The needle was removed intact. L5 Medial Branch Nerve Block (MBB): The target area for the L5 medial branch is at the junction of the postero-lateral aspect of the superior articular process and the superior, posterior, and medial edge of the sacral ala. Under fluoroscopic guidance, a Quincke needle was inserted until contact was made with os over the superior postero-lateral aspect of the pedicular shadow (target area). After negative aspiration for blood, 0.5 mL of the nerve block solution was injected without difficulty or complication. The needle was removed intact. S1 Medial Branch Nerve Block (MBB): The target area for the S1 medial branch is  at the posterior and inferior 6 o'clock position of the L5-S1 facet joint. Under fluoroscopic guidance, the Quincke needle inserted for the L5 MBB was redirected until contact was made with os over  the inferior and postero aspect of the sacrum, at the 6 o' clock position under the L5-S1 facet joint (Target area). After negative aspiration for blood, 0.5 mL of the nerve block solution was injected without difficulty or complication. The needle was removed intact.  Once the entire procedure was completed, the treated area was cleaned, making sure to leave some of the prepping solution back to take advantage of its Solimine term bactericidal properties.         Illustration of the posterior view of the lumbar spine and the posterior neural structures. Laminae of L2 through S1 are labeled. DPRL5, dorsal primary ramus of L5; DPRS1, dorsal primary ramus of S1; DPR3, dorsal primary ramus of L3; FJ, facet (zygapophyseal) joint L3-L4; I, inferior articular process of L4; LB1, lateral branch of dorsal primary ramus of L1; IAB, inferior articular branches from L3 medial branch (supplies L4-L5 facet joint); IBP, intermediate branch plexus; MB3, medial branch of dorsal primary ramus of L3; NR3, third lumbar nerve root; S, superior articular process of L5; SAB, superior articular branches from L4 (supplies L4-5 facet joint also); TP3, transverse process of L3.   Facet Joint Innervation (* possible contribution)  L1-2 T12, L1 (L2*)  Medial Branch  L2-3 L1, L2 (L3*)         "          "  L3-4 L2, L3 (L4*)         "          "  L4-5 L3, L4 (L5*)         "          "  L5-S1 L4, L5, S1          "          "    There were no vitals filed for this visit.   End Time:   hrs.  Imaging Guidance (Spinal):          Type of Imaging Technique: Fluoroscopy Guidance (Spinal) Indication(s): Assistance in needle guidance and placement for procedures requiring needle placement in or near specific anatomical locations not easily  accessible without such assistance. Exposure Time: Please see nurses notes. Contrast: None used. Fluoroscopic Guidance: I was personally present during the use of fluoroscopy. "Tunnel Vision Technique" used to obtain the best possible view of the target area. Parallax error corrected before commencing the procedure. "Direction-depth-direction" technique used to introduce the needle under continuous pulsed fluoroscopy. Once target was reached, antero-posterior, oblique, and lateral fluoroscopic projection used confirm needle placement in all planes. Images permanently stored in EMR. Interpretation: No contrast injected. I personally interpreted the imaging intraoperatively. Adequate needle placement confirmed in multiple planes. Permanent images saved into the patient's record.  Post-operative Assessment:  Post-procedure Vital Signs:  Pulse/HCG Rate:    Temp:   Resp:   BP:   SpO2:    EBL: None  Complications: No immediate post-treatment complications observed by team, or reported by patient.  Note: The patient tolerated the entire procedure well. A repeat set of vitals were taken after the procedure and the patient was kept under observation following institutional policy, for this type of procedure. Post-procedural neurological assessment was performed, showing return to baseline, prior to discharge. The patient was provided with post-procedure discharge instructions, including a section on how to identify potential problems. Should any problems arise concerning this procedure, the patient was given instructions to immediately contact us, at any time, without hesitation. In any case, we plan to contact  the patient by telephone for a follow-up status report regarding this interventional procedure.  Comments:  No additional relevant information.  Plan of Care (POC)  Orders:  No orders of the defined types were placed in this encounter.  Chronic Opioid Analgesic:  oxycodone/APAP 5/325, 1 tab  p.o. 4 times daily (# 20) (last filled on 06/25/2022) MME/day: 30 mg/day   Medications ordered for procedure: No orders of the defined types were placed in this encounter.  Medications administered: Jadda Hunsucker. Houseworth had no medications administered during this visit.  See the medical record for exact dosing, route, and time of administration.  Follow-up plan:   No follow-ups on file.       Interventional Therapies  Risk Factors  Considerations:     Planned  Pending:   Diagnostic bilateral lumbar facet MBB #1 (NO STEROIDS)    Under consideration:   Diagnostic bilateral lumbar facet MBB #1 (NO STEROIDS)    Completed:   Therapeutic bilateral Qutenza treatment x1 (02/17/09/10)    Completed by other providers:   None reported   Therapeutic  Palliative (PRN) options:   None established         Recent Visits Date Type Provider Dept  10/21/22 Office Visit Delano Metz, MD [facility]  08/11/22 Procedure visit Delano Metz, MD [facility]  08/05/22 Office Visit Delano Metz, MD [facility]  Showing recent visits within past 90 days and meeting all other requirements Future Appointments Date Type Provider Dept  11/05/22 Appointment Delano Metz, MD [facility]  Showing future appointments within next 90 days and meeting all other requirements  Disposition: Discharge home  Discharge (Date  Time): 11/05/2022;   hrs.   Primary Care Physician: Carlean Jews, NP Location: [facility] Note by: Oswaldo Done, MD (TTS technology used. I apologize for any typographical errors that were not detected and corrected.) Date: 11/05/2022; Time: 6:20 AM  Disclaimer:  Medicine is not an Visual merchandiser. The only guarantee in medicine is that nothing is guaranteed. It is important to note that the decision to proceed with this intervention was based on the information collected from the  patient. The Data and conclusions were drawn from the patient's questionnaire, the interview, and the physical examination. Because the information was provided in large part by the patient, it cannot be guaranteed that it has not been purposely or unconsciously manipulated. Every effort has been made to obtain as much relevant data as possible for this evaluation. It is important to note that the conclusions that lead to this procedure are derived in large part from the available data. Always take into account that the treatment will also be dependent on availability of resources and existing treatment guidelines, considered by other Pain Management Practitioners as being common knowledge and practice, at the time of the intervention. For Medico-Legal purposes, it is also important to point out that variation in procedural techniques and pharmacological choices are the acceptable norm. The indications, contraindications, technique, and results of the above procedure should only be interpreted and judged by a Board-Certified Interventional Pain Specialist with extensive familiarity and expertise in the same exact procedure and technique.

## 2022-11-03 ENCOUNTER — Other Ambulatory Visit: Payer: Self-pay | Admitting: Nurse Practitioner

## 2022-11-03 DIAGNOSIS — G2581 Restless legs syndrome: Secondary | ICD-10-CM

## 2022-11-03 DIAGNOSIS — J301 Allergic rhinitis due to pollen: Secondary | ICD-10-CM

## 2022-11-03 DIAGNOSIS — F3341 Major depressive disorder, recurrent, in partial remission: Secondary | ICD-10-CM

## 2022-11-04 ENCOUNTER — Encounter: Payer: Self-pay | Admitting: Nurse Practitioner

## 2022-11-05 ENCOUNTER — Other Ambulatory Visit: Payer: Self-pay | Admitting: Nurse Practitioner

## 2022-11-05 ENCOUNTER — Ambulatory Visit: Payer: Medicare Other | Attending: Pain Medicine | Admitting: Pain Medicine

## 2022-11-05 ENCOUNTER — Ambulatory Visit
Admission: RE | Admit: 2022-11-05 | Discharge: 2022-11-05 | Disposition: A | Discharge status: Home / Self Care | Payer: Medicare Other | Source: Ambulatory Visit | Attending: Pain Medicine | Admitting: Pain Medicine

## 2022-11-05 ENCOUNTER — Encounter: Payer: Self-pay | Admitting: Pain Medicine

## 2022-11-05 VITALS — BP 146/57 | Temp 97.0°F | Resp 16 | Ht 64.0 in | Wt 194.0 lb

## 2022-11-05 DIAGNOSIS — M545 Low back pain, unspecified: Secondary | ICD-10-CM | POA: Insufficient documentation

## 2022-11-05 DIAGNOSIS — G4733 Obstructive sleep apnea (adult) (pediatric): Secondary | ICD-10-CM

## 2022-11-05 DIAGNOSIS — R937 Abnormal findings on diagnostic imaging of other parts of musculoskeletal system: Secondary | ICD-10-CM | POA: Diagnosis not present

## 2022-11-05 DIAGNOSIS — E1142 Type 2 diabetes mellitus with diabetic polyneuropathy: Secondary | ICD-10-CM

## 2022-11-05 DIAGNOSIS — M5459 Other low back pain: Secondary | ICD-10-CM | POA: Diagnosis not present

## 2022-11-05 DIAGNOSIS — M47816 Spondylosis without myelopathy or radiculopathy, lumbar region: Secondary | ICD-10-CM

## 2022-11-05 DIAGNOSIS — M47817 Spondylosis without myelopathy or radiculopathy, lumbosacral region: Secondary | ICD-10-CM | POA: Diagnosis not present

## 2022-11-05 DIAGNOSIS — G8929 Other chronic pain: Secondary | ICD-10-CM | POA: Insufficient documentation

## 2022-11-05 DIAGNOSIS — I10 Essential (primary) hypertension: Secondary | ICD-10-CM

## 2022-11-05 DIAGNOSIS — E6609 Other obesity due to excess calories: Secondary | ICD-10-CM

## 2022-11-05 MED ORDER — ROPIVACAINE HCL 2 MG/ML IJ SOLN
INTRAMUSCULAR | Status: AC
  Filled 2022-11-05: qty 20

## 2022-11-05 MED ORDER — FENTANYL CITRATE (PF) 100 MCG/2ML IJ SOLN
25.0000 ug | INTRAMUSCULAR | Status: DC | PRN
  Administered 2022-11-05: 50 ug via INTRAVENOUS

## 2022-11-05 MED ORDER — LACTATED RINGERS IV SOLN: Freq: Once | INTRAVENOUS | Status: AC

## 2022-11-05 MED ORDER — LIDOCAINE HCL 2 % IJ SOLN
20.0000 mL | Freq: Once | INTRAMUSCULAR | Status: AC
  Administered 2022-11-05: 400 mg

## 2022-11-05 MED ORDER — PENTAFLUOROPROP-TETRAFLUOROETH EX AERO
INHALATION_SPRAY | Freq: Once | CUTANEOUS | Status: AC
  Administered 2022-11-05: 30 via TOPICAL
  Filled 2022-11-05: qty 116

## 2022-11-05 MED ORDER — MIDAZOLAM HCL 5 MG/5ML IJ SOLN
0.5000 mg | Freq: Once | INTRAMUSCULAR | Status: AC
  Administered 2022-11-05: 2 mg via INTRAVENOUS

## 2022-11-05 MED ORDER — MIDAZOLAM HCL 5 MG/5ML IJ SOLN
INTRAMUSCULAR | Status: AC
  Filled 2022-11-05: qty 5

## 2022-11-05 MED ORDER — SEMAGLUTIDE-WEIGHT MANAGEMENT 1 MG/0.5ML ~~LOC~~ SOAJ
1.0000 mg | SUBCUTANEOUS | 2 refills | Status: DC
Start: 2022-11-05 — End: 2023-01-26

## 2022-11-05 MED ORDER — LIDOCAINE HCL 2 % IJ SOLN
INTRAMUSCULAR | Status: AC
  Filled 2022-11-05: qty 20

## 2022-11-05 MED ORDER — ROPIVACAINE HCL 2 MG/ML IJ SOLN
18.0000 mL | Freq: Once | INTRAMUSCULAR | Status: AC
  Administered 2022-11-05: 18 mL via PERINEURAL

## 2022-11-05 MED ORDER — FENTANYL CITRATE (PF) 100 MCG/2ML IJ SOLN
INTRAMUSCULAR | Status: AC
  Filled 2022-11-05: qty 2

## 2022-11-05 NOTE — Patient Instructions (Addendum)
____________________________________________________________________________________________  Post-Procedure Discharge Instructions  Instructions: Apply ice:  Purpose: This will minimize any swelling and discomfort after procedure.  When: Day of procedure, as soon as you get home. How: Fill a plastic sandwich bag with crushed ice. Cover it with a small towel and apply to injection site. How Preast: (15 min on, 15 min off) Apply for 15 minutes then remove x 15 minutes.  Repeat sequence on day of procedure, until you go to bed. Apply heat:  Purpose: To treat any soreness and discomfort from the procedure. When: Starting the next day after the procedure. How: Apply heat to procedure site starting the day following the procedure. How [place]: May continue to repeat daily, until discomfort goes away. Food intake: Start with clear liquids (like water) and advance to regular food, as tolerated.  Physical activities: Keep activities to a minimum for the first 8 hours after the procedure. After that, then as tolerated. Driving: If you have received any sedation, be responsible and do not drive. You are not allowed to drive for 24 hours after having sedation. Blood thinner: (Applies only to those taking blood thinners) You may restart your blood thinner 6 hours after your procedure. Insulin: (Applies only to Diabetic patients taking insulin) As soon as you can eat, you may resume your normal dosing schedule. Infection prevention: Keep procedure site clean and dry. Shower daily and clean area with soap and water. Post-procedure Pain Diary: Extremely important that this be done correctly and accurately. Recorded information will be used to determine the next step in treatment. For the purpose of accuracy, follow these rules: Evaluate only the area treated. Do not report or include pain from an untreated area. For the purpose of this evaluation, ignore all other areas of pain, except for the treated  area. After your procedure, avoid taking a Cease nap and attempting to complete the pain diary after you wake up. Instead, set your alarm clock to go off every hour, on the hour, for the initial 8 hours after the procedure. Document the duration of the numbing medicine, and the relief you are getting from it. Do not go to sleep and attempt to complete it later. It will not be accurate. If you received sedation, it is likely that you were given a medication that may cause amnesia. Because of this, completing the diary at a later time may cause the information to be inaccurate. This information is needed to plan your care. Follow-up appointment: Keep your post-procedure follow-up evaluation appointment after the procedure (usually 2 weeks for most procedures, 6 weeks for radiofrequencies). DO NOT FORGET to bring you pain diary with you.   Expect: (What should I expect to see with my procedure?) From numbing medicine (AKA: Local Anesthetics): Numbness or decrease in pain. You may also experience some weakness, which if present, could last for the duration of the local anesthetic. Onset: Full effect within 15 minutes of injected. Duration: It will depend on the type of local anesthetic used. On the average, 1 to 8 hours.  From steroids (No Steroids given) From procedure: Some discomfort is to be expected once the numbing medicine wears off. This should be minimal if ice and heat are applied as instructed.  Call if: (When should I call?) You experience numbness and weakness that gets worse with time, as opposed to wearing off. New onset bowel or bladder incontinence. (Applies only to procedures done in the spine)  Emergency Numbers: Durning business hours (Monday - Thursday, 8:00 AM - 4:00  PM) (Friday, 9:00 AM - 12:00 Noon): [phone] After hours: [phone] NOTE: If you are having a problem and are unable connect with, or to talk to a provider, then go to your nearest urgent care or emergency  department. If the problem is serious and urgent, please call 911. ____________________________________________________________________________________________

## 2022-11-06 ENCOUNTER — Telehealth: Payer: Self-pay | Admitting: *Deleted

## 2022-11-06 NOTE — Telephone Encounter (Signed)
Post procedure call;  voicemail left.   

## 2022-11-10 ENCOUNTER — Telehealth: Payer: Self-pay | Admitting: *Deleted

## 2022-11-10 NOTE — Telephone Encounter (Signed)
Pt calling stating that a PA form was sent last week for [facility] and she said that they have not heard back from [place]. Please advise the patient on the status of this PA.

## 2022-11-11 NOTE — Telephone Encounter (Signed)
Pt returning call and I informed her of below and she said that the insurance company was going to be sending another for after the denial I assume an appeal request.  She is wanting to know the status of this 2nd form that came after the denial. Please update patient with any information.

## 2022-11-11 NOTE — Telephone Encounter (Signed)
Called LVM to contact the office... if pt calls back her PA for the [facility] was denied on 11/05/2022

## 2022-11-24 ENCOUNTER — Emergency Department
Admission: EM | Admit: 2022-11-24 | Discharge: 2022-11-24 | Disposition: A | Discharge status: Home / Self Care | Payer: Medicare Other | Attending: Emergency Medicine | Admitting: Emergency Medicine

## 2022-11-24 ENCOUNTER — Telehealth: Payer: Self-pay

## 2022-11-24 ENCOUNTER — Encounter: Payer: Self-pay | Admitting: Emergency Medicine

## 2022-11-24 ENCOUNTER — Other Ambulatory Visit: Payer: Self-pay

## 2022-11-24 DIAGNOSIS — M26621 Arthralgia of right temporomandibular joint: Secondary | ICD-10-CM | POA: Diagnosis not present

## 2022-11-24 DIAGNOSIS — R6884 Jaw pain: Secondary | ICD-10-CM | POA: Diagnosis present

## 2022-11-24 DIAGNOSIS — M26601 Right temporomandibular joint disorder, unspecified: Secondary | ICD-10-CM | POA: Diagnosis not present

## 2022-11-24 MED ORDER — KETOROLAC TROMETHAMINE 30 MG/ML IJ SOLN
30.0000 mg | Freq: Once | INTRAMUSCULAR | Status: AC
  Administered 2022-11-24: 30 mg via INTRAMUSCULAR
  Filled 2022-11-24: qty 1

## 2022-11-24 MED ORDER — HYDROCODONE-ACETAMINOPHEN 5-325 MG PO TABS: 1.0000 | ORAL_TABLET | ORAL | 0 refills | Status: DC | PRN

## 2022-11-24 MED ORDER — PREDNISONE 10 MG PO TABS: 10.0000 mg | ORAL_TABLET | ORAL | 0 refills | Status: DC

## 2022-11-24 MED ORDER — DEXAMETHASONE SODIUM PHOSPHATE 10 MG/ML IJ SOLN
10.0000 mg | Freq: Once | INTRAMUSCULAR | Status: AC
  Administered 2022-11-24: 10 mg via INTRAMUSCULAR
  Filled 2022-11-24: qty 1

## 2022-11-24 NOTE — ED Triage Notes (Signed)
Patient to ED via POV for right sided jaw pain. Patient states he pain is specially in the joint by her right ear and started before bed last PM. Pain worsening throughout the day. Denies any chest pain or injury.

## 2022-11-24 NOTE — Telephone Encounter (Signed)
Pt is complaining of rt side jaw pain right under ear in that joint. Pain started last night and is progressively getting worse. Pt states its not her teeth.   Pt is wanting to know your recommendation for treatment.  Wendy Hubbard reply per April is to go to Urgent care or [facility] for evaluation.

## 2022-11-24 NOTE — ED Provider Notes (Signed)
[facility] Provider Note  Patient Contact: 6:16 PM (approximate)   History   Jaw Pain   HPI  Wendy Hubbard is a 67 y.o. female who comes to the emergency department complaining of right TMJ type pain.  Patient is having pain at the joint of the mandible.  There is been no trauma to the area.  She denies any dental pain, gingival irritation, swelling along the mandible.  Patient has no visual changes, states that the pain does radiate to her ear but has had no drainage from the ear, there is no tenderness to palpation of the ear.     Physical Exam   Triage Vital Signs: ED Triage Vitals  Encounter Vitals Group     BP 11/24/22 1511 136/81     Systolic BP Percentile --      Diastolic BP Percentile --      Pulse Rate 11/24/22 1511 82     Resp 11/24/22 1511 18     Temp 11/24/22 1511 98.1 F (36.7 C)     Temp Source 11/24/22 1511 Oral     SpO2 11/24/22 1511 96 %     Weight 11/24/22 1512 195 lb (88.5 kg)     Height 11/24/22 1512 5\' 4"  (1.626 m)     Head Circumference --      Peak Flow --      Pain Score 11/24/22 1512 7     Pain Loc --      Pain Education --      Exclude from Growth Chart --     Most recent vital signs: Vitals:   11/24/22 1511  BP: 136/81  Pulse: 82  Resp: 18  Temp: 98.1 F (36.7 C)  SpO2: 96%     General: Alert and in no acute distress. Head: No acute traumatic findings ENT:      Ears: EAC and unremarkable bilaterally.      Nose: No congestion/rhinnorhea.      Mouth/Throat: Mucous membranes are moist.  No oropharyngeal erythema, edema, dental trauma Neck: No stridor. No cervical spine tenderness to palpation.  Cardiovascular:  Good peripheral perfusion Respiratory: Normal respiratory effort without tachypnea or retractions. Lungs CTAB. Musculoskeletal: Full range of motion to all extremities.  Neurologic:  No gross focal neurologic deficits are appreciated.  Skin:   No rash noted Other:   ED Results / Procedures /  Treatments   Labs (all labs ordered are listed, but only abnormal results are displayed) Labs Reviewed - No data to display   EKG     RADIOLOGY    No results found.  PROCEDURES:  Critical Care performed: No  Procedures   MEDICATIONS ORDERED IN ED: Medications  ketorolac (TORADOL) 30 MG/ML injection 30 mg (30 mg Intramuscular Given 11/24/22 1836)  dexamethasone (DECADRON) injection 10 mg (10 mg Intramuscular Given 11/24/22 1836)     IMPRESSION / MDM / ASSESSMENT AND PLAN / ED COURSE  I reviewed the triage vital signs and the nursing notes.                                 Differential diagnosis includes, but is not limited to, TMJ syndrome, dental infection, otitis media, otitis externa, trigeminal neuralgia   Patient's presentation is most consistent with acute presentation with potential threat to life or bodily function.   Patient's diagnosis is consistent with TMJ pain.  Patient presents emergency department with right-sided TMJ  pain.  Tender over this area with no other tenderness to palpation.  Visualization of dentition, ENT structures reveals no concerning findings.  No tenderness over the temple with palpable cords or lesions to be concern for temporal arteritis.  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  At this time we will treat with anti-inflammatories and very limited pain medication for the patient.  The patient has other concerning symptoms such as facial droop, visual changes, or signs of dental infection she may follow-up here or with her primary care..  Patient is given ED precautions to return to the ED for any worsening or new symptoms.     FINAL CLINICAL IMPRESSION(S) / ED DIAGNOSES   Final diagnoses:  Arthralgia of right temporomandibular joint     Rx / DC Orders   ED Discharge Orders          Ordered    predniSONE (DELTASONE) 10 MG tablet  As directed,   Status:  Discontinued       Note to Pharmacy: Take on a pattern of 6, 6, 5, 5, 4, 4, 3, 3, 2, 2, 1,  1   11/24/22 1822    predniSONE (DELTASONE) 10 MG tablet  As directed       Note to Pharmacy: Take on a pattern of 6, 6, 5, 5, 4, 4, 3, 3, 2, 2, 1, 1   11/24/22 1822    HYDROcodone-acetaminophen (NORCO/VICODIN) 5-325 MG tablet  Every 4 hours PRN        11/24/22 1822             Note:  This document was prepared using Dragon voice recognition software and may include unintentional dictation errors.   Racheal Patches, PA-C 11/24/22 1843    Trinna Post, MD 11/24/22 [phone]

## 2022-11-28 ENCOUNTER — Other Ambulatory Visit: Payer: Self-pay | Admitting: Nurse Practitioner

## 2022-11-28 DIAGNOSIS — E782 Mixed hyperlipidemia: Secondary | ICD-10-CM

## 2022-11-28 DIAGNOSIS — M792 Neuralgia and neuritis, unspecified: Secondary | ICD-10-CM

## 2022-11-28 DIAGNOSIS — G629 Polyneuropathy, unspecified: Secondary | ICD-10-CM

## 2022-11-28 DIAGNOSIS — F411 Generalized anxiety disorder: Secondary | ICD-10-CM

## 2022-11-28 DIAGNOSIS — M26609 Unspecified temporomandibular joint disorder, unspecified side: Secondary | ICD-10-CM

## 2022-11-28 HISTORY — DX: Unspecified temporomandibular joint disorder, unspecified side: M26.609

## 2022-11-30 NOTE — Progress Notes (Unsigned)
PROVIDER NOTE: Information contained herein reflects review and annotations entered in association with encounter. Interpretation of such information and data should be left to medically-trained personnel. Information provided to patient can be located elsewhere in the medical record under "Patient Instructions". Document created using STT-dictation technology, any transcriptional errors that may result from process are unintentional.    Patient: Wendy Hubbard  Service Category: E/M  Provider: Oswaldo Done, MD  DOB: March 27, 1956  DOS: 12/01/2022  Referring Provider: Carlean Jews, NP  MRN: [id number]  Specialty: Interventional Pain Management  PCP: Melida Quitter, PA  Type: Established Patient  Setting: Ambulatory outpatient    Location: Office  Delivery: Face-to-face     HPI  Wendy Hubbard, a 67 y.o. year old female, is here today because of her No primary diagnosis found.. Wendy Hubbard primary complain today is No chief complaint on file.  Pertinent problems: Wendy Hubbard has Chronic knee pain (2ry area of Pain) (Left); Primary osteoarthritis of left knee; Chronic feet pain (1ry area of Pain) (Bilateral); Peripheral neuropathic pain; Migraine without aura and without status migrainosus, not intractable; Rhomboid muscle pain; Intermittent claudication (HCC); Restless legs syndrome; Other intervertebral disc degeneration, lumbar region; Spondylolisthesis of lumbar region; Generalized abdominal pain; Chronic shoulder pain (5th area of Pain) (Bilateral) (L>R); Sensorimotor neuropathy; Acute pain of right shoulder; Chronic neck pain (4th area of Pain) (Bilateral) (L>R); Mass of lower outer quadrant of left breast; Shoulder blade pain; Pain in joint of right shoulder; Chronic pain syndrome; Abnormal NCS (nerve conduction studies) (06/14/2020); Abnormal MRI, lumbar spine (02/25/2022); Abnormal CT scan, lumbar spine (06/25/2022); Abnormal MRI, cervical spine (12/14/2019); Chronic low back pain (3ry area of  Pain) (Bilateral) (L>R) w/o sciatica; Chronic occipital headache (Left); Cervicogenic headache (Left); Injury of right middle finger; Chronic upper extremity pain (7th area of Pain) (Left); Chronic painful diabetic neuropathy (HCC); Lumbar facet joint pain; Lumbosacral facet arthropathy (Multilevel) (Bilateral); and Spondylosis without myelopathy or radiculopathy, lumbosacral region on their pertinent problem list. Pain Assessment: Severity of   is reported as a  /10. Location:    / . Onset:  . Quality:  . Timing:  . Modifying factor(s):  Marland Kitchen Vitals:  vitals were not taken for this visit.  BMI: Estimated body mass index is 33.47 kg/m as calculated from the following:   Height as of 11/24/22: 5\' 4"  (1.626 m).   Weight as of 11/24/22: 195 lb (88.5 kg). Last encounter: 10/21/2022. Last procedure: 11/05/2022.  Reason for encounter: post-procedure evaluation and assessment. ***  Post-procedure evaluation   Procedure: Lumbar Facet, Medial Branch Block(s) #1 (NO STEROIDS) Laterality: Bilateral  Level: L2, L3, L4, L5, and S1 Medial Branch Level(s). Injecting these levels blocks the L3-4, L4-5, and L5-S1 lumbar facet joints. Imaging: Fluoroscopic guidance Spinal ([id number]) Anesthesia: Local anesthesia (1-2% Lidocaine) Anxiolysis: IV Versed 2.0 mg Sedation: Moderate Sedation Fentanyl 1 mL (50 mcg) DOS: 11/05/2022 Performed by: Oswaldo Done, MD  Primary Purpose: Diagnostic/Therapeutic Indications: Low back pain severe enough to impact quality of life or function. 1. Lumbar facet joint pain   2. Lumbosacral facet arthropathy (Multilevel) (Bilateral)   3. Chronic low back pain (3ry area of Pain) (Bilateral) (L>R) w/o sciatica   4. Spondylosis without myelopathy or radiculopathy, lumbosacral region    NAS-11 Pain score:   Pre-procedure: 3 /10   Post-procedure: 0-No pain/10      Effectiveness:  Initial hour after procedure:   ***. Subsequent 4-6 hours post-procedure:   ***. Analgesia past  initial 6 hours:   ***.  Ongoing improvement:  Analgesic:  *** Function:    ***    ROM:    ***     Pharmacotherapy Assessment  Analgesic: oxycodone/APAP 5/325, 1 tab p.o. 4 times daily (# 20) (last filled on 06/25/2022) MME/day: 30 mg/day   Monitoring: [place] PMP: PDMP reviewed during this encounter.       Pharmacotherapy: No side-effects or adverse reactions reported. Compliance: No problems identified. Effectiveness: Clinically acceptable.  No notes on file  No results found for: "CBDTHCR" No results found for: "D8THCCBX" No results found for: "D9THCCBX"  UDS:  Summary  Date Value Ref Range Status  08/05/2022 Note  Final    Comment:    ==================================================================== Compliance Drug Analysis, Ur ==================================================================== Test                             Result       Flag       Units  Drug Present and Declared for Prescription Verification   Pregabalin                     PRESENT      EXPECTED   Methocarbamol                  PRESENT      EXPECTED   Citalopram                     PRESENT      EXPECTED   Desmethylcitalopram            PRESENT      EXPECTED    Desmethylcitalopram is an expected metabolite of citalopram or the    enantiomeric form, escitalopram.    Duloxetine                     PRESENT      EXPECTED   Mirtazapine                    PRESENT      EXPECTED   Metoprolol                     PRESENT      EXPECTED  Drug Absent but Declared for Prescription Verification   Alprazolam                     Not Detected UNEXPECTED ng/mg creat   Oxycodone                      Not Detected UNEXPECTED ng/mg creat ==================================================================== Test                      Result    Flag   Units      Ref Range   Creatinine              187              mg/dL      >=16 ==================================================================== Declared Medications:   The flagging and interpretation on this report are based on the  following declared medications.  Unexpected results may arise from  inaccuracies in the declared medications.   **Note: The testing scope of this panel includes these medications:   Alprazolam (Xanax)  Duloxetine  Escitalopram (Lexapro)  Methocarbamol (Robaxin)  Metoprolol (Toprol)  Mirtazapine (Remeron)  Oxycodone  Pregabalin (Lyrica)   **  Note: The testing scope of this panel does not include the  following reported medications:   Alosetron  Cilostazol (Pletal)  Furosemide (Lasix)  Loperamide (Imodium)  Montelukast (Singulair)  Multivitamin  Pantoprazole (Protonix)  Ropinirole (Requip)  Rosuvastatin (Crestor)  Semaglutide ==================================================================== For clinical consultation, please call [phone]. ====================================================================       ROS  Constitutional: Denies any fever or chills Gastrointestinal: No reported hemesis, hematochezia, vomiting, or acute GI distress Musculoskeletal: Denies any acute onset joint swelling, redness, loss of ROM, or weakness Neurological: No reported episodes of acute onset apraxia, aphasia, dysarthria, agnosia, amnesia, paralysis, loss of coordination, or loss of consciousness  Medication Review  ALPRAZolam, DULoxetine, HYDROcodone-acetaminophen, Multiple Vitamins-Minerals, Semaglutide-Weight Management, alosetron, cilostazol, cyanocobalamin, desloratadine, escitalopram, furosemide, loperamide, methocarbamol, metoprolol succinate, mirtazapine, montelukast, pantoprazole, predniSONE, pregabalin, rOPINIRole, and rosuvastatin  History Review  Allergy: Wendy Hubbard is allergic to flagyl [metronidazole], penicillins, and doxycycline. Drug: Wendy Hubbard  reports no history of drug use. Alcohol:  reports current alcohol use of about 2.0 standard drinks of alcohol per week. Tobacco:  reports that she quit  smoking about 28 years ago. Her smoking use included cigarettes. She started smoking about 48 years ago. She has never used smokeless tobacco. Social: Ms. Hon  reports that she quit smoking about 28 years ago. Her smoking use included cigarettes. She started smoking about 48 years ago. She has never used smokeless tobacco. She reports current alcohol use of about 2.0 standard drinks of alcohol per week. She reports that she does not use drugs. Medical:  has a past medical history of Allergy, Anxiety, COPD (chronic obstructive pulmonary disease) (HCC), DDD (degenerative disc disease), thoracic, Depression, Diabetes mellitus without complication (HCC), GERD (gastroesophageal reflux disease), Hyperlipidemia, Nausea and vomiting (03/04/2022), Neuromuscular disorder (HCC), Pneumonia, Rapid heart rate, Sleep apnea, and Wears contact lenses. Surgical: Wendy Hubbard  has a past surgical history that includes Tonsillectomy; Abdominal hysterectomy; Breast surgery; Cholecystectomy; Appendectomy; Ankle arthroscopy (Bilateral); Knee arthroscopy (Left); Bladder suspension; Esophagogastroduodenoscopy (N/A, 11/02/2015); Colonoscopy (N/A, 11/04/2015); Reduction mammaplasty (Bilateral, 1988); Breast excisional biopsy (Left, 1990); Breast excisional biopsy (Right, 1989); Colonoscopy with propofol (N/A, 08/30/2020); polypectomy (08/30/2020); Gastrojejunostomy (N/A, 08/25/2021); Partial gastrectomy (N/A, 08/25/2021); Upper gi endoscopy (08/25/2021); and Esophagogastroduodenoscopy (egd) with propofol (N/A, 03/16/2022). Family: family history includes Bladder Cancer in her mother; Cancer in her mother; Heart attack in her father; High Cholesterol in her mother; High blood pressure in her father and mother; Hypertension in an other family member.  Laboratory Chemistry Profile   Renal Lab Results  Component Value Date   BUN 13 07/22/2022   CREATININE 0.88 07/22/2022   BCR 15 07/22/2022   GFRAA 97 03/07/2020   GFRNONAA >60 08/28/2021     Hepatic Lab Results  Component Value Date   AST 13 07/22/2022   ALT 12 07/22/2022   ALBUMIN 4.4 07/22/2022   ALKPHOS 174 (H) 07/22/2022   LIPASE 44 11/01/2015    Electrolytes Lab Results  Component Value Date   NA 142 07/22/2022   K 4.5 07/22/2022   CL 103 07/22/2022   CALCIUM 9.4 07/22/2022   MG 2.2 08/27/2021    Bone Lab Results  Component Value Date   VD25OH 24.4 (L) 07/22/2022    Inflammation (CRP: Acute Phase) (ESR: Chronic Phase) Lab Results  Component Value Date   CRP 3 08/05/2022   ESRSEDRATE 6 08/05/2022   LATICACIDVEN 1.7 02/26/2020         Note: Above Lab results reviewed.  Recent Imaging Review  DG PAIN CLINIC C-ARM 1-60 MIN NO  REPORT Fluoro was used, but no Radiologist interpretation will be provided.  Please refer to "NOTES" tab for provider progress note. Note: Reviewed        Physical Exam  General appearance: Well nourished, well developed, and well hydrated. In no apparent acute distress Mental status: Alert, oriented x 3 (person, place, & time)       Respiratory: No evidence of acute respiratory distress Eyes: PERLA Vitals: There were no vitals taken for this visit. BMI: Estimated body mass index is 33.47 kg/m as calculated from the following:   Height as of 11/24/22: 5\' 4"  (1.626 m).   Weight as of 11/24/22: 195 lb (88.5 kg). Ideal: Ideal body weight: 54.7 kg (120 lb 9.5 oz) Adjusted ideal body weight: 68.2 kg (150 lb 5.7 oz)  Assessment   Diagnosis Status  No diagnosis found. Controlled Controlled Controlled   Updated Problems: No problems updated.  Plan of Care  Problem-specific:  No problem-specific Assessment & Plan notes found for this encounter.  Wendy Hubbard has a current medication list which includes the following [place]-term medication(s): alosetron, desloratadine, duloxetine, escitalopram, furosemide, metoprolol succinate, mirtazapine, montelukast, pantoprazole, pregabalin, ropinirole, and  rosuvastatin.  Pharmacotherapy (Medications Ordered): No orders of the defined types were placed in this encounter.  Orders:  No orders of the defined types were placed in this encounter.  Follow-up plan:   No follow-ups on file.      Interventional Therapies  Risk Factors  Considerations:     Planned  Pending:   Diagnostic bilateral lumbar facet MBB #1 (NO STEROIDS)    Under consideration:   Diagnostic bilateral lumbar facet MBB #1 (NO STEROIDS)    Completed:   Therapeutic bilateral Qutenza treatment x1 (02/17/09/10)    Completed by other providers:   None reported   Therapeutic  Palliative (PRN) options:   None established          Recent Visits Date Type Provider Dept  11/05/22 Procedure visit Delano Metz, MD [facility]  10/21/22 Office Visit Delano Metz, MD [facility]  Showing recent visits within past 90 days and meeting all other requirements Future Appointments Date Type Provider Dept  12/01/22 Appointment Delano Metz, MD [facility]  Showing future appointments within next 90 days and meeting all other requirements  I discussed the assessment and treatment plan with the patient. The patient was provided an opportunity to ask questions and all were answered. The patient agreed with the plan and demonstrated an understanding of the instructions.  Patient advised to call back or seek an in-person evaluation if the symptoms or condition worsens.  Duration of encounter: *** minutes.  Total time on encounter, as per AMA guidelines included both the face-to-face and non-face-to-face time personally spent by the physician and/or other qualified health care professional(s) on the day of the encounter (includes time in activities that require the physician or other qualified health care professional and does not include time in activities normally performed by clinical staff). Physician's time may include the  following activities when performed: Preparing to see the patient (e.g., pre-charting review of records, searching for previously ordered imaging, lab work, and nerve conduction tests) Review of prior analgesic pharmacotherapies. Reviewing PMP Interpreting ordered tests (e.g., lab work, imaging, nerve conduction tests) Performing post-procedure evaluations, including interpretation of diagnostic procedures Obtaining and/or reviewing separately obtained history Performing a medically appropriate examination and/or evaluation Counseling and educating the patient/family/caregiver Ordering medications, tests, or procedures Referring and communicating with other health care professionals (  when not separately reported) Documenting clinical information in the electronic or other health record Independently interpreting results (not separately reported) and communicating results to the patient/ family/caregiver Care coordination (not separately reported)  Note by: Oswaldo Done, MD Date: 12/01/2022; Time: 4:42 PM

## 2022-12-01 ENCOUNTER — Encounter: Payer: Self-pay | Admitting: Pain Medicine

## 2022-12-01 ENCOUNTER — Ambulatory Visit (HOSPITAL_BASED_OUTPATIENT_CLINIC_OR_DEPARTMENT_OTHER): Payer: Medicare Other | Admitting: Pain Medicine

## 2022-12-01 DIAGNOSIS — Z91199 Patient's noncompliance with other medical treatment and regimen due to unspecified reason: Secondary | ICD-10-CM

## 2022-12-01 DIAGNOSIS — Z09 Encounter for follow-up examination after completed treatment for conditions other than malignant neoplasm: Secondary | ICD-10-CM

## 2022-12-03 ENCOUNTER — Other Ambulatory Visit: Payer: Self-pay | Admitting: Family Medicine

## 2022-12-03 DIAGNOSIS — M5441 Lumbago with sciatica, right side: Secondary | ICD-10-CM

## 2022-12-03 DIAGNOSIS — M7918 Myalgia, other site: Secondary | ICD-10-CM

## 2022-12-03 DIAGNOSIS — M542 Cervicalgia: Secondary | ICD-10-CM

## 2022-12-03 DIAGNOSIS — M26609 Unspecified temporomandibular joint disorder, unspecified side: Secondary | ICD-10-CM

## 2022-12-03 MED ORDER — METHOCARBAMOL 750 MG PO TABS
750.0000 mg | ORAL_TABLET | Freq: Three times a day (TID) | ORAL | 1 refills | Status: DC | PRN
Start: 2022-12-03 — End: 2023-02-17

## 2022-12-04 ENCOUNTER — Telehealth: Payer: Self-pay

## 2022-12-04 NOTE — Telephone Encounter (Signed)
Transition Care Management Unsuccessful Follow-up Telephone Call  Date of discharge and from where:  11/24/2022 [facility]  Attempts:  1st Attempt  Reason for unsuccessful TCM follow-up call:  No answer/busy  Blue Ruggerio [facility]  [facility] Population Health Community Resource Care Guide   [email or web address]  ?? [phone]   Website: [email or web address]  [email or web address]

## 2022-12-07 ENCOUNTER — Telehealth: Payer: Self-pay

## 2022-12-07 NOTE — Telephone Encounter (Signed)
Transition Care Management Unsuccessful Follow-up Telephone Call  Date of discharge and from where:  11/24/2022 [facility]  Attempts:  2nd Attempt  Reason for unsuccessful TCM follow-up call:  Left voice message  Zanayah Shadowens [facility]  [facility] Population Health Community Resource Care Guide   [email or web address]  ?? [phone]   Website: [email or web address]  [email or web address]

## 2022-12-08 ENCOUNTER — Encounter (INDEPENDENT_AMBULATORY_CARE_PROVIDER_SITE_OTHER): Payer: Medicare Other | Admitting: Family Medicine

## 2022-12-08 DIAGNOSIS — R051 Acute cough: Secondary | ICD-10-CM

## 2022-12-08 DIAGNOSIS — U071 COVID-19: Secondary | ICD-10-CM

## 2022-12-08 DIAGNOSIS — M5442 Lumbago with sciatica, left side: Secondary | ICD-10-CM

## 2022-12-08 DIAGNOSIS — E1142 Type 2 diabetes mellitus with diabetic polyneuropathy: Secondary | ICD-10-CM

## 2022-12-08 DIAGNOSIS — G894 Chronic pain syndrome: Secondary | ICD-10-CM

## 2022-12-14 NOTE — Progress Notes (Unsigned)
PROVIDER NOTE: Information contained herein reflects review and annotations entered in association with encounter. Interpretation of such information and data should be left to medically-trained personnel. Information provided to patient can be located elsewhere in the medical record under "Patient Instructions". Document created using STT-dictation technology, any transcriptional errors that may result from process are unintentional.    Patient: Wendy Hubbard  Service Category: E/M  Provider: Oswaldo Done, MD  DOB: 1955/08/04  DOS: 12/15/2022  Referring Provider: Melida Quitter, PA  MRN: [id number]  Specialty: Interventional Pain Management  PCP: Melida Quitter, PA  Type: Established Patient  Setting: Ambulatory outpatient    Location: Office  Delivery: Face-to-face     HPI  Wendy Hubbard, a 67 y.o. year old female, is here today because of her Chronic bilateral low back pain without sciatica [M54.50, G89.29]. Wendy Hubbard primary complain today is No chief complaint on file.  Pertinent problems: Wendy Hubbard has Chronic knee pain (2ry area of Pain) (Left); Primary osteoarthritis of left knee; Chronic feet pain (1ry area of Pain) (Bilateral); Peripheral neuropathic pain; Migraine without aura and without status migrainosus, not intractable; Rhomboid muscle pain; Type 2 diabetes mellitus with peripheral neuropathy (HCC); Intermittent claudication (HCC); Restless legs syndrome; Other intervertebral disc degeneration, lumbar region; Spondylolisthesis of lumbar region; Generalized abdominal pain; Chronic shoulder pain (5th area of Pain) (Bilateral) (L>R); Sensorimotor neuropathy; Acute pain of right shoulder; Chronic neck pain (4th area of Pain) (Bilateral) (L>R); Mass of lower outer quadrant of left breast; Shoulder blade pain; Pain in joint of right shoulder; Chronic pain syndrome; Abnormal NCS (nerve conduction studies) (06/14/2020); Abnormal MRI, lumbar spine (02/25/2022); Abnormal CT scan, lumbar  spine (06/25/2022); Abnormal MRI, cervical spine (12/14/2019); Chronic low back pain (3ry area of Pain) (Bilateral) (L>R) w/o sciatica; Chronic occipital headache (Left); Cervicogenic headache (Left); Injury of right middle finger; Chronic upper extremity pain (7th area of Pain) (Left); Lumbar facet joint pain; Lumbosacral facet arthropathy (Multilevel) (Bilateral); and Spondylosis without myelopathy or radiculopathy, lumbosacral region on their pertinent problem list. Pain Assessment: Severity of   is reported as a  /10. Location:    / . Onset:  . Quality:  . Timing:  . Modifying factor(s):  Marland Kitchen Vitals:  vitals were not taken for this visit.  BMI: Estimated body mass index is 33.47 kg/m as calculated from the following:   Height as of 11/24/22: 5\' 4"  (1.626 m).   Weight as of 11/24/22: 195 lb (88.5 kg). Last encounter: 12/01/2022. Last procedure: 11/05/2022.  Reason for encounter: post-procedure evaluation and assessment. ***  Post-procedure evaluation   Procedure: Lumbar Facet, Medial Branch Block(s) #1 (NO STEROIDS) Laterality: Bilateral  Level: L2, L3, L4, L5, and S1 Medial Branch Level(s). Injecting these levels blocks the L3-4, L4-5, and L5-S1 lumbar facet joints. Imaging: Fluoroscopic guidance Spinal ([id number]) Anesthesia: Local anesthesia (1-2% Lidocaine) Anxiolysis: IV Versed 2.0 mg Sedation: Moderate Sedation Fentanyl 1 mL (50 mcg) DOS: 11/05/2022 Performed by: Oswaldo Done, MD  Primary Purpose: Diagnostic/Therapeutic Indications: Low back pain severe enough to impact quality of life or function. 1. Lumbar facet joint pain   2. Lumbosacral facet arthropathy (Multilevel) (Bilateral)   3. Chronic low back pain (3ry area of Pain) (Bilateral) (L>R) w/o sciatica   4. Spondylosis without myelopathy or radiculopathy, lumbosacral region    NAS-11 Pain score:   Pre-procedure: 3 /10   Post-procedure: 0-No pain/10      Effectiveness:  Initial hour after procedure:    ***. Subsequent 4-6 hours post-procedure:   ***.  Analgesia past initial 6 hours:   ***. Ongoing improvement:  Analgesic:  *** Function:    ***    ROM:    ***     Pharmacotherapy Assessment  Analgesic: oxycodone/APAP 5/325, 1 tab p.o. 4 times daily (# 20) (last filled on 06/25/2022) MME/day: 30 mg/day   Monitoring: [place] PMP: PDMP reviewed during this encounter.       Pharmacotherapy: No side-effects or adverse reactions reported. Compliance: No problems identified. Effectiveness: Clinically acceptable.  No notes on file  No results found for: "CBDTHCR" No results found for: "D8THCCBX" No results found for: "D9THCCBX"  UDS:  Summary  Date Value Ref Range Status  08/05/2022 Note  Final    Comment:    ==================================================================== Compliance Drug Analysis, Ur ==================================================================== Test                             Result       Flag       Units  Drug Present and Declared for Prescription Verification   Pregabalin                     PRESENT      EXPECTED   Methocarbamol                  PRESENT      EXPECTED   Citalopram                     PRESENT      EXPECTED   Desmethylcitalopram            PRESENT      EXPECTED    Desmethylcitalopram is an expected metabolite of citalopram or the    enantiomeric form, escitalopram.    Duloxetine                     PRESENT      EXPECTED   Mirtazapine                    PRESENT      EXPECTED   Metoprolol                     PRESENT      EXPECTED  Drug Absent but Declared for Prescription Verification   Alprazolam                     Not Detected UNEXPECTED ng/mg creat   Oxycodone                      Not Detected UNEXPECTED ng/mg creat ==================================================================== Test                      Result    Flag   Units      Ref Range   Creatinine              187              mg/dL       >=29 ==================================================================== Declared Medications:  The flagging and interpretation on this report are based on the  following declared medications.  Unexpected results may arise from  inaccuracies in the declared medications.   **Note: The testing scope of this panel includes these medications:   Alprazolam (Xanax)  Duloxetine  Escitalopram (Lexapro)  Methocarbamol (Robaxin)  Metoprolol (Toprol)  Mirtazapine (  Remeron)  Oxycodone  Pregabalin (Lyrica)   **Note: The testing scope of this panel does not include the  following reported medications:   Alosetron  Cilostazol (Pletal)  Furosemide (Lasix)  Loperamide (Imodium)  Montelukast (Singulair)  Multivitamin  Pantoprazole (Protonix)  Ropinirole (Requip)  Rosuvastatin (Crestor)  Semaglutide ==================================================================== For clinical consultation, please call [phone]. ====================================================================       ROS  Constitutional: Denies any fever or chills Gastrointestinal: No reported hemesis, hematochezia, vomiting, or acute GI distress Musculoskeletal: Denies any acute onset joint swelling, redness, loss of ROM, or weakness Neurological: No reported episodes of acute onset apraxia, aphasia, dysarthria, agnosia, amnesia, paralysis, loss of coordination, or loss of consciousness  Medication Review  ALPRAZolam, DULoxetine, HYDROcodone-acetaminophen, Multiple Vitamins-Minerals, Semaglutide-Weight Management, alosetron, cilostazol, cyanocobalamin, desloratadine, escitalopram, furosemide, loperamide, methocarbamol, metoprolol succinate, mirtazapine, montelukast, pantoprazole, predniSONE, pregabalin, rOPINIRole, and rosuvastatin  History Review  Allergy: Wendy Hubbard is allergic to flagyl [metronidazole], penicillins, and doxycycline. Drug: Wendy Hubbard  reports no history of drug use. Alcohol:  reports current  alcohol use of about 2.0 standard drinks of alcohol per week. Tobacco:  reports that she quit smoking about 28 years ago. Her smoking use included cigarettes. She started smoking about 48 years ago. She has never used smokeless tobacco. Social: Wendy Hubbard  reports that she quit smoking about 28 years ago. Her smoking use included cigarettes. She started smoking about 48 years ago. She has never used smokeless tobacco. She reports current alcohol use of about 2.0 standard drinks of alcohol per week. She reports that she does not use drugs. Medical:  has a past medical history of Allergy, Anxiety, COPD (chronic obstructive pulmonary disease) (HCC), DDD (degenerative disc disease), thoracic, Depression, Diabetes mellitus without complication (HCC), GERD (gastroesophageal reflux disease), Hyperlipidemia, Nausea and vomiting (03/04/2022), Neuromuscular disorder (HCC), Pneumonia, Rapid heart rate, Sleep apnea, and Wears contact lenses. Surgical: Ms. Korby  has a past surgical history that includes Tonsillectomy; Abdominal hysterectomy; Breast surgery; Cholecystectomy; Appendectomy; Ankle arthroscopy (Bilateral); Knee arthroscopy (Left); Bladder suspension; Esophagogastroduodenoscopy (N/A, 11/02/2015); Colonoscopy (N/A, 11/04/2015); Reduction mammaplasty (Bilateral, 1988); Breast excisional biopsy (Left, 1990); Breast excisional biopsy (Right, 1989); Colonoscopy with propofol (N/A, 08/30/2020); polypectomy (08/30/2020); Gastrojejunostomy (N/A, 08/25/2021); Partial gastrectomy (N/A, 08/25/2021); Upper gi endoscopy (08/25/2021); and Esophagogastroduodenoscopy (egd) with propofol (N/A, 03/16/2022). Family: family history includes Bladder Cancer in her mother; Cancer in her mother; Heart attack in her father; High Cholesterol in her mother; High blood pressure in her father and mother; Hypertension in an other family member.  Laboratory Chemistry Profile   Renal Lab Results  Component Value Date   BUN 13 07/22/2022    CREATININE 0.88 07/22/2022   BCR 15 07/22/2022   GFRAA 97 03/07/2020   GFRNONAA >60 08/28/2021    Hepatic Lab Results  Component Value Date   AST 13 07/22/2022   ALT 12 07/22/2022   ALBUMIN 4.4 07/22/2022   ALKPHOS 174 (H) 07/22/2022   LIPASE 44 11/01/2015    Electrolytes Lab Results  Component Value Date   NA 142 07/22/2022   K 4.5 07/22/2022   CL 103 07/22/2022   CALCIUM 9.4 07/22/2022   MG 2.2 08/27/2021    Bone Lab Results  Component Value Date   VD25OH 24.4 (L) 07/22/2022    Inflammation (CRP: Acute Phase) (ESR: Chronic Phase) Lab Results  Component Value Date   CRP 3 08/05/2022   ESRSEDRATE 6 08/05/2022   LATICACIDVEN 1.7 02/26/2020         Note: Above Lab results reviewed.  Recent Imaging Review  DG PAIN CLINIC C-ARM 1-60 MIN NO REPORT Fluoro was used, but no Radiologist interpretation will be provided.  Please refer to "NOTES" tab for provider progress note. Note: Reviewed        Physical Exam  General appearance: Well nourished, well developed, and well hydrated. In no apparent acute distress Mental status: Alert, oriented x 3 (person, place, & time)       Respiratory: No evidence of acute respiratory distress Eyes: PERLA Vitals: There were no vitals taken for this visit. BMI: Estimated body mass index is 33.47 kg/m as calculated from the following:   Height as of 11/24/22: 5\' 4"  (1.626 m).   Weight as of 11/24/22: 195 lb (88.5 kg). Ideal: Patient weight not recorded  Assessment   Diagnosis Status  1. Chronic low back pain (3ry area of Pain) (Bilateral) (L>R) w/o sciatica   2. Lumbar facet joint pain   3. Chronic knee pain (2ry area of Pain) (Left)   4. Postop check    Controlled Controlled Controlled   Updated Problems: No problems updated.  Plan of Care  Problem-specific:  No problem-specific Assessment & Plan notes found for this encounter.  Wendy Hubbard has a current medication list which includes the following [place]-term  medication(s): alosetron, desloratadine, duloxetine, escitalopram, furosemide, metoprolol succinate, mirtazapine, montelukast, pantoprazole, pregabalin, ropinirole, and rosuvastatin.  Pharmacotherapy (Medications Ordered): No orders of the defined types were placed in this encounter.  Orders:  No orders of the defined types were placed in this encounter.  Follow-up plan:   No follow-ups on file.      Interventional Therapies  Risk Factors  Considerations:     Planned  Pending:   Diagnostic bilateral lumbar facet MBB #1 (NO STEROIDS)    Under consideration:   Diagnostic bilateral lumbar facet MBB #1 (NO STEROIDS)    Completed:   Therapeutic bilateral Qutenza treatment x1 (02/17/09/10)    Completed by other providers:   None reported   Therapeutic  Palliative (PRN) options:   None established          Recent Visits Date Type Provider Dept  11/05/22 Procedure visit Delano Metz, MD [facility]  10/21/22 Office Visit Delano Metz, MD [facility]  Showing recent visits within past 90 days and meeting all other requirements Future Appointments Date Type Provider Dept  12/15/22 Appointment Delano Metz, MD [facility]  Showing future appointments within next 90 days and meeting all other requirements  I discussed the assessment and treatment plan with the patient. The patient was provided an opportunity to ask questions and all were answered. The patient agreed with the plan and demonstrated an understanding of the instructions.  Patient advised to call back or seek an in-person evaluation if the symptoms or condition worsens.  Duration of encounter: *** minutes.  Total time on encounter, as per AMA guidelines included both the face-to-face and non-face-to-face time personally spent by the physician and/or other qualified health care professional(s) on the day of the encounter (includes time in activities that require the  physician or other qualified health care professional and does not include time in activities normally performed by clinical staff). Physician's time may include the following activities when performed: Preparing to see the patient (e.g., pre-charting review of records, searching for previously ordered imaging, lab work, and nerve conduction tests) Review of prior analgesic pharmacotherapies. Reviewing PMP Interpreting ordered tests (e.g., lab work, imaging, nerve conduction tests) Performing post-procedure evaluations, including interpretation of diagnostic procedures Obtaining and/or reviewing  separately obtained history Performing a medically appropriate examination and/or evaluation Counseling and educating the patient/family/caregiver Ordering medications, tests, or procedures Referring and communicating with other health care professionals (when not separately reported) Documenting clinical information in the electronic or other health record Independently interpreting results (not separately reported) and communicating results to the patient/ family/caregiver Care coordination (not separately reported)  Note by: Oswaldo Done, MD Date: 12/15/2022; Time: 8:14 PM

## 2022-12-15 ENCOUNTER — Ambulatory Visit: Payer: Medicare Other | Attending: Pain Medicine | Admitting: Pain Medicine

## 2022-12-15 ENCOUNTER — Encounter: Payer: Self-pay | Admitting: Pain Medicine

## 2022-12-15 VITALS — BP 151/73 | HR 85 | Temp 97.4°F | Ht 64.0 in | Wt 195.0 lb

## 2022-12-15 DIAGNOSIS — M25562 Pain in left knee: Secondary | ICD-10-CM | POA: Insufficient documentation

## 2022-12-15 DIAGNOSIS — M26609 Unspecified temporomandibular joint disorder, unspecified side: Secondary | ICD-10-CM | POA: Diagnosis not present

## 2022-12-15 DIAGNOSIS — G8929 Other chronic pain: Secondary | ICD-10-CM

## 2022-12-15 DIAGNOSIS — Z09 Encounter for follow-up examination after completed treatment for conditions other than malignant neoplasm: Secondary | ICD-10-CM | POA: Diagnosis not present

## 2022-12-15 DIAGNOSIS — M545 Low back pain, unspecified: Secondary | ICD-10-CM

## 2022-12-15 DIAGNOSIS — M5459 Other low back pain: Secondary | ICD-10-CM

## 2022-12-15 DIAGNOSIS — E1142 Type 2 diabetes mellitus with diabetic polyneuropathy: Secondary | ICD-10-CM | POA: Diagnosis not present

## 2022-12-15 NOTE — Patient Instructions (Addendum)
OTC Recommendations: Consider taking over-the-counter supplements such as: Turmeric/curcumin*(anti-inflammatory) Glucosamine/chondroitin (triple strength)*(may help prevent loss of articular cartilage) Vitamin D*(may have the ability to suppress release of chemicals associated with inflammation) Moringa*(anti-inflammatory with mild analgesic effects) ALA* (Alpha-Lipoic-Acid) 600 mg (Helps with neuropathies) (*Always use manufacturer's recommended dosage.)    Per MD gave tp information on [facility] HFX neropathy brochure

## 2022-12-15 NOTE — Progress Notes (Unsigned)
Safety precautions to be maintained throughout the outpatient stay will include: orient to surroundings, keep bed in low position, maintain call bell within reach at all times, provide assistance with transfer out of bed and ambulation.  

## 2022-12-16 ENCOUNTER — Telehealth: Payer: Self-pay | Admitting: Pain Medicine

## 2022-12-16 ENCOUNTER — Other Ambulatory Visit: Payer: Self-pay | Admitting: Pain Medicine

## 2022-12-16 DIAGNOSIS — G8929 Other chronic pain: Secondary | ICD-10-CM

## 2022-12-16 DIAGNOSIS — G894 Chronic pain syndrome: Secondary | ICD-10-CM

## 2022-12-16 NOTE — Telephone Encounter (Signed)
PT was seen on 12-15-22 , PT called back states that she has thought about the SCS and will like to move forward with getting an appt. PT states that Laban Emperor told her to just give office a call.

## 2022-12-16 NOTE — Progress Notes (Signed)
After careful consideration the patient would like to be evaluated for possible spinal cord stimulator trial.

## 2022-12-16 NOTE — Telephone Encounter (Signed)
She will need an eval appt so that Dr. Laban Emperor may order the pre requisites for SCS.

## 2022-12-23 MED ORDER — LANCETS MISC. MISC
1.0000 | Freq: Every day | 0 refills | Status: AC
Start: 2022-12-23 — End: ?

## 2022-12-23 MED ORDER — BLOOD GLUCOSE TEST VI STRP
1.0000 | ORAL_STRIP | Freq: Every day | 1 refills | Status: AC
Start: 2022-12-23 — End: ?

## 2022-12-23 MED ORDER — LANCET DEVICE MISC
1.0000 | Freq: Every day | 0 refills | Status: AC
Start: 2022-12-23 — End: ?

## 2022-12-23 MED ORDER — BLOOD GLUCOSE MONITORING SUPPL DEVI
1.0000 | Freq: Three times a day (TID) | 0 refills | Status: AC
Start: 2022-12-23 — End: ?

## 2022-12-23 NOTE — Addendum Note (Signed)
Addended by: Tonny Bollman on: 12/23/2022 01:12 PM   Modules accepted: Orders

## 2022-12-30 DIAGNOSIS — S93602A Unspecified sprain of left foot, initial encounter: Secondary | ICD-10-CM | POA: Diagnosis not present

## 2022-12-30 DIAGNOSIS — M79672 Pain in left foot: Secondary | ICD-10-CM | POA: Diagnosis not present

## 2022-12-30 DIAGNOSIS — M7989 Other specified soft tissue disorders: Secondary | ICD-10-CM | POA: Diagnosis not present

## 2023-01-04 ENCOUNTER — Other Ambulatory Visit: Payer: Self-pay | Admitting: Nurse Practitioner

## 2023-01-04 DIAGNOSIS — R002 Palpitations: Secondary | ICD-10-CM

## 2023-01-04 DIAGNOSIS — I10 Essential (primary) hypertension: Secondary | ICD-10-CM

## 2023-01-04 MED ORDER — FLUTICASONE PROPIONATE 50 MCG/ACT NA SUSP
2.0000 | Freq: Every day | NASAL | 6 refills | Status: DC
Start: 2023-01-04 — End: 2023-01-26

## 2023-01-04 MED ORDER — GUAIFENESIN ER 600 MG PO TB12
600.0000 mg | ORAL_TABLET | Freq: Two times a day (BID) | ORAL | 0 refills | Status: AC
Start: 2023-01-04 — End: 2023-01-14

## 2023-01-04 MED ORDER — CETIRIZINE HCL 10 MG PO TABS
10.0000 mg | ORAL_TABLET | Freq: Every day | ORAL | 0 refills | Status: DC
Start: 2023-01-04 — End: 2023-01-26

## 2023-01-04 NOTE — Telephone Encounter (Signed)
Please see the MyChart message reply(ies) for my assessment and plan.    This patient gave consent for this Medical Advice Message and is aware that it may result in a bill to [employer], as well as the possibility of receiving a bill for a co-payment or deductible. They are an established patient, but are not seeking medical advice exclusively about a problem treated during an in person or video visit in the last seven days. I did not recommend an in person or video visit within seven days of my reply.    I spent a total of 15 minutes cumulative time within 7 days through [employer].  Velva Harman, PA

## 2023-01-04 NOTE — Addendum Note (Signed)
Addended by: Saralyn Pilar on: 01/04/2023 04:13 PM   Modules accepted: Orders

## 2023-01-05 ENCOUNTER — Telehealth: Payer: Self-pay

## 2023-01-05 ENCOUNTER — Ambulatory Visit: Payer: Medicare Other

## 2023-01-05 NOTE — Telephone Encounter (Signed)
 Contacted patient on preferred number listed in notes for scheduled AWV. Patient unable to complete visit today will call back to reschedule.

## 2023-01-06 DIAGNOSIS — M79672 Pain in left foot: Secondary | ICD-10-CM | POA: Diagnosis not present

## 2023-01-26 ENCOUNTER — Encounter: Payer: Self-pay | Admitting: Family Medicine

## 2023-01-26 ENCOUNTER — Ambulatory Visit (INDEPENDENT_AMBULATORY_CARE_PROVIDER_SITE_OTHER): Payer: Medicare Other | Admitting: Family Medicine

## 2023-01-26 VITALS — BP 120/70 | HR 84 | Resp 18 | Ht 64.0 in | Wt 212.0 lb

## 2023-01-26 DIAGNOSIS — F411 Generalized anxiety disorder: Secondary | ICD-10-CM

## 2023-01-26 DIAGNOSIS — M25551 Pain in right hip: Secondary | ICD-10-CM | POA: Diagnosis not present

## 2023-01-26 DIAGNOSIS — I10 Essential (primary) hypertension: Secondary | ICD-10-CM

## 2023-01-26 DIAGNOSIS — M792 Neuralgia and neuritis, unspecified: Secondary | ICD-10-CM | POA: Diagnosis not present

## 2023-01-26 DIAGNOSIS — E785 Hyperlipidemia, unspecified: Secondary | ICD-10-CM

## 2023-01-26 DIAGNOSIS — Z1159 Encounter for screening for other viral diseases: Secondary | ICD-10-CM

## 2023-01-26 DIAGNOSIS — Z23 Encounter for immunization: Secondary | ICD-10-CM

## 2023-01-26 DIAGNOSIS — E1142 Type 2 diabetes mellitus with diabetic polyneuropathy: Secondary | ICD-10-CM

## 2023-01-26 DIAGNOSIS — E1169 Type 2 diabetes mellitus with other specified complication: Secondary | ICD-10-CM | POA: Diagnosis not present

## 2023-01-26 DIAGNOSIS — F3341 Major depressive disorder, recurrent, in partial remission: Secondary | ICD-10-CM

## 2023-01-26 DIAGNOSIS — M533 Sacrococcygeal disorders, not elsewhere classified: Secondary | ICD-10-CM | POA: Diagnosis not present

## 2023-01-26 MED ORDER — ESCITALOPRAM OXALATE 10 MG PO TABS
10.0000 mg | ORAL_TABLET | Freq: Every day | ORAL | 1 refills | Status: DC
Start: 2023-01-26 — End: 2023-07-12

## 2023-01-26 MED ORDER — DULOXETINE HCL 20 MG PO CPEP
20.0000 mg | ORAL_CAPSULE | Freq: Every day | ORAL | 1 refills | Status: DC
Start: 2023-01-26 — End: 2023-11-08

## 2023-01-26 MED ORDER — ROSUVASTATIN CALCIUM 10 MG PO TABS
10.0000 mg | ORAL_TABLET | Freq: Every day | ORAL | 1 refills | Status: DC
Start: 2023-01-26 — End: 2023-10-01

## 2023-01-26 MED ORDER — MIRTAZAPINE 15 MG PO TABS
15.0000 mg | ORAL_TABLET | Freq: Every day | ORAL | 1 refills | Status: DC
Start: 2023-01-26 — End: 2023-07-05

## 2023-01-26 MED ORDER — FUROSEMIDE 20 MG PO TABS: 20.0000 mg | ORAL_TABLET | Freq: Every day | ORAL | 1 refills | Status: DC

## 2023-01-26 NOTE — Progress Notes (Signed)
Established Patient Office Visit  Subjective   Patient ID: Wendy Hubbard, female    DOB: 1956-04-27  Age: 67 y.o. MRN: [id number]  Chief Complaint  Patient presents with   Diabetes    HPI Wendy Hubbard is a 67 y.o. female presenting today for follow up of hypertension, hyperlipidemia, diabetes.  She also complains of ongoing right hip and sacral pain.  She fell 1 month ago and landed on her right hip.  During that fall, she also broke a bone in her left foot and had to wear a boot for several weeks.  Her hip and sacrum started hurting within the last couple weeks and has gradually been worsening.  She never had imaging done on her pelvis after her initial fall. Hypertension: Patient here for follow-up of elevated blood pressure.  Pt denies chest pain, SOB, dizziness, edema, syncope, fatigue or heart palpitations. Taking metoprolol succinate, furosemide, reports excellent compliance with treatment. Denies side effects. Hyperlipidemia: tolerating rosuvastatin well with no myalgias or significant side effects.  The 10-year ASCVD risk score (Arnett DK, et al., 2019) is: 15.3% Diabetes: denies hypoglycemic events, wounds or sores that are not healing well, increased thirst or urination. Denies vision problems, eye exam coming up in the next couple of months.  Prescription was previously sent for [facility] but was not paid for by insurance as it is indicated for weight loss.  She has been managing with low-carb diet since that time.   Outpatient Medications Prior to Visit  Medication Sig   alosetron (LOTRONEX) 1 MG tablet Take 1 tablet (1 mg total) by mouth 2 (two) times daily.   ALPRAZolam (XANAX) 0.5 MG tablet TAKE 1/2 TO 1 TABLET BY MOUTH ONCE DAILYAS NEEDED FOR ACUTE ANXIETY   Blood Glucose Monitoring Suppl DEVI 1 each by Does not apply route in the morning, at noon, and at bedtime. May substitute to any manufacturer covered by patient's insurance.   cilostazol (PLETAL) 100 MG tablet Take 1 tablet  (100 mg total) by mouth 2 (two) times daily.   desloratadine (CLARINEX) 5 MG tablet Take 1 tablet (5 mg total) by mouth daily.   Glucose Blood (BLOOD GLUCOSE TEST STRIPS) STRP 1 each by In Vitro route daily. May substitute to any manufacturer covered by patient's insurance.   HYDROcodone-acetaminophen (NORCO/VICODIN) 5-325 MG tablet Take 1 tablet by mouth every 4 (four) hours as needed for moderate pain.   Lancet Device MISC 1 each by Does not apply route daily. May substitute to any manufacturer covered by patient's insurance.   Lancets Misc. MISC 1 each by Does not apply route daily. May substitute to any manufacturer covered by patient's insurance.   loperamide (IMODIUM) 2 MG capsule Take 4 mg po once. Repeat with 2mg  po after each loose stool. Max dose is 16 mg/day   methocarbamol (ROBAXIN) 750 MG tablet Take 1 tablet (750 mg total) by mouth every 8 (eight) hours as needed for muscle spasms.   metoprolol succinate (TOPROL-XL) 50 MG 24 hr tablet TAKE 1 TABLET BY MOUTH ONCE DAILY   montelukast (SINGULAIR) 10 MG tablet TAKE 1 TABLET BY MOUTH AT BEDTIME   Multiple Vitamins-Minerals (BARIATRIC MULTIVITAMINS/IRON PO) Take 1 tablet by mouth daily.   pantoprazole (PROTONIX) 40 MG tablet TAKE 1 TABLET BY MOUTH ONCE DAILY   pregabalin (LYRICA) 50 MG capsule TAKE 1 CAPSULE BY MOUTH TWICE DAILY   rOPINIRole (REQUIP) 2 MG tablet TAKE 1 TABLET BY MOUTH AT BEDTIME   [DISCONTINUED] DULoxetine (CYMBALTA) 20 MG capsule  TAKE 1 CAPSULE BY MOUTH ONCE DAILY   [DISCONTINUED] escitalopram (LEXAPRO) 10 MG tablet TAKE 1 TABLET BY MOUTH ONCE EVERY EVENING   [DISCONTINUED] furosemide (LASIX) 20 MG tablet TAKE 1 TABLET BY MOUTH ONCE DAILY   [DISCONTINUED] mirtazapine (REMERON) 15 MG tablet TAKE 1 TABLET BY MOUTH AT BEDTIME FOR SLEEP   [DISCONTINUED] rosuvastatin (CRESTOR) 10 MG tablet TAKE 1 TABLET BY MOUTH ONCE DAILY   [DISCONTINUED] Semaglutide-Weight Management 1 MG/0.5ML SOAJ Inject 1 mg into the skin once a week.    [DISCONTINUED] cetirizine (ZYRTEC) 10 MG tablet Take 1 tablet (10 mg total) by mouth daily for 10 days.   [DISCONTINUED] cyanocobalamin (VITAMIN B12) 100 MCG tablet Take 100 mcg by mouth daily.   [DISCONTINUED] fluticasone (FLONASE) 50 MCG/ACT nasal spray Place 2 sprays into both nostrils daily.   [DISCONTINUED] predniSONE (DELTASONE) 10 MG tablet Take 1 tablet (10 mg total) by mouth as directed.   No facility-administered medications prior to visit.    ROS Negative unless otherwise noted in HPI   Objective:     BP 120/70 Comment: home BP  Pulse 84   Resp 18   Ht 5\' 4"  (1.626 m)   Wt 212 lb (96.2 kg)   SpO2 97%   BMI 36.39 kg/m   Physical Exam Constitutional:      General: She is not in acute distress.    Appearance: Normal appearance.  HENT:     Head: Normocephalic and atraumatic.  Cardiovascular:     Rate and Rhythm: Normal rate and regular rhythm.     Heart sounds: No murmur heard.    No friction rub. No gallop.  Pulmonary:     Effort: Pulmonary effort is normal. No respiratory distress.     Breath sounds: No wheezing, rhonchi or rales.  Musculoskeletal:     Right lower leg: Edema (Nonpitting) present.     Left lower leg: Edema (Nonpitting) present.  Skin:    General: Skin is warm and dry.  Neurological:     Mental Status: She is alert and oriented to person, place, and time.    Diabetic Foot Exam - Simple   Simple Foot Form Diabetic Foot exam was performed with the following findings: Yes 01/26/2023 11:28 AM  Visual Inspection No deformities, no ulcerations, no other skin breakdown bilaterally: Yes Sensation Testing See comments: Yes Pulse Check Posterior Tibialis and Dorsalis pulse intact bilaterally: Yes Comments Touch intact on monofilament testing only on great toe bilaterally     Assessment & Plan:  Essential hypertension Assessment & Plan: BP goal <130/80.  Above goal in office, home blood pressure within goal.  Continue metoprolol succinate 50 mg  daily, furosemide 20 mg daily.  For the next 5 days, temporarily increasing furosemide dose to 40 mg daily to decrease peripheral edema.  Orders: -     CBC with Differential/Platelet; Future -     Comprehensive metabolic panel; Future  Hyperlipidemia associated with type 2 diabetes mellitus (HCC) Assessment & Plan: Last lipid panel: LDL 113, HDL 58, triglycerides 242.  Rechecking lipid panel prior to next appointment.  If still above goal, increase rosuvastatin.  Until then, continue rosuvastatin 10 mg daily.  Orders: -     CBC with Differential/Platelet; Future -     Comprehensive metabolic panel; Future -     Lipid panel; Future -     Rosuvastatin Calcium; Take 1 tablet (10 mg total) by mouth daily.  Dispense: 90 tablet; Refill: 1  Type 2 diabetes mellitus with peripheral neuropathy (  HCC) Assessment & Plan: Most recent A1c 5.8.  At next appointment, we will discuss starting Ozempic which should be covered by insurance given diagnosis of type 2 diabetes as previous prescription that was sent in was for [facility] which is not covered by Medicare.  Orders: -     CBC with Differential/Platelet; Future -     Comprehensive metabolic panel; Future -     Hemoglobin A1c; Future  Generalized anxiety disorder Assessment & Plan: Continue Cymbalta 20 mg daily.  Will continue to monitor.  Orders: -     Escitalopram Oxalate; Take 1 tablet (10 mg total) by mouth daily.  Dispense: 90 tablet; Refill: 1  Recurrent major depressive disorder, in partial remission (HCC) -     Mirtazapine; Take 1 tablet (15 mg total) by mouth at bedtime.  Dispense: 90 tablet; Refill: 1  Peripheral neuropathic pain -     DULoxetine HCl; Take 1 capsule (20 mg total) by mouth daily.  Dispense: 90 capsule; Refill: 1  Screening for viral disease -     Hepatitis C antibody; Future -     HIV Antibody (routine testing w rflx); Future  Need for influenza vaccination -     Flu Vaccine Trivalent High Dose (Fluad)  Acute  right hip pain -     DG Pelvis 1-2 Views; Future  Sacral pain -     DG Pelvis 1-2 Views; Future  Other orders -     Furosemide; Take 1 tablet (20 mg total) by mouth daily.  Dispense: 90 tablet; Refill: 1  Since imaging was not completed during initial fall, ordering pelvic imaging.  If no bony abnormalities, suspect muscle sprain/strain due to compensating for left foot pain and boot.  Return in about 3 months (around 04/27/2023) for follow-up, fasting blood work 1 week before.   I spent 40 minutes on the day of the encounter to include pre-visit record review, face-to-face time with the patient addressing acute and chronic issues, and post visit ordering of tests and medications.  Melida Quitter, PA

## 2023-01-26 NOTE — Assessment & Plan Note (Signed)
Continue Cymbalta 20 mg daily.  Will continue to monitor.

## 2023-01-26 NOTE — Assessment & Plan Note (Signed)
Most recent A1c 5.8.  At next appointment, we will discuss starting Ozempic which should be covered by insurance given diagnosis of type 2 diabetes as previous prescription that was sent in was for [name] which is not covered by Medicare.

## 2023-01-26 NOTE — Patient Instructions (Addendum)
Take 2 Lasix each day, either 1 tablet twice daily or 2 tablets each morning for the next 5 to 7 days.  We will start with some x-rays of your hip.  I would also recommend a schedule of Tylenol each day to reduce inflammation and decreased pain.  ADULT OTC PAIN MEDICATIONS:  Acetaminophen (Tylenol) - Immediate-release: 325 mg to 1000 mg (1 g) orally every 4 to 6 hours - Minimum Dosing Interval: every 4 hours - Maximum Single Dose: 1000 mg - Maximum Dose: 4000 mg per 24 hours  -DO NOT TAKE IF DRINKING ALCOHOL

## 2023-01-26 NOTE — Assessment & Plan Note (Addendum)
Last lipid panel: LDL 113, HDL 58, triglycerides 242.  Rechecking lipid panel prior to next appointment.  If still above goal, increase rosuvastatin.  Until then, continue rosuvastatin 10 mg daily.

## 2023-01-26 NOTE — Assessment & Plan Note (Addendum)
BP goal <130/80.  Above goal in office, home blood pressure within goal.  Continue metoprolol succinate 50 mg daily, furosemide 20 mg daily.  For the next 5 days, temporarily increasing furosemide dose to 40 mg daily to decrease peripheral edema.

## 2023-01-27 ENCOUNTER — Other Ambulatory Visit: Payer: Self-pay

## 2023-01-27 ENCOUNTER — Other Ambulatory Visit: Payer: Self-pay | Admitting: Family Medicine

## 2023-01-27 ENCOUNTER — Other Ambulatory Visit: Payer: Self-pay | Admitting: Nurse Practitioner

## 2023-01-27 DIAGNOSIS — G629 Polyneuropathy, unspecified: Secondary | ICD-10-CM

## 2023-01-27 DIAGNOSIS — I739 Peripheral vascular disease, unspecified: Secondary | ICD-10-CM

## 2023-01-27 MED ORDER — PREGABALIN 50 MG PO CAPS: 50.0000 mg | ORAL_CAPSULE | Freq: Two times a day (BID) | ORAL | 2 refills | Status: DC

## 2023-01-27 MED ORDER — CILOSTAZOL 100 MG PO TABS
100.0000 mg | ORAL_TABLET | Freq: Two times a day (BID) | ORAL | 3 refills | Status: DC
Start: 2023-01-27 — End: 2023-04-27

## 2023-01-27 NOTE — Progress Notes (Signed)
Sent message, via epic in basket, requesting orders in epic from surgeon.  

## 2023-01-28 ENCOUNTER — Ambulatory Visit
Admission: RE | Admit: 2023-01-28 | Discharge: 2023-01-28 | Disposition: A | Discharge status: Home / Self Care | Payer: Medicare Other | Source: Ambulatory Visit | Attending: Family Medicine | Admitting: Family Medicine

## 2023-01-28 ENCOUNTER — Encounter: Payer: Self-pay | Admitting: Family Medicine

## 2023-01-28 DIAGNOSIS — M25559 Pain in unspecified hip: Secondary | ICD-10-CM | POA: Diagnosis not present

## 2023-01-28 DIAGNOSIS — M533 Sacrococcygeal disorders, not elsewhere classified: Secondary | ICD-10-CM

## 2023-01-28 DIAGNOSIS — M160 Bilateral primary osteoarthritis of hip: Secondary | ICD-10-CM | POA: Diagnosis not present

## 2023-01-28 DIAGNOSIS — M25551 Pain in right hip: Secondary | ICD-10-CM

## 2023-01-28 NOTE — Telephone Encounter (Signed)
Already sent 01/27/2023

## 2023-01-28 NOTE — Addendum Note (Signed)
Addended by: Tonny Bollman on: 01/28/2023 02:37 PM   Modules accepted: Orders

## 2023-02-01 DIAGNOSIS — S300XXA Contusion of lower back and pelvis, initial encounter: Secondary | ICD-10-CM | POA: Diagnosis not present

## 2023-02-01 NOTE — Progress Notes (Signed)
Second request for pre op orders in [facility]: Left voicemail with Elnita Maxwell.

## 2023-02-02 ENCOUNTER — Telehealth: Payer: Self-pay

## 2023-02-02 ENCOUNTER — Ambulatory Visit (INDEPENDENT_AMBULATORY_CARE_PROVIDER_SITE_OTHER): Payer: Medicare Other

## 2023-02-02 ENCOUNTER — Telehealth: Payer: Self-pay | Admitting: *Deleted

## 2023-02-02 ENCOUNTER — Other Ambulatory Visit: Payer: Self-pay | Admitting: Orthopedic Surgery

## 2023-02-02 VITALS — Ht 64.0 in | Wt 212.0 lb

## 2023-02-02 DIAGNOSIS — Z0000 Encounter for general adult medical examination without abnormal findings: Secondary | ICD-10-CM | POA: Diagnosis not present

## 2023-02-02 DIAGNOSIS — Z13820 Encounter for screening for osteoporosis: Secondary | ICD-10-CM

## 2023-02-02 DIAGNOSIS — G8929 Other chronic pain: Secondary | ICD-10-CM

## 2023-02-02 MED ORDER — VANCOMYCIN HCL 10 G IV SOLR: 1000.0000 mg | Freq: Once | INTRAVENOUS | Status: DC

## 2023-02-02 NOTE — Telephone Encounter (Signed)
Pt thought her AWV was in office.  While she was here she wanted to let provider know that her tail bone is fractured from the fall. She went to foot dr and they xray her pelvis area as well.   Pt wanted to let you know that she is in a lot of pain.

## 2023-02-02 NOTE — Telephone Encounter (Signed)
Pt left message on phone.  Returned call and had to LVM for pt to call office back.

## 2023-02-02 NOTE — Patient Instructions (Signed)
DUE TO COVID-19 ONLY TWO VISITORS  (aged 67 and older)  ARE ALLOWED TO COME WITH YOU AND STAY IN THE WAITING ROOM ONLY DURING PRE OP AND PROCEDURE.   **NO VISITORS ARE ALLOWED IN THE SHORT STAY AREA OR RECOVERY ROOM!!**  IF YOU WILL BE ADMITTED INTO THE HOSPITAL YOU ARE ALLOWED ONLY FOUR SUPPORT PEOPLE DURING VISITATION HOURS ONLY (7 AM -8PM)   The support person(s) must pass our screening, gel in and out, and wear a mask at all times, including in the patient's room. Patients must also wear a mask when staff or their support person are in the room. Visitors GUEST BADGE MUST BE WORN VISIBLY  One adult visitor may remain with you overnight and MUST be in the room by 8 P.M.     Your procedure is scheduled on: 02/15/23   Report to [facility] Main Entrance    Report to admitting at : 5:15 AM   Call this number if you have problems the morning of surgery [phone]   Do not eat food :After Midnight.   After Midnight you may have the following liquids until : 4:00 AM DAY OF SURGERY  Water Black Coffee (sugar ok, NO MILK/CREAM OR CREAMERS)  Tea (sugar ok, NO MILK/CREAM OR CREAMERS) regular and decaf                             Plain Jell-O (NO RED)                                           Fruit ices (not with fruit pulp, NO RED)                                     Popsicles (NO RED)                                                                  Juice: apple, WHITE grape, WHITE cranberry Sports drinks like Gatorade (NO RED)   The day of surgery:  Drink ONE (1) Pre-Surgery Clear G2 at : 4:00 AM the morning of surgery. Drink in one sitting. Do not sip.  This drink was given to you during your hospital  pre-op appointment visit. Nothing else to drink after completing the  Pre-Surgery Clear Ensure or G2.          If you have questions, please contact your surgeon's office.  FOLLOW ANY ADDITIONAL PRE OP INSTRUCTIONS YOU RECEIVED FROM YOUR SURGEON'S OFFICE!!!   Oral Hygiene  is also important to reduce your risk of infection.                                    Remember - BRUSH YOUR TEETH THE MORNING OF SURGERY WITH YOUR REGULAR TOOTHPASTE  DENTURES WILL BE REMOVED PRIOR TO SURGERY PLEASE DO NOT APPLY "Poly grip" OR ADHESIVES!!!   Do NOT smoke after Midnight   Take these medicines the morning of surgery  with A SIP OF WATER: pregabalin,desloratadine,alosetron.Alprazolam as needed.  DO NOT TAKE ANY ORAL DIABETIC MEDICATIONS DAY OF YOUR SURGERY  Bring CPAP mask and tubing day of surgery.                              You may not have any metal on your body including hair pins, jewelry, and body piercing             Do not wear make-up, lotions, powders, perfumes/cologne, or deodorant  Do not wear nail polish including gel and S&S, artificial/acrylic nails, or any other type of covering on natural nails including finger and toenails. If you have artificial nails, gel coating, etc. that needs to be removed by a nail salon please have this removed prior to surgery or surgery may need to be canceled/ delayed if the surgeon/ anesthesia feels like they are unable to be safely monitored.   Do not shave  48 hours prior to surgery.    Do not bring valuables to the hospital. [facility] IS NOT             RESPONSIBLE   FOR VALUABLES.   Contacts, glasses, or bridgework may not be worn into surgery.   Bring small overnight bag day of surgery.   DO NOT BRING YOUR HOME MEDICATIONS TO THE HOSPITAL. PHARMACY WILL DISPENSE MEDICATIONS LISTED ON YOUR MEDICATION LIST TO YOU DURING YOUR ADMISSION IN THE HOSPITAL!    Patients discharged on the day of surgery will not be allowed to drive home.  Someone NEEDS to stay with you for the first 24 hours after anesthesia.   Special Instructions: Bring a copy of your healthcare power of attorney and living will documents         the day of surgery if you haven't scanned them before.              Please read over the following fact  sheets you were given: IF YOU HAVE QUESTIONS ABOUT YOUR PRE-OP INSTRUCTIONS PLEASE CALL [phone]      Pre-operative 5 CHG Bath Instructions   You can play a key role in reducing the risk of infection after surgery. Your skin needs to be as free of germs as possible. You can reduce the number of germs on your skin by washing with CHG (chlorhexidine gluconate) soap before surgery. CHG is an antiseptic soap that kills germs and continues to kill germs even after washing.   DO NOT use if you have an allergy to chlorhexidine/CHG or antibacterial soaps. If your skin becomes reddened or irritated, stop using the CHG and notify one of our RNs at : [phone].   Please shower with the CHG soap starting 4 days before surgery using the following schedule:     Please keep in mind the following:  DO NOT shave, including legs and underarms, starting the day of your first shower.   You may shave your face at any point before/day of surgery.  Place clean sheets on your bed the day you start using CHG soap. Use a clean washcloth (not used since being washed) for each shower. DO NOT sleep with pets once you start using the CHG.   CHG Shower Instructions:  If you choose to wash your hair and private area, wash first with your normal shampoo/soap.  After you use shampoo/soap, rinse your hair and body thoroughly to remove shampoo/soap residue.  Turn the water OFF  and apply about 3 tablespoons (45 ml) of CHG soap to a CLEAN washcloth.  Apply CHG soap ONLY FROM YOUR NECK DOWN TO YOUR TOES (washing for 3-5 minutes)  DO NOT use CHG soap on face, private areas, open wounds, or sores.  Pay special attention to the area where your surgery is being performed.  If you are having back surgery, having someone wash your back for you may be helpful. Wait 2 minutes after CHG soap is applied, then you may rinse off the CHG soap.  Pat dry with a clean towel  Put on clean clothes/pajamas   If you choose to wear  lotion, please use ONLY the CHG-compatible lotions on the back of this paper.     Additional instructions for the day of surgery: DO NOT APPLY any lotions, deodorants, cologne, or perfumes.   Put on clean/comfortable clothes.  Brush your teeth.  Ask your nurse before applying any prescription medications to the skin.    CHG Compatible Lotions   Aveeno Moisturizing lotion  Cetaphil Moisturizing Cream  Cetaphil Moisturizing Lotion  Clairol Herbal Essence Moisturizing Lotion, Dry Skin  Clairol Herbal Essence Moisturizing Lotion, Extra Dry Skin  Clairol Herbal Essence Moisturizing Lotion, Normal Skin  Curel Age Defying Therapeutic Moisturizing Lotion with Alpha Hydroxy  Curel Extreme Care Body Lotion  Curel Soothing Hands Moisturizing Hand Lotion  Curel Therapeutic Moisturizing Cream, Fragrance-Free  Curel Therapeutic Moisturizing Lotion, Fragrance-Free  Curel Therapeutic Moisturizing Lotion, Original Formula  Eucerin Daily Replenishing Lotion  Eucerin Dry Skin Therapy Plus Alpha Hydroxy Crme  Eucerin Dry Skin Therapy Plus Alpha Hydroxy Lotion  Eucerin Original Crme  Eucerin Original Lotion  Eucerin Plus Crme Eucerin Plus Lotion  Eucerin TriLipid Replenishing Lotion  Keri Anti-Bacterial Hand Lotion  Keri Deep Conditioning Original Lotion Dry Skin Formula Softly Scented  Keri Deep Conditioning Original Lotion, Fragrance Free Sensitive Skin Formula  Keri Lotion Fast Absorbing Fragrance Free Sensitive Skin Formula  Keri Lotion Fast Absorbing Softly Scented Dry Skin Formula  Keri Original Lotion  Keri Skin Renewal Lotion Keri Silky Smooth Lotion  Keri Silky Smooth Sensitive Skin Lotion  Nivea Body Creamy Conditioning Oil  Nivea Body Extra Enriched Lotion  Nivea Body Original Lotion  Nivea Body Sheer Moisturizing Lotion Nivea Crme  Nivea Skin Firming Lotion  NutraDerm 30 Skin Lotion  NutraDerm Skin Lotion  NutraDerm Therapeutic Skin Cream  NutraDerm Therapeutic Skin Lotion   ProShield Protective Hand Cream  Provon moisturizing lotion   Incentive Spirometer  An incentive spirometer is a tool that can help keep your lungs clear and active. This tool measures how well you are filling your lungs with each breath. Taking Bozzi deep breaths may help reverse or decrease the chance of developing breathing (pulmonary) problems (especially infection) following: A Milligan period of time when you are unable to move or be active. BEFORE THE PROCEDURE  If the spirometer includes an indicator to show your best effort, your nurse or respiratory therapist will set it to a desired goal. If possible, sit up straight or lean slightly forward. Try not to slouch. Hold the incentive spirometer in an upright position. INSTRUCTIONS FOR USE  Sit on the edge of your bed if possible, or sit up as far as you can in bed or on a chair. Hold the incentive spirometer in an upright position. Breathe out normally. Place the mouthpiece in your mouth and seal your lips tightly around it. Breathe in slowly and as deeply as possible, raising the piston or the ball toward  the top of the column. Hold your breath for 3-5 seconds or for as Napier as possible. Allow the piston or ball to fall to the bottom of the column. Remove the mouthpiece from your mouth and breathe out normally. Rest for a few seconds and repeat Steps 1 through 7 at least 10 times every 1-2 hours when you are awake. Take your time and take a few normal breaths between deep breaths. The spirometer may include an indicator to show your best effort. Use the indicator as a goal to work toward during each repetition. After each set of 10 deep breaths, practice coughing to be sure your lungs are clear. If you have an incision (the cut made at the time of surgery), support your incision when coughing by placing a pillow or rolled up towels firmly against it. Once you are able to get out of bed, walk around indoors and cough well. You may stop using  the incentive spirometer when instructed by your caregiver.  RISKS AND COMPLICATIONS Take your time so you do not get dizzy or light-headed. If you are in pain, you may need to take or ask for pain medication before doing incentive spirometry. It is harder to take a deep breath if you are having pain. AFTER USE Rest and breathe slowly and easily. It can be helpful to keep track of a log of your progress. Your caregiver can provide you with a simple table to help with this. If you are using the spirometer at home, follow these instructions: SEEK MEDICAL CARE IF:  You are having difficultly using the spirometer. You have trouble using the spirometer as often as instructed. Your pain medication is not giving enough relief while using the spirometer. You develop fever of 100.5 F (38.1 C) or higher. SEEK IMMEDIATE MEDICAL CARE IF:  You cough up bloody sputum that had not been present before. You develop fever of 102 F (38.9 C) or greater. You develop worsening pain at or near the incision site. MAKE SURE YOU:  Understand these instructions. Will watch your condition. Will get help right away if you are not doing well or get worse. Document Released: 09/07/2006 Document Revised: 07/20/2011 Document Reviewed: 11/08/2006 [facility] Patient Information 2014 [place], [place].   ________________________________________________________________________

## 2023-02-02 NOTE — Progress Notes (Signed)
Subjective:   Wendy Hubbard is a 67 y.o. female who presents for Medicare Annual (Subsequent) preventive examination.  Visit Complete: Virtual  I connected with  Wendy Hubbard on 02/02/23 by a audio enabled telemedicine application and verified that I am speaking with the correct person using two identifiers.  Patient Location: Home  Provider Location: Home Office  I discussed the limitations of evaluation and management by telemedicine. The patient expressed understanding and agreed to proceed.   Vital Signs: Unable to obtain new vitals due to this being a telehealth visit.   Cardiac Risk Factors include: advanced age (>52men, >14 women);diabetes mellitus;hypertension     Objective:    Today's Vitals   02/02/23 1116 02/02/23 1118  Weight: 212 lb (96.2 kg)   Height: 5\' 4"  (1.626 m)   PainSc:  0-No pain   Body mass index is 36.39 kg/m.     02/02/2023   11:27 AM 12/15/2022    1:31 PM 11/24/2022    3:13 PM 11/05/2022    8:26 AM 10/21/2022    8:20 AM 08/11/2022   11:17 AM 08/05/2022    9:55 AM  Advanced Directives  Does Patient Have a Medical Advance Directive? No No No No No No No  Would patient like information on creating a medical advance directive? No - Patient declined No - Patient declined    No - Patient declined No - Patient declined    Current Medications (verified) Outpatient Encounter Medications as of 02/02/2023  Medication Sig   alosetron (LOTRONEX) 1 MG tablet Take 1 tablet (1 mg total) by mouth 2 (two) times daily.   ALPRAZolam (XANAX) 0.5 MG tablet TAKE 1/2 TO 1 TABLET BY MOUTH ONCE DAILYAS NEEDED FOR ACUTE ANXIETY   Blood Glucose Monitoring Suppl DEVI 1 each by Does not apply route in the morning, at noon, and at bedtime. May substitute to any manufacturer covered by patient's insurance.   cilostazol (PLETAL) 100 MG tablet Take 1 tablet (100 mg total) by mouth 2 (two) times daily.   desloratadine (CLARINEX) 5 MG tablet Take 1 tablet (5 mg total) by mouth daily.    DULoxetine (CYMBALTA) 20 MG capsule Take 1 capsule (20 mg total) by mouth daily. (Patient taking differently: Take 20 mg by mouth every evening.)   escitalopram (LEXAPRO) 10 MG tablet Take 1 tablet (10 mg total) by mouth daily. (Patient taking differently: Take 10 mg by mouth every evening.)   furosemide (LASIX) 20 MG tablet Take 1 tablet (20 mg total) by mouth daily.   Glucose Blood (BLOOD GLUCOSE TEST STRIPS) STRP 1 each by In Vitro route daily. May substitute to any manufacturer covered by patient's insurance.   HYDROcodone-acetaminophen (NORCO/VICODIN) 5-325 MG tablet Take 1 tablet by mouth every 4 (four) hours as needed for moderate pain.   Lancet Device MISC 1 each by Does not apply route daily. May substitute to any manufacturer covered by patient's insurance.   Lancets Misc. MISC 1 each by Does not apply route daily. May substitute to any manufacturer covered by patient's insurance.   loperamide (IMODIUM) 2 MG capsule Take 4 mg po once. Repeat with 2mg  po after each loose stool. Max dose is 16 mg/day   methocarbamol (ROBAXIN) 750 MG tablet Take 1 tablet (750 mg total) by mouth every 8 (eight) hours as needed for muscle spasms. (Patient taking differently: Take 750 mg by mouth See admin instructions. Take 1 tablet (750 mg) by mouth scheduled every night, may take up to 2 additional dose  every 8 hours if needed for spasms.)   metoprolol succinate (TOPROL-XL) 50 MG 24 hr tablet TAKE 1 TABLET BY MOUTH ONCE DAILY (Patient taking differently: Take 50 mg by mouth every evening.)   mirtazapine (REMERON) 15 MG tablet Take 1 tablet (15 mg total) by mouth at bedtime.   montelukast (SINGULAIR) 10 MG tablet TAKE 1 TABLET BY MOUTH AT BEDTIME   pantoprazole (PROTONIX) 40 MG tablet TAKE 1 TABLET BY MOUTH ONCE DAILY (Patient taking differently: Take 40 mg by mouth at bedtime. TAKE 1 TABLET BY MOUTH ONCE DAILY)   pregabalin (LYRICA) 50 MG capsule Take 1 capsule (50 mg total) by mouth 2 (two) times daily.    rOPINIRole (REQUIP) 2 MG tablet TAKE 1 TABLET BY MOUTH AT BEDTIME   rosuvastatin (CRESTOR) 10 MG tablet Take 1 tablet (10 mg total) by mouth daily. (Patient taking differently: Take 10 mg by mouth at bedtime.)   No facility-administered encounter medications on file as of 02/02/2023.    Allergies (verified) Flagyl [metronidazole], Penicillins, and Doxycycline   History: Past Medical History:  Diagnosis Date   Allergy    Phreesia 07/23/2020   Anxiety    Phreesia 07/23/2020   COPD (chronic obstructive pulmonary disease) (HCC)    DDD (degenerative disc disease), thoracic    Depression    Phreesia 07/23/2020   Diabetes mellitus without complication (HCC)    type 2   GERD (gastroesophageal reflux disease)    Hyperlipidemia    Nausea and vomiting 03/04/2022   Neuromuscular disorder (HCC)    severe neuropathy feet   Pneumonia    Rapid heart rate    Sleep apnea    no longer uses CPAP due to weight loss   TMJ (temporomandibular joint disorder) 11/28/2022   Wears contact lenses    Past Surgical History:  Procedure Laterality Date   ABDOMINAL HYSTERECTOMY     ANKLE ARTHROSCOPY Bilateral    APPENDECTOMY     BLADDER SUSPENSION     BREAST EXCISIONAL BIOPSY Left 1990   neg   BREAST EXCISIONAL BIOPSY Right 1989   neg   BREAST SURGERY     CHOLECYSTECTOMY     COLONOSCOPY N/A 11/04/2015   Procedure: COLONOSCOPY;  Surgeon: Scot Jun, MD;  Location[facility] ENDOSCOPY;  Service: Endoscopy;  Laterality: N/A;   COLONOSCOPY WITH PROPOFOL N/A 08/30/2020   Procedure: COLONOSCOPY WITH PROPOFOL;  Surgeon: Midge Minium, MD;  Location: [facility];  Service: Endoscopy;  Laterality: N/A;   ESOPHAGOGASTRODUODENOSCOPY N/A 11/02/2015   Procedure: ESOPHAGOGASTRODUODENOSCOPY (EGD);  Surgeon: Scot Jun, MD;  Location: [facility] ENDOSCOPY;  Service: Endoscopy;  Laterality: N/A;   ESOPHAGOGASTRODUODENOSCOPY (EGD) WITH PROPOFOL N/A 03/16/2022   Procedure: ESOPHAGOGASTRODUODENOSCOPY (EGD) WITH  PROPOFOL foregin body removal;  Surgeon: Midge Minium, MD;  Location: [facility];  Service: Endoscopy;  Laterality: N/A;  Diabetic   GASTROJEJUNOSTOMY N/A 08/25/2021   Procedure: LAPAROSCOPIC REDO OF STOMACH TO INTESTINE;  Surgeon: Sheliah Hatch, De Blanch, MD;  Location: [facility] ORS;  Service: General;  Laterality: N/A;   KNEE ARTHROSCOPY Left    PARTIAL GASTRECTOMY N/A 08/25/2021   Procedure: PARTIAL GASTRECTOMY;  Surgeon: Sheliah Hatch De Blanch, MD;  Location: [facility] ORS;  Service: General;  Laterality: N/A;   POLYPECTOMY  08/30/2020   Procedure: POLYPECTOMY;  Surgeon: Midge Minium, MD;  Location: [facility];  Service: Endoscopy;;   REDUCTION MAMMAPLASTY Bilateral 1988   TONSILLECTOMY     UPPER GI ENDOSCOPY  08/25/2021   Procedure: UPPER GI ENDOSCOPY;  Surgeon: Rodman Pickle, MD;  Location: [facility] OR[facility];  Service: General;;   Family History  Problem Relation Age of Onset   Hypertension Other    Bladder Cancer Mother    High Cholesterol Mother    High blood pressure Mother    Cancer Mother    Heart attack Father    High blood pressure Father    Kidney cancer Neg Hx    Prostate cancer Neg Hx    Breast cancer Neg Hx    Social History   Socioeconomic History   Marital status: Married    Spouse name: Not on file   Number of children: Not on file   Years of education: Not on file   Highest education level: Not on file  Occupational History   Not on file  Tobacco Use   Smoking status: Former    Current packs/day: 0.00    Types: Cigarettes    Start date: 31    Quit date: 1996    Years since quitting: 28.7   Smokeless tobacco: Never   Tobacco comments:    quit in 1996  Vaping Use   Vaping status: Never Used  Substance and Sexual Activity   Alcohol use: Yes    Alcohol/week: 2.0 standard drinks of alcohol    Types: 1 Glasses of wine, 1 Standard drinks or equivalent per week    Comment: occasional  social   Drug use: No   Sexual activity: Yes    Partners: Male     Birth control/protection: Surgical, Other-see comments  Other Topics Concern   Not on file  Social History Narrative   Not on file   Social Determinants of Health   Financial Resource Strain: Low Risk  (02/02/2023)   Overall Financial Resource Strain (CARDIA)    Difficulty of Paying Living Expenses: Not hard at all  Food Insecurity: No Food Insecurity (02/02/2023)   Hunger Vital Sign    Worried About Running Out of Food in the Last Year: Never true    Ran Out of Food in the Last Year: Never true  Transportation Needs: No Transportation Needs (02/02/2023)   PRAPARE - Administrator, Civil Service (Medical): No    Lack of Transportation (Non-Medical): No  Physical Activity: Inactive (02/02/2023)   Exercise Vital Sign    Days of Exercise per Week: 0 days    Minutes of Exercise per Session: 0 min  Stress: No Stress Concern Present (02/02/2023)   [employer] of Occupational Health - Occupational Stress Questionnaire    Feeling of Stress : Not at all  Social Connections: Socially Integrated (02/02/2023)   Social Connection and Isolation Panel [NHANES]    Frequency of Communication with Friends and Family: More than three times a week    Frequency of Social Gatherings with Friends and Family: More than three times a week    Attends Religious Services: More than 4 times per year    Active Member of [employer] or Organizations: Yes    Attends Engineer, structural: More than 4 times per year    Marital Status: Married    Tobacco Counseling Counseling given: Not Answered Tobacco comments: quit in 1996   Clinical Intake:  Pre-visit preparation completed: Yes Activities of Daily Living    02/02/2023   11:22 AM 04/29/2022    4:24 PM  In your present state of health, do you have any difficulty performing the following activities:  Hearing? 0 0  Vision? 0 1  Difficulty concentrating or making decisions? 0 0  Walking or climbing stairs? 0 1  Dressing or bathing? 0 0   Doing errands, shopping? 0 0  Preparing Food and eating ? N   Using the Toilet? N   In the past six months, have you accidently leaked urine? Y   Comment Wears pads.  Pending medical attention.   Do you have problems with loss of bowel control? N   Managing your Medications? N   Managing your Finances? N   Housekeeping or managing your Housekeeping? N     Patient Care Team: Melida Quitter, PA as PCP - General (Family Medicine)  Indicate any recent Medical Services you may have received from other than [facility] providers in the past year (date may be approximate).     Assessment:   This is a routine wellness examination for [place].  Hearing/Vision screen Hearing Screening - Comments:: Denies hearing difficulties   Vision Screening - Comments:: Wears rx glasses - up to date with routine eye exams with  Dr Clydene Pugh   Goals Addressed               This Visit's Progress     Lose 40 lbs (pt-stated)         Depression Screen    02/02/2023   11:21 AM 01/26/2023   10:13 AM 11/05/2022    8:26 AM 10/21/2022    8:20 AM 10/09/2022    8:56 AM 08/11/2022   11:17 AM 08/05/2022    9:54 AM  PHQ 2/9 Scores  PHQ - 2 Score 0 0 0 0 0 0 0  PHQ- 9 Score 0 6   2      Fall Risk    02/02/2023   11:25 AM 01/26/2023   10:13 AM 12/15/2022    1:31 PM 11/05/2022    8:26 AM 10/21/2022    8:20 AM  Fall Risk   Falls in the past year? 1 1 0 0 0  Number falls in past yr: 0 1     Injury with Fall? 1 1     Comment Fx: Left foot and coccyx area. Followed by medical attention      Risk for fall due to : No Fall Risks History of fall(s)     Follow up Falls prevention discussed Falls evaluation completed       MEDICARE RISK AT HOME: Medicare Risk at Home Any stairs in or around the home?: Yes If so, are there any without handrails?: No Home free of loose throw rugs in walkways, pet beds, electrical cords, etc?: Yes Adequate lighting in your home to reduce risk of falls?: Yes Life alert?: No Use of a  cane, walker or w/c?: No Grab bars in the bathroom?: Yes Shower chair or bench in shower?: Yes Elevated toilet seat or a handicapped toilet?: Yes  TIMED UP AND GO:  Was the test performed?  No    Cognitive Function:        02/02/2023   11:27 AM  6CIT Screen  What Year? 0 points  What month? 0 points  What time? 0 points  Count back from 20 0 points  Months in reverse 0 points  Repeat phrase 0 points  Total Score 0 points    Immunizations Immunization History  Administered Date(s) Administered   Fluad Quad(high Dose 65+) 03/24/2021   Fluad Trivalent(High Dose 65+) 01/26/2023   Influenza,inj,Quad PF,6+ Mos 04/29/2016, 03/04/2022   PFIZER(Purple Top)SARS-COV-2 Vaccination 08/04/2019, 08/29/2019   Tdap 03/04/2022   Zoster Recombinant(Shingrix) 04/21/2021, 07/24/2021  TDAP status: Up to date  Flu Vaccine status: Up to date  Pneumococcal vaccine status: Due, Education has been provided regarding the importance of this vaccine. Advised may receive this vaccine at local pharmacy or Health Dept. Aware to provide a copy of the vaccination record if obtained from local pharmacy or Health Dept. Verbalized acceptance and understanding.  Covid-19 vaccine status: Declined, Education has been provided regarding the importance of this vaccine but patient still declined. Advised may receive this vaccine at local pharmacy or Health Dept.or vaccine clinic. Aware to provide a copy of the vaccination record if obtained from local pharmacy or Health Dept. Verbalized acceptance and understanding.  Qualifies for Shingles Vaccine? Yes   Zostavax completed Yes   Shingrix Completed?: Yes  Screening Tests Health Maintenance  Topic Date Due   Hepatitis C Screening  Never done   COVID-19 Vaccine (3 - Pfizer risk series) 09/26/2019   Pneumonia Vaccine 29+ Years old (1 of 1 - PCV) Never done   DEXA SCAN  Never done   OPHTHALMOLOGY EXAM  10/22/2022   HEMOGLOBIN A1C  01/22/2023   Diabetic  kidney evaluation - eGFR measurement  07/22/2023   Diabetic kidney evaluation - Urine ACR  07/22/2023   FOOT EXAM  01/26/2024   Medicare Annual Wellness (AWV)  02/02/2024   MAMMOGRAM  03/20/2024   Colonoscopy  08/31/2030   DTaP/Tdap/Td (2 - Td or Tdap) 03/04/2032   INFLUENZA VACCINE  Completed   Zoster Vaccines- Shingrix  Completed   HPV VACCINES  Aged Out    Health Maintenance  Health Maintenance Due  Topic Date Due   Hepatitis C Screening  Never done   COVID-19 Vaccine (3 - Pfizer risk series) 09/26/2019   Pneumonia Vaccine 15+ Years old (1 of 1 - PCV) Never done   DEXA SCAN  Never done   OPHTHALMOLOGY EXAM  10/22/2022   HEMOGLOBIN A1C  01/22/2023    Colorectal cancer screening: Type of screening: Colonoscopy. Completed 08/30/20. Repeat every 10 years  Mammogram status: Completed 03/20/22. Repeat every year  Bone Density status: Ordered 02/02/23. Pt provided with contact info and advised to call to schedule appt.    Additional Screening:  Hepatitis C Screening: does qualify; Deferred  Vision Screening: Recommended annual ophthalmology exams for early detection of glaucoma and other disorders of the eye. Is the patient up to date with their annual eye exam?  Yes  Who is the provider or what is the name of the office in which the patient attends annual eye exams? Dr Clydene Pugh If pt is not established with a provider, would they like to be referred to a provider to establish care? No .   Dental Screening: Recommended annual dental exams for proper oral hygiene  Diabetic Foot Exam: Diabetic Foot Exam: Completed 01/26/23  Community Resource Referral / Chronic Care Management:  CRR required this visit?  No   CCM required this visit?  No     Plan:     I have personally reviewed and noted the following in the patient's chart:   Medical and social history Use of alcohol, tobacco or illicit drugs  Current medications and supplements including opioid prescriptions.  Patient is currently taking opioid prescriptions. Information provided to patient regarding non-opioid alternatives. Patient advised to discuss non-opioid treatment plan with their provider. Functional ability and status Nutritional status Physical activity Advanced directives List of other physicians Hospitalizations, surgeries, and ER visits in previous 12 months Vitals Screenings to include cognitive, depression, and falls Referrals and appointments  In addition, I have reviewed and discussed with patient certain preventive protocols, quality metrics, and best practice recommendations. A written personalized care plan for preventive services as well as general preventive health recommendations were provided to patient.     Tillie Rung, LPN   [id number]   After Visit Summary: (MyChart) Due to this being a telephonic visit, the after visit summary with patients personalized plan was offered to patient via MyChart   Nurse Notes: None

## 2023-02-02 NOTE — Patient Instructions (Addendum)
[name] , Thank you for taking time to come for your Medicare Wellness Visit. I appreciate your ongoing commitment to your health goals. Please review the following plan we discussed and let me know if I can assist you in the future.   Referrals/Orders/Follow-Ups/Clinician Recommendations:   This is a list of the screening recommended for you and due dates:  Health Maintenance  Topic Date Due   Hepatitis C Screening  Never done   COVID-19 Vaccine (3 - Pfizer risk series) 09/26/2019   Pneumonia Vaccine (1 of 1 - PCV) Never done   DEXA scan (bone density measurement)  Never done   Eye exam for diabetics  10/22/2022   Hemoglobin A1C  01/22/2023   Yearly kidney function blood test for diabetes  07/22/2023   Yearly kidney health urinalysis for diabetes  07/22/2023   Complete foot exam   01/26/2024   Medicare Annual Wellness Visit  02/02/2024   Mammogram  03/20/2024   Colon Cancer Screening  08/31/2030   DTaP/Tdap/Td vaccine (2 - Td or Tdap) 03/04/2032   Flu Shot  Completed   Zoster (Shingles) Vaccine  Completed   HPV Vaccine  Aged Out   Opioid Pain Medicine Management Opioids are powerful medicines that are used to treat moderate to severe pain. When used for short periods of time, they can help you to: Sleep better. Do better in physical or occupational therapy. Feel better in the first few days after an injury. Recover from surgery. Opioids should be taken with the supervision of a trained health care provider. They should be taken for the shortest period of time possible. This is because opioids can be addictive, and the longer you take opioids, the greater your risk of addiction. This addiction can also be called opioid use disorder. What are the risks? Using opioid pain medicines for longer than 3 days increases your risk of side effects. Side effects include: Constipation. Nausea and vomiting. Breathing difficulties (respiratory depression). Drowsiness. Confusion. Opioid use  disorder. Itching. Taking opioid pain medicine for a Pain period of time can affect your ability to do daily tasks. It also puts you at risk for: Motor vehicle crashes. Depression. Suicide. Heart attack. Overdose, which can be life-threatening. What is a pain treatment plan? A pain treatment plan is an agreement between you and your health care provider. Pain is unique to each person, and treatments vary depending on your condition. To manage your pain, you and your health care provider need to work together. To help you do this: Discuss the goals of your treatment, including how much pain you might expect to have and how you will manage the pain. Review the risks and benefits of taking opioid medicines. Remember that a good treatment plan uses more than one approach and minimizes the chance of side effects. Be honest about the amount of medicines you take and about any drug or alcohol use. Get pain medicine prescriptions from only one health care provider. Pain can be managed with many types of alternative treatments. Ask your health care provider to refer you to one or more specialists who can help you manage pain through: Physical or occupational therapy. Counseling (cognitive behavioral therapy). Good nutrition. Biofeedback. Massage. Meditation. Non-opioid medicine. Following a gentle exercise program. How to use opioid pain medicine Taking medicine Take your pain medicine exactly as told by your health care provider. Take it only when you need it. If your pain gets less severe, you may take less than your prescribed dose if your  health care provider approves. If you are not having pain, do nottake pain medicine unless your health care provider tells you to take it. If your pain is severe, do nottry to treat it yourself by taking more pills than instructed on your prescription. Contact your health care provider for help. Write down the times when you take your pain medicine. It is  easy to become confused while on pain medicine. Writing the time can help you avoid overdose. Take other over-the-counter or prescription medicines only as told by your health care provider. Keeping yourself and others safe  While you are taking opioid pain medicine: Do not drive, use machinery, or power tools. Do not sign legal documents. Do not drink alcohol. Do not take sleeping pills. Do not supervise children by yourself. Do not do activities that require climbing or being in high places. Do not go to a lake, river, ocean, spa, or swimming pool. Do not share your pain medicine with anyone. Keep pain medicine in a locked cabinet or in a secure area where pets and children cannot reach it. Stopping your use of opioids If you have been taking opioid medicine for more than a few weeks, you may need to slowly decrease (taper) how much you take until you stop completely. Tapering your use of opioids can decrease your risk of symptoms of withdrawal, such as: Pain and cramping in the abdomen. Nausea. Sweating. Sleepiness. Restlessness. Uncontrollable shaking (tremors). Cravings for the medicine. Do not attempt to taper your use of opioids on your own. Talk with your health care provider about how to do this. Your health care provider may prescribe a step-down schedule based on how much medicine you are taking and how [place] you have been taking it. Getting rid of leftover pills Do not save any leftover pills. Get rid of leftover pills safely by: Taking the medicine to a prescription take-back program. This is usually offered by the county or law enforcement. Bringing them to a pharmacy that has a drug disposal container. Flushing them down the toilet. Check the label or package insert of your medicine to see whether this is safe to do. Throwing them out in the trash. Check the label or package insert of your medicine to see whether this is safe to do. If it is safe to throw it out, remove  the medicine from the original container, put it into a sealable bag or container, and mix it with used coffee grounds, food scraps, dirt, or cat litter before putting it in the trash. Follow these instructions at home: Activity Do exercises as told by your health care provider. Avoid activities that make your pain worse. Return to your normal activities as told by your health care provider. Ask your health care provider what activities are safe for you. General instructions You may need to take these actions to prevent or treat constipation: Drink enough fluid to keep your urine pale yellow. Take over-the-counter or prescription medicines. Eat foods that are high in fiber, such as beans, whole grains, and fresh fruits and vegetables. Limit foods that are high in fat and processed sugars, such as fried or sweet foods. Keep all follow-up visits. This is important. Where to find support If you have been taking opioids for a [place] time, you may benefit from receiving support for quitting from a local support group or counselor. Ask your health care provider for a referral to these resources in your area. Where to find more information Centers for Disease Control and  Prevention (CDC): FootballExhibition.com.br [place] Food and Drug Administration (FDA): PumpkinSearch.com.ee Get help right away if: You may have taken too much of an opioid (overdosed). Common symptoms of an overdose: Your breathing is slower or more shallow than normal. You have a very slow heartbeat (pulse). You have slurred speech. You have nausea and vomiting. Your pupils become very small. You have other potential symptoms: You are very confused. You faint or feel like you will faint. You have cold, clammy skin. You have blue lips or fingernails. You have thoughts of harming yourself or harming others. These symptoms may represent a serious problem that is an emergency. Do not wait to see if the symptoms will go away. Get medical help right away.  Call your local emergency services (911 in the [place] Do not drive yourself to the hospital.  If you ever feel like you may hurt yourself or others, or have thoughts about taking your own life, get help right away. Go to your nearest emergency department or: Call your local emergency services (911 in the [place] Call the [facility] ((332)681-8777 in the [place] Call a suicide crisis helpline, such as the National Suicide Prevention Lifeline at 919-172-2483 or 988 in the [place] This is open 24 hours a day in the [place] Text the Crisis Text Line at 539-631-3562 (in the [place] Summary Opioid medicines can help you manage moderate to severe pain for a short period of time. A pain treatment plan is an agreement between you and your health care provider. Discuss the goals of your treatment, including how much pain you might expect to have and how you will manage the pain. If you think that you or someone else may have taken too much of an opioid, get medical help right away. This information is not intended to replace advice given to you by your health care provider. Make sure you discuss any questions you have with your health care provider. Document Revised: 11/20/2020 Document Reviewed: 08/07/2020 Elsevier Patient Education  2024 Elsevier Inc.  Advanced directives: (Declined) Advance directive discussed with you today. Even though you declined this today, please call our office should you change your mind, and we can give you the proper paperwork for you to fill out.  Next Medicare Annual Wellness Visit scheduled for next year: Yes

## 2023-02-02 NOTE — Telephone Encounter (Signed)
Yes, I received a copy of the notes from her provider at [facility].  I am glad that they were able to do imaging and have the results sooner and get her some medications for the pain.  She is definitely in good hands with them to take care of of her tailbone.

## 2023-02-03 ENCOUNTER — Encounter (HOSPITAL_COMMUNITY)
Admission: RE | Admit: 2023-02-03 | Discharge: 2023-02-03 | Disposition: A | Discharge status: Home / Self Care | Payer: Medicare Other | Source: Ambulatory Visit | Attending: Orthopedic Surgery | Admitting: Orthopedic Surgery

## 2023-02-03 ENCOUNTER — Other Ambulatory Visit: Payer: Self-pay

## 2023-02-03 ENCOUNTER — Encounter (HOSPITAL_COMMUNITY): Payer: Self-pay

## 2023-02-03 VITALS — BP 168/86 | HR 67 | Temp 97.7°F | Ht 64.0 in | Wt 206.0 lb

## 2023-02-03 DIAGNOSIS — M25562 Pain in left knee: Secondary | ICD-10-CM | POA: Diagnosis not present

## 2023-02-03 DIAGNOSIS — Z01818 Encounter for other preprocedural examination: Secondary | ICD-10-CM

## 2023-02-03 DIAGNOSIS — G8929 Other chronic pain: Secondary | ICD-10-CM | POA: Diagnosis not present

## 2023-02-03 DIAGNOSIS — E1142 Type 2 diabetes mellitus with diabetic polyneuropathy: Secondary | ICD-10-CM | POA: Insufficient documentation

## 2023-02-03 DIAGNOSIS — Z01812 Encounter for preprocedural laboratory examination: Secondary | ICD-10-CM | POA: Insufficient documentation

## 2023-02-03 LAB — CBC WITH DIFFERENTIAL/PLATELET
Abs Immature Granulocytes: 0.08 10*3/uL — ABNORMAL HIGH (ref 0.00–0.07)
Basophils Absolute: 0.1 10*3/uL (ref 0.0–0.1)
Basophils Relative: 0 %
Eosinophils Absolute: 0 10*3/uL (ref 0.0–0.5)
Eosinophils Relative: 0 %
HCT: 43.6 % (ref 36.0–46.0)
Hemoglobin: 14.1 g/dL (ref 12.0–15.0)
Immature Granulocytes: 1 %
Lymphocytes Relative: 20 %
Lymphs Abs: 2.8 10*3/uL (ref 0.7–4.0)
MCH: 31.3 pg (ref 26.0–34.0)
MCHC: 32.3 g/dL (ref 30.0–36.0)
MCV: 96.7 fL (ref 80.0–100.0)
Monocytes Absolute: 0.6 10*3/uL (ref 0.1–1.0)
Monocytes Relative: 4 %
Neutro Abs: 10.7 10*3/uL — ABNORMAL HIGH (ref 1.7–7.7)
Neutrophils Relative %: 75 %
Platelets: 388 10*3/uL (ref 150–400)
RBC: 4.51 MIL/uL (ref 3.87–5.11)
RDW: 12.6 % (ref 11.5–15.5)
WBC: 14.3 10*3/uL — ABNORMAL HIGH (ref 4.0–10.5)
nRBC: 0 % (ref 0.0–0.2)

## 2023-02-03 LAB — COMPREHENSIVE METABOLIC PANEL
ALT: 15 U/L (ref 0–44)
AST: 15 U/L (ref 15–41)
Albumin: 4.1 g/dL (ref 3.5–5.0)
Alkaline Phosphatase: 117 U/L (ref 38–126)
Anion gap: 9 (ref 5–15)
BUN: 16 mg/dL (ref 8–23)
CO2: 28 mmol/L (ref 22–32)
Calcium: 9.4 mg/dL (ref 8.9–10.3)
Chloride: 102 mmol/L (ref 98–111)
Creatinine, Ser: 1.27 mg/dL — ABNORMAL HIGH (ref 0.44–1.00)
GFR, Estimated: 46 mL/min — ABNORMAL LOW (ref >60)
Glucose, Bld: 138 mg/dL — ABNORMAL HIGH (ref 70–99)
Potassium: 3.9 mmol/L (ref 3.5–5.1)
Sodium: 139 mmol/L (ref 135–145)
Total Bilirubin: 0.7 mg/dL (ref 0.3–1.2)
Total Protein: 7.2 g/dL (ref 6.5–8.1)

## 2023-02-03 LAB — GLUCOSE, CAPILLARY: Glucose-Capillary: 150 mg/dL — ABNORMAL HIGH (ref 70–99)

## 2023-02-03 LAB — HEMOGLOBIN A1C
Hgb A1c MFr Bld: 6.1 % — ABNORMAL HIGH (ref 4.8–5.6)
Mean Plasma Glucose: 128.37 mg/dL

## 2023-02-03 LAB — SURGICAL PCR SCREEN
MRSA, PCR: NEGATIVE
Staphylococcus aureus: NEGATIVE

## 2023-02-03 NOTE — Progress Notes (Addendum)
For Short Stay: COVID SWAB appointment date:  Bowel Prep reminder:   For Anesthesia: PCP - Melida Quitter, PA . LOV: 01/26/23 Cardiologist - Dr. Donato Schultz. LOV: 05/26/22  Chest x-ray -  EKG - 10/09/22 Stress Test -  ECHO - 08/03/22 Cardiac Cath -  Pacemaker/ICD device last checked: Pacemaker orders received: Device Rep notified:  Spinal Cord Stimulator:  Sleep Study - Yes CPAP - NO  Fasting Blood Sugar - 90 - 100's Checks Blood Sugar ____3_ times a week. Date and result of last Hgb A1c-  Last dose of GLP1 agonist- N/A GLP1 instructions:   Last dose of SGLT-2 inhibitors- N/A SGLT-2 instructions:   Blood Thinner Instructions: pletal : Will be on hold after: 02/08/23. Aspirin Instructions: Last Dose:  Activity level: Can go up a flight of stairs and activities of daily living without stopping and without chest pain and/or shortness of breath   Able to exercise without chest pain and/or shortness of breath  Anesthesia review: Hx: COPD,TMJ,DIA,OSA,HTN,Rapid heart beat.  Patient denies shortness of breath, fever, cough and chest pain at PAT appointment   Patient verbalized understanding of instructions that were given to them at the PAT appointment. Patient was also instructed that they will need to review over the PAT instructions again at home before surgery.

## 2023-02-04 NOTE — Telephone Encounter (Signed)
Please see the MyChart message reply(ies) for my assessment and plan.    This patient gave consent for this Medical Advice Message and is aware that it may result in a bill to [employer], as well as the possibility of receiving a bill for a co-payment or deductible. They are an established patient, but are not seeking medical advice exclusively about a problem treated during an in person or video visit in the last seven days. I did not recommend an in person or video visit within seven days of my reply.    I spent a total of 15 minutes cumulative time within 7 days through [employer].  Patient continues to use the same message chain which initially started 1 month ago.  Total of 30 minutes now spent communicating and coordinating care in response to patient's messages.  Melida Quitter, PA

## 2023-02-05 NOTE — Anesthesia Preprocedure Evaluation (Addendum)
Anesthesia Evaluation  Patient identified by MRN, date of birth, ID band Patient awake    Reviewed: Allergy & Precautions, H&P , NPO status , Patient's Chart, lab work & pertinent test results, reviewed documented beta blocker date and time   Airway Mallampati: II  TM Distance: >3 FB Neck ROM: Full    Dental  (+) Teeth Intact, Dental Advisory Given   Pulmonary sleep apnea (no longer wearing CPAP after weight loss in 2017) , COPD, former smoker   Pulmonary exam normal breath sounds clear to auscultation       Cardiovascular hypertension (168/86 preop, normally 140s SBP), Pt. on medications and Pt. on home beta blockers Normal cardiovascular exam Rhythm:Regular Rate:Normal     Neuro/Psych  Headaches PSYCHIATRIC DISORDERS Anxiety Depression       GI/Hepatic Neg liver ROS,GERD  Controlled and Medicated,,  Endo/Other  diabetes, Well Controlled, Type 2  Obesity BMI 35  Renal/GU Renal InsufficiencyRenal diseaseCr 1.27  negative genitourinary   Musculoskeletal  (+) Arthritis , Osteoarthritis,    Abdominal  (+) + obese  Peds negative pediatric ROS (+)  Hematology negative hematology ROS (+) Hb 14.1, plt 388   Anesthesia Other Findings   Reproductive/Obstetrics negative OB ROS                             Anesthesia Physical Anesthesia Plan  ASA: 3  Anesthesia Plan: Spinal, Regional and MAC   Post-op Pain Management: Tylenol PO (pre-op)* and Regional block*   Induction:   PONV Risk Score and Plan: 2 and Propofol infusion and TIVA  Airway Management Planned: Natural Airway and Nasal Cannula  Additional Equipment: None  Intra-op Plan:   Post-operative Plan:   Informed Consent: I have reviewed the patients History and Physical, chart, labs and discussed the procedure including the risks, benefits and alternatives for the proposed anesthesia with the patient or authorized representative who  has indicated his/her understanding and acceptance.       Plan Discussed with: CRNA  Anesthesia Plan Comments: (See PAT note from 9/25 by Sherlie Ban PA-C )        Anesthesia Quick Evaluation

## 2023-02-05 NOTE — Progress Notes (Signed)
DISCUSSION: Wendy Hubbard is a 67 yo female who presents to PAT prior to L TKA with Dr. Sherlean Foot. PMH of former smoking, COPD, OSA (does not use CPAP), GERD, DM, anxiety, depression  Patient saw Cardiology on 05/26/2022 due to tachycardia and chest pain. CTA coronaries showed mild non-obstructive CAD. Holter monitor showed brief atrial tachycardia. Echo showed normal EF with mild LVH, grade I DD. Advised to increase her statin dose.  Of note patient's WBC is 14.3. She recently had a fall and was seen by Ortho. Diagnosed with a "tailbone injury" and probably nondisplaced elbow fracture. Conservative management recommended and she was prescribed a Medrol dose pack on 9/23 which is likely the etiology of her mild leukocytosis.   VS: BP (!) 168/86 Comment: pt. did not take BP med. last night  Pulse 67   Temp 36.5 C (Oral)   Ht 5\' 4"  (1.626 m)   Wt 93.4 kg   SpO2 96%   BMI 35.36 kg/m   PROVIDERS: Melida Quitter, PA   LABS: Labs reviewed: Acceptable for surgery. Mild AKI. Mild leukocytosis - likely steroid induced (all labs ordered are listed, but only abnormal results are displayed)  Labs Reviewed  HEMOGLOBIN A1C - Abnormal; Notable for the following components:      Result Value   Hgb A1c MFr Bld 6.1 (*)    All other components within normal limits  CBC WITH DIFFERENTIAL/PLATELET - Abnormal; Notable for the following components:   WBC 14.3 (*)    Neutro Abs 10.7 (*)    Abs Immature Granulocytes 0.08 (*)    All other components within normal limits  COMPREHENSIVE METABOLIC PANEL - Abnormal; Notable for the following components:   Glucose, Bld 138 (*)    Creatinine, Ser 1.27 (*)    GFR, Estimated 46 (*)    All other components within normal limits  GLUCOSE, CAPILLARY - Abnormal; Notable for the following components:   Glucose-Capillary 150 (*)    All other components within normal limits  SURGICAL PCR SCREEN     IMAGES:   EKG:   CV:  CTA coronaries  06/01/22:  IMPRESSION: 1. Coronary artery calcium score 8.39 Agatston units. This places the patient in the 56th percentile for age and gender, suggesting intermediate risk for future cardiac events.   2. Nonobstructive mild disease in the proximal LAD, primarily noncalcified plaque.  Holter monitor 06/26/2022:    Sinus rhythm average rate 92 bpm.   Brief episodes, 2 of of atrial tachycardia.  Benign.   Rare PACs, rare PVCs.   No atrial fibrillation detected   Symptomatic episodes were associated with sinus rhythm/mild sinus tachycardia-benign  Echo 08/03/22: IMPRESSIONS     1. Left ventricular ejection fraction, by estimation, is 60 to 65%. The  left ventricle has normal function. The left ventricle has no regional  wall motion abnormalities. There is mild left ventricular hypertrophy.  Left ventricular diastolic parameters  are consistent with Grade I diastolic dysfunction (impaired relaxation).   2. Right ventricular systolic function is normal. The right ventricular  size is normal.   3. The mitral valve is normal in structure. No evidence of mitral valve  regurgitation.   4. The aortic valve is tricuspid. Aortic valve regurgitation is not  visualized.   5. The inferior vena cava is normal in size with greater than 50%  respiratory variability, suggesting right atrial pressure of 3 mmHg.   Past Medical History:  Diagnosis Date   Allergy    Phreesia 07/23/2020   Anxiety  Phreesia 07/23/2020   COPD (chronic obstructive pulmonary disease) (HCC)    DDD (degenerative disc disease), thoracic    Depression    Phreesia 07/23/2020   Diabetes mellitus without complication (HCC)    type 2   GERD (gastroesophageal reflux disease)    Hyperlipidemia    Nausea and vomiting 03/04/2022   Neuromuscular disorder (HCC)    severe neuropathy feet   Pneumonia    Rapid heart rate    Sleep apnea    no longer uses CPAP due to weight loss   TMJ (temporomandibular joint disorder)  11/28/2022   Wears contact lenses     Past Surgical History:  Procedure Laterality Date   ABDOMINAL HYSTERECTOMY     ANKLE ARTHROSCOPY Bilateral    APPENDECTOMY     BLADDER SUSPENSION     BREAST EXCISIONAL BIOPSY Left 1990   neg   BREAST EXCISIONAL BIOPSY Right 1989   neg   BREAST SURGERY     CHOLECYSTECTOMY     COLONOSCOPY N/A 11/04/2015   Procedure: COLONOSCOPY;  Surgeon: Scot Jun, MD;  Location: [facility] END[facility]COPY;  Service: Endoscopy;  Laterality: N/A;   COLONOSCOPY WITH PROPOFOL N/A 08/30/2020   Procedure: COLONOSCOPY WITH PROPOFOL;  Surgeon: Midge Minium, MD;  Location: [facility];  Service: Endoscopy;  Laterality: N/A;   ESOPHAGOGASTRODUODENOSCOPY N/A 11/02/2015   Procedure: ESOPHAGOGASTRODUODENOSCOPY (EGD);  Surgeon: Scot Jun, MD;  Location: [facility] ENDOSCOPY;  Service: Endoscopy;  Laterality: N/A;   ESOPHAGOGASTRODUODENOSCOPY (EGD) WITH PROPOFOL N/A 03/16/2022   Procedure: ESOPHAGOGASTRODUODENOSCOPY (EGD) WITH PROPOFOL foregin body removal;  Surgeon: Midge Minium, MD;  Location: [facility];  Service: Endoscopy;  Laterality: N/A;  Diabetic   GASTROJEJUNOSTOMY N/A 08/25/2021   Procedure: LAPAROSCOPIC REDO OF STOMACH TO INTESTINE;  Surgeon: Sheliah Hatch, De Blanch, MD;  Location: [facility];  Service: General;  Laterality: N/A;   KNEE ARTHROSCOPY Left    PARTIAL GASTRECTOMY N/A 08/25/2021   Procedure: PARTIAL GASTRECTOMY;  Surgeon: Sheliah Hatch, De Blanch, MD;  Location: [facility];  Service: General;  Laterality: N/A;   POLYPECTOMY  08/30/2020   Procedure: POLYPECTOMY;  Surgeon: Midge Minium, MD;  Location: [facility];  Service: Endoscopy;;   REDUCTION MAMMAPLASTY Bilateral 1988   TONSILLECTOMY     UPPER GI ENDOSCOPY  08/25/2021   Procedure: UPPER GI ENDOSCOPY;  Surgeon: Rodman Pickle, MD;  Location: [facility];  Service: General;;    MEDICATIONS:  alosetron (LOTRONEX) 1 MG tablet   ALPRAZolam (XANAX) 0.5 MG tablet   Blood Glucose Monitoring Suppl  DEVI   cilostazol (PLETAL) 100 MG tablet   desloratadine (CLARINEX) 5 MG tablet   DULoxetine (CYMBALTA) 20 MG capsule   escitalopram (LEXAPRO) 10 MG tablet   furosemide (LASIX) 20 MG tablet   Glucose Blood (BLOOD GLUCOSE TEST STRIPS) STRP   HYDROcodone-acetaminophen (NORCO/VICODIN) 5-325 MG tablet   Lancet Device MISC   Lancets Misc. MISC   loperamide (IMODIUM) 2 MG capsule   methocarbamol (ROBAXIN) 750 MG tablet   metoprolol succinate (TOPROL-XL) 50 MG 24 hr tablet   mirtazapine (REMERON) 15 MG tablet   montelukast (SINGULAIR) 10 MG tablet   pantoprazole (PROTONIX) 40 MG tablet   pregabalin (LYRICA) 50 MG capsule   rOPINIRole (REQUIP) 2 MG tablet   rosuvastatin (CRESTOR) 10 MG tablet   Specialty Vitamins Products (HAIR BOOSTER PO)   No current facility-administered medications for this encounter.   Marcille Blanco [facility] Short Stay/Anesthesiology [facility] Phone [phone] 02/05/2023 1:09 PM

## 2023-02-15 ENCOUNTER — Other Ambulatory Visit: Payer: Self-pay

## 2023-02-15 ENCOUNTER — Ambulatory Visit (HOSPITAL_COMMUNITY): Payer: Medicare Other | Admitting: Medical

## 2023-02-15 ENCOUNTER — Observation Stay (HOSPITAL_COMMUNITY)
Admission: RE | Admit: 2023-02-15 | Discharge: 2023-02-17 | Disposition: A | Discharge status: Home / Self Care | Payer: Medicare Other | Source: Ambulatory Visit | Attending: Orthopedic Surgery | Admitting: Orthopedic Surgery

## 2023-02-15 ENCOUNTER — Encounter (HOSPITAL_COMMUNITY)
Admission: RE | Disposition: A | Discharge status: Home / Self Care | Payer: Self-pay | Source: Ambulatory Visit | Attending: Orthopedic Surgery

## 2023-02-15 ENCOUNTER — Encounter (HOSPITAL_COMMUNITY): Payer: Self-pay | Admitting: Orthopedic Surgery

## 2023-02-15 ENCOUNTER — Ambulatory Visit (HOSPITAL_COMMUNITY): Payer: Medicare Other | Admitting: Anesthesiology

## 2023-02-15 DIAGNOSIS — Z79899 Other long term (current) drug therapy: Secondary | ICD-10-CM | POA: Insufficient documentation

## 2023-02-15 DIAGNOSIS — J449 Chronic obstructive pulmonary disease, unspecified: Secondary | ICD-10-CM | POA: Diagnosis not present

## 2023-02-15 DIAGNOSIS — M1712 Unilateral primary osteoarthritis, left knee: Principal | ICD-10-CM | POA: Insufficient documentation

## 2023-02-15 DIAGNOSIS — E119 Type 2 diabetes mellitus without complications: Secondary | ICD-10-CM | POA: Insufficient documentation

## 2023-02-15 DIAGNOSIS — Z87891 Personal history of nicotine dependence: Secondary | ICD-10-CM | POA: Diagnosis not present

## 2023-02-15 DIAGNOSIS — Z96652 Presence of left artificial knee joint: Secondary | ICD-10-CM

## 2023-02-15 DIAGNOSIS — Z96659 Presence of unspecified artificial knee joint: Principal | ICD-10-CM

## 2023-02-15 DIAGNOSIS — I10 Essential (primary) hypertension: Secondary | ICD-10-CM | POA: Insufficient documentation

## 2023-02-15 HISTORY — PX: TOTAL KNEE ARTHROPLASTY: SHX125

## 2023-02-15 LAB — GLUCOSE, CAPILLARY
Glucose-Capillary: 120 mg/dL — ABNORMAL HIGH (ref 70–99)
Glucose-Capillary: 136 mg/dL — ABNORMAL HIGH (ref 70–99)
Glucose-Capillary: 137 mg/dL — ABNORMAL HIGH (ref 70–99)

## 2023-02-15 SURGERY — ARTHROPLASTY, KNEE, TOTAL
Anesthesia: Monitor Anesthesia Care | Site: Knee | Laterality: Left

## 2023-02-15 MED ORDER — DEXAMETHASONE SODIUM PHOSPHATE 10 MG/ML IJ SOLN
10.0000 mg | Freq: Once | INTRAMUSCULAR | Status: AC
  Administered 2023-02-16: 10 mg via INTRAVENOUS
  Filled 2023-02-15: qty 1

## 2023-02-15 MED ORDER — METHOCARBAMOL 500 MG IVPB - SIMPLE MED
INTRAVENOUS | Status: AC
  Filled 2023-02-15: qty 55

## 2023-02-15 MED ORDER — PHENYLEPHRINE HCL (PRESSORS) 10 MG/ML IV SOLN
INTRAVENOUS | Status: DC | PRN
Start: 2023-02-15 — End: 2023-02-15
  Administered 2023-02-15: 100 ug via INTRAVENOUS

## 2023-02-15 MED ORDER — PREGABALIN 50 MG PO CAPS
50.0000 mg | ORAL_CAPSULE | Freq: Two times a day (BID) | ORAL | Status: DC
  Administered 2023-02-15 – 2023-02-17 (×4): 50 mg via ORAL
  Filled 2023-02-15 (×4): qty 1

## 2023-02-15 MED ORDER — OXYCODONE HCL 5 MG/5ML PO SOLN: 5.0000 mg | Freq: Once | ORAL | Status: AC | PRN

## 2023-02-15 MED ORDER — FLEET ENEMA RE ENEM: 1.0000 | ENEMA | Freq: Once | RECTAL | Status: DC | PRN

## 2023-02-15 MED ORDER — ONDANSETRON HCL 4 MG/2ML IJ SOLN: 4.0000 mg | Freq: Four times a day (QID) | INTRAMUSCULAR | Status: DC | PRN

## 2023-02-15 MED ORDER — ROPIVACAINE HCL 5 MG/ML IJ SOLN
INTRAMUSCULAR | Status: DC | PRN
Start: 2023-02-15 — End: 2023-02-15
  Administered 2023-02-15: 30 mL via PERINEURAL

## 2023-02-15 MED ORDER — MENTHOL 3 MG MT LOZG: 1.0000 | LOZENGE | OROMUCOSAL | Status: DC | PRN

## 2023-02-15 MED ORDER — ACETAMINOPHEN 500 MG PO TABS
1000.0000 mg | ORAL_TABLET | Freq: Once | ORAL | Status: DC
  Filled 2023-02-15: qty 2

## 2023-02-15 MED ORDER — MIDAZOLAM HCL 2 MG/2ML IJ SOLN
INTRAMUSCULAR | Status: AC
  Filled 2023-02-15: qty 2

## 2023-02-15 MED ORDER — ALUM & MAG HYDROXIDE-SIMETH 200-200-20 MG/5ML PO SUSP: 30.0000 mL | ORAL | Status: DC | PRN

## 2023-02-15 MED ORDER — ACETAMINOPHEN 500 MG PO TABS
1000.0000 mg | ORAL_TABLET | Freq: Four times a day (QID) | ORAL | Status: AC
  Administered 2023-02-15 – 2023-02-16 (×4): 1000 mg via ORAL
  Filled 2023-02-15 (×4): qty 2

## 2023-02-15 MED ORDER — PROPOFOL 1000 MG/100ML IV EMUL
INTRAVENOUS | Status: AC
  Filled 2023-02-15: qty 100

## 2023-02-15 MED ORDER — BUPIVACAINE LIPOSOME 1.3 % IJ SUSP: 20.0000 mL | Freq: Once | INTRAMUSCULAR | Status: DC

## 2023-02-15 MED ORDER — OXYCODONE HCL 5 MG PO TABS: 5.0000 mg | ORAL_TABLET | Freq: Once | ORAL | Status: AC | PRN

## 2023-02-15 MED ORDER — ONDANSETRON HCL 4 MG/2ML IJ SOLN
INTRAMUSCULAR | Status: DC | PRN
  Administered 2023-02-15: 4 mg via INTRAVENOUS

## 2023-02-15 MED ORDER — FENTANYL CITRATE (PF) 100 MCG/2ML IJ SOLN
INTRAMUSCULAR | Status: AC
  Filled 2023-02-15: qty 2

## 2023-02-15 MED ORDER — PANTOPRAZOLE SODIUM 40 MG PO TBEC
40.0000 mg | DELAYED_RELEASE_TABLET | Freq: Every day | ORAL | Status: DC
  Administered 2023-02-15 – 2023-02-16 (×2): 40 mg via ORAL
  Filled 2023-02-15 (×2): qty 1

## 2023-02-15 MED ORDER — PHENYLEPHRINE 80 MCG/ML (10ML) SYRINGE FOR IV PUSH (FOR BLOOD PRESSURE SUPPORT)
PREFILLED_SYRINGE | INTRAVENOUS | Status: AC
  Filled 2023-02-15: qty 10

## 2023-02-15 MED ORDER — SENNOSIDES-DOCUSATE SODIUM 8.6-50 MG PO TABS: 1.0000 | ORAL_TABLET | Freq: Every evening | ORAL | Status: DC | PRN

## 2023-02-15 MED ORDER — DULOXETINE HCL 20 MG PO CPEP
20.0000 mg | ORAL_CAPSULE | Freq: Every evening | ORAL | Status: DC
  Administered 2023-02-15 – 2023-02-16 (×2): 20 mg via ORAL
  Filled 2023-02-15 (×2): qty 1

## 2023-02-15 MED ORDER — FENTANYL CITRATE (PF) 100 MCG/2ML IJ SOLN
INTRAMUSCULAR | Status: DC | PRN
  Administered 2023-02-15: 75 ug via INTRAVENOUS
  Administered 2023-02-15: 25 ug via INTRAVENOUS

## 2023-02-15 MED ORDER — DOCUSATE SODIUM 100 MG PO CAPS
100.0000 mg | ORAL_CAPSULE | Freq: Two times a day (BID) | ORAL | Status: DC
  Administered 2023-02-15 – 2023-02-17 (×4): 100 mg via ORAL
  Filled 2023-02-15 (×4): qty 1

## 2023-02-15 MED ORDER — OXYCODONE HCL 5 MG PO TABS
5.0000 mg | ORAL_TABLET | ORAL | Status: DC | PRN
  Administered 2023-02-15 – 2023-02-17 (×11): 10 mg via ORAL
  Filled 2023-02-15 (×11): qty 2

## 2023-02-15 MED ORDER — CHLORHEXIDINE GLUCONATE 0.12 % MT SOLN
15.0000 mL | Freq: Once | OROMUCOSAL | Status: AC
  Administered 2023-02-15: 15 mL via OROMUCOSAL

## 2023-02-15 MED ORDER — DEXAMETHASONE SODIUM PHOSPHATE 10 MG/ML IJ SOLN
8.0000 mg | Freq: Once | INTRAMUSCULAR | Status: AC
  Administered 2023-02-15: 8 mg via INTRAVENOUS

## 2023-02-15 MED ORDER — 0.9 % SODIUM CHLORIDE (POUR BTL) OPTIME
TOPICAL | Status: DC | PRN
Start: 2023-02-15 — End: 2023-02-15
  Administered 2023-02-15: 1000 mL

## 2023-02-15 MED ORDER — SODIUM CHLORIDE (PF) 0.9 % IJ SOLN
INTRAMUSCULAR | Status: AC
  Filled 2023-02-15: qty 50

## 2023-02-15 MED ORDER — METHOCARBAMOL 500 MG PO TABS
1000.0000 mg | ORAL_TABLET | Freq: Four times a day (QID) | ORAL | Status: DC | PRN
  Administered 2023-02-15 – 2023-02-17 (×5): 1000 mg via ORAL
  Filled 2023-02-15 (×5): qty 2

## 2023-02-15 MED ORDER — FERROUS SULFATE 325 (65 FE) MG PO TABS
325.0000 mg | ORAL_TABLET | Freq: Three times a day (TID) | ORAL | Status: DC
  Administered 2023-02-15 – 2023-02-17 (×7): 325 mg via ORAL
  Filled 2023-02-15 (×7): qty 1

## 2023-02-15 MED ORDER — DEXAMETHASONE SODIUM PHOSPHATE 10 MG/ML IJ SOLN
INTRAMUSCULAR | Status: AC
  Filled 2023-02-15: qty 1

## 2023-02-15 MED ORDER — SODIUM CHLORIDE 0.9 % IR SOLN
Status: DC | PRN
  Administered 2023-02-15: 1000 mL

## 2023-02-15 MED ORDER — BUPIVACAINE LIPOSOME 1.3 % IJ SUSP
INTRAMUSCULAR | Status: AC
  Filled 2023-02-15: qty 20

## 2023-02-15 MED ORDER — METHOCARBAMOL 500 MG IVPB - SIMPLE MED
500.0000 mg | Freq: Four times a day (QID) | INTRAVENOUS | Status: DC | PRN
  Administered 2023-02-15: 500 mg via INTRAVENOUS

## 2023-02-15 MED ORDER — POVIDONE-IODINE 10 % EX SWAB: 2.0000 | Freq: Once | CUTANEOUS | Status: DC

## 2023-02-15 MED ORDER — KETOROLAC TROMETHAMINE 0.5 % OP SOLN
1.0000 [drp] | Freq: Four times a day (QID) | OPHTHALMIC | Status: DC
  Administered 2023-02-15: 1 [drp] via OPHTHALMIC
  Filled 2023-02-15: qty 5

## 2023-02-15 MED ORDER — INSULIN ASPART 100 UNIT/ML IJ SOLN: 0.0000 [IU] | INTRAMUSCULAR | Status: DC | PRN

## 2023-02-15 MED ORDER — MONTELUKAST SODIUM 10 MG PO TABS
10.0000 mg | ORAL_TABLET | Freq: Every day | ORAL | Status: DC
  Administered 2023-02-15 – 2023-02-16 (×2): 10 mg via ORAL
  Filled 2023-02-15 (×2): qty 1

## 2023-02-15 MED ORDER — AMISULPRIDE (ANTIEMETIC) 5 MG/2ML IV SOLN: 10.0000 mg | Freq: Once | INTRAVENOUS | Status: DC | PRN

## 2023-02-15 MED ORDER — ESCITALOPRAM OXALATE 10 MG PO TABS
10.0000 mg | ORAL_TABLET | Freq: Every evening | ORAL | Status: DC
  Administered 2023-02-15 – 2023-02-16 (×2): 10 mg via ORAL
  Filled 2023-02-15 (×2): qty 1

## 2023-02-15 MED ORDER — HYDROMORPHONE HCL 1 MG/ML IJ SOLN
0.5000 mg | INTRAMUSCULAR | Status: DC | PRN
  Administered 2023-02-15 (×2): 1 mg via INTRAVENOUS
  Filled 2023-02-15 (×2): qty 1

## 2023-02-15 MED ORDER — LACTATED RINGERS IV SOLN: INTRAVENOUS | Status: DC

## 2023-02-15 MED ORDER — TRANEXAMIC ACID-NACL 1000-0.7 MG/100ML-% IV SOLN
1000.0000 mg | INTRAVENOUS | Status: AC
  Administered 2023-02-15: 1000 mg via INTRAVENOUS
  Filled 2023-02-15: qty 100

## 2023-02-15 MED ORDER — WATER FOR IRRIGATION, STERILE IR SOLN
Status: DC | PRN
  Administered 2023-02-15: 2000 mL

## 2023-02-15 MED ORDER — PHENYLEPHRINE HCL-NACL 20-0.9 MG/250ML-% IV SOLN
INTRAVENOUS | Status: DC | PRN
Start: 2023-02-15 — End: 2023-02-15
  Administered 2023-02-15: 20 ug/min via INTRAVENOUS

## 2023-02-15 MED ORDER — HYDROMORPHONE HCL 1 MG/ML IJ SOLN
0.2500 mg | INTRAMUSCULAR | Status: DC | PRN
  Administered 2023-02-15 (×2): 0.5 mg via INTRAVENOUS

## 2023-02-15 MED ORDER — OXYCODONE HCL 5 MG PO TABS
ORAL_TABLET | ORAL | Status: AC
  Administered 2023-02-15: 5 mg via ORAL
  Filled 2023-02-15: qty 1

## 2023-02-15 MED ORDER — ACETAMINOPHEN 500 MG PO TABS
1000.0000 mg | ORAL_TABLET | Freq: Once | ORAL | Status: AC
  Administered 2023-02-15: 1000 mg via ORAL

## 2023-02-15 MED ORDER — ONDANSETRON HCL 4 MG/2ML IJ SOLN
INTRAMUSCULAR | Status: AC
  Filled 2023-02-15: qty 2

## 2023-02-15 MED ORDER — BSS IO SOLN
15.0000 mL | Freq: Once | INTRAOCULAR | Status: AC
  Administered 2023-02-15: 15 mL
  Filled 2023-02-15: qty 15

## 2023-02-15 MED ORDER — GABAPENTIN 300 MG PO CAPS
300.0000 mg | ORAL_CAPSULE | Freq: Once | ORAL | Status: AC
  Administered 2023-02-15: 300 mg via ORAL
  Filled 2023-02-15: qty 1

## 2023-02-15 MED ORDER — MIDAZOLAM HCL 5 MG/5ML IJ SOLN
INTRAMUSCULAR | Status: DC | PRN
  Administered 2023-02-15: 2 mg via INTRAVENOUS

## 2023-02-15 MED ORDER — CILOSTAZOL 100 MG PO TABS
100.0000 mg | ORAL_TABLET | Freq: Two times a day (BID) | ORAL | Status: DC
  Administered 2023-02-16 – 2023-02-17 (×3): 100 mg via ORAL
  Filled 2023-02-15 (×3): qty 1

## 2023-02-15 MED ORDER — BISACODYL 5 MG PO TBEC: 5.0000 mg | DELAYED_RELEASE_TABLET | Freq: Every day | ORAL | Status: DC | PRN

## 2023-02-15 MED ORDER — HYDROMORPHONE HCL 1 MG/ML IJ SOLN
INTRAMUSCULAR | Status: AC
  Administered 2023-02-15: 0.5 mg via INTRAVENOUS
  Filled 2023-02-15: qty 1

## 2023-02-15 MED ORDER — DEXAMETHASONE SODIUM PHOSPHATE 10 MG/ML IJ SOLN
INTRAMUSCULAR | Status: DC | PRN
Start: 2023-02-15 — End: 2023-02-15
  Administered 2023-02-15: 10 mg

## 2023-02-15 MED ORDER — SODIUM CHLORIDE 0.9 % IV SOLN: INTRAVENOUS | Status: DC

## 2023-02-15 MED ORDER — DIPHENHYDRAMINE HCL 12.5 MG/5ML PO ELIX: 12.5000 mg | ORAL_SOLUTION | ORAL | Status: DC | PRN

## 2023-02-15 MED ORDER — BUPIVACAINE-EPINEPHRINE 0.25% -1:200000 IJ SOLN
INTRAMUSCULAR | Status: AC
  Filled 2023-02-15: qty 1

## 2023-02-15 MED ORDER — ROSUVASTATIN CALCIUM 10 MG PO TABS
10.0000 mg | ORAL_TABLET | Freq: Every day | ORAL | Status: DC
  Administered 2023-02-15 – 2023-02-16 (×2): 10 mg via ORAL
  Filled 2023-02-15 (×3): qty 1

## 2023-02-15 MED ORDER — METOPROLOL SUCCINATE ER 50 MG PO TB24
50.0000 mg | ORAL_TABLET | Freq: Every evening | ORAL | Status: DC
  Administered 2023-02-15 – 2023-02-16 (×2): 50 mg via ORAL
  Filled 2023-02-15 (×3): qty 1

## 2023-02-15 MED ORDER — METOCLOPRAMIDE HCL 5 MG/ML IJ SOLN: 5.0000 mg | Freq: Three times a day (TID) | INTRAMUSCULAR | Status: DC | PRN

## 2023-02-15 MED ORDER — MIRTAZAPINE 15 MG PO TABS
15.0000 mg | ORAL_TABLET | Freq: Every day | ORAL | Status: DC
  Administered 2023-02-15 – 2023-02-16 (×2): 15 mg via ORAL
  Filled 2023-02-15 (×2): qty 1

## 2023-02-15 MED ORDER — PROPOFOL 500 MG/50ML IV EMUL
INTRAVENOUS | Status: DC | PRN
  Administered 2023-02-15 (×2): 45 mg via INTRAVENOUS
  Administered 2023-02-15: 30 ug/kg/min via INTRAVENOUS

## 2023-02-15 MED ORDER — METOCLOPRAMIDE HCL 5 MG PO TABS: 5.0000 mg | ORAL_TABLET | Freq: Three times a day (TID) | ORAL | Status: DC | PRN

## 2023-02-15 MED ORDER — FUROSEMIDE 20 MG PO TABS
20.0000 mg | ORAL_TABLET | Freq: Every day | ORAL | Status: DC
  Administered 2023-02-15 – 2023-02-17 (×3): 20 mg via ORAL
  Filled 2023-02-15 (×3): qty 1

## 2023-02-15 MED ORDER — ONDANSETRON HCL 4 MG/2ML IJ SOLN: 4.0000 mg | Freq: Once | INTRAMUSCULAR | Status: DC | PRN

## 2023-02-15 MED ORDER — ORAL CARE MOUTH RINSE: 15.0000 mL | Freq: Once | OROMUCOSAL | Status: AC

## 2023-02-15 MED ORDER — APIXABAN 2.5 MG PO TABS
2.5000 mg | ORAL_TABLET | Freq: Two times a day (BID) | ORAL | Status: DC
  Administered 2023-02-16 – 2023-02-17 (×3): 2.5 mg via ORAL
  Filled 2023-02-15 (×3): qty 1

## 2023-02-15 MED ORDER — ONDANSETRON HCL 4 MG PO TABS: 4.0000 mg | ORAL_TABLET | Freq: Four times a day (QID) | ORAL | Status: DC | PRN

## 2023-02-15 MED ORDER — PHENOL 1.4 % MT LIQD: 1.0000 | OROMUCOSAL | Status: DC | PRN

## 2023-02-15 MED ORDER — ALOSETRON HCL 1 MG PO TABS: 1.0000 mg | ORAL_TABLET | Freq: Two times a day (BID) | ORAL | Status: DC

## 2023-02-15 MED ORDER — PANTOPRAZOLE SODIUM 40 MG PO TBEC: 40.0000 mg | DELAYED_RELEASE_TABLET | Freq: Every day | ORAL | Status: DC

## 2023-02-15 MED ORDER — VANCOMYCIN HCL IN DEXTROSE 1-5 GM/200ML-% IV SOLN
INTRAVENOUS | Status: AC
  Filled 2023-02-15: qty 200

## 2023-02-15 MED ORDER — ZOLPIDEM TARTRATE 5 MG PO TABS: 5.0000 mg | ORAL_TABLET | Freq: Every evening | ORAL | Status: DC | PRN

## 2023-02-15 MED ORDER — BUPIVACAINE IN DEXTROSE 0.75-8.25 % IT SOLN
INTRATHECAL | Status: DC | PRN
Start: 2023-02-15 — End: 2023-02-15
  Administered 2023-02-15: 1.8 mL via INTRATHECAL

## 2023-02-15 MED ORDER — VANCOMYCIN HCL 1000 MG IV SOLR
INTRAVENOUS | Status: DC | PRN
  Administered 2023-02-15: 1000 mg via INTRAVENOUS

## 2023-02-15 MED ORDER — ROPINIROLE HCL 1 MG PO TABS
2.0000 mg | ORAL_TABLET | Freq: Every day | ORAL | Status: DC
  Administered 2023-02-15 – 2023-02-16 (×2): 2 mg via ORAL
  Filled 2023-02-15 (×2): qty 2

## 2023-02-15 MED ORDER — SODIUM CHLORIDE (PF) 0.9 % IJ SOLN
INTRAMUSCULAR | Status: DC | PRN
  Administered 2023-02-15: 70 mL

## 2023-02-15 SURGICAL SUPPLY — 54 items
ARTISURF 11M PLY L 6-9CD KNEE (Knees) IMPLANT
BAG COUNTER SPONGE SURGICOUNT (BAG) IMPLANT
BAG SPEC THK2 15X12 ZIP CLS (MISCELLANEOUS) ×1
BAG SPNG CNTER NS LX DISP (BAG)
BAG ZIPLOCK 12X15 (MISCELLANEOUS) ×1 IMPLANT
BLADE SAGITTAL 13X1.27X60 (BLADE) ×1 IMPLANT
BLADE SAW SGTL 18X1.27X75 (BLADE) ×1 IMPLANT
BLADE SURG 15 STRL LF DISP TIS (BLADE) ×1 IMPLANT
BLADE SURG 15 STRL SS (BLADE) ×1
BNDG CMPR 6 X 5 YARDS HK CLSR (GAUZE/BANDAGES/DRESSINGS) ×1
BNDG ELASTIC 6INX 5YD STR LF (GAUZE/BANDAGES/DRESSINGS) ×1 IMPLANT
BOWL SMART MIX CTS (DISPOSABLE) ×1 IMPLANT
BSPLAT TIB 5D C CMNT STM LT (Knees) ×1 IMPLANT
CEMENT BONE R 1X40 (Cement) ×2 IMPLANT
COVER SURGICAL LIGHT HANDLE (MISCELLANEOUS) ×1 IMPLANT
CUFF TOURN SGL QUICK 34 (TOURNIQUET CUFF) ×1
CUFF TRNQT CYL 34X4.125X (TOURNIQUET CUFF) ×1 IMPLANT
DRAPE INCISE IOBAN 66X45 STRL (DRAPES) ×2 IMPLANT
DRAPE U-SHAPE 47X51 STRL (DRAPES) ×1 IMPLANT
DRSG AQUACEL AG ADV 3.5X10 (GAUZE/BANDAGES/DRESSINGS) ×1 IMPLANT
DURAPREP 26ML APPLICATOR (WOUND CARE) ×2 IMPLANT
ELECT REM PT RETURN 15FT ADLT (MISCELLANEOUS) ×1 IMPLANT
FEMUR CMT CCR STD SZ6 L KNEE (Knees) ×1 IMPLANT
FEMUR CMTD CCR STD SZ6 L KNEE (Knees) IMPLANT
GLOVE BIOGEL M 7.0 STRL (GLOVE) IMPLANT
GLOVE BIOGEL PI IND STRL 7.5 (GLOVE) IMPLANT
GLOVE BIOGEL PI IND STRL 8.5 (GLOVE) ×1 IMPLANT
GLOVE SURG ORTHO 8.0 STRL STRW (GLOVE) ×2 IMPLANT
GOWN STRL REUS W/ TWL XL LVL3 (GOWN DISPOSABLE) ×2 IMPLANT
GOWN STRL REUS W/TWL XL LVL3 (GOWN DISPOSABLE) ×2
HANDPIECE INTERPULSE COAX TIP (DISPOSABLE) ×1
HOLDER FOLEY CATH W/STRAP (MISCELLANEOUS) ×1 IMPLANT
HOOD PEEL AWAY T7 (MISCELLANEOUS) ×3 IMPLANT
KIT TURNOVER KIT A (KITS) IMPLANT
MANIFOLD NEPTUNE II (INSTRUMENTS) ×1 IMPLANT
NS IRRIG 1000ML POUR BTL (IV SOLUTION) ×1 IMPLANT
PACK TOTAL KNEE CUSTOM (KITS) ×1 IMPLANT
PROTECTOR NERVE ULNAR (MISCELLANEOUS) ×1 IMPLANT
SET HNDPC FAN SPRY TIP SCT (DISPOSABLE) ×1 IMPLANT
SPIKE FLUID TRANSFER (MISCELLANEOUS) ×2 IMPLANT
STEM POLY PAT PLY 29M KNEE (Knees) IMPLANT
STEM TIBIA 5 DEG SZ C L KNEE (Knees) IMPLANT
STRIP CLOSURE SKIN 1/2X4 (GAUZE/BANDAGES/DRESSINGS) ×1 IMPLANT
SUT BONE WAX W31G (SUTURE) ×1 IMPLANT
SUT MNCRL AB 3-0 PS2 18 (SUTURE) ×1 IMPLANT
SUT STRATAFIX 0 PDS 27 VIOLET (SUTURE) ×1
SUT STRATAFIX 1PDS 45CM VIOLET (SUTURE) ×1 IMPLANT
SUT VIC AB 1 CT1 36 (SUTURE) ×1 IMPLANT
SUTURE STRATFX 0 PDS 27 VIOLET (SUTURE) ×1 IMPLANT
TIBIA STEM 5 DEG SZ C L KNEE (Knees) ×1 IMPLANT
TRAY FOLEY MTR SLVR 16FR STAT (SET/KITS/TRAYS/PACK) ×1 IMPLANT
TUBE SUCTION HIGH CAP CLEAR NV (SUCTIONS) ×1 IMPLANT
WATER STERILE IRR 1000ML POUR (IV SOLUTION) ×2 IMPLANT
WRAP KNEE MAXI GEL POST OP (GAUZE/BANDAGES/DRESSINGS) ×2 IMPLANT

## 2023-02-15 NOTE — Progress Notes (Signed)
Orthopedic Tech Progress Note Patient Details:  Wendy Hubbard 1955/05/13 [id number]  CPM Left Knee CPM Left Knee: Off Left Knee Flexion (Degrees): 90 Left Knee Extension (Degrees): 0  Post Interventions Patient Tolerated: Well  Darleen Crocker 02/15/2023, 1:31 PM

## 2023-02-15 NOTE — Plan of Care (Signed)

## 2023-02-15 NOTE — Transfer of Care (Signed)
Immediate Anesthesia Transfer of Care Note  Patient: Wendy Hubbard  Procedure(s) Performed: TOTAL KNEE ARTHROPLASTY (Left: Knee)  Patient Location: PACU  Anesthesia Type:Spinal  Level of Consciousness: drowsy and patient cooperative  Airway & Oxygen Therapy: Patient Spontanous Breathing; Face mask O2  Post-op Assessment: Report given to RN and Post -op Vital signs reviewed and stable  Post vital signs: Reviewed and stable  Last Vitals:  Vitals Value Taken Time  BP 149/70 02/15/23 0852  Temp    Pulse 83 02/15/23 0854  Resp 10 02/15/23 0854  SpO2 97 % 02/15/23 0854  Vitals shown include unfiled device data.  Last Pain:  Vitals:   02/15/23 0647  TempSrc: Oral  PainSc:       Patients Stated Pain Goal: 4 (02/15/23 0545)  Complications: No notable events documented.

## 2023-02-15 NOTE — Op Note (Signed)
TOTAL KNEE REPLACEMENT OPERATIVE NOTE:  02/15/2023  9:04 AM  PATIENT:  Wendy Hubbard  67 y.o. female  PRE-OPERATIVE DIAGNOSIS:  Osteoarthritis left knee M17.12  POST-OPERATIVE DIAGNOSIS:  Osteoarthritis left knee  PROCEDURE:  Procedure(s): TOTAL KNEE ARTHROPLASTY  SURGEON:  Surgeon(s): Dannielle Huh, MD  PHYSICIAN ASSISTANT: Laurier Nancy, PA-C   ANESTHESIA:   spinal  SPECIMEN: None  COUNTS:  Correct  TOURNIQUET:   Total Tourniquet Time Documented: Thigh (Left) - 36 minutes Total: Thigh (Left) - 36 minutes   DICTATION:  Indication for procedure:    The patient is a 67 y.o. female who has failed conservative treatment for Osteoarthritis left knee M17.12.  Informed consent was obtained prior to anesthesia. The risks versus benefits of the operation were explain and in a way the patient can, and did, understand.     Description of procedure:     The patient was taken to the operating room and placed under anesthesia.  The patient was positioned in the usual fashion taking care that all body parts were adequately padded and/or protected.  A tourniquet was applied and the leg prepped and draped in the usual sterile fashion.  The extremity was exsanguinated with the esmarch and tourniquet inflated to 300 mmHg.  Pre-operative range of motion was normal.   A midline incision approximately 6-7 inches Salm was made with a #10 blade.  A new blade was used to make a parapatellar arthrotomy going 2-3 cm into the quadriceps tendon, over the patella, and alongside the medial aspect of the patellar tendon.  A synovectomy was then performed with the #10 blade and forceps. I then elevated the deep MCL off the medial tibial metaphysis subperiosteally around to the semimembranosus attachment.    I everted the patella and used calipers to measure patellar thickness.  I used the reamer to ream down to appropriate thickness to recreate the native thickness.  I then removed excess bone with the  rongeur and sagittal saw.  I used the appropriately sized template and drilled the three lug holes.  I then put the trial in place and measured the thickness with the calipers to ensure recreation of the native thickness.  The trial was then removed and the patella subluxed and the knee brought into flexion.  A homan retractor was place to retract and protect the patella and lateral structures.  A Z-retractor was place medially to protect the medial structures.  The extra-medullary alignment system was used to make cut the tibial articular surface perpendicular to the anamotic axis of the tibia and in 3 degrees of posterior slope.  The cut surface and alignment jig was removed.  I then used the intramedullary alignment guide to make a 5 valgus cut on the distal femur.  I then marked out the epicondylar axis on the distal femur.  I then used the anterior referencing sizer and measured the femur to be a size 6.  The 4-In-1 cutting block was screwed into place in external rotation matching the posterior condylar angle, making our cuts perpendicular to the epicondylar axis.  Anterior, posterior and chamfer cuts were made with the sagittal saw.  The cutting block and cut pieces were removed.  A lamina spreader was placed in 90 degrees of flexion.  The ACL, PCL, menisci, and posterior condylar osteophytes were removed.  A 11 mm spacer blocked was found to offer good flexion and extension gap balance after minimal in degree releasing.   The scoop retractor was then placed and the femoral  finishing block was pinned in place.  The small sagittal saw was used as well as the lug drill to finish the femur.  The block and cut surfaces were removed and the medullary canal hole filled with autograft bone from the cut pieces.  The tibia was delivered forward in deep flexion and external rotation.  A size C tray was selected and pinned into place centered on the medial 1/3 of the tibial tubercle.  The reamer and keel was  used to prepare the tibia through the tray.    I then trialed with the size 6 femur, size C tibia, a 11 mm insert and the 29 patella.  I had excellent flexion/extension gap balance, excellent patella tracking.  Flexion was full and beyond 120 degrees; extension was zero.  These components were chosen and the staff opened them to me on the back table while the knee was lavaged copiously and the cement mixed.  The soft tissue was infiltrated with 60cc of exparel 1.3% through a 21 gauge needle.  I cemented in the components and removed all excess cement.  The polyethylene tibial component was snapped into place and the knee placed in extension while cement was hardening.  The capsule was infilltrated with a 60cc exparel/marcaine/saline mixture.   Once the cement was hard, the tourniquet was let down.  Hemostasis was obtained.  The arthrotomy was closed using a #1 stratofix running suture.  The deep soft tissues were closed with #0 vicryls and the subcuticular layer closed with #2-0 vicryl.  The skin was reapproximated and closed with 3.0 Monocryl.  The wound was covered with steristrips, aquacel dressing, and a TED stocking.   The patient was then awakened, extubated, and taken to the recovery room in stable condition.  BLOOD LOSS:  300cc COMPLICATIONS:  None.  PLAN OF CARE: Admit for overnight observation  PATIENT DISPOSITION:  PACU - hemodynamically stable.   Delay start of Pharmacological VTE agent (>24hrs) due to surgical blood loss or risk of bleeding:  yes  Please fax a copy of this op note to my office at [phone] (please only include page 1 and 2 of the Case Information op note)

## 2023-02-15 NOTE — Discharge Instructions (Signed)
Information on my medicine - ELIQUIS (apixaban)  Why was Eliquis prescribed for you? Eliquis was prescribed for you to reduce the risk of blood clots forming after orthopedic surgery.    What do You need to know about Eliquis? Take your Eliquis TWICE DAILY - one tablet in the morning and one tablet in the evening with or without food.  It would be best to take the dose about the same time each day.  If you have difficulty swallowing the tablet whole please discuss with your pharmacist how to take the medication safely.  Take Eliquis exactly as prescribed by your doctor and DO NOT stop taking Eliquis without talking to the doctor who prescribed the medication.  Stopping without other medication to take the place of Eliquis may increase your risk of developing a clot.  After discharge, you should have regular check-up appointments with your healthcare provider that is prescribing your Eliquis.  What do you do if you miss a dose? If a dose of ELIQUIS is not taken at the scheduled time, take it as soon as possible on the same day and twice-daily administration should be resumed.  The dose should not be doubled to make up for a missed dose.  Do not take more than one tablet of ELIQUIS at the same time.  Important Safety Information A possible side effect of Eliquis is bleeding. You should call your healthcare provider right away if you experience any of the following: Bleeding from an injury or your nose that does not stop. Unusual colored urine (red or dark brown) or unusual colored stools (red or black). Unusual bruising for unknown reasons. A serious fall or if you hit your head (even if there is no bleeding).  Some medicines may interact with Eliquis and might increase your risk of bleeding or clotting while on Eliquis. To help avoid this, consult your healthcare provider or pharmacist prior to using any new prescription or non-prescription medications, including herbals,  vitamins, non-steroidal anti-inflammatory drugs (NSAIDs) and supplements.  This website has more information on Eliquis (apixaban): http://www.eliquis.com/eliquis/home  

## 2023-02-15 NOTE — Anesthesia Procedure Notes (Signed)
Spinal  Patient location during procedure: OR Start time: 02/15/2023 7:25 AM End time: 02/15/2023 7:29 AM Reason for block: surgical anesthesia Staffing Performed: anesthesiologist  Anesthesiologist: Lannie Fields, DO Performed by: Lannie Fields, DO Authorized by: Lannie Fields, DO   Preanesthetic Checklist Completed: patient identified, IV checked, risks and benefits discussed, surgical consent, monitors and equipment checked, pre-op evaluation and timeout performed Spinal Block Patient position: sitting Prep: DuraPrep and site prepped and draped Patient monitoring: cardiac monitor, continuous pulse ox and blood pressure Approach: midline Location: L3-4 Injection technique: single-shot Needle Needle type: Pencan  Needle gauge: 24 G Needle length: 9 cm Assessment Sensory level: T6 Events: CSF return Additional Notes Functioning IV was confirmed and monitors were applied. Sterile prep and drape, including hand hygiene and sterile gloves were used. The patient was positioned and the spine was prepped. The skin was anesthetized with lidocaine.  Free flow of clear CSF was obtained prior to injecting local anesthetic into the CSF.  The spinal needle aspirated freely following injection.  The needle was carefully withdrawn.  The patient tolerated the procedure well.

## 2023-02-15 NOTE — Evaluation (Signed)
Physical Therapy Evaluation Patient Details Name: Wendy Hubbard MRN: [id number] DOB: 1955/06/20 Today's Date: 02/15/2023  History of Present Illness  67 y.o. female admitted 02/15/23 for L TKA. PMH: back pain, DM, partial gastrectomy  Clinical Impression  Pt is s/p TKA resulting in the deficits listed below (see PT Problem List). Pt ambulated 50' with RW, no loss of balance. Initiated TKA HEP. Good progress expected.  Pt will benefit from acute skilled PT to increase their independence and safety with mobility to allow discharge.          If plan is discharge home, recommend the following: A little help with walking and/or transfers;A little help with bathing/dressing/bathroom;Assistance with cooking/housework;Assist for transportation;Help with stairs or ramp for entrance   Can travel by private vehicle        Equipment Recommendations None recommended by PT  Recommendations for Other Services       Functional Status Assessment Patient has had a recent decline in their functional status and demonstrates the ability to make significant improvements in function in a reasonable and predictable amount of time.     Precautions / Restrictions Precautions Precautions: Knee;Fall Precaution Comments: reviewed no pillow under L knee Restrictions Weight Bearing Restrictions: No LLE Weight Bearing: Weight bearing as tolerated      Mobility  Bed Mobility Overal bed mobility: Modified Independent             General bed mobility comments: HOB up, used rail    Transfers Overall transfer level: Needs assistance Equipment used: Rolling walker (2 wheels) Transfers: Sit to/from Stand Sit to Stand: Contact guard assist           General transfer comment: VCs hand placement    Ambulation/Gait Ambulation/Gait assistance: Contact guard assist Gait Distance (Feet): 50 Feet Assistive device: Rolling walker (2 wheels) Gait Pattern/deviations: Step-to pattern, Decreased step  length - right, Decreased step length - left Gait velocity: decr     General Gait Details: VCs sequencing, no loss of balance  Stairs            Wheelchair Mobility     Tilt Bed    Modified Rankin (Stroke Patients Only)       Balance Overall balance assessment: Modified Independent                                           Pertinent Vitals/Pain Pain Assessment Pain Assessment: 0-10 Pain Score: 8  Pain Location: L knee with walking Pain Descriptors / Indicators: Sore Pain Intervention(s): Limited activity within patient's tolerance, Monitored during session, Premedicated before session, Ice applied    Home Living Family/patient expects to be discharged to:: Private residence Living Arrangements: Spouse/significant other     Home Access: Stairs to enter Entrance Stairs-Rails: Left Entrance Stairs-Number of Steps: 2   Home Layout: Able to live on main level with bedroom/bathroom;Two level Home Equipment: Agricultural consultant (2 wheels);Cane - single point      Prior Function Prior Level of Function : Independent/Modified Independent             Mobility Comments: walked without AD, 3 falls in past 6 months 2* L knee buckling ADLs Comments: independent     Extremity/Trunk Assessment   Upper Extremity Assessment Upper Extremity Assessment: Overall WFL for tasks assessed    Lower Extremity Assessment Lower Extremity Assessment: LLE deficits/detail LLE Deficits / Details: SLR 3/5,  knee ext at least 3/5, L knee AAROM ~5-65* LLE Sensation: history of peripheral neuropathy;decreased light touch    Cervical / Trunk Assessment Cervical / Trunk Assessment: Normal  Communication   Communication Communication: No apparent difficulties  Cognition Arousal: Alert Behavior During Therapy: WFL for tasks assessed/performed Overall Cognitive Status: Within Functional Limits for tasks assessed                                           General Comments      Exercises Total Joint Exercises Ankle Circles/Pumps: AROM, Both, 15 reps, Supine Heel Slides: AAROM, Left, 5 reps, Supine   Assessment/Plan    PT Assessment Patient needs continued PT services  PT Problem List Decreased strength;Decreased mobility;Decreased range of motion;Decreased activity tolerance;Decreased balance       PT Treatment Interventions Gait training;Therapeutic exercise;Patient/family education;Therapeutic activities;DME instruction    PT Goals (Current goals can be found in the Care Plan section)  Acute Rehab PT Goals Patient Stated Goal: go to husband's class reunion 10/18 PT Goal Formulation: With patient/family Time For Goal Achievement: 02/22/23 Potential to Achieve Goals: Good    Frequency 7X/week     Co-evaluation               AM-PAC PT "6 Clicks" Mobility  Outcome Measure Help needed turning from your back to your side while in a flat bed without using bedrails?: A Little Help needed moving from lying on your back to sitting on the side of a flat bed without using bedrails?: A Little Help needed moving to and from a bed to a chair (including a wheelchair)?: A Little Help needed standing up from a chair using your arms (e.g., wheelchair or bedside chair)?: A Little Help needed to walk in hospital room?: A Little Help needed climbing 3-5 steps with a railing? : A Lot 6 Click Score: 17    End of Session Equipment Utilized During Treatment: Gait belt Activity Tolerance: Patient tolerated treatment well Patient left: in chair;with chair alarm set;with call bell/phone within reach;with family/visitor present Nurse Communication: Mobility status PT Visit Diagnosis: Difficulty in walking, not elsewhere classified (R26.2);Pain Pain - Right/Left: Left Pain - part of body: Knee    Time: 1416-1440 PT Time Calculation (min) (ACUTE ONLY): 24 min   Charges:   PT Evaluation $PT Eval Moderate Complexity: 1 Mod PT  Treatments $Gait Training: 8-22 mins PT General Charges $$ ACUTE PT VISIT: 1 Visit        Tamala Ser PT 02/15/2023  Acute Rehabilitation Services  Office [phone]

## 2023-02-15 NOTE — Anesthesia Postprocedure Evaluation (Signed)
Anesthesia Post Note  Patient: Jezebelle Ledwell Holik  Procedure(s) Performed: TOTAL KNEE ARTHROPLASTY (Left: Knee)     Patient location during evaluation: PACU Anesthesia Type: Regional, MAC and Spinal Level of consciousness: awake and alert and oriented Pain management: pain level controlled Vital Signs Assessment: post-procedure vital signs reviewed and stable Respiratory status: spontaneous breathing, nonlabored ventilation and respiratory function stable Cardiovascular status: blood pressure returned to baseline and stable Postop Assessment: no headache, no backache, spinal receding and no apparent nausea or vomiting Anesthetic complications: no   No notable events documented.  Last Vitals:  Vitals:   02/15/23 0930 02/15/23 0945  BP: 126/68 134/77  Pulse: 83 79  Resp: 13 11  Temp:    SpO2: 95% 94%    Last Pain:  Vitals:   02/15/23 1000  TempSrc:   PainSc: 3     LLE Motor Response: Purposeful movement (02/15/23 1000) LLE Sensation: Decreased;Numbness (02/15/23 1000) RLE Motor Response: Purposeful movement (02/15/23 1000) RLE Sensation: Decreased;Numbness (02/15/23 1000) L Sensory Level: S2-Posterior medial thigh and lower leg (02/15/23 1000) R Sensory Level: S2-Posterior medial thigh and lower leg (02/15/23 1000)  Lannie Fields

## 2023-02-15 NOTE — H&P (Signed)
LYNNON SCHORN MRN:  [id number] DOB/SEX:  June 26, 1955/female  CHIEF COMPLAINT:  Painful left Knee  HISTORY: Patient is a 67 y.o. female presented with a history of pain in the left knee. Onset of symptoms was gradual starting a few years ago with gradually worsening course since that time. Patient has been treated conservatively with over-the-counter NSAIDs and activity modification. Patient currently rates pain in the knee at 10 out of 10 with activity. There is pain at night.  PAST MEDICAL HISTORY: Patient Active Problem List   Diagnosis Date Noted   TMJ (temporomandibular joint disorder) 11/28/2022   Lumbar facet joint pain 10/21/2022   Lumbosacral facet arthropathy (Multilevel) (Bilateral) 10/21/2022   Spondylosis without myelopathy or radiculopathy, lumbosacral region 10/21/2022   Perennial allergic rhinitis 10/16/2022   Abnormal NCS (nerve conduction studies) (06/14/2020) 08/05/2022   Abnormal MRI, lumbar spine (02/25/2022) 08/05/2022   Abnormal CT scan, lumbar spine (06/25/2022) 08/05/2022   Abnormal MRI, cervical spine (12/14/2019) 08/05/2022   Chronic low back pain (3ry area of Pain) (Bilateral) (L>R) w/o sciatica 08/05/2022   Chronic occipital headache (Left) 08/05/2022   Cervicogenic headache (Left) 08/05/2022   Injury of right middle finger 08/05/2022   Chronic upper extremity pain (7th area of Pain) (Left) 08/05/2022   Chronic pain syndrome 08/04/2022   Disorder of skeletal system 08/04/2022   Other fatigue 07/22/2022   Iron deficiency anemia secondary to inadequate dietary iron intake 07/22/2022   Rapid resting heart rate 05/31/2022   Abnormal ECG 05/31/2022   Gastric foreign body 03/16/2022   Mass of lower outer quadrant of left breast 03/04/2022   Chronic neck pain (4th area of Pain) (Bilateral) (L>R) 12/19/2021   Alopecia 12/19/2021   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   Mesenteric adenitis 03/30/2021   Chronic shoulder pain (5th area of Pain) (Bilateral) (L>R)  03/30/2021   Other intervertebral disc degeneration, lumbar region 01/09/2021   Spondylolisthesis of lumbar region 01/09/2021   Polyp of ascending colon    Recurrent major depressive disorder, in partial remission (HCC) 08/28/2020   Palpitations 07/29/2020   Restless legs syndrome 05/30/2020   Intermittent claudication (HCC) 05/08/2020   Aortic atherosclerosis (HCC) 05/08/2020   Sensorimotor neuropathy 05/03/2020   Functional diarrhea 03/07/2020   Vasomotor rhinitis 05/25/2019   Generalized anxiety disorder 07/27/2018   Vitamin D deficiency 07/27/2018   Seasonal allergic rhinitis due to pollen 02/28/2018   Migraine without aura and without status migrainosus, not intractable 12/21/2017   Chronic feet pain (1ry area of Pain) (Bilateral) 08/21/2017   Status post bariatric surgery 01/05/2017   Essential hypertension 12/05/2015   Hyperlipidemia associated with type 2 diabetes mellitus (HCC) 12/05/2015   Cobalamin deficiency 11/18/2015   Gastroesophageal reflux disease 10/22/2015   Morbid obesity (HCC) 10/22/2015   Obstructive sleep apnea syndrome 10/22/2015   Type 2 diabetes mellitus with peripheral neuropathy (HCC) 10/22/2015   Primary osteoarthritis of left knee 10/04/2014   Chronic knee pain (2ry area of Pain) (Left) 09/06/2014   Past Medical History:  Diagnosis Date   Allergy    Phreesia 07/23/2020   Anxiety    Phreesia 07/23/2020   COPD (chronic obstructive pulmonary disease) (HCC)    DDD (degenerative disc disease), thoracic    Depression    Phreesia 07/23/2020   Diabetes mellitus without complication (HCC)    type 2   GERD (gastroesophageal reflux disease)    Hyperlipidemia    Nausea and vomiting 03/04/2022   Neuromuscular disorder (HCC)    severe neuropathy feet   Pneumonia    Rapid  heart rate    Sleep apnea    no longer uses CPAP due to weight loss   TMJ (temporomandibular joint disorder) 11/28/2022   Wears contact lenses    Past Surgical History:  Procedure  Laterality Date   ABDOMINAL HYSTERECTOMY     ANKLE ARTHROSCOPY Bilateral    APPENDECTOMY     BLADDER SUSPENSION     BREAST EXCISIONAL BIOPSY Left 1990   neg   BREAST EXCISIONAL BIOPSY Right 1989   neg   BREAST SURGERY     CHOLECYSTECTOMY     COLONOSCOPY N/A 11/04/2015   Procedure: COLONOSCOPY;  Surgeon: Scot Jun, MD;  Location: [facility] ENDOSCOPY;  Service: Endoscopy;  Laterality: N/A;   COLONOSCOPY WITH PROPOFOL N/A 08/30/2020   Procedure: COLONOSCOPY WITH PROPOFOL;  Surgeon: Midge Minium, MD;  Location: [facility];  Service: Endoscopy;  Laterality: N/A;   ESOPHAGOGASTRODUODENOSCOPY N/A 11/02/2015   Procedure: ESOPHAGOGASTRODUODENOSCOPY (EGD);  Surgeon: Scot Jun, MD;  Location: [facility] ENDOSCOPY;  Service: Endoscopy;  Laterality: N/A;   ESOPHAGOGASTRODUODENOSCOPY (EGD) WITH PROPOFOL N/A 03/16/2022   Procedure: ESOPHAGOGASTRODUODENOSCOPY (EGD) WITH PROPOFOL foregin body removal;  Surgeon: Midge Minium, MD;  Location: [facility];  Service: Endoscopy;  Laterality: N/A;  Diabetic   GASTROJEJUNOSTOMY N/A 08/25/2021   Procedure: LAPAROSCOPIC REDO OF STOMACH TO INTESTINE;  Surgeon: Sheliah Hatch, De Blanch, MD;  Location: [facility];  Service: General;  Laterality: N/A;   KNEE ARTHROSCOPY Left    PARTIAL GASTRECTOMY N/A 08/25/2021   Procedure: PARTIAL GASTRECTOMY;  Surgeon: Sheliah Hatch De Blanch, MD;  Location: [facility];  Service: General;  Laterality: N/A;   POLYPECTOMY  08/30/2020   Procedure: POLYPECTOMY;  Surgeon: Midge Minium, MD;  Location: [facility];  Service: Endoscopy;;   REDUCTION MAMMAPLASTY Bilateral 1988   TONSILLECTOMY     UPPER GI ENDOSCOPY  08/25/2021   Procedure: UPPER GI ENDOSCOPY;  Surgeon: Rodman Pickle, MD;  Location: [facility];  Service: General;;     MEDICATIONS:   Medications Prior to Admission  Medication Sig Dispense Refill Last Dose   alosetron (LOTRONEX) 1 MG tablet Take 1 tablet (1 mg total) by mouth 2 (two) times daily. 60 tablet 5  02/15/2023 at 0400   desloratadine (CLARINEX) 5 MG tablet Take 1 tablet (5 mg total) by mouth daily. 90 tablet 1 02/15/2023 at 0400   DULoxetine (CYMBALTA) 20 MG capsule Take 1 capsule (20 mg total) by mouth daily. (Patient taking differently: Take 20 mg by mouth every evening.) 90 capsule 1 02/14/2023   escitalopram (LEXAPRO) 10 MG tablet Take 1 tablet (10 mg total) by mouth daily. (Patient taking differently: Take 10 mg by mouth every evening.) 90 tablet 1 02/14/2023   furosemide (LASIX) 20 MG tablet Take 1 tablet (20 mg total) by mouth daily. 90 tablet 1 02/14/2023   HYDROcodone-acetaminophen (NORCO/VICODIN) 5-325 MG tablet Take 1 tablet by mouth every 4 (four) hours as needed for moderate pain. 10 tablet 0 Past Week   methocarbamol (ROBAXIN) 750 MG tablet Take 1 tablet (750 mg total) by mouth every 8 (eight) hours as needed for muscle spasms. (Patient taking differently: Take 750 mg by mouth See admin instructions. Take 1 tablet (750 mg) by mouth scheduled every night, may take up to 2 additional dose every 8 hours if needed for spasms.) 90 tablet 1 02/14/2023   metoprolol succinate (TOPROL-XL) 50 MG 24 hr tablet TAKE 1 TABLET BY MOUTH ONCE DAILY (Patient taking differently: Take 50 mg by mouth every evening.) 90 tablet 1 02/14/2023 at  2015   mirtazapine (REMERON) 15 MG tablet Take 1 tablet (15 mg total) by mouth at bedtime. 90 tablet 1 02/14/2023   montelukast (SINGULAIR) 10 MG tablet TAKE 1 TABLET BY MOUTH AT BEDTIME 90 tablet 0 02/14/2023   pantoprazole (PROTONIX) 40 MG tablet TAKE 1 TABLET BY MOUTH ONCE DAILY (Patient taking differently: Take 40 mg by mouth at bedtime. TAKE 1 TABLET BY MOUTH ONCE DAILY) 90 tablet 0 02/14/2023   pregabalin (LYRICA) 50 MG capsule Take 1 capsule (50 mg total) by mouth 2 (two) times daily. 60 capsule 2 02/15/2023 at 0400   rOPINIRole (REQUIP) 2 MG tablet TAKE 1 TABLET BY MOUTH AT BEDTIME 90 tablet 0 02/14/2023   rosuvastatin (CRESTOR) 10 MG tablet Take 1 tablet (10 mg total) by  mouth daily. (Patient taking differently: Take 10 mg by mouth at bedtime.) 90 tablet 1 02/14/2023   Specialty Vitamins Products (HAIR BOOSTER PO) Take 2 capsules by mouth daily. Moerie Ultimate Hair Boost Supplement (hyaluronic acid/vitamin b7/vitamin b9/vitamin b12/vitamin b6/zinc/selenium/copper/vitamin e)   02/14/2023   ALPRAZolam (XANAX) 0.5 MG tablet TAKE 1/2 TO 1 TABLET BY MOUTH ONCE DAILYAS NEEDED FOR ACUTE ANXIETY 30 tablet 1 More than a month   Blood Glucose Monitoring Suppl DEVI 1 each by Does not apply route in the morning, at noon, and at bedtime. May substitute to any manufacturer covered by patient's insurance. 1 each 0    cilostazol (PLETAL) 100 MG tablet Take 1 tablet (100 mg total) by mouth 2 (two) times daily. 180 tablet 3 02/01/2023   Glucose Blood (BLOOD GLUCOSE TEST STRIPS) STRP 1 each by In Vitro route daily. May substitute to any manufacturer covered by patient's insurance. 100 strip 1    Lancet Device MISC 1 each by Does not apply route daily. May substitute to any manufacturer covered by patient's insurance. 1 each 0    Lancets Misc. MISC 1 each by Does not apply route daily. May substitute to any manufacturer covered by patient's insurance. 100 each 0    loperamide (IMODIUM) 2 MG capsule Take 4 mg po once. Repeat with 2mg  po after each loose stool. Max dose is 16 mg/day 135 capsule 1 More than a month    ALLERGIES:   Allergies  Allergen Reactions   Flagyl [Metronidazole] Anaphylaxis and Rash   Penicillins Anaphylaxis   Doxycycline Hives    REVIEW OF SYSTEMS:  A comprehensive review of systems was negative except for: Musculoskeletal: positive for arthralgias and bone pain   FAMILY HISTORY:   Family History  Problem Relation Age of Onset   Hypertension Other    Bladder Cancer Mother    High Cholesterol Mother    High blood pressure Mother    Cancer Mother    Heart attack Father    High blood pressure Father    Kidney cancer Neg Hx    Prostate cancer Neg Hx     Breast cancer Neg Hx     SOCIAL HISTORY:   Social History   Tobacco Use   Smoking status: Former    Current packs/day: 0.00    Types: Cigarettes    Start date: 1976    Quit date: 1996    Years since quitting: 28.7   Smokeless tobacco: Never   Tobacco comments:    quit in 1996  Substance Use Topics   Alcohol use: Yes    Alcohol/week: 2.0 standard drinks of alcohol    Types: 1 Glasses of wine, 1 Standard drinks or equivalent per week  Comment: occasional  social     EXAMINATION:  Vital signs in last 24 hours: Temp:  [98.1 F (36.7 C)] 98.1 F (36.7 C) (10/07 0647) Pulse Rate:  [70] 70 (10/07 0647) Resp:  [16] 16 (10/07 0647) BP: (151)/(77) 151/77 (10/07 0647) SpO2:  [96 %] 96 % (10/07 0647) Weight:  [93.4 kg] 93.4 kg (10/07 0545)  BP (!) 151/77   Pulse 70   Temp 98.1 F (36.7 C) (Oral)   Resp 16   Ht 5\' 4"  (1.626 m)   Wt 93.4 kg   SpO2 96%   BMI 35.36 kg/m   General Appearance:    Alert, cooperative, no distress, appears stated age  Head:    Normocephalic, without obvious abnormality, atraumatic  Eyes:    PERRL, conjunctiva/corneas clear, EOM's intact, fundi    benign, both eyes  Ears:    Normal TM's and external ear canals, both ears  Nose:   Nares normal, septum midline, mucosa normal, no drainage    or sinus tenderness  Throat:   Lips, mucosa, and tongue normal; teeth and gums normal  Neck:   Supple, symmetrical, trachea midline, no adenopathy;    thyroid:  no enlargement/tenderness/nodules; no carotid   bruit or JVD  Back:     Symmetric, no curvature, ROM normal, no CVA tenderness  Lungs:     Clear to auscultation bilaterally, respirations unlabored  Chest Wall:    No tenderness or deformity   Heart:    Regular rate and rhythm, S1 and S2 normal, no murmur, rub   or gallop  Breast Exam:    No tenderness, masses, or nipple abnormality  Abdomen:     Soft, non-tender, bowel sounds active all four quadrants,    no masses, no organomegaly  Genitalia:     Normal female without lesion, discharge or tenderness  Rectal:    Normal tone, normal prostate, no masses or tenderness;   guaiac negative stool  Extremities:   Extremities normal, atraumatic, no cyanosis or edema  Pulses:   2+ and symmetric all extremities  Skin:   Skin color, texture, turgor normal, no rashes or lesions  Lymph nodes:   Cervical, supraclavicular, and axillary nodes normal  Neurologic:   CNII-XII intact, normal strength, sensation and reflexes    throughout    Musculoskeletal:  ROM 0-120, Ligaments intact,  Imaging Review Plain radiographs demonstrate severe degenerative joint disease of the left knee. The overall alignment is neutral. The bone quality appears to be good for age and reported activity level.  Assessment/Plan: Primary osteoarthritis, left knee   The patient history, physical examination and imaging studies are consistent with advanced degenerative joint disease of the left knee. The patient has failed conservative treatment.  The clearance notes were reviewed.  After discussion with the patient it was felt that Total Knee Replacement was indicated. The procedure,  risks, and benefits of total knee arthroplasty were presented and reviewed. The risks including but not limited to aseptic loosening, infection, blood clots, vascular injury, stiffness, patella tracking problems complications among others were discussed. The patient acknowledged the explanation, agreed to proceed with the plan.  Preoperative templating of the joint replacement has been completed, documented, and submitted to the Operating Room personnel in order to optimize intra-operative equipment management.    Patient's anticipated LOS is less than 2 midnights, meeting these requirements: - Lives within 1 hour of care - Has a competent adult at home to recover with post-op recover - NO history of  - Chronic  pain requiring opiods  - Diabetes  - Coronary Artery Disease  - Heart failure  -  Heart attack  - Stroke  - DVT/VTE  - Cardiac arrhythmia  - Respiratory Failure/COPD  - Renal failure  - Anemia  - Advanced Liver disease     Guy Sandifer 02/15/2023, 6:54 AM

## 2023-02-15 NOTE — Anesthesia Procedure Notes (Signed)
Anesthesia Regional Block: Adductor canal block   Pre-Anesthetic Checklist: , timeout performed,  Correct Patient, Correct Site, Correct Laterality,  Correct Procedure, Correct Position, site marked,  Risks and benefits discussed,  Surgical consent,  Pre-op evaluation,  At surgeon's request and post-op pain management  Laterality: Left  Prep: Maximum Sterile Barrier Precautions used, chloraprep       Needles:  Injection technique: Single-shot  Needle Type: Echogenic Stimulator Needle     Needle Length: 9cm  Needle Gauge: 22     Additional Needles:   Procedures:,,,, ultrasound used (permanent image in chart),,    Narrative:  Start time: 02/15/2023 7:00 AM End time: 02/15/2023 7:05 AM Injection made incrementally with aspirations every 5 mL.  Performed by: Personally  Anesthesiologist: Lannie Fields, DO  Additional Notes: Monitors applied. No increased pain on injection. No increased resistance to injection. Injection made in 5cc increments. Good needle visualization. Patient tolerated procedure well.

## 2023-02-15 NOTE — Progress Notes (Signed)
Orthopedic Tech Progress Note Patient Details:  Wendy Hubbard 1955-06-17 [id number]  CPM Left Knee CPM Left Knee: On Left Knee Flexion (Degrees): 90 Left Knee Extension (Degrees): 0  Post Interventions Patient Tolerated: Well Ortho Devices Type of Ortho Device: Bone foam zero knee Ortho Device/Splint Interventions: Ordered   Post Interventions Patient Tolerated: Well  Darleen Crocker 02/15/2023, 9:30 AM

## 2023-02-16 ENCOUNTER — Other Ambulatory Visit (HOSPITAL_COMMUNITY): Payer: Self-pay

## 2023-02-16 ENCOUNTER — Encounter (HOSPITAL_COMMUNITY): Payer: Self-pay | Admitting: Orthopedic Surgery

## 2023-02-16 DIAGNOSIS — M1712 Unilateral primary osteoarthritis, left knee: Secondary | ICD-10-CM | POA: Diagnosis not present

## 2023-02-16 MED ORDER — METOPROLOL TARTRATE 50 MG PO TABS
50.0000 mg | ORAL_TABLET | Freq: Once | ORAL | Status: AC
  Administered 2023-02-16: 50 mg via ORAL
  Filled 2023-02-16: qty 1

## 2023-02-16 MED ORDER — OXYCODONE HCL 5 MG PO TABS
5.0000 mg | ORAL_TABLET | Freq: Four times a day (QID) | ORAL | 0 refills | Status: DC | PRN
Start: 2023-02-16 — End: 2023-07-12
  Filled 2023-02-16: qty 40, 5d supply, fill #0

## 2023-02-16 MED ORDER — APIXABAN 2.5 MG PO TABS
2.5000 mg | ORAL_TABLET | Freq: Two times a day (BID) | ORAL | 0 refills | Status: DC
  Filled 2023-02-16: qty 60, 30d supply, fill #0

## 2023-02-16 MED ORDER — METHOCARBAMOL 500 MG PO TABS
1000.0000 mg | ORAL_TABLET | Freq: Four times a day (QID) | ORAL | 0 refills | Status: DC | PRN
  Filled 2023-02-16: qty 120, 15d supply, fill #0

## 2023-02-16 NOTE — Care Management Obs Status (Signed)
MEDICARE OBSERVATION STATUS NOTIFICATION   Patient Details  Name: Wendy Hubbard MRN: [id number] Date of Birth: Dec 18, 1955   Medicare Observation Status Notification Given:  Yes    Ewing Schlein, LCSW 02/16/2023, 9:55 AM

## 2023-02-16 NOTE — Discharge Summary (Addendum)
SPORTS MEDICINE & JOINT REPLACEMENT   Wendy Spurling, MD   Wendy Nancy, PA-C [address] [place], [place], [place]  [address]                             [phone]  PATIENT ID: Wendy Hubbard        MRN:  [id number]          DOB/AGE: 1956-05-04 / 67 y.o.    DISCHARGE SUMMARY  ADMISSION DATE:    02/15/2023 DISCHARGE DATE:   02/17/2023  ADMISSION DIAGNOSIS: S/P total knee replacement [Z96.659]    DISCHARGE DIAGNOSIS:  Osteoarthritis left knee M17.12    ADDITIONAL DIAGNOSIS: Principal Problem:   S/P total knee replacement  Past Medical History:  Diagnosis Date   Allergy    [employer]reesia 07/23/2020   Anxiety    [employer] 07/23/2020   COPD (chronic obstructive pulmonary disease) (HCC)    DDD (degenerative disc disease), thoracic    Depression    [employer]reesia 07/23/2020   Diabetes mellitus without complication (HCC)    type 2   GERD (gastroesophageal reflux disease)    Hyperlipidemia    Nausea and vomiting 03/04/2022   Neuromuscular disorder (HCC)    severe neuropathy feet   Pneumonia    Rapid heart rate    Sleep apnea    no longer uses CPAP due to weight loss   TMJ (temporomandibular joint disorder) 11/28/2022   Wears contact lenses     PROCEDURE: Procedure(s): TOTAL KNEE ARTHROPLASTY on 02/15/2023  CONSULTS:    HISTORY:  See H&P in chart  HOSPITAL COURSE:  Wendy Hubbard is a 67 y.o. admitted on 02/15/2023 and found to have a diagnosis of Osteoarthritis left knee M17.12.  After appropriate laboratory studies were obtained  they were taken to the operating room on 02/15/2023 and underwent Procedure(s): TOTAL KNEE ARTHROPLASTY.   They were given perioperative antibiotics:  Anti-infectives (From admission, onward)    Start     Dose/Rate Route Frequency Ordered Stop   02/15/23 0721  vancomycin (VANCOCIN) 1-5 GM/200ML-% IVPB       Note to Pharmacy: Wendy Hubbard : cabinet override      02/15/23 0721 02/15/23 1929     .  Patient given tranexamic acid IV or topical and  exparel intra-operatively.  Tolerated the procedure well.    POD# 1: Vital signs were stable.  Patient denied Chest pain, shortness of breath, or calf pain.  Patient was started on Aspirin twice daily at 8am.  Consults to PT, OT, and care management were made.  The patient was weight bearing as tolerated.  CPM was placed on the operative leg 0-90 degrees for 6-8 hours a day. When out of the CPM, patient was placed in the foam block to achieve full extension. Incentive spirometry was taught.  Dressing was changed.       POD #2, Continued  PT for ambulation and exercise program.  IV saline locked.  O2 discontinued.    The remainder of the hospital course was dedicated to ambulation and strengthening.   The patient was discharged on 1 Day Post-Op in  Good condition.  Blood products given:none  DIAGNOSTIC STUDIES: Recent vital signs: Patient Vitals for the past 24 hrs:  BP Temp Temp src Pulse Resp SpO2  02/16/23 0821 (!) 153/63 -- -- 96 17 97 %  02/16/23 0607 (!) 156/70 98.1 F (36.7 C) -- 72 16 99 %  02/16/23 0131 (!) 145/64  97.9 F (36.6 C) -- 70 15 99 %  02/15/23 2121 136/70 97.7 F (36.5 C) -- 78 15 97 %  02/15/23 1748 128/71 98.3 F (36.8 C) Oral 96 18 98 %       Recent laboratory studies: No results for input(s): "WBC", "HGB", "HCT", "PLT" in the last 168 hours. No results for input(s): "NA", "K", "CL", "CO2", "BUN", "CREATININE", "GLUCOSE", "CALCIUM" in the last 168 hours. No results found for: "INR", "PROTIME"   Recent Radiographic Studies :  DG Pelvis 1-2 Views  Result Date: 02/16/2023 CLINICAL DATA:  Hip pain for a month. EXAM: PELVIS - 1 VIEW COMPARISON:  None Available. FINDINGS: There is bilateral hip degenerative change with joint space narrowing and small osteophytes. Pelvic ring is intact. No acute fracture, dislocation or subluxation. No osteolytic or osteoblastic lesions. IMPRESSION: Bilateral hip degenerative changes.  No acute osseous abnormalities. Electronically  Signed   By: Layla Maw M.D.   On: 02/16/2023 08:33    DISCHARGE INSTRUCTIONS: Discharge Instructions     Call MD / Call 911   Complete by: As directed    If you experience chest pain or shortness of breath, CALL 911 and be transported to the hospital emergency room.  If you develope a fever above 101 F, pus (white drainage) or increased drainage or redness at the wound, or calf pain, call your surgeon's office.   Constipation Prevention   Complete by: As directed    Drink plenty of fluids.  Prune juice may be helpful.  You may use a stool softener, such as Colace (over the counter) 100 mg twice a day.  Use MiraLax (over the counter) for constipation as needed.   Diet - low sodium heart healthy   Complete by: As directed    Discharge instructions   Complete by: As directed    INSTRUCTIONS AFTER JOINT REPLACEMENT   Remove items at home which could result in a fall. This includes throw rugs or furniture in walking pathways ICE to the affected joint every three hours while awake for 30 minutes at a time, for at least the first 3-5 days, and then as needed for pain and swelling.  Continue to use ice for pain and swelling. You may notice swelling that will progress down to the foot and ankle.  This is normal after surgery.  Elevate your leg when you are not up walking on it.   Continue to use the breathing machine you got in the hospital (incentive spirometer) which will help keep your temperature down.  It is common for your temperature to cycle up and down following surgery, especially at night when you are not up moving around and exerting yourself.  The breathing machine keeps your lungs expanded and your temperature down.   DIET:  As you were doing prior to hospitalization, we recommend a well-balanced diet.  DRESSING / WOUND CARE / SHOWERING  Keep the surgical dressing until follow up.  The dressing is water proof, so you can shower without any extra covering.  IF THE DRESSING FALLS  OFF or the wound gets wet inside, change the dressing with sterile gauze.  Please use good hand washing techniques before changing the dressing.  Do not use any lotions or creams on the incision until instructed by your surgeon.    ACTIVITY  Increase activity slowly as tolerated, but follow the weight bearing instructions below.   No driving for 6 weeks or until further direction given by your physician.  You cannot drive  while taking narcotics.  No lifting or carrying greater than 10 lbs. until further directed by your surgeon. Avoid periods of inactivity such as sitting longer than an hour when not asleep. This helps prevent blood clots.  You may return to work once you are authorized by your doctor.     WEIGHT BEARING   Weight bearing as tolerated with assist device (walker, cane, etc) as directed, use it as Fletes as suggested by your surgeon or therapist, typically at least 4-6 weeks.   EXERCISES  Results after joint replacement surgery are often greatly improved when you follow the exercise, range of motion and muscle strengthening exercises prescribed by your doctor. Safety measures are also important to protect the joint from further injury. Any time any of these exercises cause you to have increased pain or swelling, decrease what you are doing until you are comfortable again and then slowly increase them. If you have problems or questions, call your caregiver or physical therapist for advice.   Rehabilitation is important following a joint replacement. After just a few days of immobilization, the muscles of the leg can become weakened and shrink (atrophy).  These exercises are designed to build up the tone and strength of the thigh and leg muscles and to improve motion. Often times heat used for twenty to thirty minutes before working out will loosen up your tissues and help with improving the range of motion but do not use heat for the first two weeks following surgery (sometimes heat  can increase post-operative swelling).   These exercises can be done on a training (exercise) mat, on the floor, on a table or on a bed. Use whatever works the best and is most comfortable for you.    Use music or television while you are exercising so that the exercises are a pleasant break in your day. This will make your life better with the exercises acting as a break in your routine that you can look forward to.   Perform all exercises about fifteen times, three times per day or as directed.  You should exercise both the operative leg and the other leg as well.  Exercises include:   Quad Sets - Tighten up the muscle on the front of the thigh (Quad) and hold for 5-10 seconds.   Straight Leg Raises - With your knee straight (if you were given a brace, keep it on), lift the leg to 60 degrees, hold for 3 seconds, and slowly lower the leg.  Perform this exercise against resistance later as your leg gets stronger.  Leg Slides: Lying on your back, slowly slide your foot toward your buttocks, bending your knee up off the floor (only go as far as is comfortable). Then slowly slide your foot back down until your leg is flat on the floor again.  Angel Wings: Lying on your back spread your legs to the side as far apart as you can without causing discomfort.  Hamstring Strength:  Lying on your back, push your heel against the floor with your leg straight by tightening up the muscles of your buttocks.  Repeat, but this time bend your knee to a comfortable angle, and push your heel against the floor.  You may put a pillow under the heel to make it more comfortable if necessary.   A rehabilitation program following joint replacement surgery can speed recovery and prevent re-injury in the future due to weakened muscles. Contact your doctor or a physical therapist for more information on knee  rehabilitation.    CONSTIPATION  Constipation is defined medically as fewer than three stools per week and severe  constipation as less than one stool per week.  Even if you have a regular bowel pattern at home, your normal regimen is likely to be disrupted due to multiple reasons following surgery.  Combination of anesthesia, postoperative narcotics, change in appetite and fluid intake all can affect your bowels.   YOU MUST use at least one of the following options; they are listed in order of increasing strength to get the job done.  They are all available over the counter, and you may need to use some, POSSIBLY even all of these options:    Drink plenty of fluids (prune juice may be helpful) and high fiber foods Colace 100 mg by mouth twice a day  Senokot for constipation as directed and as needed Dulcolax (bisacodyl), take with full glass of water  Miralax (polyethylene glycol) once or twice a day as needed.  If you have tried all these things and are unable to have a bowel movement in the first 3-4 days after surgery call either your surgeon or your primary doctor.    If you experience loose stools or diarrhea, hold the medications until you stool forms back up.  If your symptoms do not get better within 1 week or if they get worse, check with your doctor.  If you experience "the worst abdominal pain ever" or develop nausea or vomiting, please contact the office immediately for further recommendations for treatment.   ITCHING:  If you experience itching with your medications, try taking only a single pain pill, or even half a pain pill at a time.  You can also use Benadryl over the counter for itching or also to help with sleep.   TED HOSE STOCKINGS:  Use stockings on both legs until for at least 2 weeks or as directed by physician office. They may be removed at night for sleeping.  MEDICATIONS:  See your medication summary on the "After Visit Summary" that nursing will review with you.  You may have some home medications which will be placed on hold until you complete the course of blood thinner  medication.  It is important for you to complete the blood thinner medication as prescribed.  PRECAUTIONS:  If you experience chest pain or shortness of breath - call 911 immediately for transfer to the hospital emergency department.   If you develop a fever greater that 101 F, purulent drainage from wound, increased redness or drainage from wound, foul odor from the wound/dressing, or calf pain - CONTACT YOUR SURGEON.                                                   FOLLOW-UP APPOINTMENTS:  If you do not already have a post-op appointment, please call the office for an appointment to be seen by your surgeon.  Guidelines for how soon to be seen are listed in your "After Visit Summary", but are typically between 1-4 weeks after surgery.  OTHER INSTRUCTIONS:   Knee Replacement:  Do not place pillow under knee, focus on keeping the knee straight while resting. CPM instructions: 0-90 degrees, 2 hours in the morning, 2 hours in the afternoon, and 2 hours in the evening. Place foam block, curve side up under heel at all times except  when in CPM or when walking.  DO NOT modify, tear, cut, or change the foam block in any way.  POST-OPERATIVE OPIOID TAPER INSTRUCTIONS: It is important to wean off of your opioid medication as soon as possible. If you do not need pain medication after your surgery it is ok to stop day one. Opioids include: Codeine, Hydrocodone(Norco, Vicodin), Oxycodone(Percocet, oxycontin) and hydromorphone amongst others.  Neumeister term and even short term use of opiods can cause: Increased pain response Dependence Constipation Depression Respiratory depression And more.  Withdrawal symptoms can include Flu like symptoms Nausea, vomiting And more Techniques to manage these symptoms Hydrate well Eat regular healthy meals Stay active Use relaxation techniques(deep breathing, meditating, yoga) Do Not substitute Alcohol to help with tapering If you have been on opioids for less than  two weeks and do not have pain than it is ok to stop all together.  Plan to wean off of opioids This plan should start within one week post op of your joint replacement. Maintain the same interval or time between taking each dose and first decrease the dose.  Cut the total daily intake of opioids by one tablet each day Next start to increase the time between doses. The last dose that should be eliminated is the evening dose.     MAKE SURE YOU:  Understand these instructions.  Get help right away if you are not doing well or get worse.    Thank you for letting us be a part of your medical care team.  It is a privilege we respect greatly.  We hope these instructions will help you stay on track for a fast and full recovery!   Increase activity slowly as tolerated   Complete by: As directed    Post-operative opioid taper instructions:   Complete by: As directed    POST-OPERATIVE OPIOID TAPER INSTRUCTIONS: It is important to wean off of your opioid medication as soon as possible. If you do not need pain medication after your surgery it is ok to stop day one. Opioids include: Codeine, Hydrocodone(Norco, Vicodin), Oxycodone(Percocet, oxycontin) and hydromorphone amongst others.  Librizzi term and even short term use of opiods can cause: Increased pain response Dependence Constipation Depression Respiratory depression And more.  Withdrawal symptoms can include Flu like symptoms Nausea, vomiting And more Techniques to manage these symptoms Hydrate well Eat regular healthy meals Stay active Use relaxation techniques(deep breathing, meditating, yoga) Do Not substitute Alcohol to help with tapering If you have been on opioids for less than two weeks and do not have pain than it is ok to stop all together.  Plan to wean off of opioids This plan should start within one week post op of your joint replacement. Maintain the same interval or time between taking each dose and first decrease the  dose.  Cut the total daily intake of opioids by one tablet each day Next start to increase the time between doses. The last dose that should be eliminated is the evening dose.          DISCHARGE MEDICATIONS:   Allergies as of 02/16/2023       Reactions   Flagyl [metronidazole] Anaphylaxis, Rash   Penicillins Anaphylaxis   Doxycycline Hives        Medication List     STOP taking these medications    HYDROcodone-acetaminophen 5-325 MG tablet Commonly known as: NORCO/VICODIN       TAKE these medications    alosetron 1 MG tablet Commonly known as: LOTRONEX  Take 1 tablet (1 mg total) by mouth 2 (two) times daily.   ALPRAZolam 0.5 MG tablet Commonly known as: XANAX TAKE 1/2 TO 1 TABLET BY MOUTH ONCE DAILYAS NEEDED FOR ACUTE ANXIETY   apixaban 2.5 MG Tabs tablet Commonly known as: ELIQUIS Take 1 tablet (2.5 mg total) by mouth every 12 (twelve) hours.   Blood Glucose Monitoring Suppl Devi 1 each by Does not apply route in the morning, at noon, and at bedtime. May substitute to any manufacturer covered by patient's insurance.   BLOOD GLUCOSE TEST STRIPS Strp 1 each by In Vitro route daily. May substitute to any manufacturer covered by patient's insurance.   cilostazol 100 MG tablet Commonly known as: PLETAL Take 1 tablet (100 mg total) by mouth 2 (two) times daily.   desloratadine 5 MG tablet Commonly known as: Clarinex Take 1 tablet (5 mg total) by mouth daily.   DULoxetine 20 MG capsule Commonly known as: CYMBALTA Take 1 capsule (20 mg total) by mouth daily. What changed: when to take this   escitalopram 10 MG tablet Commonly known as: LEXAPRO Take 1 tablet (10 mg total) by mouth daily. What changed: when to take this   furosemide 20 MG tablet Commonly known as: LASIX Take 1 tablet (20 mg total) by mouth daily.   HAIR BOOSTER PO Take 2 capsules by mouth daily. Moerie Ultimate Hair Boost Supplement (hyaluronic acid/vitamin b7/vitamin b9/vitamin  b12/vitamin b6/zinc/selenium/copper/vitamin e)   Lancet Device Misc 1 each by Does not apply route daily. May substitute to any manufacturer covered by patient's insurance.   Lancets Misc. Misc 1 each by Does not apply route daily. May substitute to any manufacturer covered by patient's insurance.   loperamide 2 MG capsule Commonly known as: IMODIUM Take 4 mg po once. Repeat with 2mg  po after each loose stool. Max dose is 16 mg/day   Methocarbamol 1000 MG Tabs Take 1,000 mg by mouth every 6 (six) hours as needed for muscle spasms. What changed:  medication strength how much to take when to take this   metoprolol succinate 50 MG 24 hr tablet Commonly known as: TOPROL-XL TAKE 1 TABLET BY MOUTH ONCE DAILY What changed: when to take this   mirtazapine 15 MG tablet Commonly known as: REMERON Take 1 tablet (15 mg total) by mouth at bedtime.   montelukast 10 MG tablet Commonly known as: SINGULAIR TAKE 1 TABLET BY MOUTH AT BEDTIME   oxyCODONE 5 MG immediate release tablet Commonly known as: Oxy IR/ROXICODONE Take 1-2 tablets (5-10 mg total) by mouth every 6 (six) hours as needed for moderate pain (pain score 4-6).   pantoprazole 40 MG tablet Commonly known as: PROTONIX TAKE 1 TABLET BY MOUTH ONCE DAILY What changed:  when to take this additional instructions   pregabalin 50 MG capsule Commonly known as: LYRICA Take 1 capsule (50 mg total) by mouth 2 (two) times daily.   rOPINIRole 2 MG tablet Commonly known as: REQUIP TAKE 1 TABLET BY MOUTH AT BEDTIME   rosuvastatin 10 MG tablet Commonly known as: CRESTOR Take 1 tablet (10 mg total) by mouth daily. What changed: when to take this               Durable Medical Equipment  (From admission, onward)           Start     Ordered   02/15/23 1200  DME Walker rolling  Once       Question:  Patient needs a walker to treat with the  following condition  Answer:  S/P total knee replacement   02/15/23 1159    02/15/23 1200  DME 3 n 1  Once        02/15/23 1159   02/15/23 1200  DME Bedside commode  Once       Question:  Patient needs a bedside commode to treat with the following condition  Answer:  S/P total knee replacement   02/15/23 1159            FOLLOW UP VISIT:    DISPOSITION: HOME VS. SNF  Dental Antibiotics:  In most cases prophylactic antibiotics for Dental procdeures after total joint surgery are not necessary.  Exceptions are as follows:  1. History of prior total joint infection  2. Severely immunocompromised (Organ Transplant, cancer chemotherapy, Rheumatoid biologic meds such as Humera)  3. Poorly controlled diabetes (A1C &gt; 8.0, blood glucose over 200)  If you have one of these conditions, contact your surgeon for an antibiotic prescription, prior to your dental procedure.   CONDITION:  Good   Guy Sandifer 02/16/2023, 1:16 PM

## 2023-02-16 NOTE — Progress Notes (Addendum)
SPORTS MEDICINE AND JOINT REPLACEMENT  Georgena Spurling, MD    Laurier Nancy, PA-C [address] [place], [place], [place]  [address]                             [phone]   PROGRESS NOTE  Subjective:  negative for Chest Pain  negative for Shortness of Breath  negative for Nausea/Vomiting   negative for Calf Pain  negative for Bowel Movement   Tolerating Diet: yes         Patient reports pain as 6 on 0-10 scale.    Objective: Vital signs in last 24 hours:   Patient Vitals for the past 24 hrs:  BP Temp Temp src Pulse Resp SpO2  02/16/23 0607 (!) 156/70 98.1 F (36.7 C) -- 72 16 99 %  02/16/23 0131 (!) 145/64 97.9 F (36.6 C) -- 70 15 99 %  02/15/23 2121 136/70 97.7 F (36.5 C) -- 78 15 97 %  02/15/23 1748 128/71 98.3 F (36.8 C) Oral 96 18 98 %  02/15/23 1312 (!) 147/71 (!) 97.5 F (36.4 C) Oral 83 18 98 %  02/15/23 1200 (!) 157/80 (!) 97.5 F (36.4 C) Oral 82 17 100 %  02/15/23 1145 (!) 161/76 -- -- 83 15 96 %  02/15/23 1130 (!) 158/82 -- -- 78 16 97 %  02/15/23 1115 (!) 150/68 -- -- 76 19 100 %  02/15/23 1100 (!) 112/98 -- -- 84 15 96 %  02/15/23 1045 (!) 144/69 -- -- 85 18 97 %  02/15/23 1030 (!) 151/69 -- -- 81 12 98 %  02/15/23 1015 (!) 148/71 98 F (36.7 C) -- 82 14 97 %  02/15/23 1000 (!) 143/62 -- -- 77 10 96 %  02/15/23 0945 134/77 -- -- 79 11 94 %  02/15/23 0930 126/68 -- -- 83 13 95 %  02/15/23 0915 (!) 145/62 -- -- 81 11 96 %  02/15/23 0900 138/71 97.6 F (36.4 C) -- 85 14 100 %  02/15/23 0852 (!) 149/70 -- -- 84 18 98 %    @flow {1959:LAST@   Intake/Output from previous day:   10/07 0701 - 10/08 0700 In: 3457.6 [P.O.:750; I.V.:2302.6] Out: 4290 [Urine:4275]   Intake/Output this shift:   No intake/output data recorded.   Intake/Output      10/07 0701 10/08 0700 10/08 0701 10/09 0700   P.O. 750    I.V. (mL/kg) 2302.6 (24.7)    IV Piggyback 405    Total Intake(mL/kg) 3457.6 (37)    Urine (mL/kg/hr) 4275 (1.9)    Blood 15    Total Output  4290    Net -832.4            LABORATORY DATA: No results for input(s): "WBC", "HGB", "HCT", "PLT" in the last 168 hours. No results for input(s): "NA", "K", "CL", "CO2", "BUN", "CREATININE", "GLUCOSE", "CALCIUM" in the last 168 hours. No results found for: "INR", "PROTIME"  Examination:  General appearance: alert, cooperative, and no distress Extremities: extremities normal, atraumatic, no cyanosis or edema  Wound Exam: clean, dry, intact   Drainage:  None: wound tissue dry  Motor Exam: Quadriceps and Hamstrings Intact  Sensory Exam: Superficial Peroneal, Deep Peroneal, and Tibial normal   Assessment:    1 Day Post-Op  Procedure(s) (LRB): TOTAL KNEE ARTHROPLASTY (Left)  ADDITIONAL DIAGNOSIS:  Principal Problem:   S/P total knee replacement     Plan: Physical Therapy as ordered Weight Bearing as  Tolerated (WBAT)  DVT Prophylaxis:  Eliquis  DISCHARGE PLAN: Home   Will continue to monitor pain control and PT to determine D/C today vs tomorrow      Patient's anticipated LOS is less than 2 midnights, meeting these requirements: - Lives within 1 hour of care - Has a competent adult at home to recover with post-op recover - NO history of  - Chronic pain requiring opiods  - Diabetes  - Coronary Artery Disease  - Heart failure  - Heart attack  - Stroke  - DVT/VTE  - Cardiac arrhythmia  - Respiratory Failure/COPD  - Renal failure  - Anemia  - Advanced Liver disease      Guy Sandifer 02/16/2023, 7:57 AM

## 2023-02-16 NOTE — Progress Notes (Signed)
Physical Therapy Treatment Patient Details Name: Wendy Hubbard MRN: [id number] DOB: 1955/12/05 Today's Date: 02/16/2023   History of Present Illness 67 y.o. female admitted 02/15/23 for L TKA. PMH: back pain, DM, partial gastrectomy    PT Comments  POD # 1 pm session Assisted OOB to amb an increased distance of 57 feet.  Tolerated well with mild c/o fatigue and 8/10 pain.  Then returned to room to perform some TE's following HEP handout.  Instructed on proper tech, freq as well as use of ICE.   Pt has met all mobility goals to D/C to home today if Ortho Team agrees.  Daughter feels "comfortable" if she does leave today.    If plan is discharge home, recommend the following: A little help with walking and/or transfers;A little help with bathing/dressing/bathroom;Assistance with cooking/housework;Assist for transportation;Help with stairs or ramp for entrance   Can travel by private vehicle        Equipment Recommendations  None recommended by PT    Recommendations for Other Services       Precautions / Restrictions Precautions Precautions: Knee;Fall Precaution Comments: reviewed no pillow under L knee Restrictions Weight Bearing Restrictions: No LLE Weight Bearing: Weight bearing as tolerated     Mobility  Bed Mobility Overal bed mobility: Modified Independent             General bed mobility comments: self able using belt    Transfers Overall transfer level: Needs assistance Equipment used: Rolling walker (2 wheels) Transfers: Sit to/from Stand Sit to Stand: Supervision           General transfer comment: VCs hand placement and safety with turns    Ambulation/Gait Ambulation/Gait assistance: Supervision, Contact guard assist Gait Distance (Feet): 57 Feet Assistive device: Rolling walker (2 wheels) Gait Pattern/deviations: Step-to pattern, Decreased step length - right, Decreased step length - left Gait velocity: decreased     General Gait Details: VCs  sequencing and safety with turns.  Tolerated amb a functional distance of 57 feet.  Reported 8/10 pain.   Stairs             Wheelchair Mobility     Tilt Bed    Modified Rankin (Stroke Patients Only)       Balance                                            Cognition Arousal: Alert Behavior During Therapy: WFL for tasks assessed/performed Overall Cognitive Status: Within Functional Limits for tasks assessed                                 General Comments: AxO x 3 pleasant [employer] who lives home alone but a daughter plans to staby with her to assist.        Exercises  Total Knee Replacement TE's following HEP handout 10 reps B LE ankle pumps 05 reps towel squeezes 05 reps knee presses 05 reps heel slides  05 reps SAQ's 05 reps SLR's 05 reps ABD Educated on use of gait belt to assist with TE's Followed by ICE     General Comments        Pertinent Vitals/Pain Pain Assessment Pain Assessment: 0-10 Pain Score: 8  Pain Location: L knee with walking Pain Descriptors / Indicators: Aching, Grimacing, Operative site  guarding Pain Intervention(s): Monitored during session, Premedicated before session, Repositioned, Ice applied    Home Living                          Prior Function            PT Goals (current goals can now be found in the care plan section) Progress towards PT goals: Progressing toward goals    Frequency    7X/week      PT Plan      Co-evaluation              AM-PAC PT "6 Clicks" Mobility   Outcome Measure  Help needed turning from your back to your side while in a flat bed without using bedrails?: None Help needed moving from lying on your back to sitting on the side of a flat bed without using bedrails?: None Help needed moving to and from a bed to a chair (including a wheelchair)?: None Help needed standing up from a chair using your arms (e.g., wheelchair or  bedside chair)?: None Help needed to walk in hospital room?: A Little Help needed climbing 3-5 steps with a railing? : A Little 6 Click Score: 22    End of Session Equipment Utilized During Treatment: Gait belt Activity Tolerance: Patient tolerated treatment well Patient left: with call bell/phone within reach;with family/visitor present;in bed Nurse Communication: Mobility status PT Visit Diagnosis: Difficulty in walking, not elsewhere classified (R26.2);Pain Pain - Right/Left: Left Pain - part of body: Knee     Time: 1140-1207 PT Time Calculation (min) (ACUTE ONLY): 27 min  Charges:    $Gait Training: 8-22 mins $Therapeutic Exercise: 8-22 mins $Therapeutic Activity: 8-22 mins PT General Charges $$ ACUTE PT VISIT: 1 Visit                     Felecia Shelling  PTA Acute  Rehabilitation Services Office M-F          [phone]

## 2023-02-16 NOTE — TOC Transition Note (Signed)
Transition of Care [facility]) - CM/SW Discharge Note  Patient Details  Name: Wendy Hubbard MRN: [id number] Date of Birth: 28-Jan-1956  [facility] CM/SW Contact:  Ewing Schlein, LCSW Phone Number: 02/16/2023, 12:43 PM  Clinical Narrative: Patient is expected to discharge home after working with PT. CSW met with patient and daughter to confirm discharge plan and needs. Patient will go home with OPPT. Patient was set up with a rolling walker and CPM through [employer]. Patient declined to private pay for a 3N1, so daughter will purchase a 3N1 and ice machine online. TOC signing off.  Final next level of care: OP Rehab Barriers to Discharge: No Barriers Identified  Patient Goals and CMS Choice Choice offered to / list presented to : NA  Discharge Plan and Services Additional resources added to the After Visit Summary for            DME Arranged: N/A DME Agency: NA  Social Determinants of Health (SDOH) Interventions SDOH Screenings   Food Insecurity: No Food Insecurity (02/15/2023)  Housing: Low Risk  (02/15/2023)  Transportation Needs: No Transportation Needs (02/15/2023)  Utilities: Not At Risk (02/15/2023)  Alcohol Screen: Low Risk  (02/02/2023)  Depression (PHQ2-9): Low Risk  (02/02/2023)  Recent Concern: Depression (PHQ2-9) - Medium Risk (01/26/2023)  Financial Resource Strain: Low Risk  (02/02/2023)  Physical Activity: Inactive (02/02/2023)  Social Connections: Socially Integrated (02/02/2023)  Stress: No Stress Concern Present (02/02/2023)  Tobacco Use: Medium Risk (02/15/2023)  Health Literacy: Adequate Health Literacy (02/02/2023)   Readmission Risk Interventions     No data to display

## 2023-02-16 NOTE — Progress Notes (Signed)
Pt is concerned that her BP is too high (BP-174/78) and that she could be at risk for stroke given that she is diabetic. [facility]-PA made aware, see new order.   Pt is also concerned that her CBG is not being checked while staying at the hospital. [facility]-PA made aware.

## 2023-02-16 NOTE — Progress Notes (Signed)
Physical Therapy Treatment Patient Details Name: Wendy Hubbard MRN: [id number] DOB: [birth year] Today's Date: 02/16/2023   History of Present Illness 67 y.o. female admitted 02/15/23 for L TKA. PMH: back pain, DM, partial gastrectomy    PT Comments  POD # 1 am session Pt AxO x 3 pleasant and slightly nervous.  "I don't do pain", stated pt.  Assisted OOB to amb 10 feet to portable stairs.  Practiced up/down 2 steps using B hands on one rail.  Assisted back to bed per pt request due to increased pain.  Applied ICE.  Daughter present during session and observed.  Will see pt again to Educate on HEP and increase her amb distance.     If plan is discharge home, recommend the following: A little help with walking and/or transfers;A little help with bathing/dressing/bathroom;Assistance with cooking/housework;Assist for transportation;Help with stairs or ramp for entrance   Can travel by private vehicle        Equipment Recommendations  None recommended by PT    Recommendations for Other Services       Precautions / Restrictions Precautions Precautions: Knee;Fall Precaution Comments: reviewed no pillow under L knee Restrictions Weight Bearing Restrictions: No LLE Weight Bearing: Weight bearing as tolerated     Mobility  Bed Mobility Overal bed mobility: Modified Independent             General bed mobility comments: self able using belt    Transfers Overall transfer level: Needs assistance Equipment used: Rolling walker (2 wheels) Transfers: Sit to/from Stand Sit to Stand: Supervision           General transfer comment: VCs hand placement and safety with turns    Ambulation/Gait Ambulation/Gait assistance: Supervision, Contact guard assist Gait Distance (Feet): 10 Feet Assistive device: Rolling walker (2 wheels) Gait Pattern/deviations: Step-to pattern, Decreased step length - right, Decreased step length - left Gait velocity: decreased     General Gait Details:  VCs sequencing and safety with turns.  Amb from bed to portable stairs 10 feet and back.   Stairs             Wheelchair Mobility     Tilt Bed    Modified Rankin (Stroke Patients Only)       Balance                                            Cognition Arousal: Alert Behavior During Therapy: WFL for tasks assessed/performed Overall Cognitive Status: Within Functional Limits for tasks assessed                                 General Comments: AxO x 3 pleasant [employer] who lives home alone but a daughter plans to staby with her to assist.        Exercises      General Comments        Pertinent Vitals/Pain Pain Assessment Pain Assessment: 0-10 Pain Score: 8  Pain Location: L knee with walking Pain Descriptors / Indicators: Aching, Grimacing, Operative site guarding Pain Intervention(s): Monitored during session, Premedicated before session, Repositioned, Ice applied    Home Living                          Prior Function  PT Goals (current goals can now be found in the care plan section) Progress towards PT goals: Progressing toward goals    Frequency    7X/week      PT Plan      Co-evaluation              AM-PAC PT "6 Clicks" Mobility   Outcome Measure  Help needed turning from your back to your side while in a flat bed without using bedrails?: None Help needed moving from lying on your back to sitting on the side of a flat bed without using bedrails?: None Help needed moving to and from a bed to a chair (including a wheelchair)?: None Help needed standing up from a chair using your arms (e.g., wheelchair or bedside chair)?: None Help needed to walk in hospital room?: A Little Help needed climbing 3-5 steps with a railing? : A Little 6 Click Score: 22    End of Session Equipment Utilized During Treatment: Gait belt Activity Tolerance: Patient tolerated treatment  well Patient left: with call bell/phone within reach;with family/visitor present;in bed Nurse Communication: Mobility status PT Visit Diagnosis: Difficulty in walking, not elsewhere classified (R26.2);Pain Pain - Right/Left: Left Pain - part of body: Knee     Time: 4098-1191 PT Time Calculation (min) (ACUTE ONLY): 25 min  Charges:    $Gait Training: 8-22 mins $Therapeutic Activity: 8-22 mins PT General Charges $$ ACUTE PT VISIT: 1 Visit                     Felecia Shelling  PTA Acute  Rehabilitation Services Office M-F          [phone]

## 2023-02-16 NOTE — Plan of Care (Signed)

## 2023-02-17 ENCOUNTER — Encounter: Payer: Self-pay | Admitting: Family Medicine

## 2023-02-17 DIAGNOSIS — M1712 Unilateral primary osteoarthritis, left knee: Secondary | ICD-10-CM | POA: Diagnosis not present

## 2023-02-17 NOTE — Progress Notes (Addendum)
Patient took CBG with home meter and CBG 253. Patient c/o of no monitoring of blood glucose/medication and later requested ice cream, peanut butter, saltine and graham crackers from NT/NS. Patient advised that she should not eat all those carbs and ice cream if she is currently worried about her glucose level. Patient acknowledged, but reported she ate the ice cream already.

## 2023-02-17 NOTE — Progress Notes (Signed)
Orthopedic Tech Progress Note Patient Details:  Wendy Hubbard [birth year] [id number]  CPM Left Knee CPM Left Knee: Off Left Knee Flexion (Degrees): 45 Left Knee Extension (Degrees): 0  Post Interventions Patient Tolerated: Lidia Collum 02/17/2023, 12:54 PM

## 2023-02-17 NOTE — Progress Notes (Signed)
SPORTS MEDICINE AND JOINT REPLACEMENT  Georgena Spurling, MD    Laurier Nancy, PA-C [address] [place], [place], [place]  [address]                             [phone]   PROGRESS NOTE  Subjective:  negative for Chest Pain  negative for Shortness of Breath  negative for Nausea/Vomiting   negative for Calf Pain  negative for Bowel Movement   Tolerating Diet: yes         Patient reports pain as 6 on 0-10 scale.    Objective: Vital signs in last 24 hours:   Patient Vitals for the past 24 hrs:  BP Temp Temp src Pulse Resp SpO2  02/17/23 0554 (!) 198/68 98.1 F (36.7 C) -- (!) 56 18 92 %  02/17/23 0203 (!) 164/71 98.1 F (36.7 C) -- 73 19 95 %  02/16/23 2153 (!) 170/79 98.2 F (36.8 C) -- 83 18 100 %  02/16/23 1817 (!) 174/78 98 F (36.7 C) Oral (!) 103 20 98 %  02/16/23 1656 (!) 150/70 -- -- 90 -- 95 %  02/16/23 1558 (!) 162/73 97.7 F (36.5 C) Oral 96 20 95 %  02/16/23 1510 (!) 175/74 -- -- 90 -- --  02/16/23 1438 (!) 171/67 -- -- 93 -- --  02/16/23 1401 (!) 171/67 98.3 F (36.8 C) Oral 95 -- 95 %  02/16/23 1345 129/76 97.6 F (36.4 C) Oral 89 18 99 %  02/16/23 0821 (!) 153/63 -- -- 96 17 97 %    @flow {1959:LAST@   Intake/Output from previous day:   10/08 0701 - 10/09 0700 In: 1039.1 [P.O.:840; I.V.:199.1] Out: 950 [Urine:950]   Intake/Output this shift:   No intake/output data recorded.   Intake/Output      10/08 0701 10/09 0700 10/09 0701 10/10 0700   P.O. 840    I.V. (mL/kg) 199.1 (2.1)    IV Piggyback     Total Intake(mL/kg) 1039.1 (11.1)    Urine (mL/kg/hr) 950 (0.4)    Blood     Total Output 950    Net +89.1         Urine Occurrence 7 x    Stool Occurrence 0 x    Emesis Occurrence 0 x       LABORATORY DATA: No results for input(s): "WBC", "HGB", "HCT", "PLT" in the last 168 hours. No results for input(s): "NA", "K", "CL", "CO2", "BUN", "CREATININE", "GLUCOSE", "CALCIUM" in the last 168 hours. No results found for: "INR",  "PROTIME"  Examination:  General appearance: alert, cooperative, and no distress Extremities: extremities normal, atraumatic, no cyanosis or edema  Wound Exam: clean, dry, intact   Drainage:  None: wound tissue dry  Motor Exam: Quadriceps and Hamstrings Intact  Sensory Exam: Superficial Peroneal, Deep Peroneal, and Tibial normal   Assessment:    2 Days Post-Op  Procedure(s) (LRB): TOTAL KNEE ARTHROPLASTY (Left)  ADDITIONAL DIAGNOSIS:  Principal Problem:   S/P total knee replacement     Plan: Physical Therapy as ordered Weight Bearing as Tolerated (WBAT)  DVT Prophylaxis:   eliquis  DISCHARGE PLAN: Home   Pain is better today. Plan on d/c home      Patient's anticipated LOS is less than 2 midnights, meeting these requirements: - Lives within 1 hour of care - Has a competent adult at home to recover with post-op recover - NO history of  - Chronic pain requiring opiods  -  Diabetes  - Coronary Artery Disease  - Heart failure  - Heart attack  - Stroke  - DVT/VTE  - Cardiac arrhythmia  - Respiratory Failure/COPD  - Renal failure  - Anemia  - Advanced Liver disease      Guy Sandifer 02/17/2023, 7:50 AM

## 2023-02-17 NOTE — Plan of Care (Signed)
  Problem: Education: Goal: Knowledge of the prescribed therapeutic regimen will improve Outcome: Progressing   Problem: Activity: Goal: Ability to avoid complications of mobility impairment will improve Outcome: Progressing   Problem: Pain Management: Goal: Pain level will decrease with appropriate interventions Outcome: Progressing   

## 2023-02-17 NOTE — Progress Notes (Signed)
Orthopedic Tech Progress Note Patient Details:  Wendy Hubbard Jan 17, 1956 [id number]  CPM Left Knee CPM Left Knee: On Left Knee Flexion (Degrees): 45 Left Knee Extension (Degrees): 0  Post Interventions Patient Tolerated: Wendy Hubbard 02/17/2023, 10:54 AM

## 2023-02-17 NOTE — Progress Notes (Signed)
Physical Therapy Treatment Patient Details Name: Wendy Hubbard MRN: [id number] DOB: 21-May-1955 Today's Date: 02/17/2023   History of Present Illness 67 y.o. female admitted 02/15/23 for L TKA. PMH: back pain, DM, partial gastrectomy    PT Comments  Pt comes to sitting EOB without assist, using gait betl to self assist. Pt powers to stand with supv, LLE extended for comfort. Pt amb 50 ft with RW, minimal to no L knee flexion throughout gait cycle. Return to room and pt tolerates ankle pumps and quad sets then requests rest break. Pt reports good spouse support at home, confidence in stair training yesterday and verbalizes good knowledge regarding HEP. Pt voicing concerns regarding pain, BP, and CPM- relayed messages to nursing.    If plan is discharge home, recommend the following: Assistance with cooking/housework;Assist for transportation;Help with stairs or ramp for entrance   Can travel by private vehicle        Equipment Recommendations  None recommended by PT    Recommendations for Other Services       Precautions / Restrictions Precautions Precautions: Knee;Fall Restrictions Weight Bearing Restrictions: No LLE Weight Bearing: Weight bearing as tolerated     Mobility  Bed Mobility Overal bed mobility: Modified Independent             General bed mobility comments: self assisting LLE with gait belt, increased time and effort    Transfers Overall transfer level: Needs assistance Equipment used: Rolling walker (2 wheels) Transfers: Sit to/from Stand Sit to Stand: Supervision           General transfer comment: supv to power to stand, increased effort to power up to standing, LLE extended for comfort with rising and lowering    Ambulation/Gait Ambulation/Gait assistance: Supervision Gait Distance (Feet): 50 Feet Assistive device: Rolling walker (2 wheels) Gait Pattern/deviations: Step-to pattern, Decreased stride length, Decreased stance time - left, Decreased  weight shift to left Gait velocity: decreased     General Gait Details: step-to gait pattern with decreased LLE stance time and weight-bearing, limited by pain reporting 7/10, minimal to no knee flexion throughout gait cycle with flat foot posture   Stairs             Wheelchair Mobility     Tilt Bed    Modified Rankin (Stroke Patients Only)       Balance Overall balance assessment: Needs assistance         Standing balance support: During functional activity Standing balance-Leahy Scale: Fair Standing balance comment: static able to release BUE, dynamic with RW                            Cognition Arousal: Alert Behavior During Therapy: WFL for tasks assessed/performed Overall Cognitive Status: Within Functional Limits for tasks assessed                                 General Comments: pt emotional regarding post-op recovery, responds well to encouragement        Exercises Total Joint Exercises Ankle Circles/Pumps: AROM, Both, 15 reps, Seated Quad Sets: Strengthening, Left, 10 reps, Seated    General Comments        Pertinent Vitals/Pain Pain Assessment Pain Assessment: 0-10 Pain Score: 7  Pain Location: L knee Pain Descriptors / Indicators: Discomfort, Grimacing, Guarding Pain Intervention(s): Limited activity within patient's tolerance, Monitored during session, Premedicated before session,  Repositioned, Ice applied    Home Living                          Prior Function            PT Goals (current goals can now be found in the care plan section) Acute Rehab PT Goals Patient Stated Goal: go to husband's class reunion 10/18 PT Goal Formulation: With patient/family Time For Goal Achievement: 02/22/23 Progress towards PT goals: Progressing toward goals    Frequency    7X/week      PT Plan      Co-evaluation              AM-PAC PT "6 Clicks" Mobility   Outcome Measure  Help needed  turning from your back to your side while in a flat bed without using bedrails?: None Help needed moving from lying on your back to sitting on the side of a flat bed without using bedrails?: None Help needed moving to and from a bed to a chair (including a wheelchair)?: A Little Help needed standing up from a chair using your arms (e.g., wheelchair or bedside chair)?: A Little Help needed to walk in hospital room?: A Little Help needed climbing 3-5 steps with a railing? : A Little 6 Click Score: 20    End of Session Equipment Utilized During Treatment: Gait belt Activity Tolerance: Patient tolerated treatment well Patient left: in chair;with call bell/phone within reach;with chair alarm set Nurse Communication: Mobility status;Other (comment) (CPM request) PT Visit Diagnosis: Difficulty in walking, not elsewhere classified (R26.2);Pain Pain - Right/Left: Left Pain - part of body: Knee     Time: 4098-1191 PT Time Calculation (min) (ACUTE ONLY): 24 min  Charges:    $Gait Training: 8-22 mins $Therapeutic Exercise: 8-22 mins PT General Charges $$ ACUTE PT VISIT: 1 Visit                     Tori Debanhi Blaker PT, DPT 02/17/23, 10:36 AM

## 2023-02-17 NOTE — Progress Notes (Signed)
Physical Therapy Treatment Patient Details Name: Wendy Hubbard MRN: [id number] DOB: [birth year] Today's Date: 02/17/2023   History of Present Illness 67 y.o. female admitted 02/15/23 for L TKA. PMH: back pain, DM, partial gastrectomy    PT Comments  POD # 2 pm session Pt was self able to get OOB, amb to the bathroom, perform self peri care, wash her hands, brush her teeth, amb in the hallway and navigate the 2 steps she has to enter her home.   Pain is controlled.  Pt has MET her maximal mobility goals to D/C to home today.       If plan is discharge home, recommend the following: Assistance with cooking/housework;Assist for transportation;Help with stairs or ramp for entrance   Can travel by private vehicle        Equipment Recommendations  None recommended by PT    Recommendations for Other Services       Precautions / Restrictions Precautions Precautions: Knee;Fall Precaution Comments: reviewed no pillow under L knee Restrictions Weight Bearing Restrictions: No LLE Weight Bearing: Weight bearing as tolerated     Mobility  Bed Mobility Overal bed mobility: Modified Independent             General bed mobility comments: self assisting LLE with gait belt, increased time and effort    Transfers Overall transfer level: Needs assistance Equipment used: Rolling walker (2 wheels) Transfers: Sit to/from Stand Sit to Stand: Modified independent (Device/Increase time), Supervision           General transfer comment: self able in/OOB as well as on/off toilet.  Able to perform self peri care and static standing at sink to wash hands and brush her teeth.    Ambulation/Gait Ambulation/Gait assistance: Supervision, Modified independent (Device/Increase time) Gait Distance (Feet): 85 Feet Assistive device: Rolling walker (2 wheels) Gait Pattern/deviations: Step-to pattern, Decreased stride length, Decreased stance time - left, Decreased weight shift to left Gait  velocity: decreased     General Gait Details: tolerated an increased distance at Supervision level amb to bathroom then in hallway.   Stairs Stairs: Yes Stairs assistance: Supervision, Contact guard assist Stair Management: One rail Left, Step to pattern, Forwards, Sideways Number of Stairs: 2 General stair comments: practiced up/down 2 steps using B UE's on ONE rail.  Pt able to recall "up with the Good and down with the Bad" from PT session yesterday.   Wheelchair Mobility     Tilt Bed    Modified Rankin (Stroke Patients Only)       Balance                                            Cognition Arousal: Alert Behavior During Therapy: WFL for tasks assessed/performed Overall Cognitive Status: Within Functional Limits for tasks assessed                                 General Comments: Reported a "bad night" last night due to pain.        Exercises      General Comments        Pertinent Vitals/Pain Pain Assessment Pain Assessment: Faces Faces Pain Scale: Hurts little more Pain Location: L knee Pain Descriptors / Indicators: Discomfort, Grimacing, Guarding, Operative site guarding Pain Intervention(s): Monitored during session, Premedicated before session, Repositioned, Ice  applied    Home Living                          Prior Function            PT Goals (current goals can now be found in the care plan section) Acute Rehab PT Goals Patient Stated Goal: go to husband's class reunion 10/18 PT Goal Formulation: With patient/family Time For Goal Achievement: 02/22/23 Progress towards PT goals: Progressing toward goals    Frequency    7X/week      PT Plan      Co-evaluation              AM-PAC PT "6 Clicks" Mobility   Outcome Measure  Help needed turning from your back to your side while in a flat bed without using bedrails?: None Help needed moving from lying on your back to sitting on the  side of a flat bed without using bedrails?: None Help needed moving to and from a bed to a chair (including a wheelchair)?: None Help needed standing up from a chair using your arms (e.g., wheelchair or bedside chair)?: None Help needed to walk in hospital room?: None Help needed climbing 3-5 steps with a railing? : None 6 Click Score: 24    End of Session Equipment Utilized During Treatment: Gait belt Activity Tolerance: Patient tolerated treatment well Patient left: in bed;with call bell/phone within reach Nurse Communication: Mobility status;Other (comment) PT Visit Diagnosis: Difficulty in walking, not elsewhere classified (R26.2);Pain Pain - Right/Left: Left Pain - part of body: Knee     Time: 8295-6213 PT Time Calculation (min) (ACUTE ONLY): 29 min  Charges:    $Gait Training: 8-22 mins $Therapeutic Exercise: 8-22 mins $Therapeutic Activity: 8-22 mins PT General Charges $$ ACUTE PT VISIT: 1 Visit                    Felecia Shelling  PTA Acute  Rehabilitation Services Office M-F          [phone]

## 2023-02-18 ENCOUNTER — Other Ambulatory Visit: Payer: Self-pay | Admitting: Family Medicine

## 2023-02-18 NOTE — Telephone Encounter (Signed)
Pt was advised the recommendations Your blood pressure was generally stable after your surgery, it is typical for there to be a slight increase in blood pressure from baseline after surgery particularly due to pain.  I will be a priority to manage your pain while you are at home with Tylenol, pain meds, and the muscle relaxant.  Continue checking your blood pressure when you do get home.  If you are not doing so already, increase furosemide to 40 mg daily and resume metoprolol 50 mg daily.   Pt will continue to monitor the BP.

## 2023-03-05 ENCOUNTER — Other Ambulatory Visit: Payer: Self-pay | Admitting: Family Medicine

## 2023-03-05 DIAGNOSIS — G2581 Restless legs syndrome: Secondary | ICD-10-CM

## 2023-03-05 DIAGNOSIS — J301 Allergic rhinitis due to pollen: Secondary | ICD-10-CM

## 2023-04-15 ENCOUNTER — Other Ambulatory Visit: Payer: Self-pay | Admitting: Nurse Practitioner

## 2023-04-15 ENCOUNTER — Other Ambulatory Visit: Payer: Self-pay | Admitting: Family Medicine

## 2023-04-15 DIAGNOSIS — I10 Essential (primary) hypertension: Secondary | ICD-10-CM

## 2023-04-15 DIAGNOSIS — G629 Polyneuropathy, unspecified: Secondary | ICD-10-CM

## 2023-04-15 DIAGNOSIS — M26609 Unspecified temporomandibular joint disorder, unspecified side: Secondary | ICD-10-CM

## 2023-04-15 DIAGNOSIS — R002 Palpitations: Secondary | ICD-10-CM

## 2023-04-15 DIAGNOSIS — J3089 Other allergic rhinitis: Secondary | ICD-10-CM

## 2023-04-16 NOTE — Telephone Encounter (Signed)
PDMP reviewed, no aberrancies.  Overdose risk score 220.  Refilled metoprolol and pregabalin.  Refill of methocarbamol not appropriate, recommended to discontinue upon discharge from hospital.

## 2023-04-20 ENCOUNTER — Ambulatory Visit: Payer: Self-pay | Admitting: Family Medicine

## 2023-04-20 ENCOUNTER — Other Ambulatory Visit: Payer: Medicare Other

## 2023-04-20 NOTE — Telephone Encounter (Signed)
Pt requesting Bupap as this has worked for her in the past  Chief Complaint: migraine Symptoms: HA Frequency: started over night, last migraine was a few weeks ago Pertinent Negatives: Patient denies neck stiffness, denies fever, denies injury, denies vision changes, denies numbness/tingling,   Disposition: [] ED /[x] Urgent Care (no appt availability in office) / [] Appointment(In office/virtual)/ []  [place] [facility]/ [] Home Care/ [x] Refused Recommended Disposition /[] [place] [facility]/ []  Follow-up with PCP  Additional Notes: Attempted to sched pt today/tomorrow, no appts available, pt declines UC/ED.  Advised to attempt taking OTC medications, dark room, and low noise to resolve HA. Pt states she has been under a lot of stress d/t loss of job. Pt advised to seek UC/Ed or call us back if s/s increase. Pt has appt for 12/17 already.  Copied from CRM [id number]. Topic: Appointments - Appointment Cancel/Reschedule >> Apr 20, 2023 10:48 AM Tiffany H wrote: Patient called to reschedule 04/20/23 lab. Patient advised that she was up all night and now she has a migraine. She once took medicine for migraines but it is not currently listed and she doesn't know the name. Reason for Disposition  Headache is a chronic symptom (recurrent or ongoing AND present > 4 weeks)  Answer Assessment - Initial Assessment Questions 1. LOCATION: "Where does it hurt?"      Behind L eye 2. ONSET: "When did the headache start?" (Minutes, hours or days)      Woke up with it 3. PATTERN: "Does the pain come and go, or has it been constant since it started?"     constant 4. SEVERITY: "How bad is the pain?" and "What does it keep you from doing?"  (e.g., Scale 1-10; mild, moderate, or severe)   - MILD (1-3): doesn't interfere with normal activities    - MODERATE (4-7): interferes with normal activities or awakens from sleep    - SEVERE (8-10): excruciating pain, unable to do any normal activities        9 5.  RECURRENT SYMPTOM: "Have you ever had headaches before?" If Yes, ask: "When was the last time?" and "What happened that time?"      Hx of migraines, last one was a couple of weeks ago, sleep fixed last migraine. 6. CAUSE: "What do you think is causing the headache?"     Stress, recent job loss 7. MIGRAINE: "Have you been diagnosed with migraine headaches?" If Yes, ask: "Is this headache similar?"      Migraine, yes similar 8. HEAD INJURY: "Has there been any recent injury to the head?"      denies 9. OTHER SYMPTOMS: "Do you have any other symptoms?" (fever, stiff neck, eye pain, sore throat, cold symptoms)     denies  Protocols used: Headache-A-AH

## 2023-04-20 NOTE — Telephone Encounter (Signed)
Noted.  At next appointment with primary care provider, will discuss headaches further and determine whether they are migraine versus tension versus cluster to determine appropriate abortive therapy for [place]-term management.

## 2023-04-21 ENCOUNTER — Other Ambulatory Visit: Payer: Medicare Other

## 2023-04-21 DIAGNOSIS — I10 Essential (primary) hypertension: Secondary | ICD-10-CM

## 2023-04-21 DIAGNOSIS — Z1159 Encounter for screening for other viral diseases: Secondary | ICD-10-CM

## 2023-04-21 DIAGNOSIS — E1142 Type 2 diabetes mellitus with diabetic polyneuropathy: Secondary | ICD-10-CM

## 2023-04-21 DIAGNOSIS — E785 Hyperlipidemia, unspecified: Secondary | ICD-10-CM

## 2023-04-22 LAB — CBC WITH DIFFERENTIAL/PLATELET
Basophils Absolute: 0.1 10*3/uL (ref 0.0–0.2)
Basos: 1 %
EOS (ABSOLUTE): 0.2 10*3/uL (ref 0.0–0.4)
Eos: 3 %
Hematocrit: 43 % (ref 34.0–46.6)
Hemoglobin: 14.3 g/dL (ref 11.1–15.9)
Immature Grans (Abs): 0 10*3/uL (ref 0.0–0.1)
Immature Granulocytes: 0 %
Lymphocytes Absolute: 2.7 10*3/uL (ref 0.7–3.1)
Lymphs: 30 %
MCH: 30.6 pg (ref 26.6–33.0)
MCHC: 33.3 g/dL (ref 31.5–35.7)
MCV: 92 fL (ref 79–97)
Monocytes Absolute: 0.5 10*3/uL (ref 0.1–0.9)
Monocytes: 6 %
Neutrophils Absolute: 5.3 10*3/uL (ref 1.4–7.0)
Neutrophils: 60 %
Platelets: 399 10*3/uL (ref 150–450)
RBC: 4.67 x10E6/uL (ref 3.77–5.28)
RDW: 11.8 % (ref 11.7–15.4)
WBC: 8.8 10*3/uL (ref 3.4–10.8)

## 2023-04-22 LAB — COMPREHENSIVE METABOLIC PANEL
ALT: 12 [IU]/L (ref 0–32)
AST: 15 [IU]/L (ref 0–40)
Albumin: 4.3 g/dL (ref 3.9–4.9)
Alkaline Phosphatase: 163 [IU]/L — ABNORMAL HIGH (ref 44–121)
BUN/Creatinine Ratio: 10 — ABNORMAL LOW (ref 12–28)
BUN: 9 mg/dL (ref 8–27)
Bilirubin Total: 0.4 mg/dL (ref 0.0–1.2)
CO2: 27 mmol/L (ref 20–29)
Calcium: 9.4 mg/dL (ref 8.7–10.3)
Chloride: 103 mmol/L (ref 96–106)
Creatinine, Ser: 0.9 mg/dL (ref 0.57–1.00)
Globulin, Total: 2.5 g/dL (ref 1.5–4.5)
Glucose: 131 mg/dL — ABNORMAL HIGH (ref 70–99)
Potassium: 3.8 mmol/L (ref 3.5–5.2)
Sodium: 145 mmol/L — ABNORMAL HIGH (ref 134–144)
Total Protein: 6.8 g/dL (ref 6.0–8.5)
eGFR: 70 mL/min/{1.73_m2} (ref >59)

## 2023-04-22 LAB — HEMOGLOBIN A1C
Est. average glucose Bld gHb Est-mCnc: 131 mg/dL
Hgb A1c MFr Bld: 6.2 % — ABNORMAL HIGH (ref 4.8–5.6)

## 2023-04-22 LAB — LIPID PANEL
Chol/HDL Ratio: 2.5 {ratio} (ref 0.0–4.4)
Cholesterol, Total: 155 mg/dL (ref 100–199)
HDL: 62 mg/dL (ref >39)
LDL Chol Calc (NIH): 59 mg/dL (ref 0–99)
Triglycerides: 209 mg/dL — ABNORMAL HIGH (ref 0–149)
VLDL Cholesterol Cal: 34 mg/dL (ref 5–40)

## 2023-04-22 LAB — HEPATITIS C ANTIBODY: Hep C Virus Ab: NONREACTIVE

## 2023-04-22 LAB — HIV ANTIBODY (ROUTINE TESTING W REFLEX): HIV Screen 4th Generation wRfx: NONREACTIVE

## 2023-04-27 ENCOUNTER — Encounter: Payer: Self-pay | Admitting: Family Medicine

## 2023-04-27 ENCOUNTER — Telehealth: Payer: Self-pay | Admitting: Internal Medicine

## 2023-04-27 ENCOUNTER — Ambulatory Visit (INDEPENDENT_AMBULATORY_CARE_PROVIDER_SITE_OTHER): Payer: Medicare Other | Admitting: Family Medicine

## 2023-04-27 VITALS — BP 148/79 | HR 102 | Resp 18 | Ht 64.0 in | Wt 206.0 lb

## 2023-04-27 DIAGNOSIS — I10 Essential (primary) hypertension: Secondary | ICD-10-CM | POA: Diagnosis not present

## 2023-04-27 DIAGNOSIS — R000 Tachycardia, unspecified: Secondary | ICD-10-CM

## 2023-04-27 DIAGNOSIS — I739 Peripheral vascular disease, unspecified: Secondary | ICD-10-CM

## 2023-04-27 DIAGNOSIS — E1169 Type 2 diabetes mellitus with other specified complication: Secondary | ICD-10-CM

## 2023-04-27 DIAGNOSIS — I700 Atherosclerosis of aorta: Secondary | ICD-10-CM

## 2023-04-27 DIAGNOSIS — E1142 Type 2 diabetes mellitus with diabetic polyneuropathy: Secondary | ICD-10-CM

## 2023-04-27 DIAGNOSIS — E785 Hyperlipidemia, unspecified: Secondary | ICD-10-CM

## 2023-04-27 MED ORDER — WEGOVY 0.25 MG/0.5ML ~~LOC~~ SOAJ
0.2500 mg | SUBCUTANEOUS | 0 refills | Status: DC
Start: 2023-04-27 — End: 2023-05-18

## 2023-04-27 MED ORDER — CILOSTAZOL 100 MG PO TABS: 100.0000 mg | ORAL_TABLET | Freq: Two times a day (BID) | ORAL | 3 refills | Status: DC

## 2023-04-27 NOTE — Telephone Encounter (Signed)
Received request for recent dexa from [facility]. Sent fax back letting them know patient last seen 05/31/20-Wendy Hubbard

## 2023-04-27 NOTE — Assessment & Plan Note (Signed)
Last lipid panel: LDL 59, HDL 62, triglycerides 209.  LDL at goal, triglycerides still elevated but have improved from 242.  Continue rosuvastatin 10 mg daily.  Will continue to monitor.

## 2023-04-27 NOTE — Progress Notes (Signed)
Established Patient Office Visit  Subjective   Patient ID: Wendy Hubbard, female    DOB: 1955-10-03  Age: 67 y.o. MRN: [id number]  Chief Complaint  Patient presents with   Hypertension    HPI Wendy Hubbard is a 67 y.o. female presenting today for follow up of hypertension, hyperlipidemia, diabetes. She is about 2 months out from her knee surgery.  Currently having 6/10 pain, next scheduled follow-up with sports medicine on 05/17/2023. Hypertension: Patient here for follow-up of elevated blood pressure.  Pt denies chest pain, SOB, dizziness, edema, syncope, fatigue or heart palpitations. Taking metoprolol succinate, furosemide, reports excellent compliance with treatment. Denies side effects. Hyperlipidemia: tolerating rosuvastatin well with no myalgias or significant side effects.  The 10-year ASCVD risk score (Arnett DK, et al., 2019) is: 19.3% Diabetes: denies hypoglycemic events, wounds or sores that are not healing well, increased thirst or urination. Denies vision problems, eye exam due.  Managing with nutrition and physical activity for the time being.  She called her insurance and the agent informed her that they will cover Wegovy as Yanni as it is documented that she has both diabetes as well as a heart condition.  Outpatient Medications Prior to Visit  Medication Sig   alosetron (LOTRONEX) 1 MG tablet Take 1 tablet (1 mg total) by mouth 2 (two) times daily.   ALPRAZolam (XANAX) 0.5 MG tablet TAKE 1/2 TO 1 TABLET BY MOUTH ONCE DAILYAS NEEDED FOR ACUTE ANXIETY   apixaban (ELIQUIS) 2.5 MG TABS tablet Take 1 tablet (2.5 mg total) by mouth every 12 (twelve) hours.   Blood Glucose Monitoring Suppl DEVI 1 each by Does not apply route in the morning, at noon, and at bedtime. May substitute to any manufacturer covered by patient's insurance.   desloratadine (CLARINEX) 5 MG tablet TAKE 1 TABLET BY MOUTH ONCE DAILY   DULoxetine (CYMBALTA) 20 MG capsule Take 1 capsule (20 mg total) by mouth daily.  (Patient taking differently: Take 20 mg by mouth every evening.)   escitalopram (LEXAPRO) 10 MG tablet Take 1 tablet (10 mg total) by mouth daily. (Patient taking differently: Take 10 mg by mouth every evening.)   furosemide (LASIX) 20 MG tablet Take 1 tablet (20 mg total) by mouth daily.   Glucose Blood (BLOOD GLUCOSE TEST STRIPS) STRP 1 each by In Vitro route daily. May substitute to any manufacturer covered by patient's insurance.   Lancet Device MISC 1 each by Does not apply route daily. May substitute to any manufacturer covered by patient's insurance.   Lancets Misc. MISC 1 each by Does not apply route daily. May substitute to any manufacturer covered by patient's insurance.   loperamide (IMODIUM) 2 MG capsule Take 4 mg po once. Repeat with 2mg  po after each loose stool. Max dose is 16 mg/day   methocarbamol (ROBAXIN) 500 MG tablet Take 2 tablets (1,000 mg total) by mouth every 6 (six) hours as needed for muscle spasms.   metoprolol succinate (TOPROL-XL) 50 MG 24 hr tablet Take 1 tablet (50 mg total) by mouth every evening.   mirtazapine (REMERON) 15 MG tablet Take 1 tablet (15 mg total) by mouth at bedtime.   montelukast (SINGULAIR) 10 MG tablet TAKE 1 TABLET BY MOUTH AT BEDTIME   oxyCODONE (OXY IR/ROXICODONE) 5 MG immediate release tablet Take 1-2 tablets (5-10 mg total) by mouth every 6 (six) hours as needed for moderate pain (pain score 4-6).   pantoprazole (PROTONIX) 40 MG tablet TAKE 1 TABLET BY MOUTH ONCE DAILY  pregabalin (LYRICA) 50 MG capsule TAKE 1 CAPSULE BY MOUTH TWICE DAILY   rOPINIRole (REQUIP) 2 MG tablet TAKE 1 TABLET BY MOUTH AT BEDTIME   rosuvastatin (CRESTOR) 10 MG tablet Take 1 tablet (10 mg total) by mouth daily. (Patient taking differently: Take 10 mg by mouth at bedtime.)   Specialty Vitamins Products (HAIR BOOSTER PO) Take 2 capsules by mouth daily. Moerie Ultimate Hair Boost Supplement (hyaluronic acid/vitamin b7/vitamin b9/vitamin b12/vitamin  b6/zinc/selenium/copper/vitamin e)   [DISCONTINUED] cilostazol (PLETAL) 100 MG tablet Take 1 tablet (100 mg total) by mouth 2 (two) times daily.   No facility-administered medications prior to visit.    ROS Negative unless otherwise noted in HPI   Objective:     BP (!) 148/79 (BP Location: Left Arm, Patient Position: Sitting, Cuff Size: Large)   Pulse (!) 102   Resp 18   Ht 5\' 4"  (1.626 m)   Wt 206 lb (93.4 kg)   SpO2 100%   BMI 35.36 kg/m   Physical Exam Constitutional:      General: She is not in acute distress.    Appearance: Normal appearance.  HENT:     Head: Normocephalic and atraumatic.  Cardiovascular:     Rate and Rhythm: Normal rate and regular rhythm.     Heart sounds: No murmur heard.    No friction rub. No gallop.  Pulmonary:     Effort: Pulmonary effort is normal. No respiratory distress.     Breath sounds: No wheezing, rhonchi or rales.  Skin:    General: Skin is warm and dry.  Neurological:     Mental Status: She is alert and oriented to person, place, and time.      Assessment & Plan:  Essential hypertension Assessment & Plan: BP goal <130/80.  Blood pressure above goal in office. For now, keep blood pressure log and continue metoprolol succinate 50 mg daily, furosemide 20 mg daily.  If blood pressure remains elevated even after improved pain control, recommend starting valsartan 80 mg daily.  Will continue to monitor.  Orders: -     Wegovy; Inject 0.25 mg into the skin once a week. Use this dose for 1 month (4 shots) and then increase to next higher dose.  Dispense: 2 mL; Refill: 0  Hyperlipidemia associated with type 2 diabetes mellitus (HCC) Assessment & Plan: Last lipid panel: LDL 59, HDL 62, triglycerides 209.  LDL at goal, triglycerides still elevated but have improved from 242.  Continue rosuvastatin 10 mg daily.  Will continue to monitor.  Orders: -     Wegovy; Inject 0.25 mg into the skin once a week. Use this dose for 1 month (4 shots)  and then increase to next higher dose.  Dispense: 2 mL; Refill: 0  Type 2 diabetes mellitus with peripheral neuropathy (HCC) Assessment & Plan: Most recent A1c 6.2, given recent surgery not surprising that A1c increased slightly.  Patient would greatly benefit from GLP-1 therapy for glycemic control as well as his weight management with BMI 35.36 and comorbidities of essential hypertension, aortic atherosclerosis, rapid resting heart rate, hyperlipidemia.  Start Wegovy 0.25 mg weekly injection, sending prescription following insurance agent instructions to associate with diabetes and associated comorbidities.  If Reginal Lutes is still not covered, recommend sending Ozempic prescription instead.  Orders: -     Wegovy; Inject 0.25 mg into the skin once a week. Use this dose for 1 month (4 shots) and then increase to next higher dose.  Dispense: 2 mL; Refill: 0  Intermittent  claudication (HCC) -     Cilostazol; Take 1 tablet (100 mg total) by mouth 2 (two) times daily.  Dispense: 180 tablet; Refill: 3  Aortic atherosclerosis (HCC) -     FAOZHY; Inject 0.25 mg into the skin once a week. Use this dose for 1 month (4 shots) and then increase to next higher dose.  Dispense: 2 mL; Refill: 0  Morbid obesity (HCC) -     QMVHQI; Inject 0.25 mg into the skin once a week. Use this dose for 1 month (4 shots) and then increase to next higher dose.  Dispense: 2 mL; Refill: 0  Rapid resting heart rate -     Wegovy; Inject 0.25 mg into the skin once a week. Use this dose for 1 month (4 shots) and then increase to next higher dose.  Dispense: 2 mL; Refill: 0    Return in about 3 months (around 07/26/2023) for follow-up for HTN, DM, POC A1C at visit.    Melida Quitter, PA

## 2023-04-27 NOTE — Assessment & Plan Note (Addendum)
BP goal <130/80.  Blood pressure above goal in office. For now, keep blood pressure log and continue metoprolol succinate 50 mg daily, furosemide 20 mg daily.  If blood pressure remains elevated even after improved pain control, recommend starting valsartan 80 mg daily.  Will continue to monitor.

## 2023-04-27 NOTE — Assessment & Plan Note (Addendum)
Most recent A1c 6.2, given recent surgery not surprising that A1c increased slightly.  Patient would greatly benefit from GLP-1 therapy for glycemic control as well as his weight management with BMI 35.36 and comorbidities of essential hypertension, aortic atherosclerosis, rapid resting heart rate, hyperlipidemia.  Start Wegovy 0.25 mg weekly injection, sending prescription following insurance agent instructions to associate with diabetes and associated comorbidities.  If Reginal Lutes is still not covered, recommend sending Ozempic prescription instead.

## 2023-05-02 ENCOUNTER — Other Ambulatory Visit: Payer: Self-pay | Admitting: Family Medicine

## 2023-05-02 NOTE — Progress Notes (Signed)
Received on call phone call from patient about elevated blood pressure of 232/101 with HR in 140s. Rechecked her blood pressure and it had come down systolic 150s and hr was in the 110s. Spoke to patient and recommended ER for elevated heart rate. Pt said she didn't want to go to ED. Explained I was concerned about arrhythmia given elevated hr. She remains asymptomatic for BP and denies chest pain, shortness of breath, blurry vision. advised her to go to the ED for further evaluation. Pt verbalized understanding.

## 2023-05-03 ENCOUNTER — Telehealth: Payer: Self-pay | Admitting: Family Medicine

## 2023-05-03 ENCOUNTER — Encounter: Payer: Self-pay | Admitting: Family Medicine

## 2023-05-03 MED ORDER — NITROFURANTOIN MONOHYD MACRO 100 MG PO CAPS: 100.0000 mg | ORAL_CAPSULE | Freq: Two times a day (BID) | ORAL | 0 refills | Status: AC

## 2023-05-03 NOTE — Telephone Encounter (Signed)
Can we call the patient and ask her what her symptoms specifically are, and when they started?  The over-the-counter treatments are for symptom relief of urinary burning.  This includes Azo or generic equivalent.  If she needs antibiotics I would need more information about her symptoms and preferably a urine sample to test.

## 2023-05-03 NOTE — Telephone Encounter (Signed)
Called pt she stated that it started Friday frequency and its painful she tried  Azo cranberry juice and drank plenty of water she said that did not help

## 2023-05-03 NOTE — Addendum Note (Signed)
Addended by: Sandre Kitty on: 05/03/2023 04:35 PM   Modules accepted: Orders

## 2023-05-03 NOTE — Telephone Encounter (Signed)
I'm sending in a prescription for nitrofurantoin for 5 days since we will be closed for 2 days and she cannot provide a urine sample at that time.  Has an allergy to pcn/cephalosporins.  I will send her a mychart message regarding this.

## 2023-05-03 NOTE — Telephone Encounter (Signed)
Copied from CRM [id number]. Topic: Clinical - Medical Advice >> May 03, 2023  2:02 PM Ivette P wrote: Reason for CRM: Patient has a UTI and is asking for some advice on medication.

## 2023-05-12 HISTORY — PX: KNEE ARTHROSCOPY: SHX127

## 2023-05-13 DIAGNOSIS — Z96652 Presence of left artificial knee joint: Secondary | ICD-10-CM | POA: Diagnosis not present

## 2023-05-13 DIAGNOSIS — M1712 Unilateral primary osteoarthritis, left knee: Secondary | ICD-10-CM | POA: Diagnosis not present

## 2023-05-17 DIAGNOSIS — Z96652 Presence of left artificial knee joint: Secondary | ICD-10-CM | POA: Diagnosis not present

## 2023-05-17 DIAGNOSIS — M2351 Chronic instability of knee, right knee: Secondary | ICD-10-CM | POA: Diagnosis not present

## 2023-05-17 DIAGNOSIS — S83241A Other tear of medial meniscus, current injury, right knee, initial encounter: Secondary | ICD-10-CM | POA: Diagnosis not present

## 2023-05-17 DIAGNOSIS — M24662 Ankylosis, left knee: Secondary | ICD-10-CM | POA: Diagnosis not present

## 2023-05-18 ENCOUNTER — Other Ambulatory Visit: Payer: Self-pay | Admitting: Family Medicine

## 2023-05-18 DIAGNOSIS — E1142 Type 2 diabetes mellitus with diabetic polyneuropathy: Secondary | ICD-10-CM

## 2023-05-18 MED ORDER — OZEMPIC (0.25 OR 0.5 MG/DOSE) 2 MG/1.5ML ~~LOC~~ SOPN
PEN_INJECTOR | SUBCUTANEOUS | 1 refills | Status: DC
Start: 2023-05-18 — End: 2024-01-03

## 2023-05-18 NOTE — Progress Notes (Signed)
 Wegovy denied again. Per discussion, start Ozempic instead.

## 2023-06-09 ENCOUNTER — Other Ambulatory Visit: Payer: Self-pay | Admitting: Family Medicine

## 2023-06-09 DIAGNOSIS — J301 Allergic rhinitis due to pollen: Secondary | ICD-10-CM

## 2023-06-09 DIAGNOSIS — G2581 Restless legs syndrome: Secondary | ICD-10-CM

## 2023-06-10 DIAGNOSIS — M531 Cervicobrachial syndrome: Secondary | ICD-10-CM | POA: Diagnosis not present

## 2023-06-10 DIAGNOSIS — M9903 Segmental and somatic dysfunction of lumbar region: Secondary | ICD-10-CM | POA: Diagnosis not present

## 2023-06-10 DIAGNOSIS — M546 Pain in thoracic spine: Secondary | ICD-10-CM | POA: Diagnosis not present

## 2023-06-10 DIAGNOSIS — M9902 Segmental and somatic dysfunction of thoracic region: Secondary | ICD-10-CM | POA: Diagnosis not present

## 2023-06-10 DIAGNOSIS — M545 Low back pain, unspecified: Secondary | ICD-10-CM | POA: Diagnosis not present

## 2023-06-10 DIAGNOSIS — M9901 Segmental and somatic dysfunction of cervical region: Secondary | ICD-10-CM | POA: Diagnosis not present

## 2023-06-13 DIAGNOSIS — M1712 Unilateral primary osteoarthritis, left knee: Secondary | ICD-10-CM | POA: Diagnosis not present

## 2023-06-13 DIAGNOSIS — Z96652 Presence of left artificial knee joint: Secondary | ICD-10-CM | POA: Diagnosis not present

## 2023-06-15 ENCOUNTER — Encounter (INDEPENDENT_AMBULATORY_CARE_PROVIDER_SITE_OTHER): Payer: Medicare Other | Admitting: Family Medicine

## 2023-06-15 DIAGNOSIS — E1142 Type 2 diabetes mellitus with diabetic polyneuropathy: Secondary | ICD-10-CM

## 2023-06-15 DIAGNOSIS — I10 Essential (primary) hypertension: Secondary | ICD-10-CM | POA: Diagnosis not present

## 2023-06-17 ENCOUNTER — Encounter: Payer: Self-pay | Admitting: Family Medicine

## 2023-06-17 ENCOUNTER — Telehealth: Payer: Self-pay

## 2023-06-17 DIAGNOSIS — Z1231 Encounter for screening mammogram for malignant neoplasm of breast: Secondary | ICD-10-CM

## 2023-06-17 MED ORDER — VALSARTAN 80 MG PO TABS
80.0000 mg | ORAL_TABLET | Freq: Every day | ORAL | 2 refills | Status: DC
Start: 2023-06-17 — End: 2023-10-01

## 2023-06-17 NOTE — Addendum Note (Signed)
 Addended by: Maryclare Smoke on: 06/17/2023 10:05 AM   Modules accepted: Orders

## 2023-06-17 NOTE — Telephone Encounter (Signed)
 LVM informing pt that the order had been placed and she should be able to schedule.

## 2023-06-17 NOTE — Telephone Encounter (Signed)
Please see the MyChart message reply(ies) for my assessment and plan.    This patient gave consent for this Medical Advice Message and is aware that it may result in a bill to their insurance company, as well as the possibility of receiving a bill for a co-payment or deductible. They are an established patient, but are not seeking medical advice exclusively about a problem treated during an in person or video visit in the last seven days. I did not recommend an in person or video visit within seven days of my reply.    I spent a total of 5 minutes cumulative time within 7 days through MyChart messaging.  Morgan A Edstrom, PA   

## 2023-06-17 NOTE — Addendum Note (Signed)
 Addended by: Maryclare Smoke on: 06/17/2023 12:17 PM   Modules accepted: Orders

## 2023-06-17 NOTE — Telephone Encounter (Signed)
 Copied from CRM [id number]. Topic: Clinical - Request for Lab/Test Order >> Jun 17, 2023  8:11 AM Tisa Forester wrote: Reason for CRM: patient request to schedule for a mammogram  Patient to schedule in [place]

## 2023-06-17 NOTE — Telephone Encounter (Signed)
 Orders Placed This Encounter  Procedures   MM 3D SCREENING MAMMOGRAM BILATERAL BREAST

## 2023-06-24 DIAGNOSIS — Z96652 Presence of left artificial knee joint: Secondary | ICD-10-CM | POA: Diagnosis not present

## 2023-06-24 DIAGNOSIS — M2351 Chronic instability of knee, right knee: Secondary | ICD-10-CM | POA: Diagnosis not present

## 2023-06-24 DIAGNOSIS — M24662 Ankylosis, left knee: Secondary | ICD-10-CM | POA: Diagnosis not present

## 2023-06-29 ENCOUNTER — Ambulatory Visit: Payer: Medicare Other

## 2023-07-03 ENCOUNTER — Other Ambulatory Visit: Payer: Self-pay | Admitting: Family Medicine

## 2023-07-03 DIAGNOSIS — F3341 Major depressive disorder, recurrent, in partial remission: Secondary | ICD-10-CM

## 2023-07-11 DIAGNOSIS — Z96652 Presence of left artificial knee joint: Secondary | ICD-10-CM | POA: Diagnosis not present

## 2023-07-11 DIAGNOSIS — M1712 Unilateral primary osteoarthritis, left knee: Secondary | ICD-10-CM | POA: Diagnosis not present

## 2023-07-12 ENCOUNTER — Ambulatory Visit (INDEPENDENT_AMBULATORY_CARE_PROVIDER_SITE_OTHER): Payer: BC Managed Care – PPO | Admitting: Nurse Practitioner

## 2023-07-12 ENCOUNTER — Encounter: Payer: Self-pay | Admitting: Nurse Practitioner

## 2023-07-12 VITALS — BP 156/90 | HR 90 | Temp 98.3°F | Resp 16 | Ht 64.0 in | Wt 207.8 lb

## 2023-07-12 DIAGNOSIS — G894 Chronic pain syndrome: Secondary | ICD-10-CM | POA: Diagnosis not present

## 2023-07-12 DIAGNOSIS — E1142 Type 2 diabetes mellitus with diabetic polyneuropathy: Secondary | ICD-10-CM | POA: Diagnosis not present

## 2023-07-12 DIAGNOSIS — I700 Atherosclerosis of aorta: Secondary | ICD-10-CM

## 2023-07-12 DIAGNOSIS — K219 Gastro-esophageal reflux disease without esophagitis: Secondary | ICD-10-CM

## 2023-07-12 DIAGNOSIS — J301 Allergic rhinitis due to pollen: Secondary | ICD-10-CM

## 2023-07-12 DIAGNOSIS — I10 Essential (primary) hypertension: Secondary | ICD-10-CM | POA: Diagnosis not present

## 2023-07-12 DIAGNOSIS — F411 Generalized anxiety disorder: Secondary | ICD-10-CM

## 2023-07-12 DIAGNOSIS — M150 Primary generalized (osteo)arthritis: Secondary | ICD-10-CM

## 2023-07-12 DIAGNOSIS — E2839 Other primary ovarian failure: Secondary | ICD-10-CM

## 2023-07-12 MED ORDER — FUROSEMIDE 20 MG PO TABS: 40.0000 mg | ORAL_TABLET | Freq: Every day | ORAL | 1 refills | Status: DC

## 2023-07-12 MED ORDER — ALPRAZOLAM 0.5 MG PO TABS
ORAL_TABLET | ORAL | 1 refills | Status: DC
Start: 2023-07-12 — End: 2024-03-08

## 2023-07-12 MED ORDER — PANTOPRAZOLE SODIUM 40 MG PO TBEC
DELAYED_RELEASE_TABLET | ORAL | 0 refills | Status: DC
Start: 2023-07-12 — End: 2023-11-08

## 2023-07-12 MED ORDER — OXYCODONE HCL 5 MG PO TABS: 5.0000 mg | ORAL_TABLET | Freq: Every day | ORAL | 0 refills | Status: DC | PRN

## 2023-07-12 NOTE — Progress Notes (Signed)
 [facility] [address] [place], [place] [address]  Internal MEDICINE  Office Visit Note  Patient Name: Wendy Hubbard  [id number]  [id number]  Date of Service: 07/12/2023   Complaints/HPI Pt is here for establishment of PCP. Chief Complaint  Patient presents with   New Patient (Initial Visit)    HPI Wendy Hubbard presents for a new patient visit to establish care.  Well-appearing 68 y.o. female with history of gastric bypass surgery in 2017, and revision in 2023, type 2 diabetes  Cannot take NSAIDS or baby aspirin Has tried tramadol which does not help.  Work: Development worker, community with [employer]  Home: live wth husband at home  Diet: pretty healthy, low carb  Exercise: not currently due to knee issues  Tobacco use: none  Alcohol use: once or twice a year  Illicit drug use: none  Routine CRC screening:  due in 2032 Routine mammogram:  scheduled this month  DEXA scan:  due now, last doen in 2003 Eye exam: Dr. Clydene Pugh at [facility] has appt his month  Labs: routine labs done in December  New or worsening pain: chronic pain -- left knee, low back and neck pain Needs surgery on right knee    Current Medication: Outpatient Encounter Medications as of 07/12/2023  Medication Sig   alosetron (LOTRONEX) 1 MG tablet Take 1 tablet (1 mg total) by mouth 2 (two) times daily.   Blood Glucose Monitoring Suppl DEVI 1 each by Does not apply route in the morning, at noon, and at bedtime. May substitute to any manufacturer covered by patient's insurance.   cilostazol (PLETAL) 100 MG tablet Take 1 tablet (100 mg total) by mouth 2 (two) times daily.   desloratadine (CLARINEX) 5 MG tablet TAKE 1 TABLET BY MOUTH ONCE DAILY   DULoxetine (CYMBALTA) 20 MG capsule Take 1 capsule (20 mg total) by mouth daily. (Patient taking differently: Take 20 mg by mouth every evening.)   Glucose Blood (BLOOD GLUCOSE TEST STRIPS) STRP 1 each by In Vitro route daily. May substitute  to any manufacturer covered by patient's insurance.   Lancet Device MISC 1 each by Does not apply route daily. May substitute to any manufacturer covered by patient's insurance.   Lancets Misc. MISC 1 each by Does not apply route daily. May substitute to any manufacturer covered by patient's insurance.   loperamide (IMODIUM) 2 MG capsule Take 4 mg po once. Repeat with 2mg  po after each loose stool. Max dose is 16 mg/day   methocarbamol (ROBAXIN) 500 MG tablet Take 2 tablets (1,000 mg total) by mouth every 6 (six) hours as needed for muscle spasms.   metoprolol succinate (TOPROL-XL) 50 MG 24 hr tablet Take 1 tablet (50 mg total) by mouth every evening.   mirtazapine (REMERON) 15 MG tablet TAKE 1 TABLET BY MOUTH AT BEDTIME   montelukast (SINGULAIR) 10 MG tablet TAKE 1 TABLET BY MOUTH AT BEDTIME   pregabalin (LYRICA) 50 MG capsule TAKE 1 CAPSULE BY MOUTH TWICE DAILY   rOPINIRole (REQUIP) 2 MG tablet TAKE 1 TABLET BY MOUTH AT BEDTIME   rosuvastatin (CRESTOR) 10 MG tablet Take 1 tablet (10 mg total) by mouth daily. (Patient taking differently: Take 10 mg by mouth at bedtime.)   Semaglutide,0.25 or 0.5MG /DOS, (OZEMPIC, 0.25 OR 0.5 MG/DOSE,) 2 MG/1.5ML SOPN Inject 0.25 mg subq qwk for 4 wk then inject 0.5 mg subq qwk x4 wk   Specialty Vitamins Products (HAIR BOOSTER PO) Take 2 capsules by mouth daily.  Moerie Ultimate Hair Boost Supplement (hyaluronic acid/vitamin b7/vitamin b9/vitamin b12/vitamin b6/zinc/selenium/copper/vitamin e)   valsartan (DIOVAN) 80 MG tablet Take 1 tablet (80 mg total) by mouth daily.   [DISCONTINUED] ALPRAZolam (XANAX) 0.5 MG tablet TAKE 1/2 TO 1 TABLET BY MOUTH ONCE DAILYAS NEEDED FOR ACUTE ANXIETY   [DISCONTINUED] escitalopram (LEXAPRO) 10 MG tablet Take 1 tablet (10 mg total) by mouth daily. (Patient taking differently: Take 10 mg by mouth every evening.)   [DISCONTINUED] furosemide (LASIX) 20 MG tablet Take 1 tablet (20 mg total) by mouth daily.   [DISCONTINUED] oxyCODONE (OXY  IR/ROXICODONE) 5 MG immediate release tablet Take 1-2 tablets (5-10 mg total) by mouth every 6 (six) hours as needed for moderate pain (pain score 4-6).   [DISCONTINUED] pantoprazole (PROTONIX) 40 MG tablet TAKE 1 TABLET BY MOUTH ONCE DAILY   ALPRAZolam (XANAX) 0.5 MG tablet TAKE 1/2 TO 1 TABLET BY MOUTH ONCE DAILYAS NEEDED FOR ACUTE ANXIETY   furosemide (LASIX) 20 MG tablet Take 2 tablets (40 mg total) by mouth daily.   oxyCODONE (OXY IR/ROXICODONE) 5 MG immediate release tablet Take 1-2 tablets (5-10 mg total) by mouth daily as needed for moderate pain (pain score 4-6) or severe pain (pain score 7-10).   pantoprazole (PROTONIX) 40 MG tablet TAKE 1 TABLET BY MOUTH ONCE DAILY   [DISCONTINUED] apixaban (ELIQUIS) 2.5 MG TABS tablet Take 1 tablet (2.5 mg total) by mouth every 12 (twelve) hours.   No facility-administered encounter medications on file as of 07/12/2023.    Surgical History: Past Surgical History:  Procedure Laterality Date   ABDOMINAL HYSTERECTOMY     ANKLE ARTHROSCOPY Bilateral    APPENDECTOMY     BLADDER SUSPENSION     BREAST EXCISIONAL BIOPSY Left 1990   neg   BREAST EXCISIONAL BIOPSY Right 1989   neg   BREAST SURGERY     CHOLECYSTECTOMY     COLONOSCOPY N/A 11/04/2015   Procedure: COLONOSCOPY;  Surgeon: Scot Jun, MD;  Location: [facility] ENDOSCOPY;  Service: Endoscopy;  Laterality: N/A;   COLONOSCOPY WITH PROPOFOL N/A 08/30/2020   Procedure: COLONOSCOPY WITH PROPOFOL;  Surgeon: Midge Minium, MD;  Location: [facility] SURGERY CNTR;  Service: Endoscopy;  Laterality: N/A;   ESOPHAGOGASTRODUODENOSCOPY N/A 11/02/2015   Procedure: ESOPHAGOGASTRODUODENOSCOPY (EGD);  Surgeon: Scot Jun, MD;  Location: [facility] ENDOSCOPY;  Service: Endoscopy;  Laterality: N/A;   ESOPHAGOGASTRODUODENOSCOPY (EGD) WITH PROPOFOL N/A 03/16/2022   Procedure: ESOPHAGOGASTRODUODENOSCOPY (EGD) WITH PROPOFOL foregin body removal;  Surgeon: Midge Minium, MD;  Location: [facility] SURGERY [facility];  Service: Endoscopy;   Laterality: N/A;  Diabetic   GASTROJEJUNOSTOMY N/A 08/25/2021   Procedure: LAPAROSCOPIC REDO OF STOMACH TO INTESTINE;  Surgeon: Sheliah Hatch, De Blanch, MD;  Location: [facility] ORS;  Service: General;  Laterality: N/A;   KNEE ARTHROSCOPY Left    PARTIAL GASTRECTOMY N/A 08/25/2021   Procedure: PARTIAL GASTRECTOMY;  Surgeon: Sheliah Hatch De Blanch, MD;  Location: [facility] ORS;  Service: General;  Laterality: N/A;   POLYPECTOMY  08/30/2020   Procedure: POLYPECTOMY;  Surgeon: Midge Minium, MD;  Location: [facility];  Service: Endoscopy;;   REDUCTION MAMMAPLASTY Bilateral 1988   TONSILLECTOMY     TOTAL KNEE ARTHROPLASTY Left 02/15/2023   Procedure: TOTAL KNEE ARTHROPLASTY;  Surgeon: Dannielle Huh, MD;  Location: [facility] ORS;  Service: Orthopedics;  Laterality: Left;   UPPER GI ENDOSCOPY  08/25/2021   Procedure: UPPER GI ENDOSCOPY;  Surgeon: Sheliah Hatch, De Blanch, MD;  Location: [facility] ORS;  Service: General;;    Medical History: Past Medical History:  Diagnosis Date  Allergy    Phreesia 07/23/2020   Anxiety    Phreesia 07/23/2020   COPD (chronic obstructive pulmonary disease) (HCC)    DDD (degenerative disc disease), thoracic    Depression    Phreesia 07/23/2020   Diabetes mellitus without complication (HCC)    type 2   GERD (gastroesophageal reflux disease)    Hyperlipidemia    Nausea and vomiting 03/04/2022   Neuromuscular disorder (HCC)    severe neuropathy feet   Pneumonia    Rapid heart rate    Sleep apnea    no longer uses CPAP due to weight loss   TMJ (temporomandibular joint disorder) 11/28/2022   Wears contact lenses     Family History: Family History  Problem Relation Age of Onset   Hypertension Other    Bladder Cancer Mother    High Cholesterol Mother    High blood pressure Mother    Cancer Mother    Heart attack Father    High blood pressure Father    Kidney cancer Neg Hx    Prostate cancer Neg Hx    Breast cancer Neg Hx     Social History   Socioeconomic History    Marital status: Married    Spouse name: Not on file   Number of children: Not on file   Years of education: Not on file   Highest education level: Not on file  Occupational History   Not on file  Tobacco Use   Smoking status: Former    Current packs/day: 0.00    Types: Cigarettes    Start date: 42    Quit date: 1996    Years since quitting: 29.2    Passive exposure: Past   Smokeless tobacco: Never   Tobacco comments:    quit in 1996  Vaping Use   Vaping status: Never Used  Substance and Sexual Activity   Alcohol use: Yes    Alcohol/week: 2.0 standard drinks of alcohol    Types: 1 Glasses of wine, 1 Standard drinks or equivalent per week    Comment: occasional  social   Drug use: No   Sexual activity: Yes    Partners: Male    Birth control/protection: Surgical, Other-see comments  Other Topics Concern   Not on file  Social History Narrative   Not on file   Social Drivers of Health   Financial Resource Strain: Low Risk  (02/02/2023)   Overall Financial Resource Strain (CARDIA)    Difficulty of Paying Living Expenses: Not hard at all  Food Insecurity: No Food Insecurity (02/15/2023)   Hunger Vital Sign    Worried About Running Out of Food in the Last Year: Never true    Ran Out of Food in the Last Year: Never true  Transportation Needs: No Transportation Needs (02/15/2023)   PRAPARE - Administrator, Civil Service (Medical): No    Lack of Transportation (Non-Medical): No  Physical Activity: Inactive (02/02/2023)   Exercise Vital Sign    Days of Exercise per Week: 0 days    Minutes of Exercise per Session: 0 min  Stress: No Stress Concern Present (02/02/2023)   [employer] of Occupational Health - Occupational Stress Questionnaire    Feeling of Stress : Not at all  Social Connections: Socially Integrated (02/02/2023)   Social Connection and Isolation Panel [NHANES]    Frequency of Communication with Friends and Family: More than three times a week     Frequency of Social Gatherings with Friends and Family:  More than three times a week    Attends Religious Services: More than 4 times per year    Active Member of Clubs or Organizations: Yes    Attends Banker Meetings: More than 4 times per year    Marital Status: Married  Catering manager Violence: Not At Risk (02/15/2023)   Humiliation, Afraid, Rape, and Kick questionnaire    Fear of Current or Ex-Partner: No    Emotionally Abused: No    Physically Abused: No    Sexually Abused: No     Review of Systems  Constitutional:  Negative for chills, fatigue and unexpected weight change.  HENT:  Positive for postnasal drip. Negative for congestion, rhinorrhea, sneezing and sore throat.   Eyes:  Negative for redness.  Respiratory: Negative.  Negative for cough, chest tightness, shortness of breath and wheezing.   Cardiovascular: Negative.  Negative for chest pain and palpitations.  Gastrointestinal:  Negative for abdominal pain, constipation, diarrhea, nausea and vomiting.  Genitourinary:  Negative for dysuria and frequency.  Musculoskeletal:  Negative for arthralgias, back pain, joint swelling and neck pain.  Skin:  Negative for rash.  Neurological: Negative.  Negative for tremors and numbness.  Hematological:  Negative for adenopathy. Does not bruise/bleed easily.  Psychiatric/Behavioral:  Negative for behavioral problems (Depression), sleep disturbance and suicidal ideas. The patient is not nervous/anxious.     Vital Signs: BP (!) 156/90   Pulse 90   Temp 98.3 F (36.8 C)   Resp 16   Ht 5\' 4"  (1.626 m)   Wt 207 lb 12.8 oz (94.3 kg)   SpO2 97%   BMI 35.67 kg/m    Physical Exam Vitals reviewed.  Constitutional:      General: She is not in acute distress.    Appearance: Normal appearance. She is obese. She is not ill-appearing.  HENT:     Head: Normocephalic and atraumatic.  Eyes:     Pupils: Pupils are equal, round, and reactive to light.  Cardiovascular:      Rate and Rhythm: Normal rate and regular rhythm.  Pulmonary:     Effort: Pulmonary effort is normal. No respiratory distress.  Skin:    Capillary Refill: Capillary refill takes less than 2 seconds.  Neurological:     Mental Status: She is alert and oriented to person, place, and time.  Psychiatric:        Mood and Affect: Mood normal.        Behavior: Behavior normal.       Assessment/Plan: 1. Type 2 diabetes mellitus with peripheral neuropathy (HCC) (Primary) Last A1c was 6.2 in December last year,   2. Aortic atherosclerosis (HCC) Continue rosuvastatin as prescribed.   3. Essential hypertension Continue furosemide as prescribed. Refills ordered.  - furosemide (LASIX) 20 MG tablet; Take 2 tablets (40 mg total) by mouth daily.  Dispense: 180 tablet; Refill: 1  4. Primary osteoarthritis involving multiple joints Oxycodone prescribed for chronic pain of multiple joints.  - oxyCODONE (OXY IR/ROXICODONE) 5 MG immediate release tablet; Take 1-2 tablets (5-10 mg total) by mouth daily as needed for moderate pain (pain score 4-6) or severe pain (pain score 7-10).  Dispense: 30 tablet; Refill: 0  5. Chronic pain syndrome Prescription for oxycodone sent for patient. She takes this as needed for chronic pain  - oxyCODONE (OXY IR/ROXICODONE) 5 MG immediate release tablet; Take 1-2 tablets (5-10 mg total) by mouth daily as needed for moderate pain (pain score 4-6) or severe pain (pain score 7-10).  Dispense: 30 tablet; Refill: 0  6. Gastroesophageal reflux disease without esophagitis Continue pantoprazole as prescribed.  - pantoprazole (PROTONIX) 40 MG tablet; TAKE 1 TABLET BY MOUTH ONCE DAILY  Dispense: 90 tablet; Refill: 0  7. Seasonal allergic rhinitis due to pollen Continue desloratadine as prescribed.   8. Ovarian failure due to menopause Dexa scan ordered  - DG Bone Density; Future  9. Generalized anxiety disorder Continue alprazolam as prescribed. - ALPRAZolam (XANAX) 0.5 MG  tablet; TAKE 1/2 TO 1 TABLET BY MOUTH ONCE DAILYAS NEEDED FOR ACUTE ANXIETY  Dispense: 30 tablet; Refill: 1    General Counseling: Shaneen verbalizes understanding of the findings of todays visit and agrees with plan of treatment. I have discussed any further diagnostic evaluation that may be needed or ordered today. We also reviewed her medications today. she has been encouraged to call the office with any questions or concerns that should arise related to todays visit.    Orders Placed This Encounter  Procedures   DG Bone Density    Meds ordered this encounter  Medications   pantoprazole (PROTONIX) 40 MG tablet    Sig: TAKE 1 TABLET BY MOUTH ONCE DAILY    Dispense:  90 tablet    Refill:  0   ALPRAZolam (XANAX) 0.5 MG tablet    Sig: TAKE 1/2 TO 1 TABLET BY MOUTH ONCE DAILYAS NEEDED FOR ACUTE ANXIETY    Dispense:  30 tablet    Refill:  1   oxyCODONE (OXY IR/ROXICODONE) 5 MG immediate release tablet    Sig: Take 1-2 tablets (5-10 mg total) by mouth daily as needed for moderate pain (pain score 4-6) or severe pain (pain score 7-10).    Dispense:  30 tablet    Refill:  0   furosemide (LASIX) 20 MG tablet    Sig: Take 2 tablets (40 mg total) by mouth daily.    Dispense:  180 tablet    Refill:  1    Return in about 5 weeks (around 08/18/2023) for F/U, Katalyn Matin PCP.  Time spent:30 Minutes Time spent with patient included reviewing progress notes, labs, imaging studies, and discussing plan for follow up.   [place] Controlled Substance Database was reviewed by me for overdose risk score (ORS)   This patient was seen by Sallyanne Kuster, FNP-C in collaboration with Dr. Beverely Risen as a part of collaborative care agreement.   Lylla Eifler R. Tedd Sias, MSN, FNP-C Internal Medicine

## 2023-07-12 NOTE — Patient Instructions (Addendum)
 Stop lexapro but continue cymbalta and mirtazapine for now

## 2023-07-13 NOTE — Progress Notes (Unsigned)
 PROVIDER NOTE: Information contained herein reflects review and annotations entered in association with encounter. Interpretation of such information and data should be left to medically-trained personnel. Information provided to patient can be located elsewhere in the medical record under "Patient Instructions". Document created using STT-dictation technology, any transcriptional errors that may result from process are unintentional.    Patient: Wendy Hubbard  Service Category: E/M  Provider: Oswaldo Done, MD  DOB: 19-Sep-1955  DOS: 07/14/2023  Referring Provider: Melida Quitter, PA  MRN: [id number]  Specialty: Interventional Pain Management  PCP: Sallyanne Kuster, NP  Type: Established Patient  Setting: Ambulatory outpatient    Location: Office  Delivery: Face-to-face     HPI  Wendy Hubbard, a 68 y.o. year old female, is here today because of her No primary diagnosis found.. Wendy Hubbard primary complain today is No chief complaint on file.  Pertinent problems: Ms. Gadison has Chronic knee pain (2ry area of Pain) (Left); Primary osteoarthritis of left knee; Chronic feet pain (1ry area of Pain) (Bilateral); Migraine without aura and without status migrainosus, not intractable; Type 2 diabetes mellitus with peripheral neuropathy (HCC); Intermittent claudication (HCC); Restless legs syndrome; Other intervertebral disc degeneration, lumbar region; Spondylolisthesis of lumbar region; Chronic shoulder pain (5th area of Pain) (Bilateral) (L>R); Sensorimotor neuropathy; Chronic neck pain (4th area of Pain) (Bilateral) (L>R); Chronic pain syndrome; Chronic low back pain (3ry area of Pain) (Bilateral) (L>R) w/o sciatica; Chronic occipital headache (Left); Cervicogenic headache (Left); Chronic upper extremity pain (7th area of Pain) (Left); Lumbar facet joint pain; Lumbosacral facet arthropathy (Multilevel) (Bilateral); Spondylosis without myelopathy or radiculopathy, lumbosacral region; and TMJ  (temporomandibular joint disorder) on their pertinent problem list. Pain Assessment: Severity of   is reported as a  /10. Location:    / . Onset:  . Quality:  . Timing:  . Modifying factor(s):  Marland Kitchen Vitals:  vitals were not taken for this visit.  BMI: Estimated body mass index is 35.67 kg/m as calculated from the following:   Height as of 07/12/23: 5\' 4"  (1.626 m).   Weight as of 07/12/23: 207 lb 12.8 oz (94.3 kg). Last encounter: 12/15/2022. Last procedure: 11/05/2022.  Reason for encounter:  *** . ***  Discussed the use of AI scribe software for clinical note transcription with the patient, who gave verbal consent to proceed.  History of Present Illness           Pharmacotherapy Assessment  Analgesic: oxycodone/APAP 5/325, 1 tab p.o. 4 times daily (# 20) (last filled on 06/25/2022) MME/day: 30 mg/day   Monitoring: [place] PMP: PDMP reviewed during this encounter.       Pharmacotherapy: No side-effects or adverse reactions reported. Compliance: No problems identified. Effectiveness: Clinically acceptable.  No notes on file  No results found for: "CBDTHCR" No results found for: "D8THCCBX" No results found for: "D9THCCBX"  UDS:  Summary  Date Value Ref Range Status  08/05/2022 Note  Final    Comment:    ==================================================================== Compliance Drug Analysis, Ur ==================================================================== Test                             Result       Flag       Units  Drug Present and Declared for Prescription Verification   Pregabalin                     PRESENT      EXPECTED  Methocarbamol                  PRESENT      EXPECTED   Citalopram                     PRESENT      EXPECTED   Desmethylcitalopram            PRESENT      EXPECTED    Desmethylcitalopram is an expected metabolite of citalopram or the    enantiomeric form, escitalopram.    Duloxetine                     PRESENT      EXPECTED   Mirtazapine                     PRESENT      EXPECTED   Metoprolol                     PRESENT      EXPECTED  Drug Absent but Declared for Prescription Verification   Alprazolam                     Not Detected UNEXPECTED ng/mg creat   Oxycodone                      Not Detected UNEXPECTED ng/mg creat ==================================================================== Test                      Result    Flag   Units      Ref Range   Creatinine              187              mg/dL      >=01 ==================================================================== Declared Medications:  The flagging and interpretation on this report are based on the  following declared medications.  Unexpected results may arise from  inaccuracies in the declared medications.   **Note: The testing scope of this panel includes these medications:   Alprazolam (Xanax)  Duloxetine  Escitalopram (Lexapro)  Methocarbamol (Robaxin)  Metoprolol (Toprol)  Mirtazapine (Remeron)  Oxycodone  Pregabalin (Lyrica)   **Note: The testing scope of this panel does not include the  following reported medications:   Alosetron  Cilostazol (Pletal)  Furosemide (Lasix)  Loperamide (Imodium)  Montelukast (Singulair)  Multivitamin  Pantoprazole (Protonix)  Ropinirole (Requip)  Rosuvastatin (Crestor)  Semaglutide ==================================================================== For clinical consultation, please call [phone]. ====================================================================       ROS  Constitutional: Denies any fever or chills Gastrointestinal: No reported hemesis, hematochezia, vomiting, or acute GI distress Musculoskeletal: Denies any acute onset joint swelling, redness, loss of ROM, or weakness Neurological: No reported episodes of acute onset apraxia, aphasia, dysarthria, agnosia, amnesia, paralysis, loss of coordination, or loss of consciousness  Medication Review  ALPRAZolam, BLOOD GLUCOSE TEST STRIPS,  Blood Glucose Monitoring Suppl, DULoxetine, Lancet Device, Lancets Misc., Semaglutide(0.25 or 0.5MG /DOS), Specialty Vitamins Products, alosetron, cilostazol, desloratadine, furosemide, loperamide, methocarbamol, metoprolol succinate, mirtazapine, montelukast, oxyCODONE, pantoprazole, pregabalin, rOPINIRole, rosuvastatin, and valsartan  History Review  Allergy: Ms. Gosser is allergic to flagyl [metronidazole], penicillins, and doxycycline. Drug: Ms. Najera  reports no history of drug use. Alcohol:  reports current alcohol use of about 2.0 standard drinks of alcohol per week. Tobacco:  reports that she quit smoking about 29 years ago. Her smoking use included cigarettes.  She started smoking about 49 years ago. She has been exposed to tobacco smoke. She has never used smokeless tobacco. Social: Ms. Weichert  reports that she quit smoking about 29 years ago. Her smoking use included cigarettes. She started smoking about 49 years ago. She has been exposed to tobacco smoke. She has never used smokeless tobacco. She reports current alcohol use of about 2.0 standard drinks of alcohol per week. She reports that she does not use drugs. Medical:  has a past medical history of Allergy, Anxiety, COPD (chronic obstructive pulmonary disease) (HCC), DDD (degenerative disc disease), thoracic, Depression, Diabetes mellitus without complication (HCC), GERD (gastroesophageal reflux disease), Hyperlipidemia, Nausea and vomiting (03/04/2022), Neuromuscular disorder (HCC), Pneumonia, Rapid heart rate, Sleep apnea, TMJ (temporomandibular joint disorder) (11/28/2022), and Wears contact lenses. Surgical: Ms. Schroepfer  has a past surgical history that includes Tonsillectomy; Abdominal hysterectomy; Breast surgery; Cholecystectomy; Appendectomy; Ankle arthroscopy (Bilateral); Knee arthroscopy (Left); Bladder suspension; Esophagogastroduodenoscopy (N/A, 11/02/2015); Colonoscopy (N/A, 11/04/2015); Reduction mammaplasty (Bilateral, 1988); Breast  excisional biopsy (Left, 1990); Breast excisional biopsy (Right, 1989); Colonoscopy with propofol (N/A, 08/30/2020); polypectomy (08/30/2020); Gastrojejunostomy (N/A, 08/25/2021); Partial gastrectomy (N/A, 08/25/2021); Upper gi endoscopy (08/25/2021); Esophagogastroduodenoscopy (egd) with propofol (N/A, 03/16/2022); and Total knee arthroplasty (Left, 02/15/2023). Family: family history includes Bladder Cancer in her mother; Cancer in her mother; Heart attack in her father; High Cholesterol in her mother; High blood pressure in her father and mother; Hypertension in an other family member.  Laboratory Chemistry Profile   Renal Lab Results  Component Value Date   BUN 9 04/21/2023   CREATININE 0.90 04/21/2023   BCR 10 (L) 04/21/2023   GFRAA 97 03/07/2020   GFRNONAA 46 (L) 02/03/2023    Hepatic Lab Results  Component Value Date   AST 15 04/21/2023   ALT 12 04/21/2023   ALBUMIN 4.3 04/21/2023   ALKPHOS 163 (H) 04/21/2023   LIPASE 44 11/01/2015    Electrolytes Lab Results  Component Value Date   NA 145 (H) 04/21/2023   K 3.8 04/21/2023   CL 103 04/21/2023   CALCIUM 9.4 04/21/2023   MG 2.2 08/27/2021    Bone Lab Results  Component Value Date   VD25OH 24.4 (L) 07/22/2022    Inflammation (CRP: Acute Phase) (ESR: Chronic Phase) Lab Results  Component Value Date   CRP 3 08/05/2022   ESRSEDRATE 6 08/05/2022   LATICACIDVEN 1.7 02/26/2020         Note: Above Lab results reviewed.  Recent Imaging Review  DG Pelvis 1-2 Views CLINICAL DATA:  Hip pain for a month.  EXAM: PELVIS - 1 VIEW  COMPARISON:  None Available.  FINDINGS: There is bilateral hip degenerative change with joint space narrowing and small osteophytes. Pelvic ring is intact. No acute fracture, dislocation or subluxation. No osteolytic or osteoblastic lesions.  IMPRESSION: Bilateral hip degenerative changes.  No acute osseous abnormalities.  Electronically Signed   By: Layla Maw M.D.   On: 02/16/2023  08:33 Note: Reviewed        Physical Exam  General appearance: Well nourished, well developed, and well hydrated. In no apparent acute distress Mental status: Alert, oriented x 3 (person, place, & time)       Respiratory: No evidence of acute respiratory distress Eyes: PERLA Vitals: There were no vitals taken for this visit. BMI: Estimated body mass index is 35.67 kg/m as calculated from the following:   Height as of 07/12/23: 5\' 4"  (1.626 m).   Weight as of 07/12/23: 207 lb 12.8 oz (94.3 kg).  Ideal: Ideal body weight: 54.7 kg (120 lb 9.5 oz) Adjusted ideal body weight: 70.5 kg (155 lb 7.6 oz)  Assessment   Diagnosis Status  No diagnosis found. Controlled Controlled Controlled   Updated Problems: No problems updated.  Plan of Care  Problem-specific:  Assessment and Plan            Ms. Lonita Debes Cranshaw has a current medication list which includes the following [place]-term medication(s): alosetron, blood glucose monitoring suppl, desloratadine, duloxetine, furosemide, blood glucose test strips, lancet device, metoprolol succinate, mirtazapine, montelukast, pantoprazole, pregabalin, ropinirole, rosuvastatin, and valsartan.  Pharmacotherapy (Medications Ordered): No orders of the defined types were placed in this encounter.  Orders:  No orders of the defined types were placed in this encounter.  Follow-up plan:   No follow-ups on file.      Interventional Therapies  Risk Factors  Considerations:     Planned  Pending:   Diagnostic bilateral lumbar facet MBB #1 (NO STEROIDS)    Under consideration:   Diagnostic bilateral lumbar facet MBB #1 (NO STEROIDS)    Completed:   Therapeutic bilateral Qutenza treatment x1 (02/17/09/10)    Completed by other providers:   None reported   Therapeutic  Palliative (PRN) options:   None established      Recent Visits No visits were found meeting these conditions. Showing recent visits within past 90 days and meeting all other  requirements Future Appointments Date Type Provider Dept  07/14/23 Appointment Delano Metz, MD [facility]  Showing future appointments within next 90 days and meeting all other requirements  I discussed the assessment and treatment plan with the patient. The patient was provided an opportunity to ask questions and all were answered. The patient agreed with the plan and demonstrated an understanding of the instructions.  Patient advised to call back or seek an in-person evaluation if the symptoms or condition worsens.  Duration of encounter: *** minutes.  Total time on encounter, as per AMA guidelines included both the face-to-face and non-face-to-face time personally spent by the physician and/or other qualified health care professional(s) on the day of the encounter (includes time in activities that require the physician or other qualified health care professional and does not include time in activities normally performed by clinical staff). Physician's time may include the following activities when performed: Preparing to see the patient (e.g., pre-charting review of records, searching for previously ordered imaging, lab work, and nerve conduction tests) Review of prior analgesic pharmacotherapies. Reviewing PMP Interpreting ordered tests (e.g., lab work, imaging, nerve conduction tests) Performing post-procedure evaluations, including interpretation of diagnostic procedures Obtaining and/or reviewing separately obtained history Performing a medically appropriate examination and/or evaluation Counseling and educating the patient/family/caregiver Ordering medications, tests, or procedures Referring and communicating with other health care professionals (when not separately reported) Documenting clinical information in the electronic or other health record Independently interpreting results (not separately reported) and communicating results to the patient/  family/caregiver Care coordination (not separately reported)  Note by: Oswaldo Done, MD Date: 07/14/2023; Time: 8:43 AM

## 2023-07-14 ENCOUNTER — Ambulatory Visit (HOSPITAL_BASED_OUTPATIENT_CLINIC_OR_DEPARTMENT_OTHER): Payer: Medicare Other | Admitting: Pain Medicine

## 2023-07-14 DIAGNOSIS — G8929 Other chronic pain: Secondary | ICD-10-CM

## 2023-07-14 DIAGNOSIS — Z91199 Patient's noncompliance with other medical treatment and regimen due to unspecified reason: Secondary | ICD-10-CM

## 2023-07-14 DIAGNOSIS — E119 Type 2 diabetes mellitus without complications: Secondary | ICD-10-CM | POA: Insufficient documentation

## 2023-07-14 DIAGNOSIS — M4310 Spondylolisthesis, site unspecified: Secondary | ICD-10-CM | POA: Insufficient documentation

## 2023-07-15 ENCOUNTER — Ambulatory Visit

## 2023-07-21 LAB — HM DIABETES EYE EXAM

## 2023-07-27 ENCOUNTER — Ambulatory Visit

## 2023-07-29 ENCOUNTER — Telehealth: Payer: Self-pay | Admitting: Nurse Practitioner

## 2023-07-29 ENCOUNTER — Other Ambulatory Visit: Payer: Self-pay | Admitting: Family Medicine

## 2023-07-29 DIAGNOSIS — J3089 Other allergic rhinitis: Secondary | ICD-10-CM

## 2023-07-29 DIAGNOSIS — G2581 Restless legs syndrome: Secondary | ICD-10-CM

## 2023-07-29 DIAGNOSIS — J301 Allergic rhinitis due to pollen: Secondary | ICD-10-CM

## 2023-07-29 DIAGNOSIS — I10 Essential (primary) hypertension: Secondary | ICD-10-CM

## 2023-07-29 NOTE — Telephone Encounter (Signed)
 Patient lvm regarding ozempic. I left her vm letting her know we have not received here @ office. We will call her when delivered-Toni

## 2023-08-05 ENCOUNTER — Telehealth: Payer: Self-pay

## 2023-08-05 NOTE — Telephone Encounter (Signed)
 Pt advised that we received ozempic and ready for pickup

## 2023-08-09 DIAGNOSIS — S92514A Nondisplaced fracture of proximal phalanx of right lesser toe(s), initial encounter for closed fracture: Secondary | ICD-10-CM | POA: Diagnosis not present

## 2023-08-09 DIAGNOSIS — S9031XA Contusion of right foot, initial encounter: Secondary | ICD-10-CM | POA: Diagnosis not present

## 2023-08-10 ENCOUNTER — Ambulatory Visit: Payer: Medicare Other | Admitting: Family Medicine

## 2023-08-11 DIAGNOSIS — Z96652 Presence of left artificial knee joint: Secondary | ICD-10-CM | POA: Diagnosis not present

## 2023-08-11 DIAGNOSIS — M1712 Unilateral primary osteoarthritis, left knee: Secondary | ICD-10-CM | POA: Diagnosis not present

## 2023-08-16 ENCOUNTER — Encounter: Payer: Self-pay | Admitting: Nurse Practitioner

## 2023-08-18 ENCOUNTER — Encounter: Payer: Self-pay | Admitting: Nurse Practitioner

## 2023-08-18 ENCOUNTER — Ambulatory Visit (INDEPENDENT_AMBULATORY_CARE_PROVIDER_SITE_OTHER): Admitting: Nurse Practitioner

## 2023-08-18 VITALS — BP 138/82 | HR 81 | Temp 98.1°F | Resp 16 | Ht 64.0 in | Wt 213.6 lb

## 2023-08-18 DIAGNOSIS — M150 Primary generalized (osteo)arthritis: Secondary | ICD-10-CM | POA: Diagnosis not present

## 2023-08-18 DIAGNOSIS — G894 Chronic pain syndrome: Secondary | ICD-10-CM | POA: Diagnosis not present

## 2023-08-18 DIAGNOSIS — J301 Allergic rhinitis due to pollen: Secondary | ICD-10-CM | POA: Diagnosis not present

## 2023-08-18 DIAGNOSIS — I10 Essential (primary) hypertension: Secondary | ICD-10-CM | POA: Diagnosis not present

## 2023-08-18 MED ORDER — OXYCODONE HCL 5 MG PO TABS: 5.0000 mg | ORAL_TABLET | Freq: Every day | ORAL | 0 refills | Status: DC | PRN

## 2023-08-18 MED ORDER — TRIAMCINOLONE ACETONIDE 55 MCG/ACT NA AERO: 2.0000 | INHALATION_SPRAY | Freq: Every day | NASAL | 6 refills | Status: AC

## 2023-08-18 NOTE — Progress Notes (Signed)
 [facility] [address] [place], [place] [address]  Internal MEDICINE  Office Visit Note  Patient Name: Wendy Hubbard  [id number]  [id number]  Date of Service: 08/18/2023  Chief Complaint  Patient presents with   Depression   Diabetes   Gastroesophageal Reflux   Hyperlipidemia   Follow-up    HPI Maresa presents for a follow-up visit for hypertension, chronic pain and allergic rhinitis.  Allergic rhinitis -- taking clarinex and singulair but allergies are really worsening due to high pollen count. Not currently using a steroid nasal spray which may help.  Hypertension -- blood pressure slightly elevated, improved when rechecked Chronic pain -- prn oxycodone is adequate     Current Medication: Outpatient Encounter Medications as of 08/18/2023  Medication Sig   alosetron (LOTRONEX) 1 MG tablet Take 1 tablet (1 mg total) by mouth 2 (two) times daily.   ALPRAZolam (XANAX) 0.5 MG tablet TAKE 1/2 TO 1 TABLET BY MOUTH ONCE DAILYAS NEEDED FOR ACUTE ANXIETY   Blood Glucose Monitoring Suppl DEVI 1 each by Does not apply route in the morning, at noon, and at bedtime. May substitute to any manufacturer covered by patient's insurance.   cilostazol (PLETAL) 100 MG tablet Take 1 tablet (100 mg total) by mouth 2 (two) times daily.   desloratadine (CLARINEX) 5 MG tablet TAKE 1 TABLET BY MOUTH ONCE DAILY   DULoxetine (CYMBALTA) 20 MG capsule Take 1 capsule (20 mg total) by mouth daily. (Patient taking differently: Take 20 mg by mouth every evening.)   furosemide (LASIX) 20 MG tablet Take 2 tablets (40 mg total) by mouth daily.   Glucose Blood (BLOOD GLUCOSE TEST STRIPS) STRP 1 each by In Vitro route daily. May substitute to any manufacturer covered by patient's insurance.   Lancet Device MISC 1 each by Does not apply route daily. May substitute to any manufacturer covered by patient's insurance.   Lancets Misc. MISC 1 each by Does not apply route daily. May substitute to any manufacturer  covered by patient's insurance.   loperamide (IMODIUM) 2 MG capsule Take 4 mg po once. Repeat with 2mg  po after each loose stool. Max dose is 16 mg/day   methocarbamol (ROBAXIN) 500 MG tablet Take 2 tablets (1,000 mg total) by mouth every 6 (six) hours as needed for muscle spasms.   methylPREDNISolone (MEDROL DOSEPAK) 4 MG TBPK tablet Take by mouth.   metoprolol succinate (TOPROL-XL) 50 MG 24 hr tablet Take 1 tablet (50 mg total) by mouth every evening.   mirtazapine (REMERON) 15 MG tablet TAKE 1 TABLET BY MOUTH AT BEDTIME   montelukast (SINGULAIR) 10 MG tablet TAKE 1 TABLET BY MOUTH AT BEDTIME   naloxone (NARCAN) nasal spray 4 mg/0.1 mL Spray the contents of 1 device into 1 nostril. Call 911. May repeat with 2nd device in alternate nostril if no response in 2-3 minutes.   pantoprazole (PROTONIX) 40 MG tablet TAKE 1 TABLET BY MOUTH ONCE DAILY   pregabalin (LYRICA) 50 MG capsule TAKE 1 CAPSULE BY MOUTH TWICE DAILY   rOPINIRole (REQUIP) 2 MG tablet TAKE 1 TABLET BY MOUTH AT BEDTIME   rosuvastatin (CRESTOR) 10 MG tablet Take 1 tablet (10 mg total) by mouth daily. (Patient taking differently: Take 10 mg by mouth at bedtime.)   Semaglutide,0.25 or 0.5MG /DOS, (OZEMPIC, 0.25 OR 0.5 MG/DOSE,) 2 MG/1.5ML SOPN Inject 0.25 mg subq qwk for 4 wk then inject 0.5 mg subq qwk x4 wk   Specialty Vitamins Products (HAIR BOOSTER PO) Take 2 capsules by mouth  daily. Moerie Ultimate Hair Boost Supplement (hyaluronic acid/vitamin b7/vitamin b9/vitamin b12/vitamin b6/zinc/selenium/copper/vitamin e)   triamcinolone (NASACORT) 55 MCG/ACT AERO nasal inhaler Place 2 sprays into the nose daily.   valsartan (DIOVAN) 80 MG tablet Take 1 tablet (80 mg total) by mouth daily.   [DISCONTINUED] oxyCODONE (OXY IR/ROXICODONE) 5 MG immediate release tablet Take 1-2 tablets (5-10 mg total) by mouth daily as needed for moderate pain (pain score 4-6) or severe pain (pain score 7-10).   [DISCONTINUED] oxyCODONE (OXY IR/ROXICODONE) 5 MG  immediate release tablet Take by mouth.   oxyCODONE (OXY IR/ROXICODONE) 5 MG immediate release tablet Take 1-2 tablets (5-10 mg total) by mouth daily as needed for moderate pain (pain score 4-6) or severe pain (pain score 7-10).   [START ON 09/15/2023] oxyCODONE (OXY IR/ROXICODONE) 5 MG immediate release tablet Take 1-2 tablets (5-10 mg total) by mouth daily as needed for moderate pain (pain score 4-6) or severe pain (pain score 7-10).   [START ON 10/13/2023] oxyCODONE (OXY IR/ROXICODONE) 5 MG immediate release tablet Take 1-2 tablets (5-10 mg total) by mouth daily as needed for moderate pain (pain score 4-6) or severe pain (pain score 7-10).   No facility-administered encounter medications on file as of 08/18/2023.    Surgical History: Past Surgical History:  Procedure Laterality Date   ABDOMINAL HYSTERECTOMY     ANKLE ARTHROSCOPY Bilateral    APPENDECTOMY     BLADDER SUSPENSION     BREAST EXCISIONAL BIOPSY Left 1990   neg   BREAST EXCISIONAL BIOPSY Right 1989   neg   BREAST SURGERY     CHOLECYSTECTOMY     COLONOSCOPY N/A 11/04/2015   Procedure: COLONOSCOPY;  Surgeon: Scot Jun, MD;  Location: [facility] ENDOSCOPY;  Service: Endoscopy;  Laterality: N/A;   COLONOSCOPY WITH PROPOFOL N/A 08/30/2020   Procedure: COLONOSCOPY WITH PROPOFOL;  Surgeon: Midge Minium, MD;  Location: [facility] SURGERY CNTR;  Service: Endoscopy;  Laterality: N/A;   ESOPHAGOGASTRODUODENOSCOPY N/A 11/02/2015   Procedure: ESOPHAGOGASTRODUODENOSCOPY (EGD);  Surgeon: Scot Jun, MD;  Location: [facility] ENDOSCOPY;  Service: Endoscopy;  Laterality: N/A;   ESOPHAGOGASTRODUODENOSCOPY (EGD) WITH PROPOFOL N/A 03/16/2022   Procedure: ESOPHAGOGASTRODUODENOSCOPY (EGD) WITH PROPOFOL foregin body removal;  Surgeon: Midge Minium, MD;  Location: [facility];  Service: Endoscopy;  Laterality: N/A;  Diabetic   GASTROJEJUNOSTOMY N/A 08/25/2021   Procedure: LAPAROSCOPIC REDO OF STOMACH TO INTESTINE;  Surgeon: Sheliah Hatch, De Blanch, MD;   Location: [facility] ORS;  Service: General;  Laterality: N/A;   KNEE ARTHROSCOPY Left    PARTIAL GASTRECTOMY N/A 08/25/2021   Procedure: PARTIAL GASTRECTOMY;  Surgeon: Sheliah Hatch De Blanch, MD;  Location: [facility] ORS;  Service: General;  Laterality: N/A;   POLYPECTOMY  08/30/2020   Procedure: POLYPECTOMY;  Surgeon: Midge Minium, MD;  Location: [facility]GERY [facility];  Service: Endoscopy;;   REDUCTION MAMMAPLASTY Bilateral 1988   TONSILLECTOMY     TOTAL KNEE ARTHROPLASTY Left 02/15/2023   Procedure: TOTAL KNEE ARTHROPLASTY;  Surgeon: Dannielle Huh, MD;  Location: [facility] ORS;  Service: Orthopedics;  Laterality: Left;   UPPER GI ENDOSCOPY  08/25/2021   Procedure: UPPER GI ENDOSCOPY;  Surgeon: Sheliah Hatch, De Blanch, MD;  Location: [facility] ORS;  Service: General;;    Medical History: Past Medical History:  Diagnosis Date   Allergy    Phreesia 07/23/2020   Anxiety    Phreesia 07/23/2020   COPD (chronic obstructive pulmonary disease) (HCC)    DDD (degenerative disc disease), thoracic    Depression    Phreesia 07/23/2020   Diabetes mellitus without  complication (HCC)    type 2   GERD (gastroesophageal reflux disease)    Hyperlipidemia    Nausea and vomiting 03/04/2022   Neuromuscular disorder (HCC)    severe neuropathy feet   Pneumonia    Rapid heart rate    Sleep apnea    no longer uses CPAP due to weight loss   TMJ (temporomandibular joint disorder) 11/28/2022   Wears contact lenses     Family History: Family History  Problem Relation Age of Onset   Hypertension Other    Bladder Cancer Mother    High Cholesterol Mother    High blood pressure Mother    Cancer Mother    Heart attack Father    High blood pressure Father    Kidney cancer Neg Hx    Prostate cancer Neg Hx    Breast cancer Neg Hx     Social History   Socioeconomic History   Marital status: Married    Spouse name: Not on file   Number of children: Not on file   Years of education: Not on file   Highest education level: Not on  file  Occupational History   Not on file  Tobacco Use   Smoking status: Former    Current packs/day: 0.00    Types: Cigarettes    Start date: 33    Quit date: 1996    Years since quitting: 29.2    Passive exposure: Past   Smokeless tobacco: Never   Tobacco comments:    quit in 1996  Vaping Use   Vaping status: Never Used  Substance and Sexual Activity   Alcohol use: Yes    Alcohol/week: 2.0 standard drinks of alcohol    Types: 1 Glasses of wine, 1 Standard drinks or equivalent per week    Comment: occasional  social   Drug use: No   Sexual activity: Yes    Partners: Male    Birth control/protection: Surgical, Other-see comments  Other Topics Concern   Not on file  Social History Narrative   Not on file   Social Drivers of Health   Financial Resource Strain: Low Risk  (02/02/2023)   Overall Financial Resource Strain (CARDIA)    Difficulty of Paying Living Expenses: Not hard at all  Food Insecurity: No Food Insecurity (02/15/2023)   Hunger Vital Sign    Worried About Running Out of Food in the Last Year: Never true    Ran Out of Food in the Last Year: Never true  Transportation Needs: No Transportation Needs (02/15/2023)   PRAPARE - Administrator, Civil Service (Medical): No    Lack of Transportation (Non-Medical): No  Physical Activity: Inactive (02/02/2023)   Exercise Vital Sign    Days of Exercise per Week: 0 days    Minutes of Exercise per Session: 0 min  Stress: No Stress Concern Present (02/02/2023)   [employer] of Occupational Health - Occupational Stress Questionnaire    Feeling of Stress : Not at all  Social Connections: Socially Integrated (02/02/2023)   Social Connection and Isolation Panel [NHANES]    Frequency of Communication with Friends and Family: More than three times a week    Frequency of Social Gatherings with Friends and Family: More than three times a week    Attends Religious Services: More than 4 times per year    Active  Member of [employer] or Organizations: Yes    Attends Banker Meetings: More than 4 times per year  Marital Status: Married  Catering manager Violence: Not At Risk (02/15/2023)   Humiliation, Afraid, Rape, and Kick questionnaire    Fear of Current or Ex-Partner: No    Emotionally Abused: No    Physically Abused: No    Sexually Abused: No      Review of Systems  Constitutional:  Negative for chills, fatigue and unexpected weight change.  HENT:  Positive for postnasal drip. Negative for congestion, rhinorrhea, sneezing and sore throat.   Eyes:  Negative for redness.  Respiratory: Negative.  Negative for cough, chest tightness, shortness of breath and wheezing.   Cardiovascular: Negative.  Negative for chest pain and palpitations.  Gastrointestinal:  Negative for abdominal pain, constipation, diarrhea, nausea and vomiting.  Genitourinary:  Negative for dysuria and frequency.  Musculoskeletal:  Negative for arthralgias, back pain, joint swelling and neck pain.  Skin:  Negative for rash.  Neurological: Negative.  Negative for tremors and numbness.  Hematological:  Negative for adenopathy. Does not bruise/bleed easily.  Psychiatric/Behavioral:  Negative for behavioral problems (Depression), sleep disturbance and suicidal ideas. The patient is not nervous/anxious.     Vital Signs: BP 138/82 Comment: 140/84  Pulse 81   Temp 98.1 F (36.7 C)   Resp 16   Ht 5\' 4"  (1.626 m)   Wt 213 lb 9.6 oz (96.9 kg)   SpO2 98%   BMI 36.66 kg/m    Physical Exam Vitals reviewed.  Constitutional:      General: She is not in acute distress.    Appearance: Normal appearance. She is obese. She is not ill-appearing.  HENT:     Head: Normocephalic and atraumatic.  Eyes:     Pupils: Pupils are equal, round, and reactive to light.  Cardiovascular:     Rate and Rhythm: Normal rate and regular rhythm.  Pulmonary:     Effort: Pulmonary effort is normal. No respiratory distress.  Skin:     Capillary Refill: Capillary refill takes less than 2 seconds.  Neurological:     Mental Status: She is alert and oriented to person, place, and time.  Psychiatric:        Mood and Affect: Mood normal.        Behavior: Behavior normal.        Assessment/Plan: 1. Seasonal allergic rhinitis due to pollen (Primary) Start nasocort nasal spray 1 spray into each nare once daily - triamcinolone (NASACORT) 55 MCG/ACT AERO nasal inhaler; Place 2 sprays into the nose daily.  Dispense: 1 each; Refill: 6  2. Essential hypertension Stable, continue metoprolol, furosemide and valsartan as prescribed.   3. Chronic pain syndrome Continue prn oxycodone as prescribed. Follow up in 3 months for additional refills. UDS due in 6 months  - oxyCODONE (OXY IR/ROXICODONE) 5 MG immediate release tablet; Take 1-2 tablets (5-10 mg total) by mouth daily as needed for moderate pain (pain score 4-6) or severe pain (pain score 7-10).  Dispense: 30 tablet; Refill: 0 - oxyCODONE (OXY IR/ROXICODONE) 5 MG immediate release tablet; Take 1-2 tablets (5-10 mg total) by mouth daily as needed for moderate pain (pain score 4-6) or severe pain (pain score 7-10).  Dispense: 30 tablet; Refill: 0 - oxyCODONE (OXY IR/ROXICODONE) 5 MG immediate release tablet; Take 1-2 tablets (5-10 mg total) by mouth daily as needed for moderate pain (pain score 4-6) or severe pain (pain score 7-10).  Dispense: 30 tablet; Refill: 0  4. Primary osteoarthritis involving multiple joints Continue prn oxycodone as prescribed. Follow up in 3 months for additional refills.  UDS due in 6 months.  - oxyCODONE (OXY IR/ROXICODONE) 5 MG immediate release tablet; Take 1-2 tablets (5-10 mg total) by mouth daily as needed for moderate pain (pain score 4-6) or severe pain (pain score 7-10).  Dispense: 30 tablet; Refill: 0 - oxyCODONE (OXY IR/ROXICODONE) 5 MG immediate release tablet; Take 1-2 tablets (5-10 mg total) by mouth daily as needed for moderate pain (pain  score 4-6) or severe pain (pain score 7-10).  Dispense: 30 tablet; Refill: 0 - oxyCODONE (OXY IR/ROXICODONE) 5 MG immediate release tablet; Take 1-2 tablets (5-10 mg total) by mouth daily as needed for moderate pain (pain score 4-6) or severe pain (pain score 7-10).  Dispense: 30 tablet; Refill: 0   General Counseling: Emaline verbalizes understanding of the findings of todays visit and agrees with plan of treatment. I have discussed any further diagnostic evaluation that may be needed or ordered today. We also reviewed her medications today. she has been encouraged to call the office with any questions or concerns that should arise related to todays visit.    No orders of the defined types were placed in this encounter.   Meds ordered this encounter  Medications   oxyCODONE (OXY IR/ROXICODONE) 5 MG immediate release tablet    Sig: Take 1-2 tablets (5-10 mg total) by mouth daily as needed for moderate pain (pain score 4-6) or severe pain (pain score 7-10).    Dispense:  30 tablet    Refill:  0    Chronic prescription, Fill for april   oxyCODONE (OXY IR/ROXICODONE) 5 MG immediate release tablet    Sig: Take 1-2 tablets (5-10 mg total) by mouth daily as needed for moderate pain (pain score 4-6) or severe pain (pain score 7-10).    Dispense:  30 tablet    Refill:  0    Chronic prescription, Fill for may   oxyCODONE (OXY IR/ROXICODONE) 5 MG immediate release tablet    Sig: Take 1-2 tablets (5-10 mg total) by mouth daily as needed for moderate pain (pain score 4-6) or severe pain (pain score 7-10).    Dispense:  30 tablet    Refill:  0    Chronic prescription, Fill for june   triamcinolone (NASACORT) 55 MCG/ACT AERO nasal inhaler    Sig: Place 2 sprays into the nose daily.    Dispense:  1 each    Refill:  6    Fill script if covered by insurance, if not patient will get OTC    Return in about 12 weeks (around 11/10/2023) for F/U , pain med refill, Adelfo Diebel PCP.   Total time spent:30  Minutes Time spent includes review of chart, medications, test results, and follow up plan with the patient.   [place] Controlled Substance Database was reviewed by me.  This patient was seen by Sallyanne Kuster, FNP-C in collaboration with Dr. Beverely Risen as a part of collaborative care agreement.   Climmie Buelow R. Tedd Sias, MSN, FNP-C Internal medicine

## 2023-09-01 DIAGNOSIS — H04223 Epiphora due to insufficient drainage, bilateral lacrimal glands: Secondary | ICD-10-CM | POA: Diagnosis not present

## 2023-09-01 DIAGNOSIS — H16223 Keratoconjunctivitis sicca, not specified as Sjogren's, bilateral: Secondary | ICD-10-CM | POA: Diagnosis not present

## 2023-09-15 ENCOUNTER — Other Ambulatory Visit: Payer: Self-pay | Admitting: Family Medicine

## 2023-09-15 DIAGNOSIS — J301 Allergic rhinitis due to pollen: Secondary | ICD-10-CM

## 2023-09-15 DIAGNOSIS — G2581 Restless legs syndrome: Secondary | ICD-10-CM

## 2023-09-15 DIAGNOSIS — G629 Polyneuropathy, unspecified: Secondary | ICD-10-CM

## 2023-09-15 DIAGNOSIS — I10 Essential (primary) hypertension: Secondary | ICD-10-CM

## 2023-09-28 ENCOUNTER — Ambulatory Visit (INDEPENDENT_AMBULATORY_CARE_PROVIDER_SITE_OTHER): Admitting: Nurse Practitioner

## 2023-09-28 VITALS — BP 128/68 | HR 93 | Temp 98.6°F | Resp 16 | Ht 64.0 in | Wt 206.0 lb

## 2023-09-28 DIAGNOSIS — Z6835 Body mass index (BMI) 35.0-35.9, adult: Secondary | ICD-10-CM

## 2023-09-28 DIAGNOSIS — K316 Fistula of stomach and duodenum: Secondary | ICD-10-CM | POA: Diagnosis not present

## 2023-09-28 DIAGNOSIS — E1142 Type 2 diabetes mellitus with diabetic polyneuropathy: Secondary | ICD-10-CM

## 2023-09-28 DIAGNOSIS — R1012 Left upper quadrant pain: Secondary | ICD-10-CM | POA: Diagnosis not present

## 2023-09-28 DIAGNOSIS — E66812 Obesity, class 2: Secondary | ICD-10-CM

## 2023-09-28 NOTE — Progress Notes (Signed)
 [facility] [address] [place], [place] [address]  Internal MEDICINE  Office Visit Note  Patient Name: Wendy Hubbard  [id number]  [id number]  Date of Service: 09/28/2023  Chief Complaint  Patient presents with   Acute Visit    Hole in stomach    HPI Wendy Hubbard presents for a follow-up visit for abdominal pain and possible perforation in GI tract.  History of gastric bypass sx in 2017.  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 After the surgery she had an upper endoscopy and the GI doctor stated that he removed a foreign object She is now having severe left upper quadrant pain that comes and goes that feels like the same pain she had when the hole in the stomach and fistula was found the first time.  Not losing weight , on 1 mg ozempic , incresae to 2 mg    Current Medication: Outpatient Encounter Medications as of 09/28/2023  Medication Sig   alosetron  (LOTRONEX ) 1 MG tablet Take 1 tablet (1 mg total) by mouth 2 (two) times daily.   ALPRAZolam  (XANAX ) 0.5 MG tablet TAKE 1/2 TO 1 TABLET BY MOUTH ONCE DAILYAS NEEDED FOR ACUTE ANXIETY   Blood Glucose Monitoring Suppl DEVI 1 each by Does not apply route in the morning, at noon, and at bedtime. May substitute to any manufacturer covered by patient's insurance.   cilostazol  (PLETAL ) 100 MG tablet Take 1 tablet (100 mg total) by mouth 2 (two) times daily.   desloratadine  (CLARINEX ) 5 MG tablet TAKE 1 TABLET BY MOUTH ONCE DAILY   DULoxetine  (CYMBALTA ) 20 MG capsule Take 1 capsule (20 mg total) by mouth daily. (Patient taking differently: Take 20 mg by mouth every evening.)   furosemide  (LASIX ) 20 MG tablet Take 2 tablets (40 mg total) by mouth daily.   Glucose Blood (BLOOD GLUCOSE TEST STRIPS) STRP 1 each by In Vitro route daily. May substitute to any manufacturer covered by patient's insurance.   Lancet Device MISC 1 each by Does not apply route daily. May substitute to any  manufacturer covered by patient's insurance.   Lancets Misc. MISC 1 each by Does not apply route daily. May substitute to any manufacturer covered by patient's insurance.   loperamide  (IMODIUM ) 2 MG capsule Take 4 mg po once. Repeat with 2mg  po after each loose stool. Max dose is 16 mg/day   methocarbamol  (ROBAXIN ) 500 MG tablet Take 2 tablets (1,000 mg total) by mouth every 6 (six) hours as needed for muscle spasms.   methylPREDNISolone  (MEDROL  DOSEPAK) 4 MG TBPK tablet Take by mouth.   mirtazapine  (REMERON ) 15 MG tablet TAKE 1 TABLET BY MOUTH AT BEDTIME   montelukast  (SINGULAIR ) 10 MG tablet TAKE 1 TABLET BY MOUTH AT BEDTIME   naloxone (NARCAN) nasal spray 4 mg/0.1 mL Spray the contents of 1 device into 1 nostril. Call 911. May repeat with 2nd device in alternate nostril if no response in 2-3 minutes.   oxyCODONE  (OXY IR/ROXICODONE ) 5 MG immediate release tablet Take 1-2 tablets (5-10 mg total) by mouth daily as needed for moderate pain (pain score 4-6) or severe pain (pain score 7-10).   oxyCODONE  (OXY IR/ROXICODONE ) 5 MG immediate release tablet Take 1-2 tablets (5-10 mg total) by mouth daily as needed for moderate pain (pain score 4-6) or severe pain (pain score 7-10).   oxyCODONE  (OXY IR/ROXICODONE ) 5 MG immediate release tablet Take 1-2 tablets (5-10 mg total) by mouth daily as needed for  moderate pain (pain score 4-6) or severe pain (pain score 7-10).   pantoprazole  (PROTONIX ) 40 MG tablet TAKE 1 TABLET BY MOUTH ONCE DAILY   pregabalin  (LYRICA ) 50 MG capsule TAKE 1 CAPSULE BY MOUTH TWICE DAILY   rOPINIRole  (REQUIP ) 2 MG tablet TAKE 1 TABLET BY MOUTH AT BEDTIME   Semaglutide ,0.25 or 0.5MG /DOS, (OZEMPIC , 0.25 OR 0.5 MG/DOSE,) 2 MG/1.5ML SOPN Inject 0.25 mg subq qwk for 4 wk then inject 0.5 mg subq qwk x4 wk   Specialty Vitamins Products (HAIR BOOSTER PO) Take 2 capsules by mouth daily. Moerie Ultimate Hair Boost Supplement (hyaluronic acid/vitamin b7/vitamin b9/vitamin b12/vitamin  b6/zinc /selenium/copper /vitamin e)   triamcinolone  (NASACORT ) 55 MCG/ACT AERO nasal inhaler Place 2 sprays into the nose daily.   [DISCONTINUED] metoprolol  succinate (TOPROL -XL) 50 MG 24 hr tablet Take 1 tablet (50 mg total) by mouth every evening.   [DISCONTINUED] rosuvastatin  (CRESTOR ) 10 MG tablet Take 1 tablet (10 mg total) by mouth daily. (Patient taking differently: Take 10 mg by mouth at bedtime.)   [DISCONTINUED] valsartan  (DIOVAN ) 80 MG tablet Take 1 tablet (80 mg total) by mouth daily.   No facility-administered encounter medications on file as of 09/28/2023.    Surgical History: Past Surgical History:  Procedure Laterality Date   ABDOMINAL HYSTERECTOMY     ANKLE ARTHROSCOPY Bilateral    APPENDECTOMY     BLADDER SUSPENSION     BREAST EXCISIONAL BIOPSY Left 1990   neg   BREAST EXCISIONAL BIOPSY Right 1989   neg   BREAST SURGERY     CHOLECYSTECTOMY     COLONOSCOPY N/A 11/04/2015   Procedure: COLONOSCOPY;  Surgeon: Cassie Click, MD;  Location: [facility] ENDOSCOPY;  Service: Endoscopy;  Laterality: N/A;   COLONOSCOPY WITH PROPOFOL  N/A 08/30/2020   Procedure: COLONOSCOPY WITH PROPOFOL ;  Surgeon: Marnee Sink, MD;  Location: [facility];  Service: Endoscopy;  Laterality: N/A;   ESOPHAGOGASTRODUODENOSCOPY N/A 11/02/2015   Procedure: ESOPHAGOGASTRODUODENOSCOPY (EGD);  Surgeon: Cassie Click, MD;  Location: [facility] ENDOSCOPY;  Service: Endoscopy;  Laterality: N/A;   ESOPHAGOGASTRODUODENOSCOPY (EGD) WITH PROPOFOL  N/A 03/16/2022   Procedure: ESOPHAGOGASTRODUODENOSCOPY (EGD) WITH PROPOFOL  foregin body removal;  Surgeon: Marnee Sink, MD;  Location: [facility];  Service: Endoscopy;  Laterality: N/A;  Diabetic   GASTROJEJUNOSTOMY N/A 08/25/2021   Procedure: LAPAROSCOPIC REDO OF STOMACH TO INTESTINE;  Surgeon: Dorrie Gaudier, Alphonso Aschoff, MD;  Location: [facility] ORS;  Service: General;  Laterality: N/A;   KNEE ARTHROSCOPY Left    PARTIAL GASTRECTOMY N/A 08/25/2021   Procedure: PARTIAL  GASTRECTOMY;  Surgeon: Dorrie Gaudier Alphonso Aschoff, MD;  Location: [facility];  Service: General;  Laterality: N/A;   POLYPECTOMY  08/30/2020   Procedure: POLYPECTOMY;  Surgeon: Marnee Sink, MD;  Location: [facility];  Service: Endoscopy;;   REDUCTION MAMMAPLASTY Bilateral 1988   TONSILLECTOMY     TOTAL KNEE ARTHROPLASTY Left 02/15/2023   Procedure: TOTAL KNEE ARTHROPLASTY;  Surgeon: Christie Cox, MD;  Location: [facility];  Service: Orthopedics;  Laterality: Left;   UPPER GI ENDOSCOPY  08/25/2021   Procedure: UPPER GI ENDOSCOPY;  Surgeon: Dorrie Gaudier, Alphonso Aschoff, MD;  Location: [facility];  Service: General;;    Medical History: Past Medical History:  Diagnosis Date   Allergy    Phreesia 07/23/2020   Anxiety    Phreesia 07/23/2020   COPD (chronic obstructive pulmonary disease) (HCC)    DDD (degenerative disc disease), thoracic    Depression    Phreesia 07/23/2020   Diabetes mellitus without complication (HCC)    type 2  GERD (gastroesophageal reflux disease)    Hyperlipidemia    Nausea and vomiting 03/04/2022   Neuromuscular disorder (HCC)    severe neuropathy feet   Pneumonia    Rapid heart rate    Sleep apnea    no longer uses CPAP due to weight loss   TMJ (temporomandibular joint disorder) 11/28/2022   Wears contact lenses     Family History: Family History  Problem Relation Age of Onset   Hypertension Other    Bladder Cancer Mother    High Cholesterol Mother    High blood pressure Mother    Cancer Mother    Heart attack Father    High blood pressure Father    Kidney cancer Neg Hx    Prostate cancer Neg Hx    Breast cancer Neg Hx     Social History   Socioeconomic History   Marital status: Married    Spouse name: Not on file   Number of children: Not on file   Years of education: Not on file   Highest education level: Not on file  Occupational History   Not on file  Tobacco Use   Smoking status: Former    Current packs/day: 0.00    Types: Cigarettes    Start  date: 66    Quit date: 1996    Years since quitting: 29.4    Passive exposure: Past   Smokeless tobacco: Never   Tobacco comments:    quit in 1996  Vaping Use   Vaping status: Never Used  Substance and Sexual Activity   Alcohol use: Yes    Alcohol/week: 2.0 standard drinks of alcohol    Types: 1 Glasses of wine, 1 Standard drinks or equivalent per week    Comment: occasional  social   Drug use: No   Sexual activity: Yes    Partners: Male    Birth control/protection: Surgical, Other-see comments  Other Topics Concern   Not on file  Social History Narrative   Not on file   Social Drivers of Health   Financial Resource Strain: Low Risk  (02/02/2023)   Overall Financial Resource Strain (CARDIA)    Difficulty of Paying Living Expenses: Not hard at all  Food Insecurity: No Food Insecurity (02/15/2023)   Hunger Vital Sign    Worried About Running Out of Food in the Last Year: Never true    Ran Out of Food in the Last Year: Never true  Transportation Needs: No Transportation Needs (02/15/2023)   PRAPARE - Administrator, Civil Service (Medical): No    Lack of Transportation (Non-Medical): No  Physical Activity: Inactive (02/02/2023)   Exercise Vital Sign    Days of Exercise per Week: 0 days    Minutes of Exercise per Session: 0 min  Stress: No Stress Concern Present (02/02/2023)   [employer] of Occupational Health - Occupational Stress Questionnaire    Feeling of Stress : Not at all  Social Connections: Socially Integrated (02/02/2023)   Social Connection and Isolation Panel    Frequency of Communication with Friends and Family: More than three times a week    Frequency of Social Gatherings with Friends and Family: More than three times a week    Attends Religious Services: More than 4 times per year    Active Member of [employer] or Organizations: Yes    Attends Engineer, structural: More than 4 times per year    Marital Status: Married  Catering manager  Violence: Not At  Risk (02/15/2023)   Humiliation, Afraid, Rape, and Kick questionnaire    Fear of Current or Ex-Partner: No    Emotionally Abused: No    Physically Abused: No    Sexually Abused: No      Review of Systems  Constitutional:  Positive for fatigue and unexpected weight change. Negative for chills.  HENT:  Positive for postnasal drip. Negative for congestion, rhinorrhea, sneezing and sore throat.   Eyes:  Negative for redness.  Respiratory: Negative.  Negative for cough, chest tightness, shortness of breath and wheezing.   Cardiovascular: Negative.  Negative for chest pain and palpitations.  Gastrointestinal:  Positive for abdominal distention, abdominal pain and nausea. Negative for constipation, diarrhea and vomiting.  Genitourinary:  Negative for dysuria and frequency.  Musculoskeletal:  Positive for arthralgias. Negative for back pain, joint swelling and neck pain.  Skin:  Negative for rash.  Neurological: Negative.  Negative for tremors and numbness.  Hematological:  Negative for adenopathy. Does not bruise/bleed easily.  Psychiatric/Behavioral:  Negative for behavioral problems (Depression), sleep disturbance and suicidal ideas. The patient is not nervous/anxious.     Vital Signs: BP 128/68   Pulse 93   Temp 98.6 F (37 C)   Resp 16   Ht 5' 4 (1.626 m)   Wt 206 lb (93.4 kg)   SpO2 97%   BMI 35.36 kg/m    Physical Exam Vitals reviewed.  Constitutional:      General: She is not in acute distress.    Appearance: Normal appearance. She is obese. She is not ill-appearing.  HENT:     Head: Normocephalic and atraumatic.   Eyes:     Pupils: Pupils are equal, round, and reactive to light.    Cardiovascular:     Rate and Rhythm: Normal rate and regular rhythm.  Pulmonary:     Effort: Pulmonary effort is normal. No respiratory distress.  Abdominal:     General: Bowel sounds are increased. There is distension.     Tenderness: There is abdominal tenderness  in the left upper quadrant.     Hernia: A hernia is present. Hernia is present in the ventral area.   Skin:    Capillary Refill: Capillary refill takes less than 2 seconds.   Neurological:     Mental Status: She is alert and oriented to person, place, and time.   Psychiatric:        Mood and Affect: Mood normal.        Behavior: Behavior normal.        Assessment/Plan: 1. LUQ abdominal pain (Primary) Referred to general surgery  - Ambulatory referral to General Surgery  2. Gastric fistula Referred to general surgery, the same surgeon who fixed the fistula last time. - Ambulatory referral to General Surgery  3. Type 2 diabetes mellitus with peripheral neuropathy (HCC) Will increase ozempic  dose to 2 mg weekly  4. Class 2 severe obesity due to excess calories with serious comorbidity and body mass index (BMI) of 35.0 to 35.9 in adult (HCC) Will increase ozempic  dose to 2 mg weekly   General Counseling: Vivan verbalizes understanding of the findings of todays visit and agrees with plan of treatment. I have discussed any further diagnostic evaluation that may be needed or ordered today. We also reviewed her medications today. she has been encouraged to call the office with any questions or concerns that should arise related to todays visit.    Orders Placed This Encounter  Procedures   Ambulatory referral to General  Surgery    No orders of the defined types were placed in this encounter.   Return if symptoms worsen or fail to improve, for and keep regular scheduled follow up with Tahja Liao pcp.   Total time spent:30 Minutes Time spent includes review of chart, medications, test results, and follow up plan with the patient.   [place] Controlled Substance Database was reviewed by me.  This patient was seen by Laurence Pons, FNP-C in collaboration with Dr. Verneta Gone as a part of collaborative care agreement.   Elisa Sorlie R. Bobbi Burow, MSN, FNP-C Internal medicine

## 2023-09-29 ENCOUNTER — Telehealth: Payer: Self-pay | Admitting: Nurse Practitioner

## 2023-09-29 NOTE — Telephone Encounter (Signed)
 Awaiting 09/28/23 office notes for General Surgery referral-Toni

## 2023-09-29 NOTE — Telephone Encounter (Signed)
 Lvm & sent message to confirm referral to [place]-Toni

## 2023-09-30 ENCOUNTER — Other Ambulatory Visit: Payer: Self-pay | Admitting: Nurse Practitioner

## 2023-09-30 ENCOUNTER — Other Ambulatory Visit: Payer: Self-pay | Admitting: Family Medicine

## 2023-09-30 DIAGNOSIS — G894 Chronic pain syndrome: Secondary | ICD-10-CM

## 2023-09-30 DIAGNOSIS — E1169 Type 2 diabetes mellitus with other specified complication: Secondary | ICD-10-CM

## 2023-09-30 DIAGNOSIS — I10 Essential (primary) hypertension: Secondary | ICD-10-CM

## 2023-09-30 DIAGNOSIS — R002 Palpitations: Secondary | ICD-10-CM

## 2023-09-30 DIAGNOSIS — M150 Primary generalized (osteo)arthritis: Secondary | ICD-10-CM

## 2023-10-11 DIAGNOSIS — M1712 Unilateral primary osteoarthritis, left knee: Secondary | ICD-10-CM | POA: Diagnosis not present

## 2023-10-11 DIAGNOSIS — Z96652 Presence of left artificial knee joint: Secondary | ICD-10-CM | POA: Diagnosis not present

## 2023-10-21 ENCOUNTER — Encounter: Payer: Self-pay | Admitting: Nurse Practitioner

## 2023-10-21 ENCOUNTER — Telehealth: Payer: Self-pay | Admitting: Nurse Practitioner

## 2023-10-21 ENCOUNTER — Other Ambulatory Visit: Payer: Self-pay

## 2023-10-21 MED ORDER — METHOCARBAMOL 500 MG PO TABS: 1000.0000 mg | ORAL_TABLET | Freq: Four times a day (QID) | ORAL | 0 refills | Status: DC | PRN

## 2023-10-21 NOTE — Telephone Encounter (Signed)
 General Surgery appointment 11/19/2023 @ [facility]-Toni

## 2023-10-21 NOTE — Telephone Encounter (Signed)
 Called patient to let her know I have sent her General Surgery referral to Dr. Dorrie Gaudier w/ [facility]. She stated she already had appointment with them. She will call me when she gets to work to give me appt date-Toni

## 2023-11-08 ENCOUNTER — Ambulatory Visit (INDEPENDENT_AMBULATORY_CARE_PROVIDER_SITE_OTHER): Admitting: Nurse Practitioner

## 2023-11-08 ENCOUNTER — Encounter: Payer: Self-pay | Admitting: Nurse Practitioner

## 2023-11-08 VITALS — BP 134/68 | HR 100 | Temp 97.5°F | Resp 16 | Ht 64.0 in | Wt 206.0 lb

## 2023-11-08 DIAGNOSIS — E1169 Type 2 diabetes mellitus with other specified complication: Secondary | ICD-10-CM | POA: Diagnosis not present

## 2023-11-08 DIAGNOSIS — M150 Primary generalized (osteo)arthritis: Secondary | ICD-10-CM

## 2023-11-08 DIAGNOSIS — M792 Neuralgia and neuritis, unspecified: Secondary | ICD-10-CM | POA: Diagnosis not present

## 2023-11-08 DIAGNOSIS — E785 Hyperlipidemia, unspecified: Secondary | ICD-10-CM

## 2023-11-08 DIAGNOSIS — F411 Generalized anxiety disorder: Secondary | ICD-10-CM

## 2023-11-08 DIAGNOSIS — E1159 Type 2 diabetes mellitus with other circulatory complications: Secondary | ICD-10-CM

## 2023-11-08 DIAGNOSIS — E1142 Type 2 diabetes mellitus with diabetic polyneuropathy: Secondary | ICD-10-CM | POA: Diagnosis not present

## 2023-11-08 DIAGNOSIS — G894 Chronic pain syndrome: Secondary | ICD-10-CM

## 2023-11-08 DIAGNOSIS — I152 Hypertension secondary to endocrine disorders: Secondary | ICD-10-CM

## 2023-11-08 DIAGNOSIS — Z79899 Other long term (current) drug therapy: Secondary | ICD-10-CM

## 2023-11-08 LAB — POCT GLYCOSYLATED HEMOGLOBIN (HGB A1C): Hemoglobin A1C: 5.2 % (ref 4.0–5.6)

## 2023-11-08 MED ORDER — VALSARTAN 80 MG PO TABS
80.0000 mg | ORAL_TABLET | Freq: Every day | ORAL | 3 refills | Status: AC
Start: 2023-11-08 — End: ?

## 2023-11-08 MED ORDER — DESLORATADINE 5 MG PO TABS: 5.0000 mg | ORAL_TABLET | Freq: Every day | ORAL | 1 refills | Status: AC

## 2023-11-08 MED ORDER — ROSUVASTATIN CALCIUM 10 MG PO TABS: 10.0000 mg | ORAL_TABLET | Freq: Every day | ORAL | 1 refills | Status: AC

## 2023-11-08 MED ORDER — MIRTAZAPINE 15 MG PO TABS: 15.0000 mg | ORAL_TABLET | Freq: Every day | ORAL | 1 refills | Status: DC

## 2023-11-08 MED ORDER — METOPROLOL SUCCINATE ER 50 MG PO TB24
50.0000 mg | ORAL_TABLET | Freq: Every day | ORAL | 1 refills | Status: AC
Start: 2023-11-08 — End: ?

## 2023-11-08 MED ORDER — MONTELUKAST SODIUM 10 MG PO TABS: 10.0000 mg | ORAL_TABLET | Freq: Every day | ORAL | 3 refills | Status: AC

## 2023-11-08 MED ORDER — OXYCODONE HCL 5 MG PO TABS: 5.0000 mg | ORAL_TABLET | Freq: Every day | ORAL | 0 refills | Status: DC | PRN

## 2023-11-08 MED ORDER — CILOSTAZOL 100 MG PO TABS: 100.0000 mg | ORAL_TABLET | Freq: Two times a day (BID) | ORAL | 3 refills | Status: AC

## 2023-11-08 MED ORDER — DULOXETINE HCL 20 MG PO CPEP: 20.0000 mg | ORAL_CAPSULE | Freq: Every day | ORAL | 1 refills | Status: DC

## 2023-11-08 MED ORDER — PREGABALIN 50 MG PO CAPS: 50.0000 mg | ORAL_CAPSULE | Freq: Two times a day (BID) | ORAL | 0 refills | Status: DC

## 2023-11-08 MED ORDER — ROPINIROLE HCL 2 MG PO TABS: 2.0000 mg | ORAL_TABLET | Freq: Every day | ORAL | 3 refills | Status: AC

## 2023-11-08 MED ORDER — PANTOPRAZOLE SODIUM 40 MG PO TBEC: DELAYED_RELEASE_TABLET | ORAL | 3 refills | Status: AC

## 2023-11-08 NOTE — Progress Notes (Signed)
 [facility] [address] [place], [place] [address]  Internal MEDICINE  Office Visit Note  Patient Name: Wendy Hubbard  [id number]  [id number]  Date of Service: 11/08/2023  Chief Complaint  Patient presents with   Depression   Diabetes   Gastroesophageal Reflux   Hyperlipidemia   Follow-up    HPI Wendy Hubbard presents for a follow-up visit for diabetes, weight loss, medication refills and chronic pain.  Diabetes -- A1c is significantly improved to normal range. Wants to increase ozempic  dose to help facilitate additional weight loss.  Weight loss -- taking ozempic  1 mg weekly.  Diabetic eye exam was done in march  Due for several refills of medications Chronic osteoarthritis pain of multiple joints. -- takes oxycodone  as needed.  Dexa scan ordered, not scheduled yet.    Current Medication: Outpatient Encounter Medications as of 11/08/2023  Medication Sig   alosetron  (LOTRONEX ) 1 MG tablet Take 1 tablet (1 mg total) by mouth 2 (two) times daily.   ALPRAZolam  (XANAX ) 0.5 MG tablet TAKE 1/2 TO 1 TABLET BY MOUTH ONCE DAILYAS NEEDED FOR ACUTE ANXIETY   Blood Glucose Monitoring Suppl DEVI 1 each by Does not apply route in the morning, at noon, and at bedtime. May substitute to any manufacturer covered by patient's insurance.   furosemide  (LASIX ) 20 MG tablet Take 2 tablets (40 mg total) by mouth daily.   Glucose Blood (BLOOD GLUCOSE TEST STRIPS) STRP 1 each by In Vitro route daily. May substitute to any manufacturer covered by patient's insurance.   Lancet Device MISC 1 each by Does not apply route daily. May substitute to any manufacturer covered by patient's insurance.   Lancets Misc. MISC 1 each by Does not apply route daily. May substitute to any manufacturer covered by patient's insurance.   loperamide  (IMODIUM ) 2 MG capsule Take 4 mg po once. Repeat with 2mg  po after each loose stool. Max dose is 16 mg/day   methocarbamol  (ROBAXIN ) 500 MG tablet Take 2 tablets (1,000 mg  total) by mouth every 6 (six) hours as needed for muscle spasms.   naloxone (NARCAN) nasal spray 4 mg/0.1 mL Spray the contents of 1 device into 1 nostril. Call 911. May repeat with 2nd device in alternate nostril if no response in 2-3 minutes.   Semaglutide ,0.25 or 0.5MG /DOS, (OZEMPIC , 0.25 OR 0.5 MG/DOSE,) 2 MG/1.5ML SOPN Inject 0.25 mg subq qwk for 4 wk then inject 0.5 mg subq qwk x4 wk   Specialty Vitamins Products (HAIR BOOSTER PO) Take 2 capsules by mouth daily. Moerie Ultimate Hair Boost Supplement (hyaluronic acid/vitamin b7/vitamin b9/vitamin b12/vitamin b6/zinc /selenium/copper /vitamin e)   triamcinolone  (NASACORT ) 55 MCG/ACT AERO nasal inhaler Place 2 sprays into the nose daily.   [DISCONTINUED] cilostazol  (PLETAL ) 100 MG tablet Take 1 tablet (100 mg total) by mouth 2 (two) times daily.   [DISCONTINUED] desloratadine  (CLARINEX ) 5 MG tablet TAKE 1 TABLET BY MOUTH ONCE DAILY   [DISCONTINUED] DULoxetine  (CYMBALTA ) 20 MG capsule Take 1 capsule (20 mg total) by mouth daily. (Patient taking differently: Take 20 mg by mouth every evening.)   [DISCONTINUED] metoprolol  succinate (TOPROL -XL) 50 MG 24 hr tablet TAKE 1 TABLET BY MOUTH ONCE DAILY   [DISCONTINUED] mirtazapine  (REMERON ) 15 MG tablet TAKE 1 TABLET BY MOUTH AT BEDTIME   [DISCONTINUED] montelukast  (SINGULAIR ) 10 MG tablet TAKE 1 TABLET BY MOUTH AT BEDTIME   [DISCONTINUED] oxyCODONE  (OXY IR/ROXICODONE ) 5 MG immediate release tablet Take 1-2 tablets (5-10 mg total) by mouth daily as needed for moderate pain (pain score 4-6) or  severe pain (pain score 7-10).   [DISCONTINUED] oxyCODONE  (OXY IR/ROXICODONE ) 5 MG immediate release tablet Take 1-2 tablets (5-10 mg total) by mouth daily as needed for moderate pain (pain score 4-6) or severe pain (pain score 7-10).   [DISCONTINUED] oxyCODONE  (OXY IR/ROXICODONE ) 5 MG immediate release tablet Take 1-2 tablets (5-10 mg total) by mouth daily as needed for moderate pain (pain score 4-6) or severe pain (pain  score 7-10).   [DISCONTINUED] pantoprazole  (PROTONIX ) 40 MG tablet TAKE 1 TABLET BY MOUTH ONCE DAILY   [DISCONTINUED] pregabalin  (LYRICA ) 50 MG capsule TAKE 1 CAPSULE BY MOUTH TWICE DAILY   [DISCONTINUED] rOPINIRole  (REQUIP ) 2 MG tablet TAKE 1 TABLET BY MOUTH AT BEDTIME   [DISCONTINUED] rosuvastatin  (CRESTOR ) 10 MG tablet TAKE 1 TABLET BY MOUTH ONCE EVERY EVENING   [DISCONTINUED] valsartan  (DIOVAN ) 80 MG tablet TAKE 1 TABLET BY MOUTH ONCE DAILY   cilostazol  (PLETAL ) 100 MG tablet Take 1 tablet (100 mg total) by mouth 2 (two) times daily.   desloratadine  (CLARINEX ) 5 MG tablet Take 1 tablet (5 mg total) by mouth daily.   DULoxetine  (CYMBALTA ) 20 MG capsule Take 1 capsule (20 mg total) by mouth daily.   metoprolol  succinate (TOPROL -XL) 50 MG 24 hr tablet Take 1 tablet (50 mg total) by mouth daily.   mirtazapine  (REMERON ) 15 MG tablet Take 1 tablet (15 mg total) by mouth at bedtime.   montelukast  (SINGULAIR ) 10 MG tablet Take 1 tablet (10 mg total) by mouth at bedtime.   [START ON 01/04/2024] oxyCODONE  (OXY IR/ROXICODONE ) 5 MG immediate release tablet Take 1-2 tablets (5-10 mg total) by mouth daily as needed for moderate pain (pain score 4-6) or severe pain (pain score 7-10).   [START ON 12/07/2023] oxyCODONE  (OXY IR/ROXICODONE ) 5 MG immediate release tablet Take 1-2 tablets (5-10 mg total) by mouth daily as needed for moderate pain (pain score 4-6) or severe pain (pain score 7-10).   [START ON 11/09/2023] oxyCODONE  (OXY IR/ROXICODONE ) 5 MG immediate release tablet Take 1-2 tablets (5-10 mg total) by mouth daily as needed for moderate pain (pain score 4-6) or severe pain (pain score 7-10).   pantoprazole  (PROTONIX ) 40 MG tablet TAKE 1 TABLET BY MOUTH ONCE DAILY   pregabalin  (LYRICA ) 50 MG capsule Take 1 capsule (50 mg total) by mouth 2 (two) times daily.   rOPINIRole  (REQUIP ) 2 MG tablet Take 1 tablet (2 mg total) by mouth at bedtime.   rosuvastatin  (CRESTOR ) 10 MG tablet Take 1 tablet (10 mg total) by  mouth daily.   valsartan  (DIOVAN ) 80 MG tablet Take 1 tablet (80 mg total) by mouth daily.   [DISCONTINUED] methylPREDNISolone  (MEDROL  DOSEPAK) 4 MG TBPK tablet Take by mouth.   No facility-administered encounter medications on file as of 11/08/2023.    Surgical History: Past Surgical History:  Procedure Laterality Date   ABDOMINAL HYSTERECTOMY     ANKLE ARTHROSCOPY Bilateral    APPENDECTOMY     BLADDER SUSPENSION     BREAST EXCISIONAL BIOPSY Left 1990   neg   BREAST EXCISIONAL BIOPSY Right 1989   neg   BREAST SURGERY     CHOLECYSTECTOMY     COLONOSCOPY N/A 11/04/2015   Procedure: COLONOSCOPY;  Surgeon: Lamar ONEIDA Holmes, MD;  Location: [facility] ENDOSCOPY;  Service: Endoscopy;  Laterality: N/A;   COLONOSCOPY WITH PROPOFOL  N/A 08/30/2020   Procedure: COLONOSCOPY WITH PROPOFOL ;  Surgeon: Jinny Carmine, MD;  Location: [facility];  Service: Endoscopy;  Laterality: N/A;   ESOPHAGOGASTRODUODENOSCOPY N/A 11/02/2015   Procedure: ESOPHAGOGASTRODUODENOSCOPY (EGD);  Surgeon: Lamar ONEIDA Holmes, MD;  Location: [facility] ENDOSCOPY;  Service: Endoscopy;  Laterality: N/A;   ESOPHAGOGASTRODUODENOSCOPY (EGD) WITH PROPOFOL  N/A 03/16/2022   Procedure: ESOPHAGOGASTRODUODENOSCOPY (EGD) WITH PROPOFOL  foregin body removal;  Surgeon: Jinny Carmine, MD;  Location: [facility];  Service: Endoscopy;  Laterality: N/A;  Diabetic   GASTROJEJUNOSTOMY N/A 08/25/2021   Procedure: LAPAROSCOPIC REDO OF STOMACH TO INTESTINE;  Surgeon: Stevie, Herlene Righter, MD;  Location: [facility] ORS;  Service: General;  Laterality: N/A;   KNEE ARTHROSCOPY Left    PARTIAL GASTRECTOMY N/A 08/25/2021   Procedure: PARTIAL GASTRECTOMY;  Surgeon: Stevie Herlene Righter, MD;  Location: [facility] ORS;  Service: General;  Laterality: N/A;   POLYPECTOMY  08/30/2020   Procedure: POLYPECTOMY;  Surgeon: Jinny Carmine, MD;  Location: [facility];  Service: Endoscopy;;   REDUCTION MAMMAPLASTY Bilateral 1988   TONSILLECTOMY     TOTAL KNEE ARTHROPLASTY Left  02/15/2023   Procedure: TOTAL KNEE ARTHROPLASTY;  Surgeon: Rubie Kemps, MD;  Location: [facility] ORS;  Service: Orthopedics;  Laterality: Left;   UPPER GI ENDOSCOPY  08/25/2021   Procedure: UPPER GI ENDOSCOPY;  Surgeon: Stevie, Herlene Righter, MD;  Location: [facility] ORS;  Service: General;;    Medical History: Past Medical History:  Diagnosis Date   Allergy    Phreesia 07/23/2020   Anxiety    Phreesia 07/23/2020   COPD (chronic obstructive pulmonary disease) (HCC)    DDD (degenerative disc disease), thoracic    Depression    Phreesia 07/23/2020   Diabetes mellitus without complication (HCC)    type 2   GERD (gastroesophageal reflux disease)    Hyperlipidemia    Nausea and vomiting 03/04/2022   Neuromuscular disorder (HCC)    severe neuropathy feet   Pneumonia    Rapid heart rate    Sleep apnea    no longer uses CPAP due to weight loss   TMJ (temporomandibular joint disorder) 11/28/2022   Wears contact lenses     Family History: Family History  Problem Relation Age of Onset   Hypertension Other    Bladder Cancer Mother    High Cholesterol Mother    High blood pressure Mother    Cancer Mother    Heart attack Father    High blood pressure Father    Kidney cancer Neg Hx    Prostate cancer Neg Hx    Breast cancer Neg Hx     Social History   Socioeconomic History   Marital status: Married    Spouse name: Not on file   Number of children: Not on file   Years of education: Not on file   Highest education level: Not on file  Occupational History   Not on file  Tobacco Use   Smoking status: Former    Current packs/day: 0.00    Types: Cigarettes    Start date: 39    Quit date: 1996    Years since quitting: 29.5    Passive exposure: Past   Smokeless tobacco: Never   Tobacco comments:    quit in 1996  Vaping Use   Vaping status: Never Used  Substance and Sexual Activity   Alcohol use: Yes    Alcohol/week: 2.0 standard drinks of alcohol    Types: 1 Glasses of wine, 1  Standard drinks or equivalent per week    Comment: occasional  social   Drug use: No   Sexual activity: Yes    Partners: Male    Birth control/protection: Surgical, Other-see comments  Other Topics Concern  Not on file  Social History Narrative   Not on file   Social Drivers of Health   Financial Resource Strain: Low Risk  (02/02/2023)   Overall Financial Resource Strain (CARDIA)    Difficulty of Paying Living Expenses: Not hard at all  Food Insecurity: No Food Insecurity (02/15/2023)   Hunger Vital Sign    Worried About Running Out of Food in the Last Year: Never true    Ran Out of Food in the Last Year: Never true  Transportation Needs: No Transportation Needs (02/15/2023)   PRAPARE - Administrator, Civil Service (Medical): No    Lack of Transportation (Non-Medical): No  Physical Activity: Inactive (02/02/2023)   Exercise Vital Sign    Days of Exercise per Week: 0 days    Minutes of Exercise per Session: 0 min  Stress: No Stress Concern Present (02/02/2023)   [employer] of Occupational Health - Occupational Stress Questionnaire    Feeling of Stress : Not at all  Social Connections: Socially Integrated (02/02/2023)   Social Connection and Isolation Panel    Frequency of Communication with Friends and Family: More than three times a week    Frequency of Social Gatherings with Friends and Family: More than three times a week    Attends Religious Services: More than 4 times per year    Active Member of [employer] or Organizations: Yes    Attends Engineer, structural: More than 4 times per year    Marital Status: Married  Catering manager Violence: Not At Risk (02/15/2023)   Humiliation, Afraid, Rape, and Kick questionnaire    Fear of Current or Ex-Partner: No    Emotionally Abused: No    Physically Abused: No    Sexually Abused: No      Review of Systems  Constitutional:  Negative for chills, fatigue and unexpected weight change.  HENT:  Positive for  postnasal drip. Negative for congestion, rhinorrhea, sneezing and sore throat.   Eyes:  Negative for redness.  Respiratory: Negative.  Negative for cough, chest tightness, shortness of breath and wheezing.   Cardiovascular: Negative.  Negative for chest pain and palpitations.  Gastrointestinal:  Negative for abdominal pain, constipation, diarrhea, nausea and vomiting.  Genitourinary:  Negative for dysuria and frequency.  Musculoskeletal:  Negative for arthralgias, back pain, joint swelling and neck pain.  Skin:  Negative for rash.  Neurological: Negative.  Negative for tremors and numbness.  Hematological:  Negative for adenopathy. Does not bruise/bleed easily.  Psychiatric/Behavioral:  Negative for behavioral problems (Depression), sleep disturbance and suicidal ideas. The patient is not nervous/anxious.     Vital Signs: BP 134/68   Pulse 100   Temp (!) 97.5 F (36.4 C)   Resp 16   Ht 5' 4 (1.626 m)   Wt 206 lb (93.4 kg)   SpO2 97%   BMI 35.36 kg/m    Physical Exam Vitals reviewed.  Constitutional:      General: She is not in acute distress.    Appearance: Normal appearance. She is obese. She is not ill-appearing.  HENT:     Head: Normocephalic and atraumatic.   Eyes:     Pupils: Pupils are equal, round, and reactive to light.    Cardiovascular:     Rate and Rhythm: Normal rate and regular rhythm.  Pulmonary:     Effort: Pulmonary effort is normal. No respiratory distress.   Skin:    Capillary Refill: Capillary refill takes less than 2 seconds.  Neurological:     Mental Status: She is alert and oriented to person, place, and time.   Psychiatric:        Mood and Affect: Mood normal.        Behavior: Behavior normal.        Assessment/Plan: 1. Type 2 diabetes mellitus with peripheral neuropathy (HCC) (Primary) A1c is stable and in normal range. Urine sent for microalbumin/creatinine ratio.  - POCT glycosylated hemoglobin (Hb A1C) - Urine Microalbumin  w/creat. ratio  2. Hypertension associated with diabetes (HCC) Stable, continue metoprolol  and valsartan  as prescribed.  - metoprolol  succinate (TOPROL -XL) 50 MG 24 hr tablet; Take 1 tablet (50 mg total) by mouth daily.  Dispense: 90 tablet; Refill: 1 - valsartan  (DIOVAN ) 80 MG tablet; Take 1 tablet (80 mg total) by mouth daily.  Dispense: 90 tablet; Refill: 3  3. Hyperlipidemia associated with type 2 diabetes mellitus (HCC) Continue rosuvastatin  as prescribed.  - rosuvastatin  (CRESTOR ) 10 MG tablet; Take 1 tablet (10 mg total) by mouth daily.  Dispense: 90 tablet; Refill: 1  4. Peripheral neuropathic pain Continue pregabalin  and duloxetine  as prescribed.  - pregabalin  (LYRICA ) 50 MG capsule; Take 1 capsule (50 mg total) by mouth 2 (two) times daily.  Dispense: 180 capsule; Refill: 0 - DULoxetine  (CYMBALTA ) 20 MG capsule; Take 1 capsule (20 mg total) by mouth daily.  Dispense: 90 capsule; Refill: 1  5. Chronic pain syndrome Chronic pain, continue prn oxycodone  as prescribed, follow up in 3 months for additional refills.  - oxyCODONE  (OXY IR/ROXICODONE ) 5 MG immediate release tablet; Take 1-2 tablets (5-10 mg total) by mouth daily as needed for moderate pain (pain score 4-6) or severe pain (pain score 7-10).  Dispense: 30 tablet; Refill: 0 - oxyCODONE  (OXY IR/ROXICODONE ) 5 MG immediate release tablet; Take 1-2 tablets (5-10 mg total) by mouth daily as needed for moderate pain (pain score 4-6) or severe pain (pain score 7-10).  Dispense: 30 tablet; Refill: 0 - oxyCODONE  (OXY IR/ROXICODONE ) 5 MG immediate release tablet; Take 1-2 tablets (5-10 mg total) by mouth daily as needed for moderate pain (pain score 4-6) or severe pain (pain score 7-10).  Dispense: 30 tablet; Refill: 0  6. Primary osteoarthritis involving multiple joints Chronic pain, continue prn oxycodone  as prescribed, follow up in 3 months for additional refills.  - oxyCODONE  (OXY IR/ROXICODONE ) 5 MG immediate release tablet; Take  1-2 tablets (5-10 mg total) by mouth daily as needed for moderate pain (pain score 4-6) or severe pain (pain score 7-10).  Dispense: 30 tablet; Refill: 0 - oxyCODONE  (OXY IR/ROXICODONE ) 5 MG immediate release tablet; Take 1-2 tablets (5-10 mg total) by mouth daily as needed for moderate pain (pain score 4-6) or severe pain (pain score 7-10).  Dispense: 30 tablet; Refill: 0 - oxyCODONE  (OXY IR/ROXICODONE ) 5 MG immediate release tablet; Take 1-2 tablets (5-10 mg total) by mouth daily as needed for moderate pain (pain score 4-6) or severe pain (pain score 7-10).  Dispense: 30 tablet; Refill: 0  7. Encounter for medication review Medication list reviewed, updated and refills ordered  - desloratadine  (CLARINEX ) 5 MG tablet; Take 1 tablet (5 mg total) by mouth daily.  Dispense: 90 tablet; Refill: 1 - cilostazol  (PLETAL ) 100 MG tablet; Take 1 tablet (100 mg total) by mouth 2 (two) times daily.  Dispense: 180 tablet; Refill: 3 - metoprolol  succinate (TOPROL -XL) 50 MG 24 hr tablet; Take 1 tablet (50 mg total) by mouth daily.  Dispense: 90 tablet; Refill: 1 - montelukast  (SINGULAIR ) 10 MG tablet;  Take 1 tablet (10 mg total) by mouth at bedtime.  Dispense: 90 tablet; Refill: 3 - mirtazapine  (REMERON ) 15 MG tablet; Take 1 tablet (15 mg total) by mouth at bedtime.  Dispense: 90 tablet; Refill: 1 - rOPINIRole  (REQUIP ) 2 MG tablet; Take 1 tablet (2 mg total) by mouth at bedtime.  Dispense: 90 tablet; Refill: 3 - pantoprazole  (PROTONIX ) 40 MG tablet; TAKE 1 TABLET BY MOUTH ONCE DAILY  Dispense: 90 tablet; Refill: 3  8. Generalized anxiety disorder Contineu duloxetine  and mirtazapine  as prescribed.  - DULoxetine  (CYMBALTA ) 20 MG capsule; Take 1 capsule (20 mg total) by mouth daily.  Dispense: 90 capsule; Refill: 1 - mirtazapine  (REMERON ) 15 MG tablet; Take 1 tablet (15 mg total) by mouth at bedtime.  Dispense: 90 tablet; Refill: 1   General Counseling: Wendy Hubbard verbalizes understanding of the findings of todays  visit and agrees with plan of treatment. I have discussed any further diagnostic evaluation that may be needed or ordered today. We also reviewed her medications today. she has been encouraged to call the office with any questions or concerns that should arise related to todays visit.    Orders Placed This Encounter  Procedures   Urine Microalbumin w/creat. ratio   POCT glycosylated hemoglobin (Hb A1C)    Meds ordered this encounter  Medications   oxyCODONE  (OXY IR/ROXICODONE ) 5 MG immediate release tablet    Sig: Take 1-2 tablets (5-10 mg total) by mouth daily as needed for moderate pain (pain score 4-6) or severe pain (pain score 7-10).    Dispense:  30 tablet    Refill:  0    Chronic prescription, Fill for August 26   oxyCODONE  (OXY IR/ROXICODONE ) 5 MG immediate release tablet    Sig: Take 1-2 tablets (5-10 mg total) by mouth daily as needed for moderate pain (pain score 4-6) or severe pain (pain score 7-10).    Dispense:  30 tablet    Refill:  0    Chronic prescription, Fill for July 29   oxyCODONE  (OXY IR/ROXICODONE ) 5 MG immediate release tablet    Sig: Take 1-2 tablets (5-10 mg total) by mouth daily as needed for moderate pain (pain score 4-6) or severe pain (pain score 7-10).    Dispense:  30 tablet    Refill:  0    Chronic prescription, Fill for July 1   rosuvastatin  (CRESTOR ) 10 MG tablet    Sig: Take 1 tablet (10 mg total) by mouth daily.    Dispense:  90 tablet    Refill:  1    Please discontinue all other refills sent by other providers and fill this prescription instead for future refills   pregabalin  (LYRICA ) 50 MG capsule    Sig: Take 1 capsule (50 mg total) by mouth 2 (two) times daily.    Dispense:  180 capsule    Refill:  0   DULoxetine  (CYMBALTA ) 20 MG capsule    Sig: Take 1 capsule (20 mg total) by mouth daily.    Dispense:  90 capsule    Refill:  1   desloratadine  (CLARINEX ) 5 MG tablet    Sig: Take 1 tablet (5 mg total) by mouth daily.    Dispense:  90  tablet    Refill:  1   cilostazol  (PLETAL ) 100 MG tablet    Sig: Take 1 tablet (100 mg total) by mouth 2 (two) times daily.    Dispense:  180 tablet    Refill:  3   metoprolol  succinate (TOPROL -XL) 50  MG 24 hr tablet    Sig: Take 1 tablet (50 mg total) by mouth daily.    Dispense:  90 tablet    Refill:  1    Please discontinue all other refills sent by other providers and fill this prescription instead for future refills   montelukast  (SINGULAIR ) 10 MG tablet    Sig: Take 1 tablet (10 mg total) by mouth at bedtime.    Dispense:  90 tablet    Refill:  3    Please discontinue all other refills sent by other providers and fill this prescription instead for future refills   mirtazapine  (REMERON ) 15 MG tablet    Sig: Take 1 tablet (15 mg total) by mouth at bedtime.    Dispense:  90 tablet    Refill:  1   rOPINIRole  (REQUIP ) 2 MG tablet    Sig: Take 1 tablet (2 mg total) by mouth at bedtime.    Dispense:  90 tablet    Refill:  3    Please discontinue all other refills sent by other providers and fill this prescription instead for future refills   pantoprazole  (PROTONIX ) 40 MG tablet    Sig: TAKE 1 TABLET BY MOUTH ONCE DAILY    Dispense:  90 tablet    Refill:  3   valsartan  (DIOVAN ) 80 MG tablet    Sig: Take 1 tablet (80 mg total) by mouth daily.    Dispense:  90 tablet    Refill:  3    Please discontinue all other refills sent by other providers and fill this prescription instead for future refills    Return in about 3 months (around 02/01/2024) for F/U, pain med refill, Monterrius Cardosa PCP.   Total time spent:30 Minutes Time spent includes review of chart, medications, test results, and follow up plan with the patient.   [place] Controlled Substance Database was reviewed by me.  This patient was seen by Mardy Maxin, FNP-C in collaboration with Dr. Sigrid Bathe as a part of collaborative care agreement.   Analy Bassford R. Maxin, MSN, FNP-C Internal medicine

## 2023-11-09 ENCOUNTER — Encounter: Payer: Self-pay | Admitting: Nurse Practitioner

## 2023-11-09 LAB — MICROALBUMIN / CREATININE URINE RATIO
Creatinine, Urine: 244.6 mg/dL
Microalb/Creat Ratio: 8 mg/g{creat} (ref 0–29)
Microalbumin, Urine: 19 ug/mL

## 2023-11-10 DIAGNOSIS — M1712 Unilateral primary osteoarthritis, left knee: Secondary | ICD-10-CM | POA: Diagnosis not present

## 2023-11-10 DIAGNOSIS — Z96652 Presence of left artificial knee joint: Secondary | ICD-10-CM | POA: Diagnosis not present

## 2023-11-18 ENCOUNTER — Telehealth: Payer: Self-pay

## 2023-11-18 DIAGNOSIS — R1084 Generalized abdominal pain: Secondary | ICD-10-CM | POA: Diagnosis not present

## 2023-11-18 DIAGNOSIS — Z9884 Bariatric surgery status: Secondary | ICD-10-CM | POA: Diagnosis not present

## 2023-11-18 DIAGNOSIS — Z7952 Long term (current) use of systemic steroids: Secondary | ICD-10-CM | POA: Diagnosis not present

## 2023-11-18 DIAGNOSIS — I5042 Chronic combined systolic (congestive) and diastolic (congestive) heart failure: Secondary | ICD-10-CM | POA: Diagnosis not present

## 2023-11-18 DIAGNOSIS — E569 Vitamin deficiency, unspecified: Secondary | ICD-10-CM | POA: Diagnosis not present

## 2023-11-18 NOTE — Telephone Encounter (Signed)
 Lmom that we Ozempic  from patient assistance program ready for pickup

## 2023-11-19 ENCOUNTER — Other Ambulatory Visit: Payer: Self-pay | Admitting: General Surgery

## 2023-11-19 DIAGNOSIS — R1084 Generalized abdominal pain: Secondary | ICD-10-CM

## 2023-11-19 DIAGNOSIS — Z9884 Bariatric surgery status: Secondary | ICD-10-CM

## 2023-11-30 ENCOUNTER — Other Ambulatory Visit

## 2023-11-30 DIAGNOSIS — K760 Fatty (change of) liver, not elsewhere classified: Secondary | ICD-10-CM | POA: Diagnosis not present

## 2023-11-30 DIAGNOSIS — Z9884 Bariatric surgery status: Secondary | ICD-10-CM | POA: Diagnosis not present

## 2023-11-30 DIAGNOSIS — R1084 Generalized abdominal pain: Secondary | ICD-10-CM | POA: Diagnosis not present

## 2023-12-01 ENCOUNTER — Other Ambulatory Visit: Payer: Self-pay | Admitting: Nurse Practitioner

## 2023-12-01 DIAGNOSIS — M7061 Trochanteric bursitis, right hip: Secondary | ICD-10-CM | POA: Diagnosis not present

## 2023-12-01 DIAGNOSIS — M461 Sacroiliitis, not elsewhere classified: Secondary | ICD-10-CM | POA: Diagnosis not present

## 2023-12-01 DIAGNOSIS — M25511 Pain in right shoulder: Secondary | ICD-10-CM | POA: Diagnosis not present

## 2023-12-01 DIAGNOSIS — M792 Neuralgia and neuritis, unspecified: Secondary | ICD-10-CM

## 2023-12-28 ENCOUNTER — Telehealth: Payer: Self-pay

## 2024-01-03 NOTE — Telephone Encounter (Signed)
 As per pt she like to continue until her next appt

## 2024-01-04 ENCOUNTER — Other Ambulatory Visit: Payer: Self-pay | Admitting: Nurse Practitioner

## 2024-01-04 DIAGNOSIS — M792 Neuralgia and neuritis, unspecified: Secondary | ICD-10-CM

## 2024-01-11 ENCOUNTER — Encounter: Payer: Self-pay | Admitting: Sports Medicine

## 2024-01-11 DIAGNOSIS — I5042 Chronic combined systolic (congestive) and diastolic (congestive) heart failure: Secondary | ICD-10-CM | POA: Diagnosis not present

## 2024-01-11 DIAGNOSIS — R1084 Generalized abdominal pain: Secondary | ICD-10-CM | POA: Diagnosis not present

## 2024-01-11 DIAGNOSIS — E569 Vitamin deficiency, unspecified: Secondary | ICD-10-CM | POA: Diagnosis not present

## 2024-01-11 DIAGNOSIS — Z9884 Bariatric surgery status: Secondary | ICD-10-CM | POA: Diagnosis not present

## 2024-01-19 DIAGNOSIS — Y999 Unspecified external cause status: Secondary | ICD-10-CM | POA: Diagnosis not present

## 2024-01-19 DIAGNOSIS — M6751 Plica syndrome, right knee: Secondary | ICD-10-CM | POA: Diagnosis not present

## 2024-01-19 DIAGNOSIS — S83231A Complex tear of medial meniscus, current injury, right knee, initial encounter: Secondary | ICD-10-CM | POA: Diagnosis not present

## 2024-01-19 DIAGNOSIS — M65961 Unspecified synovitis and tenosynovitis, right lower leg: Secondary | ICD-10-CM | POA: Diagnosis not present

## 2024-01-19 DIAGNOSIS — G8918 Other acute postprocedural pain: Secondary | ICD-10-CM | POA: Diagnosis not present

## 2024-01-19 DIAGNOSIS — X58XXXA Exposure to other specified factors, initial encounter: Secondary | ICD-10-CM | POA: Diagnosis not present

## 2024-01-19 DIAGNOSIS — M2241 Chondromalacia patellae, right knee: Secondary | ICD-10-CM | POA: Diagnosis not present

## 2024-01-25 DIAGNOSIS — M25561 Pain in right knee: Secondary | ICD-10-CM | POA: Diagnosis not present

## 2024-01-25 DIAGNOSIS — Z9889 Other specified postprocedural states: Secondary | ICD-10-CM | POA: Diagnosis not present

## 2024-02-01 ENCOUNTER — Ambulatory Visit: Admitting: Nurse Practitioner

## 2024-02-03 DIAGNOSIS — E569 Vitamin deficiency, unspecified: Secondary | ICD-10-CM | POA: Diagnosis not present

## 2024-02-03 DIAGNOSIS — Z9884 Bariatric surgery status: Secondary | ICD-10-CM | POA: Diagnosis not present

## 2024-02-03 DIAGNOSIS — K5712 Diverticulitis of small intestine without perforation or abscess without bleeding: Secondary | ICD-10-CM | POA: Diagnosis not present

## 2024-02-13 ENCOUNTER — Other Ambulatory Visit: Payer: Self-pay | Admitting: Nurse Practitioner

## 2024-02-13 DIAGNOSIS — E559 Vitamin D deficiency, unspecified: Secondary | ICD-10-CM

## 2024-02-13 DIAGNOSIS — E1169 Type 2 diabetes mellitus with other specified complication: Secondary | ICD-10-CM

## 2024-02-13 DIAGNOSIS — E1142 Type 2 diabetes mellitus with diabetic polyneuropathy: Secondary | ICD-10-CM

## 2024-02-13 DIAGNOSIS — E538 Deficiency of other specified B group vitamins: Secondary | ICD-10-CM

## 2024-02-13 DIAGNOSIS — G2581 Restless legs syndrome: Secondary | ICD-10-CM

## 2024-02-14 ENCOUNTER — Other Ambulatory Visit: Payer: Self-pay | Admitting: Nurse Practitioner

## 2024-02-14 DIAGNOSIS — G894 Chronic pain syndrome: Secondary | ICD-10-CM

## 2024-02-14 DIAGNOSIS — M150 Primary generalized (osteo)arthritis: Secondary | ICD-10-CM

## 2024-02-14 DIAGNOSIS — S40862A Insect bite (nonvenomous) of left upper arm, initial encounter: Secondary | ICD-10-CM | POA: Diagnosis not present

## 2024-02-18 ENCOUNTER — Other Ambulatory Visit: Payer: Self-pay | Admitting: Family Medicine

## 2024-02-18 ENCOUNTER — Other Ambulatory Visit: Payer: Self-pay | Admitting: Nurse Practitioner

## 2024-02-18 DIAGNOSIS — E785 Hyperlipidemia, unspecified: Secondary | ICD-10-CM | POA: Diagnosis not present

## 2024-02-18 DIAGNOSIS — E538 Deficiency of other specified B group vitamins: Secondary | ICD-10-CM | POA: Diagnosis not present

## 2024-02-18 DIAGNOSIS — E559 Vitamin D deficiency, unspecified: Secondary | ICD-10-CM | POA: Diagnosis not present

## 2024-02-18 DIAGNOSIS — E1142 Type 2 diabetes mellitus with diabetic polyneuropathy: Secondary | ICD-10-CM | POA: Diagnosis not present

## 2024-02-18 DIAGNOSIS — F411 Generalized anxiety disorder: Secondary | ICD-10-CM

## 2024-02-18 DIAGNOSIS — E1169 Type 2 diabetes mellitus with other specified complication: Secondary | ICD-10-CM | POA: Diagnosis not present

## 2024-02-18 DIAGNOSIS — M792 Neuralgia and neuritis, unspecified: Secondary | ICD-10-CM

## 2024-02-19 LAB — CMP14+EGFR
ALT: 10 IU/L (ref 0–32)
AST: 15 IU/L (ref 0–40)
Albumin: 4 g/dL (ref 3.9–4.9)
Alkaline Phosphatase: 145 IU/L — ABNORMAL HIGH (ref 49–135)
BUN/Creatinine Ratio: 13 (ref 12–28)
BUN: 12 mg/dL (ref 8–27)
Bilirubin Total: 0.3 mg/dL (ref 0.0–1.2)
CO2: 25 mmol/L (ref 20–29)
Calcium: 9.6 mg/dL (ref 8.7–10.3)
Chloride: 105 mmol/L (ref 96–106)
Creatinine, Ser: 0.9 mg/dL (ref 0.57–1.00)
Globulin, Total: 2.5 g/dL (ref 1.5–4.5)
Glucose: 95 mg/dL (ref 70–99)
Potassium: 4.3 mmol/L (ref 3.5–5.2)
Sodium: 145 mmol/L — ABNORMAL HIGH (ref 134–144)
Total Protein: 6.5 g/dL (ref 6.0–8.5)
eGFR: 70 mL/min/1.73 (ref >59)

## 2024-02-19 LAB — CBC WITH DIFFERENTIAL/PLATELET
Basophils Absolute: 0.1 x10E3/uL (ref 0.0–0.2)
Basos: 1 %
EOS (ABSOLUTE): 0.2 x10E3/uL (ref 0.0–0.4)
Eos: 2 %
Hematocrit: 41.4 % (ref 34.0–46.6)
Hemoglobin: 13.4 g/dL (ref 11.1–15.9)
Immature Grans (Abs): 0 x10E3/uL (ref 0.0–0.1)
Immature Granulocytes: 0 %
Lymphocytes Absolute: 3.3 x10E3/uL — ABNORMAL HIGH (ref 0.7–3.1)
Lymphs: 29 %
MCH: 31.2 pg (ref 26.6–33.0)
MCHC: 32.4 g/dL (ref 31.5–35.7)
MCV: 96 fL (ref 79–97)
Monocytes Absolute: 0.6 x10E3/uL (ref 0.1–0.9)
Monocytes: 5 %
Neutrophils Absolute: 7.3 x10E3/uL — ABNORMAL HIGH (ref 1.4–7.0)
Neutrophils: 63 %
Platelets: 436 x10E3/uL (ref 150–450)
RBC: 4.3 x10E6/uL (ref 3.77–5.28)
RDW: 11.7 % (ref 11.7–15.4)
WBC: 11.5 x10E3/uL — ABNORMAL HIGH (ref 3.4–10.8)

## 2024-02-19 LAB — LIPID PANEL
Chol/HDL Ratio: 2.5 ratio (ref 0.0–4.4)
Cholesterol, Total: 149 mg/dL (ref 100–199)
HDL: 59 mg/dL (ref >39)
LDL Chol Calc (NIH): 58 mg/dL (ref 0–99)
Triglycerides: 200 mg/dL — ABNORMAL HIGH (ref 0–149)
VLDL Cholesterol Cal: 32 mg/dL (ref 5–40)

## 2024-02-19 LAB — IRON,TIBC AND FERRITIN PANEL
Ferritin: 52 ng/mL (ref 15–150)
Iron Saturation: 25 % (ref 15–55)
Iron: 74 ug/dL (ref 27–139)
Total Iron Binding Capacity: 294 ug/dL (ref 250–450)
UIBC: 220 ug/dL (ref 118–369)

## 2024-02-19 LAB — HGB A1C W/O EAG: Hgb A1c MFr Bld: 5.8 % — ABNORMAL HIGH (ref 4.8–5.6)

## 2024-02-19 LAB — B12 AND FOLATE PANEL
Folate: >20 ng/mL (ref >3.0)
Vitamin B-12: 207 pg/mL — ABNORMAL LOW (ref 232–1245)

## 2024-02-19 LAB — VITAMIN D 25 HYDROXY (VIT D DEFICIENCY, FRACTURES): Vit D, 25-Hydroxy: 20.6 ng/mL — ABNORMAL LOW (ref 30.0–100.0)

## 2024-02-22 ENCOUNTER — Encounter: Payer: Self-pay | Admitting: Nurse Practitioner

## 2024-02-22 ENCOUNTER — Ambulatory Visit: Admitting: Nurse Practitioner

## 2024-02-22 ENCOUNTER — Telehealth: Payer: Self-pay | Admitting: Nurse Practitioner

## 2024-02-22 VITALS — BP 132/86 | HR 81 | Temp 97.6°F | Resp 16 | Ht 64.0 in | Wt 208.2 lb

## 2024-02-22 DIAGNOSIS — F411 Generalized anxiety disorder: Secondary | ICD-10-CM

## 2024-02-22 DIAGNOSIS — Z0001 Encounter for general adult medical examination with abnormal findings: Secondary | ICD-10-CM | POA: Diagnosis not present

## 2024-02-22 DIAGNOSIS — E2839 Other primary ovarian failure: Secondary | ICD-10-CM

## 2024-02-22 DIAGNOSIS — R600 Localized edema: Secondary | ICD-10-CM

## 2024-02-22 DIAGNOSIS — Z566 Other physical and mental strain related to work: Secondary | ICD-10-CM

## 2024-02-22 DIAGNOSIS — R6889 Other general symptoms and signs: Secondary | ICD-10-CM | POA: Diagnosis not present

## 2024-02-22 DIAGNOSIS — M150 Primary generalized (osteo)arthritis: Secondary | ICD-10-CM

## 2024-02-22 DIAGNOSIS — E559 Vitamin D deficiency, unspecified: Secondary | ICD-10-CM

## 2024-02-22 DIAGNOSIS — E538 Deficiency of other specified B group vitamins: Secondary | ICD-10-CM | POA: Diagnosis not present

## 2024-02-22 DIAGNOSIS — Z23 Encounter for immunization: Secondary | ICD-10-CM

## 2024-02-22 DIAGNOSIS — Z1231 Encounter for screening mammogram for malignant neoplasm of breast: Secondary | ICD-10-CM

## 2024-02-22 DIAGNOSIS — G894 Chronic pain syndrome: Secondary | ICD-10-CM

## 2024-02-22 DIAGNOSIS — M792 Neuralgia and neuritis, unspecified: Secondary | ICD-10-CM | POA: Diagnosis not present

## 2024-02-22 DIAGNOSIS — Z818 Family history of other mental and behavioral disorders: Secondary | ICD-10-CM

## 2024-02-22 DIAGNOSIS — F331 Major depressive disorder, recurrent, moderate: Secondary | ICD-10-CM

## 2024-02-22 MED ORDER — OXYCODONE HCL 5 MG PO TABS: 5.0000 mg | ORAL_TABLET | Freq: Every day | ORAL | 0 refills | Status: DC | PRN

## 2024-02-22 MED ORDER — CYANOCOBALAMIN 1000 MCG/ML IJ SOLN: INTRAMUSCULAR | 0 refills | Status: AC

## 2024-02-22 MED ORDER — TRAZODONE HCL 50 MG PO TABS: 25.0000 mg | ORAL_TABLET | Freq: Every day | ORAL | 3 refills | Status: DC

## 2024-02-22 MED ORDER — DULOXETINE HCL 30 MG PO CPEP: 30.0000 mg | ORAL_CAPSULE | Freq: Every day | ORAL | 3 refills | Status: DC

## 2024-02-22 MED ORDER — PREGABALIN 50 MG PO CAPS: 50.0000 mg | ORAL_CAPSULE | Freq: Two times a day (BID) | ORAL | 0 refills | Status: AC

## 2024-02-22 MED ORDER — SYRINGE 25G X 5/8" 3 ML MISC: 3 refills | Status: AC

## 2024-02-22 MED ORDER — VITAMIN D (ERGOCALCIFEROL) 1.25 MG (50000 UNIT) PO CAPS: 50000.0000 [IU] | ORAL_CAPSULE | ORAL | 1 refills | Status: DC

## 2024-02-22 NOTE — Progress Notes (Unsigned)
 [facility] [address] [place], [place] [address]  Internal MEDICINE  Office Visit Note  Patient Name: Wendy Hubbard  [id number]  [id number]  Date of Service: 02/22/2024  Chief Complaint  Patient presents with   Depression   Diabetes   Gastroesophageal Reflux   Hyperlipidemia   Medicare Wellness    HPI Wendy Hubbard presents for a medicare annual wellness visit.  Well-appearing 68 y.o. female with type 2 diabetes, neuropathy, chronic pain syndrome, anxiety and depression, history of gastric bypass surgery in 2017, and revision in 2023 Routine CRC screening: due in 2032 Routine mammogram: due now  DEXA scan: due now  Eye exam: sees eye doctor regular  Labs: routine labs reviewed with patient New or worsening pain: none  Other concerns: bilateral lower extremity edema, on lasix . If swelling does not improve, may need to see vascular surgery.      02/22/2024    8:27 AM  MMSE - Mini Mental State Exam  Orientation to time 0  Orientation to Place 5  Registration 3  Attention/ Calculation 5  Recall 3  Language- name 2 objects 2  Language- repeat 1  Language- follow 3 step command 3  Language- read & follow direction 1  Write a sentence 1  Copy design 1  Total score 25    Functional Status Survey: Is the patient deaf or have difficulty hearing?: No Does the patient have difficulty seeing, even when wearing glasses/contacts?: Yes Does the patient have difficulty concentrating, remembering, or making decisions?: Yes Does the patient have difficulty walking or climbing stairs?: Yes Does the patient have difficulty dressing or bathing?: No Does the patient have difficulty doing errands alone such as visiting a doctor's office or shopping?: No     12/15/2022    1:31 PM 01/26/2023   10:13 AM 02/02/2023   11:25 AM 07/12/2023    8:21 AM 02/22/2024    8:26 AM  Fall Risk  Falls in the past year? 0 1 1 1 1   Was there an injury with Fall?  1 1 1  0  Was there an injury  with Fall? - Comments   Fx: Left foot and coccyx area. Followed by medical attention    Fall Risk Category Calculator  3 2 3 1   Patient at Risk for Falls Due to  History of fall(s) No Fall Risks    Fall risk Follow up  Falls evaluation completed Falls prevention discussed Falls evaluation completed Falls evaluation completed       02/22/2024    8:58 AM  Depression screen PHQ 2/9  Decreased Interest 2  Down, Depressed, Hopeless 2  PHQ - 2 Score 4  Altered sleeping 2  Tired, decreased energy 3  Change in appetite 0  Feeling bad or failure about yourself  3  Trouble concentrating 3  Moving slowly or fidgety/restless 0  Suicidal thoughts 2  PHQ-9 Score 17  Difficult doing work/chores Somewhat difficult       02/22/2024    9:04 AM 04/27/2023   10:30 AM 01/26/2023   10:13 AM 04/29/2022    4:24 PM  GAD 7 : Generalized Anxiety Score  Nervous, Anxious, on Edge 2 0 1 1  Control/stop worrying 2 0 1 1  Worry too much - different things 2 0 1 1  Trouble relaxing 2 0 1 0  Restless 0 0 0 0  Easily annoyed or irritable 2 1 1 1   Afraid - awful might happen 2 0 0 0  Total GAD  7 Score 12 1 5 4   Anxiety Difficulty Somewhat difficult Somewhat difficult Somewhat difficult Not difficult at all      Current Medication: Outpatient Encounter Medications as of 02/22/2024  Medication Sig   cyanocobalamin  (VITAMIN B12) 1000 MCG/ML injection Per month ( pt needs syringes with needles as well ) self administered, administer 1 ml IM once a week x3 doses then decrease to monthly x5 more doses.   DULoxetine  (CYMBALTA ) 30 MG capsule Take 1 capsule (30 mg total) by mouth daily.   Syringe/Needle, Disp, (SYRINGE 3CC/25GX5/8) 25G X 5/8 3 ML MISC Please use 1 needle and syringe to draw up medication and then replace the needle with a new needle from a new syringe. For B12 injections to be done at home.   traZODone (DESYREL) 50 MG tablet Take 0.5-1 tablets (25-50 mg total) by mouth at bedtime.   Vitamin  D, Ergocalciferol , (DRISDOL) 1.25 MG (50000 UNIT) CAPS capsule Take 1 capsule (50,000 Units total) by mouth every 7 (seven) days.   alosetron  (LOTRONEX ) 1 MG tablet Take 1 tablet (1 mg total) by mouth 2 (two) times daily.   Blood Glucose Monitoring Suppl DEVI 1 each by Does not apply route in the morning, at noon, and at bedtime. May substitute to any manufacturer covered by patient's insurance.   cilostazol  (PLETAL ) 100 MG tablet Take 1 tablet (100 mg total) by mouth 2 (two) times daily.   desloratadine  (CLARINEX ) 5 MG tablet Take 1 tablet (5 mg total) by mouth daily.   furosemide  (LASIX ) 20 MG tablet Take 2 tablets (40 mg total) by mouth daily.   Glucose Blood (BLOOD GLUCOSE TEST STRIPS) STRP 1 each by In Vitro route daily. May substitute to any manufacturer covered by patient's insurance.   Lancet Device MISC 1 each by Does not apply route daily. May substitute to any manufacturer covered by patient's insurance.   Lancets Misc. MISC 1 each by Does not apply route daily. May substitute to any manufacturer covered by patient's insurance.   loperamide  (IMODIUM ) 2 MG capsule Take 4 mg po once. Repeat with 2mg  po after each loose stool. Max dose is 16 mg/day   metoprolol  succinate (TOPROL -XL) 50 MG 24 hr tablet Take 1 tablet (50 mg total) by mouth daily.   montelukast  (SINGULAIR ) 10 MG tablet Take 1 tablet (10 mg total) by mouth at bedtime.   naloxone (NARCAN) nasal spray 4 mg/0.1 mL Spray the contents of 1 device into 1 nostril. Call 911. May repeat with 2nd device in alternate nostril if no response in 2-3 minutes.   [START ON 04/18/2024] oxyCODONE  (OXY IR/ROXICODONE ) 5 MG immediate release tablet Take 1-2 tablets (5-10 mg total) by mouth daily as needed for moderate pain (pain score 4-6) or severe pain (pain score 7-10).   [START ON 03/21/2024] oxyCODONE  (OXY IR/ROXICODONE ) 5 MG immediate release tablet Take 1-2 tablets (5-10 mg total) by mouth daily as needed for moderate pain (pain score 4-6) or  severe pain (pain score 7-10).   oxyCODONE  (OXY IR/ROXICODONE ) 5 MG immediate release tablet Take 1-2 tablets (5-10 mg total) by mouth daily as needed for moderate pain (pain score 4-6) or severe pain (pain score 7-10).   pantoprazole  (PROTONIX ) 40 MG tablet TAKE 1 TABLET BY MOUTH ONCE DAILY   pregabalin  (LYRICA ) 50 MG capsule Take 1 capsule (50 mg total) by mouth 2 (two) times daily.   rOPINIRole  (REQUIP ) 2 MG tablet Take 1 tablet (2 mg total) by mouth at bedtime.   rosuvastatin  (CRESTOR ) 10 MG  tablet Take 1 tablet (10 mg total) by mouth daily.   Specialty Vitamins Products (HAIR BOOSTER PO) Take 2 capsules by mouth daily. Moerie Ultimate Hair Boost Supplement (hyaluronic acid/vitamin b7/vitamin b9/vitamin b12/vitamin b6/zinc /selenium/copper /vitamin e)   triamcinolone  (NASACORT ) 55 MCG/ACT AERO nasal inhaler Place 2 sprays into the nose daily.   valsartan  (DIOVAN ) 80 MG tablet Take 1 tablet (80 mg total) by mouth daily.   [DISCONTINUED] ALPRAZolam  (XANAX ) 0.5 MG tablet TAKE 1/2 TO 1 TABLET BY MOUTH ONCE DAILYAS NEEDED FOR ACUTE ANXIETY   [DISCONTINUED] DULoxetine  (CYMBALTA ) 20 MG capsule Take 1 capsule (20 mg total) by mouth daily.   [DISCONTINUED] methocarbamol  (ROBAXIN ) 500 MG tablet Take 2 tablets (1,000 mg total) by mouth every 6 (six) hours as needed for muscle spasms.   [DISCONTINUED] mirtazapine  (REMERON ) 15 MG tablet Take 1 tablet (15 mg total) by mouth at bedtime.   [DISCONTINUED] oxyCODONE  (OXY IR/ROXICODONE ) 5 MG immediate release tablet Take 1-2 tablets (5-10 mg total) by mouth daily as needed for moderate pain (pain score 4-6) or severe pain (pain score 7-10).   [DISCONTINUED] oxyCODONE  (OXY IR/ROXICODONE ) 5 MG immediate release tablet Take 1-2 tablets (5-10 mg total) by mouth daily as needed for moderate pain (pain score 4-6) or severe pain (pain score 7-10).   [DISCONTINUED] oxyCODONE  (OXY IR/ROXICODONE ) 5 MG immediate release tablet Take 1-2 tablets (5-10 mg total) by mouth daily as  needed for moderate pain (pain score 4-6) or severe pain (pain score 7-10).   [DISCONTINUED] pregabalin  (LYRICA ) 50 MG capsule Take 1 capsule (50 mg total) by mouth 2 (two) times daily.   No facility-administered encounter medications on file as of 02/22/2024.    Surgical History: Past Surgical History:  Procedure Laterality Date   ABDOMINAL HYSTERECTOMY     ANKLE ARTHROSCOPY Bilateral    APPENDECTOMY     BLADDER SUSPENSION     BREAST EXCISIONAL BIOPSY Left 1990   neg   BREAST EXCISIONAL BIOPSY Right 1989   neg   BREAST SURGERY     CHOLECYSTECTOMY     COLONOSCOPY N/A 11/04/2015   Procedure: COLONOSCOPY;  Surgeon: Lamar ONEIDA Holmes, MD;  Location: [facility] ENDOSCOPY;  Service: Endoscopy;  Laterality: N/A;   COLONOSCOPY WITH PROPOFOL  N/A 08/30/2020   Procedure: COLONOSCOPY WITH PROPOFOL ;  Surgeon: Jinny Carmine, MD;  Location[facility];  Service: Endoscopy;  Laterality: N/A;   ESOPHAGOGASTRODUODENOSCOPY N/A 11/02/2015   Procedure: ESOPHAGOGASTRODUODENOSCOPY (EGD);  Surgeon: Lamar ONEIDA Holmes, MD;  Location: [facility] ENDOSCOPY;  Service: Endoscopy;  Laterality: N/A;   ESOPHAGOGASTRODUODENOSCOPY (EGD) WITH PROPOFOL  N/A 03/16/2022   Procedure: ESOPHAGOGASTRODUODENOSCOPY (EGD) WITH PROPOFOL  foregin body removal;  Surgeon: Jinny Carmine, MD;  Location: [facility];  Service: Endoscopy;  Laterality: N/A;  Diabetic   GASTROJEJUNOSTOMY N/A 08/25/2021   Procedure: LAPAROSCOPIC REDO OF STOMACH TO INTESTINE;  Surgeon: Stevie, Herlene Righter, MD;  Location: [facility] ORS;  Service: General;  Laterality: N/A;   KNEE ARTHROSCOPY Left    PARTIAL GASTRECTOMY N/A 08/25/2021   Procedure: PARTIAL GASTRECTOMY;  Surgeon: Stevie Herlene Righter, MD;  Location: [facility];  Service: General;  Laterality: N/A;   POLYPECTOMY  08/30/2020   Procedure: POLYPECTOMY;  Surgeon: Jinny Carmine, MD;  Location: [facility];  Service: Endoscopy;;   REDUCTION MAMMAPLASTY Bilateral 1988   TONSILLECTOMY     TOTAL KNEE  ARTHROPLASTY Left 02/15/2023   Procedure: TOTAL KNEE ARTHROPLASTY;  Surgeon: Rubie Kemps, MD;  Location: [facility];  Service: Orthopedics;  Laterality: Left;   UPPER GI ENDOSCOPY  08/25/2021   Procedure:  UPPER GI ENDOSCOPY;  Surgeon: Kinsinger, Herlene Righter, MD;  Location: [facility];  Service: General;;    Medical History: Past Medical History:  Diagnosis Date   Allergy    Phreesia 07/23/2020   Anxiety    Phreesia 07/23/2020   COPD (chronic obstructive pulmonary disease) (HCC)    DDD (degenerative disc disease), thoracic    Depression    Phreesia 07/23/2020   Diabetes mellitus without complication (HCC)    type 2   GERD (gastroesophageal reflux disease)    Hyperlipidemia    Nausea and vomiting 03/04/2022   Neuromuscular disorder (HCC)    severe neuropathy feet   Pneumonia    Rapid heart rate    Sleep apnea    no longer uses CPAP due to weight loss   TMJ (temporomandibular joint disorder) 11/28/2022   Wears contact lenses     Family History: Family History  Problem Relation Age of Onset   Hypertension Other    Bladder Cancer Mother    High Cholesterol Mother    High blood pressure Mother    Cancer Mother    Heart attack Father    High blood pressure Father    Kidney cancer Neg Hx    Prostate cancer Neg Hx    Breast cancer Neg Hx     Social History   Socioeconomic History   Marital status: Married    Spouse name: Not on file   Number of children: Not on file   Years of education: Not on file   Highest education level: Not on file  Occupational History   Not on file  Tobacco Use   Smoking status: Former    Current packs/day: 0.00    Types: Cigarettes    Start date: 17    Quit date: 1996    Years since quitting: 29.8    Passive exposure: Past   Smokeless tobacco: Never   Tobacco comments:    quit in 1996  Vaping Use   Vaping status: Never Used  Substance and Sexual Activity   Alcohol use: Not Currently    Alcohol/week: 2.0 standard drinks of alcohol     Types: 1 Glasses of wine, 1 Standard drinks or equivalent per week    Comment: occasional  social   Drug use: No   Sexual activity: Yes    Partners: Male    Birth control/protection: Surgical, Other-see comments  Other Topics Concern   Not on file  Social History Narrative   Not on file   Social Drivers of Health   Financial Resource Strain: Low Risk  (02/02/2023)   Overall Financial Resource Strain (CARDIA)    Difficulty of Paying Living Expenses: Not hard at all  Food Insecurity: No Food Insecurity (02/15/2023)   Hunger Vital Sign    Worried About Running Out of Food in the Last Year: Never true    Ran Out of Food in the Last Year: Never true  Transportation Needs: No Transportation Needs (02/15/2023)   PRAPARE - Administrator, Civil Service (Medical): No    Lack of Transportation (Non-Medical): No  Physical Activity: Inactive (02/02/2023)   Exercise Vital Sign    Days of Exercise per Week: 0 days    Minutes of Exercise per Session: 0 min  Stress: No Stress Concern Present (02/02/2023)   [employer] of Occupational Health - Occupational Stress Questionnaire    Feeling of Stress : Not at all  Social Connections: Socially Integrated (02/02/2023)   Social Connection and Isolation  Panel    Frequency of Communication with Friends and Family: More than three times a week    Frequency of Social Gatherings with Friends and Family: More than three times a week    Attends Religious Services: More than 4 times per year    Active Member of [employer] or Organizations: Yes    Attends Engineer, Structural: More than 4 times per year    Marital Status: Married  Catering Manager Violence: Not At Risk (02/15/2023)   Humiliation, Afraid, Rape, and Kick questionnaire    Fear of Current or Ex-Partner: No    Emotionally Abused: No    Physically Abused: No    Sexually Abused: No      Review of Systems  Constitutional:  Positive for fatigue and unexpected weight change.  Negative for activity change, appetite change, chills and fever.  HENT: Negative.  Negative for congestion, ear pain, rhinorrhea, sore throat and trouble swallowing.   Eyes: Negative.   Respiratory: Negative.  Negative for cough, chest tightness, shortness of breath and wheezing.   Cardiovascular: Negative.  Negative for chest pain.  Gastrointestinal: Negative.  Negative for abdominal pain, blood in stool, constipation, diarrhea, nausea and vomiting.  Endocrine: Negative.   Genitourinary: Negative.  Negative for difficulty urinating, dysuria, frequency, hematuria and urgency.  Musculoskeletal: Negative.  Negative for arthralgias, back pain, joint swelling, myalgias and neck pain.  Skin: Negative.  Negative for rash and wound.  Allergic/Immunologic: Negative.  Negative for immunocompromised state.  Neurological: Negative.  Negative for dizziness, seizures, numbness and headaches.  Hematological: Negative.   Psychiatric/Behavioral:  Positive for behavioral problems, decreased concentration and sleep disturbance. Negative for self-injury and suicidal ideas. The patient is nervous/anxious.     Vital Signs: BP 132/86   Pulse 81   Temp 97.6 F (36.4 C)   Resp 16   Ht 5' 4 (1.626 m)   Wt 208 lb 3.2 oz (94.4 kg)   SpO2 96%   BMI 35.74 kg/m    Physical Exam Constitutional:      General: She is not in acute distress.    Appearance: Normal appearance. She is well-developed. She is obese. She is not ill-appearing or diaphoretic.  HENT:     Head: Normocephalic and atraumatic.     Right Ear: Tympanic membrane, ear canal and external ear normal.     Left Ear: Tympanic membrane, ear canal and external ear normal.     Nose: Nose normal. No congestion or rhinorrhea.     Mouth/Throat:     Mouth: Mucous membranes are moist.     Pharynx: Oropharynx is clear. No oropharyngeal exudate or posterior oropharyngeal erythema.  Eyes:     Extraocular Movements: Extraocular movements intact.      Conjunctiva/sclera: Conjunctivae normal.     Pupils: Pupils are equal, round, and reactive to light.  Neck:     Thyroid : No thyromegaly.     Vascular: No JVD.     Trachea: No tracheal deviation.  Cardiovascular:     Rate and Rhythm: Normal rate and regular rhythm.     Heart sounds: Normal heart sounds. No murmur heard.    No friction rub. No gallop.  Pulmonary:     Effort: Pulmonary effort is normal. No respiratory distress.     Breath sounds: No wheezing or rales.  Chest:     Chest wall: No tenderness.  Abdominal:     General: Bowel sounds are normal.     Palpations: Abdomen is soft.  Musculoskeletal:  General: Normal range of motion.     Cervical back: Normal range of motion and neck supple.     Right lower leg: Edema present.     Left lower leg: Edema present.  Lymphadenopathy:     Cervical: No cervical adenopathy.  Skin:    General: Skin is warm and dry.     Capillary Refill: Capillary refill takes less than 2 seconds.  Neurological:     Mental Status: She is alert and oriented to person, place, and time.     Cranial Nerves: No cranial nerve deficit.  Psychiatric:        Attention and Perception: She is inattentive.        Mood and Affect: Mood is anxious and depressed. Affect is tearful.        Speech: Speech is delayed.        Behavior: Behavior is slowed and withdrawn. Behavior is cooperative.        Thought Content: Thought content normal. Thought content is not paranoid or delusional. Thought content does not include homicidal or suicidal ideation.        Cognition and Memory: Cognition is impaired. Memory is impaired. She exhibits impaired recent memory.        Judgment: Judgment normal. Judgment is not impulsive.        Assessment/Plan: 1. Encounter for Medicare annual examination with abnormal findings (Primary) Age-appropriate preventive screenings and vaccinations discussed. Routine labs for health maintenance results discussed with the patient. PHM  updated.    2. Forgetfulness Referred to neurology for further evaluation  - Ambulatory referral to Neurology  3. B12 deficiency Continue B12 injections at home  - cyanocobalamin  (VITAMIN B12) 1000 MCG/ML injection; Per month ( pt needs syringes with needles as well ) self administered, administer 1 ml IM once a week x3 doses then decrease to monthly x5 more doses.  Dispense: 10 mL; Refill: 0 - Syringe/Needle, Disp, (SYRINGE 3CC/25GX5/8) 25G X 5/8 3 ML MISC; Please use 1 needle and syringe to draw up medication and then replace the needle with a new needle from a new syringe. For B12 injections to be done at home.  Dispense: 6 each; Refill: 3  4. Vitamin D  deficiency Continue vitamin D  weekly supplement as prescribed.  - Vitamin D , Ergocalciferol , (DRISDOL) 1.25 MG (50000 UNIT) CAPS capsule; Take 1 capsule (50,000 Units total) by mouth every 7 (seven) days.  Dispense: 12 capsule; Refill: 1  5. Peripheral neuropathic pain Continue pregabalin  as prescribed.  - pregabalin  (LYRICA ) 50 MG capsule; Take 1 capsule (50 mg total) by mouth 2 (two) times daily.  Dispense: 180 capsule; Refill: 0  6. Chronic pain syndrome Continue prn oxycodone  as prescribed  - oxyCODONE  (OXY IR/ROXICODONE ) 5 MG immediate release tablet; Take 1-2 tablets (5-10 mg total) by mouth daily as needed for moderate pain (pain score 4-6) or severe pain (pain score 7-10).  Dispense: 30 tablet; Refill: 0 - oxyCODONE  (OXY IR/ROXICODONE ) 5 MG immediate release tablet; Take 1-2 tablets (5-10 mg total) by mouth daily as needed for moderate pain (pain score 4-6) or severe pain (pain score 7-10).  Dispense: 30 tablet; Refill: 0 - oxyCODONE  (OXY IR/ROXICODONE ) 5 MG immediate release tablet; Take 1-2 tablets (5-10 mg total) by mouth daily as needed for moderate pain (pain score 4-6) or severe pain (pain score 7-10).  Dispense: 30 tablet; Refill: 0  7. Bilateral lower extremity edema Referred to vascular surgery  - Ambulatory referral  to Vascular Surgery  8. Ovarian failure due to  menopause Dexa scan ordered  - DG Bone Density; Future  9. Encounter for screening mammogram for malignant neoplasm of breast Routine mammogram ordered  - MM 3D SCREENING MAMMOGRAM BILATERAL BREAST; Future  10. Needs flu shot Flu vaccine administered in office today  - Influenza, MDCK, trivalent, PF(Flucelvax egg-free)  11. GAD (generalized anxiety disorder) Referred to counseling. Continue duloxetine  and trazodone as prescribed.  - Ambulatory referral to Psychology - DULoxetine  (CYMBALTA ) 30 MG capsule; Take 1 capsule (30 mg total) by mouth daily.  Dispense: 30 capsule; Refill: 3 - traZODone (DESYREL) 50 MG tablet; Take 0.5-1 tablets (25-50 mg total) by mouth at bedtime.  Dispense: 30 tablet; Refill: 3  12. Moderate episode of recurrent major depressive disorder (HCC) Referred to counseling. Continue duloxetine  and trazodone as prescribed.  - Ambulatory referral to Psychology - DULoxetine  (CYMBALTA ) 30 MG capsule; Take 1 capsule (30 mg total) by mouth daily.  Dispense: 30 capsule; Refill: 3 - traZODone (DESYREL) 50 MG tablet; Take 0.5-1 tablets (25-50 mg total) by mouth at bedtime.  Dispense: 30 tablet; Refill: 3  13. Family history of dementia Referred to neurology for further evaluation  - Ambulatory referral to Neurology    General Counseling: pia jedlicka understanding of the findings of todays visit and agrees with plan of treatment. I have discussed any further diagnostic evaluation that may be needed or ordered today. We also reviewed her medications today. she has been encouraged to call the office with any questions or concerns that should arise related to todays visit.    Orders Placed This Encounter  Procedures   MM 3D SCREENING MAMMOGRAM BILATERAL BREAST   DG Bone Density   Influenza, MDCK, trivalent, PF(Flucelvax egg-free)   Ambulatory referral to Psychology   Ambulatory referral to Vascular Surgery    Ambulatory referral to Neurology    Meds ordered this encounter  Medications   Vitamin D , Ergocalciferol , (DRISDOL) 1.25 MG (50000 UNIT) CAPS capsule    Sig: Take 1 capsule (50,000 Units total) by mouth every 7 (seven) days.    Dispense:  12 capsule    Refill:  1   cyanocobalamin  (VITAMIN B12) 1000 MCG/ML injection    Sig: Per month ( pt needs syringes with needles as well ) self administered, administer 1 ml IM once a week x3 doses then decrease to monthly x5 more doses.    Dispense:  10 mL    Refill:  0    Fill new script today   Syringe/Needle, Disp, (SYRINGE 3CC/25GX5/8) 25G X 5/8 3 ML MISC    Sig: Please use 1 needle and syringe to draw up medication and then replace the needle with a new needle from a new syringe. For B12 injections to be done at home.    Dispense:  6 each    Refill:  3    Fill new script today   DULoxetine  (CYMBALTA ) 30 MG capsule    Sig: Take 1 capsule (30 mg total) by mouth daily.    Dispense:  30 capsule    Refill:  3    Fill new script today, note increased dose. Discontinue 20 mg dose. Also discontinue mirtazapine .   traZODone (DESYREL) 50 MG tablet    Sig: Take 0.5-1 tablets (25-50 mg total) by mouth at bedtime.    Dispense:  30 tablet    Refill:  3    Fill new script today. Discontinue mirtazapine .   pregabalin  (LYRICA ) 50 MG capsule    Sig: Take 1 capsule (50 mg total) by mouth  2 (two) times daily.    Dispense:  180 capsule    Refill:  0   oxyCODONE  (OXY IR/ROXICODONE ) 5 MG immediate release tablet    Sig: Take 1-2 tablets (5-10 mg total) by mouth daily as needed for moderate pain (pain score 4-6) or severe pain (pain score 7-10).    Dispense:  30 tablet    Refill:  0    Chronic prescription, Fill for December.   oxyCODONE  (OXY IR/ROXICODONE ) 5 MG immediate release tablet    Sig: Take 1-2 tablets (5-10 mg total) by mouth daily as needed for moderate pain (pain score 4-6) or severe pain (pain score 7-10).    Dispense:  30 tablet    Refill:  0     Chronic prescription, Fill for November   oxyCODONE  (OXY IR/ROXICODONE ) 5 MG immediate release tablet    Sig: Take 1-2 tablets (5-10 mg total) by mouth daily as needed for moderate pain (pain score 4-6) or severe pain (pain score 7-10).    Dispense:  30 tablet    Refill:  0    Fill for October    Return in about 1 month (around 03/24/2024) for F/U, eval new med, Ionna Avis PCP.   Total time spent:30 Minutes Time spent includes review of chart, medications, test results, and follow up plan with the patient.   [place] Controlled Substance Database was reviewed by me.  This patient was seen by Mardy Maxin, FNP-C in collaboration with Dr. Sigrid Bathe as a part of collaborative care agreement.  Matayah Reyburn R. Maxin, MSN, FNP-C Internal medicine

## 2024-02-22 NOTE — Telephone Encounter (Signed)
 Lvm notifying patient of mammogram & dexa appointment date, arrival time, location. Instructed not to take calcium  supplements-Wendy Hubbard

## 2024-02-23 ENCOUNTER — Telehealth: Payer: Self-pay | Admitting: Nurse Practitioner

## 2024-02-23 NOTE — Telephone Encounter (Signed)
 Awaiting 02/22/24 office notes for Neurology & Psych referral-Toni

## 2024-02-29 NOTE — Progress Notes (Signed)
 Wendy Hubbard                                          MRN: [id number]   02/29/2024   The [facility]I Quality Team Specialist reviewed this patient medical record for the purposes of chart review for care gap closure. The following were reviewed: chart review for care gap closure-kidney health evaluation for diabetes:eGFR  and uACR.    VBCI Quality Team

## 2024-03-01 ENCOUNTER — Other Ambulatory Visit: Payer: Self-pay | Admitting: Nurse Practitioner

## 2024-03-02 ENCOUNTER — Ambulatory Visit: Payer: Self-pay | Admitting: Internal Medicine

## 2024-03-02 DIAGNOSIS — Z96652 Presence of left artificial knee joint: Secondary | ICD-10-CM | POA: Diagnosis not present

## 2024-03-07 ENCOUNTER — Telehealth: Payer: Self-pay

## 2024-03-07 DIAGNOSIS — M792 Neuralgia and neuritis, unspecified: Secondary | ICD-10-CM | POA: Insufficient documentation

## 2024-03-07 NOTE — Telephone Encounter (Signed)
 Pt called that her leg are still swelling and lasix  was given at last visit advised her we can make her appt pt unable to come in due to her work advised her as per alyssa that we will placed referral for vein vascular pt like in [place] also pt was saying that her anxiety med is not working advised her that she just increased so its take time and she already had follow up coming in but if she like change her appt call us  back

## 2024-03-08 ENCOUNTER — Telehealth: Payer: Self-pay | Admitting: Nurse Practitioner

## 2024-03-08 ENCOUNTER — Other Ambulatory Visit

## 2024-03-08 ENCOUNTER — Other Ambulatory Visit: Payer: Self-pay | Admitting: Nurse Practitioner

## 2024-03-08 ENCOUNTER — Telehealth: Payer: Self-pay

## 2024-03-08 DIAGNOSIS — F411 Generalized anxiety disorder: Secondary | ICD-10-CM

## 2024-03-08 MED ORDER — ALPRAZOLAM 0.5 MG PO TABS: 0.2500 mg | ORAL_TABLET | Freq: Two times a day (BID) | ORAL | 1 refills | Status: DC | PRN

## 2024-03-08 NOTE — Telephone Encounter (Signed)
 Pt notified  that we sent

## 2024-03-08 NOTE — Telephone Encounter (Signed)
 Awaiting 03/07/24 office notes for vascular surgery referral-Toni

## 2024-03-09 ENCOUNTER — Ambulatory Visit (INDEPENDENT_AMBULATORY_CARE_PROVIDER_SITE_OTHER): Admitting: Nurse Practitioner

## 2024-03-09 ENCOUNTER — Encounter: Payer: Self-pay | Admitting: Nurse Practitioner

## 2024-03-09 VITALS — BP 134/74 | HR 91 | Temp 97.1°F | Resp 16 | Ht 64.0 in | Wt 208.8 lb

## 2024-03-09 DIAGNOSIS — F331 Major depressive disorder, recurrent, moderate: Secondary | ICD-10-CM | POA: Diagnosis not present

## 2024-03-09 DIAGNOSIS — F411 Generalized anxiety disorder: Secondary | ICD-10-CM

## 2024-03-09 DIAGNOSIS — R6889 Other general symptoms and signs: Secondary | ICD-10-CM | POA: Diagnosis not present
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
## 2024-03-10 ENCOUNTER — Encounter: Payer: Self-pay | Admitting: Nurse Practitioner

## 2024-03-14 ENCOUNTER — Telehealth: Payer: Self-pay | Admitting: Nurse Practitioner

## 2024-03-14 NOTE — Telephone Encounter (Signed)
 Received disability paperwork. Gave to Alyssa to complete-Toni

## 2024-03-15 ENCOUNTER — Encounter: Payer: Self-pay | Admitting: Nurse Practitioner

## 2024-03-15 DIAGNOSIS — R6889 Other general symptoms and signs: Secondary | ICD-10-CM | POA: Insufficient documentation

## 2024-03-15 DIAGNOSIS — R600 Localized edema: Secondary | ICD-10-CM | POA: Insufficient documentation

## 2024-03-15 DIAGNOSIS — Z818 Family history of other mental and behavioral disorders: Secondary | ICD-10-CM | POA: Insufficient documentation

## 2024-03-15 DIAGNOSIS — E2839 Other primary ovarian failure: Secondary | ICD-10-CM | POA: Insufficient documentation

## 2024-03-16 ENCOUNTER — Telehealth: Payer: Self-pay | Admitting: Nurse Practitioner

## 2024-03-16 NOTE — Telephone Encounter (Signed)
 Called patient to let her know her referrals had been faxed. Her neurology referral had been faxed to [facility].  She then tells me she already has neurology appointment with [facility] neurology. I then tell her I have faxed her Vascular referral to [facility]. She tells me she needs it to be sent to [facility] location. I explained to her the ordered referrals stated to fax to [facility] locations. Now that she is on medical leave, she needs them to go to [facility]. Vascular referral send via Epic to Vascular Surgery in [place]. Psychology referral faxed to [facility]. Gave patient telephone #s-Toni

## 2024-03-20 ENCOUNTER — Encounter: Payer: Self-pay | Admitting: Diagnostic Neuroimaging

## 2024-03-20 ENCOUNTER — Ambulatory Visit: Admitting: Diagnostic Neuroimaging

## 2024-03-20 VITALS — BP 132/76 | HR 91 | Ht 64.0 in | Wt 206.2 lb

## 2024-03-20 DIAGNOSIS — Z789 Other specified health status: Secondary | ICD-10-CM | POA: Diagnosis not present

## 2024-03-20 NOTE — Progress Notes (Signed)
 [facility]  PATIENT: Wendy Hubbard DOB: March 31, 1956  REFERRING CLINICIAN: Liana Fish, NP HISTORY FROM: patient  REASON FOR VISIT: new consult   HISTORICAL  CHIEF COMPLAINT:  Chief Complaint  Patient presents with   RM 7     Patient is here alone for memory concerns - mother was diagnosed here for dementia. Has been issues remembering stuff. 10.30. 25 was the last time she was at work. She was put on leave due to memory issues.  Works at [employer]. Not able to remember small details  MoCA - 27    HISTORY OF PRESENT ILLNESS:   68 year old female here for evaluation of cognitive symptoms.  Patient started a new job about 1 year ago with a change in her routine, duties and job demands.  Patient does not feel like she has any memory complaints but apparently her supervisor thinks that she may be having some difficulty.  Apparently she is then on leave due to these discrepancies between what she feels and what her supervisor feels.  Patient also has family history of dementia and therefore wanted to get an evaluation as well.  Outside of work patient has no difficulties with memory or cognitive issues.  She is able to maintain her ADLs.   REVIEW OF SYSTEMS: Full 14 system review of systems performed and negative with exception of: as per HPI.  ALLERGIES: Allergies  Allergen Reactions   Flagyl [Metronidazole] Anaphylaxis and Rash   Penicillins Anaphylaxis   Doxycycline Hives    HOME MEDICATIONS: Outpatient Medications Prior to Visit  Medication Sig Dispense Refill   alosetron  (LOTRONEX ) 1 MG tablet Take 1 tablet (1 mg total) by mouth 2 (two) times daily. 60 tablet 5   ALPRAZolam  (XANAX ) 0.5 MG tablet Take 0.5-1 tablets (0.25-0.5 mg total) by mouth 2 (two) times daily as needed for anxiety. 60 tablet 1   Blood Glucose Monitoring Suppl DEVI 1 each by Does not apply route in the morning, at noon, and at bedtime. May substitute to any  manufacturer covered by patient's insurance. 1 each 0   cilostazol  (PLETAL ) 100 MG tablet Take 1 tablet (100 mg total) by mouth 2 (two) times daily. 180 tablet 3   cyanocobalamin  (VITAMIN B12) 1000 MCG/ML injection Per month ( pt needs syringes with needles as well ) self administered, administer 1 ml IM once a week x3 doses then decrease to monthly x5 more doses. 10 mL 0   desloratadine  (CLARINEX ) 5 MG tablet Take 1 tablet (5 mg total) by mouth daily. 90 tablet 1   DULoxetine  (CYMBALTA ) 30 MG capsule Take 1 capsule (30 mg total) by mouth daily. 30 capsule 3   furosemide  (LASIX ) 20 MG tablet Take 2 tablets (40 mg total) by mouth daily. 180 tablet 1   Glucose Blood (BLOOD GLUCOSE TEST STRIPS) STRP 1 each by In Vitro route daily. May substitute to any manufacturer covered by patient's insurance. 100 strip 1   Lancet Device MISC 1 each by Does not apply route daily. May substitute to any manufacturer covered by patient's insurance. 1 each 0   Lancets Misc. MISC 1 each by Does not apply route daily. May substitute to any manufacturer covered by patient's insurance. 100 each 0   loperamide  (IMODIUM ) 2 MG capsule Take 4 mg po once. Repeat with 2mg  po after each loose stool. Max dose is 16 mg/day 135 capsule 1   methocarbamol  (ROBAXIN ) 500 MG tablet TAKE 2 TABLETS BY MOUTH EVERY 6 HOURS ASNEEDED  FOR MUSCLE SPASMS 120 tablet 0   metoprolol  succinate (TOPROL -XL) 50 MG 24 hr tablet Take 1 tablet (50 mg total) by mouth daily. 90 tablet 1   montelukast  (SINGULAIR ) 10 MG tablet Take 1 tablet (10 mg total) by mouth at bedtime. 90 tablet 3   [START ON 04/18/2024] oxyCODONE  (OXY IR/ROXICODONE ) 5 MG immediate release tablet Take 1-2 tablets (5-10 mg total) by mouth daily as needed for moderate pain (pain score 4-6) or severe pain (pain score 7-10). 30 tablet 0   [START ON 03/21/2024] oxyCODONE  (OXY IR/ROXICODONE ) 5 MG immediate release tablet Take 1-2 tablets (5-10 mg total) by mouth daily as needed for moderate pain  (pain score 4-6) or severe pain (pain score 7-10). 30 tablet 0   oxyCODONE  (OXY IR/ROXICODONE ) 5 MG immediate release tablet Take 1-2 tablets (5-10 mg total) by mouth daily as needed for moderate pain (pain score 4-6) or severe pain (pain score 7-10). 30 tablet 0   pantoprazole  (PROTONIX ) 40 MG tablet TAKE 1 TABLET BY MOUTH ONCE DAILY 90 tablet 3   pregabalin  (LYRICA ) 50 MG capsule Take 1 capsule (50 mg total) by mouth 2 (two) times daily. 180 capsule 0   rOPINIRole  (REQUIP ) 2 MG tablet Take 1 tablet (2 mg total) by mouth at bedtime. 90 tablet 3   rosuvastatin  (CRESTOR ) 10 MG tablet Take 1 tablet (10 mg total) by mouth daily. 90 tablet 1   Specialty Vitamins Products (HAIR BOOSTER PO) Take 2 capsules by mouth daily. Moerie Ultimate Hair Boost Supplement (hyaluronic acid/vitamin b7/vitamin b9/vitamin b12/vitamin b6/zinc /selenium/copper /vitamin e)     Syringe/Needle, Disp, (SYRINGE 3CC/25GX5/8) 25G X 5/8 3 ML MISC Please use 1 needle and syringe to draw up medication and then replace the needle with a new needle from a new syringe. For B12 injections to be done at home. 6 each 3   traZODone (DESYREL) 50 MG tablet Take 0.5-1 tablets (25-50 mg total) by mouth at bedtime. 30 tablet 3   triamcinolone  (NASACORT ) 55 MCG/ACT AERO nasal inhaler Place 2 sprays into the nose daily. 1 each 6   valsartan  (DIOVAN ) 80 MG tablet Take 1 tablet (80 mg total) by mouth daily. 90 tablet 3   naloxone (NARCAN) nasal spray 4 mg/0.1 mL Spray the contents of 1 device into 1 nostril. Call 911. May repeat with 2nd device in alternate nostril if no response in 2-3 minutes. (Patient not taking: Reported on 03/20/2024)     Vitamin D , Ergocalciferol , (DRISDOL) 1.25 MG (50000 UNIT) CAPS capsule Take 1 capsule (50,000 Units total) by mouth every 7 (seven) days. (Patient not taking: Reported on 03/20/2024) 12 capsule 1   No facility-administered medications prior to visit.    PAST MEDICAL HISTORY: Past Medical History:  Diagnosis  Date   Allergy    Phreesia 07/23/2020   Anxiety    Phreesia 07/23/2020   COPD (chronic obstructive pulmonary disease) (HCC)    DDD (degenerative disc disease), thoracic    Depression    Phreesia 07/23/2020   Diabetes mellitus without complication (HCC)    type 2   GERD (gastroesophageal reflux disease)    Hyperlipidemia    Nausea and vomiting 03/04/2022   Neuromuscular disorder (HCC)    severe neuropathy feet   Pneumonia    Rapid heart rate    Sleep apnea    no longer uses CPAP due to weight loss   TMJ (temporomandibular joint disorder) 11/28/2022   Wears contact lenses     PAST SURGICAL HISTORY: Past Surgical History:  Procedure Laterality  Date   ABDOMINAL HYSTERECTOMY     ANKLE ARTHROSCOPY Bilateral    APPENDECTOMY     BLADDER SUSPENSION     BREAST EXCISIONAL BIOPSY Left 1990   neg   BREAST EXCISIONAL BIOPSY Right 1989   neg   BREAST SURGERY     CHOLECYSTECTOMY     COLONOSCOPY N/A 11/04/2015   Procedure: COLONOSCOPY;  Surgeon: Lamar ONEIDA Holmes, MD;  Location: [facility] ENDOSCOPY;  Service: Endoscopy;  Laterality: N/A;   COLONOSCOPY WITH PROPOFOL  N/A 08/30/2020   Procedure: COLONOSCOPY WITH PROPOFOL ;  Surgeon: Jinny Carmine, MD;  Location: [facility];  Service: Endoscopy;  Laterality: N/A;   ESOPHAGOGASTRODUODENOSCOPY N/A 11/02/2015   Procedure: ESOPHAGOGASTRODUODENOSCOPY (EGD);  Surgeon: Lamar ONEIDA Holmes, MD;  Location[facility] ENDOSCOPY;  Service: Endoscopy;  Laterality: N/A;   ESOPHAGOGASTRODUODENOSCOPY (EGD) WITH PROPOFOL  N/A 03/16/2022   Procedure: ESOPHAGOGASTRODUODENOSCOPY (EGD) WITH PROPOFOL  foregin body removal;  Surgeon: Jinny Carmine, MD;  Location: [facility];  Service: Endoscopy;  Laterality: N/A;  Diabetic   GASTROJEJUNOSTOMY N/A 08/25/2021   Procedure: LAPAROSCOPIC REDO OF STOMACH TO INTESTINE;  Surgeon: Stevie, Herlene Righter, MD;  Location: [facility];  Service: General;  Laterality: N/A;   KNEE ARTHROSCOPY Left    KNEE ARTHROSCOPY Right 2025    september   PARTIAL GASTRECTOMY N/A 08/25/2021   Procedure: PARTIAL GASTRECTOMY;  Surgeon: Stevie Herlene Righter, MD;  Location: [facility];  Service: General;  Laterality: N/A;   POLYPECTOMY  08/30/2020   Procedure: POLYPECTOMY;  Surgeon: Jinny Carmine, MD;  Location: [facility];  Service: Endoscopy;;   REDUCTION MAMMAPLASTY Bilateral 1988   TONSILLECTOMY     TOTAL KNEE ARTHROPLASTY Left 02/15/2023   Procedure: TOTAL KNEE ARTHROPLASTY;  Surgeon: Rubie Kemps, MD;  Location: [facility];  Service: Orthopedics;  Laterality: Left;   UPPER GI ENDOSCOPY  08/25/2021   Procedure: UPPER GI ENDOSCOPY;  Surgeon: Kinsinger, Herlene Righter, MD;  Location: [facility];  Service: General;;    FAMILY HISTORY: Family History  Problem Relation Age of Onset   Bladder Cancer Mother    High Cholesterol Mother    High blood pressure Mother    Cancer Mother    Dementia Mother    Heart attack Father    High blood pressure Father    Hypertension Other    Kidney cancer Neg Hx    Prostate cancer Neg Hx    Breast cancer Neg Hx     SOCIAL HISTORY: Social History   Socioeconomic History   Marital status: Married    Spouse name: Not on file   Number of children: Not on file   Years of education: Not on file   Highest education level: Not on file  Occupational History   Not on file  Tobacco Use   Smoking status: Former    Current packs/day: 0.00    Types: Cigarettes    Start date: 83    Quit date: 1996    Years since quitting: 29.8    Passive exposure: Past   Smokeless tobacco: Never   Tobacco comments:    quit in 1996  Vaping Use   Vaping status: Never Used  Substance and Sexual Activity   Alcohol use: Not Currently    Alcohol/week: 2.0 standard drinks of alcohol    Types: 1 Glasses of wine, 1 Standard drinks or equivalent per week    Comment: occasional  social   Drug use: No   Sexual activity: Yes    Partners: Male    Birth control/protection: Surgical, Other-see  comments  Other Topics  Concern   Not on file  Social History Narrative   2 cups of Caffeine  daily    Social Drivers of Health   Financial Resource Strain: Low Risk  (02/02/2023)   Overall Financial Resource Strain (CARDIA)    Difficulty of Paying Living Expenses: Not hard at all  Food Insecurity: No Food Insecurity (02/15/2023)   Hunger Vital Sign    Worried About Running Out of Food in the Last Year: Never true    Ran Out of Food in the Last Year: Never true  Transportation Needs: No Transportation Needs (02/15/2023)   PRAPARE - Administrator, Civil Service (Medical): No    Lack of Transportation (Non-Medical): No  Physical Activity: Inactive (02/02/2023)   Exercise Vital Sign    Days of Exercise per Week: 0 days    Minutes of Exercise per Session: 0 min  Stress: No Stress Concern Present (02/02/2023)   Wendy [employer] - Occupational Stress Questionnaire    Feeling of Stress : Not at all  Social Connections: Socially Integrated (02/02/2023)   Social Connection and Isolation Panel    Frequency of Communication with Friends and Family: More than three times a week    Frequency of Social Gatherings with Friends and Family: More than three times a week    Attends Religious Services: More than 4 times per year    Active Member of [employer] or Organizations: Yes    Attends Banker Meetings: More than 4 times per year    Marital Status: Married  Catering Manager Violence: Not At Risk (02/15/2023)   Humiliation, Afraid, Rape, and Kick questionnaire    Fear of Current or Ex-Partner: No    Emotionally Abused: No    Physically Abused: No    Sexually Abused: No     PHYSICAL EXAM  GENERAL EXAM/CONSTITUTIONAL: Vitals:  Vitals:   03/20/24 1052  BP: 132/76  Pulse: 91  SpO2: 97%  Weight: 206 lb 3.2 oz (93.5 kg)  Height: 5' 4 (1.626 m)   Body mass index is 35.39 kg/m. Wt Readings from Last 3 Encounters:  03/20/24 206 lb 3.2 oz (93.5 kg)  03/09/24 208 lb 12.8  oz (94.7 kg)  02/22/24 208 lb 3.2 oz (94.4 kg)   Patient is in no distress; well developed, nourished and groomed; neck is supple  CARDIOVASCULAR: Examination of carotid arteries is normal; no carotid bruits Regular rate and rhythm, no murmurs Examination of peripheral vascular system by observation and palpation is normal  EYES: Ophthalmoscopic exam of optic discs and posterior segments is normal; no papilledema or hemorrhages No results found.  MUSCULOSKELETAL: Gait, strength, tone, movements noted in Neurologic exam below  NEUROLOGIC: MENTAL STATUS:     02/22/2024    8:27 AM  MMSE - Mini Mental State Exam  Orientation to time 0  Orientation to Place 5  Registration 3  Attention/ Calculation 5  Recall 3  Language- name 2 objects 2  Language- repeat 1  Language- follow 3 step command 3  Language- read & follow direction 1  Write a sentence 1  Copy design 1  Total score 25      03/20/2024   10:55 AM  Montreal Cognitive Assessment   Visuospatial/ Executive (0/5) 4  Naming (0/3) 3  Attention: Read list of digits (0/2) 2  Attention: Read list of letters (0/1) 1  Attention: Serial 7 subtraction starting at 100 (0/3) 3  Language: Repeat phrase (0/2) 2  Language : Fluency (0/1) 1  Abstraction (0/2) 1  Delayed Recall (0/5) 4  Orientation (0/6) 6  Total 27  Adjusted Score (based on education) 27   awake, alert, oriented to person, place and time recent and remote memory intact normal attention and concentration language fluent, comprehension intact, naming intact fund of knowledge appropriate  CRANIAL NERVE:  2nd - no papilledema on fundoscopic exam 2nd, 3rd, 4th, 6th - pupils equal and reactive to light, visual fields full to confrontation, extraocular muscles intact, no nystagmus 5th - facial sensation symmetric 7th - facial strength symmetric 8th - hearing intact 9th - palate elevates symmetrically, uvula midline 11th - shoulder shrug symmetric 12th -  tongue protrusion midline  MOTOR:  normal bulk and tone, full strength in the BUE, BLE  SENSORY:  normal and symmetric to light touch, temperature, vibration  COORDINATION:  finger-nose-finger, fine finger movements normal  REFLEXES:  deep tendon reflexes present and symmetric  GAIT/STATION:  narrow based gait     DIAGNOSTIC DATA (LABS, IMAGING, TESTING) - I reviewed patient records, labs, notes, testing and imaging myself where available.  Lab Results  Component Value Date   WBC 11.5 (H) 02/18/2024   HGB 13.4 02/18/2024   HCT 41.4 02/18/2024   MCV 96 02/18/2024   PLT 436 02/18/2024      Component Value Date/Time   NA 145 (H) 02/18/2024 1103   K 4.3 02/18/2024 1103   CL 105 02/18/2024 1103   CO2 25 02/18/2024 1103   GLUCOSE 95 02/18/2024 1103   GLUCOSE 138 (H) 02/03/2023 1344   BUN 12 02/18/2024 1103   CREATININE 0.90 02/18/2024 1103   CALCIUM  9.6 02/18/2024 1103   PROT 6.5 02/18/2024 1103   ALBUMIN 4.0 02/18/2024 1103   AST 15 02/18/2024 1103   ALT 10 02/18/2024 1103   ALKPHOS 145 (H) 02/18/2024 1103   BILITOT 0.3 02/18/2024 1103   GFRNONAA 46 (L) 02/03/2023 1344   GFRAA 97 03/07/2020 1031   Lab Results  Component Value Date   CHOL 149 02/18/2024   HDL 59 02/18/2024   LDLCALC 58 02/18/2024   TRIG 200 (H) 02/18/2024   CHOLHDL 2.5 02/18/2024   Lab Results  Component Value Date   HGBA1C 5.8 (H) 02/18/2024   Lab Results  Component Value Date   VITAMINB12 207 (L) 02/18/2024   Lab Results  Component Value Date   TSH 1.140 07/22/2022      ASSESSMENT AND PLAN  68 y.o. 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  MoCA testing today is within normal limits.  Dx:  1. Normal memory function     PLAN:  Normal cognitive ability (MoCA 27/30) - normal memory testing; patient denies any memory complaints; no concern for underlying  neurologic disorder - continue B12 replacement - try to stay active physically and get some exercise (at least 15-30 minutes per day) - eat a nutritious diet with lean protein, plants / vegetables, whole grains; avoid ultra-processed foods - increase social activities, brain stimulation, games, puzzles, hobbies, crafts, arts, music; try new activities; keep it fun! - aim for at least 7-8 hours sleep per night (or more) - avoid smoking and alcohol  Return for return to PCP, pending if symptoms worsen or fail to improve.    EDUARD FABIENE HANLON, MD 03/20/2024, 11:32 AM Certified in Neurology, Neurophysiology and Neuroimaging  [facility] [address]  [address] [place], [place] [address] [phone]

## 2024-03-20 NOTE — Patient Instructions (Addendum)
  Normal cognitive ability (MoCA 27/30) - normal memory testing; patient denies any memory complaints; no concern for underlying neurologic disorder - continue B12 replacement - try to stay active physically and get some exercise (at least 15-30 minutes per day) - eat a nutritious diet with lean protein, plants / vegetables, whole grains; avoid ultra-processed foods - increase social activities, brain stimulation, games, puzzles, hobbies, crafts, arts, music; try new activities; keep it fun! - aim for at least 7-8 hours sleep per night (or more) - avoid smoking and alcohol

## 2024-03-23 ENCOUNTER — Telehealth: Payer: Self-pay | Admitting: Nurse Practitioner

## 2024-03-23 NOTE — Telephone Encounter (Signed)
 Accomodation form completed. Faxed to [facility]; [phone]. Notified patient. She will p/u @ front desk. Scanned-Toni

## 2024-03-30 ENCOUNTER — Ambulatory Visit
Admission: RE | Admit: 2024-03-30 | Discharge: 2024-03-30 | Disposition: A | Source: Ambulatory Visit | Attending: Nurse Practitioner

## 2024-03-30 ENCOUNTER — Other Ambulatory Visit: Payer: Self-pay | Admitting: Nurse Practitioner

## 2024-03-30 ENCOUNTER — Ambulatory Visit
Admission: RE | Admit: 2024-03-30 | Discharge: 2024-03-30 | Disposition: A | Discharge status: Home / Self Care | Source: Ambulatory Visit | Attending: Nurse Practitioner | Admitting: Nurse Practitioner

## 2024-03-30 DIAGNOSIS — Z1231 Encounter for screening mammogram for malignant neoplasm of breast: Secondary | ICD-10-CM | POA: Diagnosis present

## 2024-03-30 DIAGNOSIS — E2839 Other primary ovarian failure: Secondary | ICD-10-CM | POA: Insufficient documentation

## 2024-03-30 DIAGNOSIS — N6321 Unspecified lump in the left breast, upper outer quadrant: Secondary | ICD-10-CM

## 2024-03-31 ENCOUNTER — Encounter: Payer: Self-pay | Admitting: Nurse Practitioner

## 2024-03-31 ENCOUNTER — Ambulatory Visit: Admitting: Nurse Practitioner

## 2024-03-31 VITALS — BP 130/70 | HR 90 | Temp 96.8°F | Resp 16 | Ht 64.0 in | Wt 207.2 lb

## 2024-03-31 DIAGNOSIS — E559 Vitamin D deficiency, unspecified: Secondary | ICD-10-CM

## 2024-03-31 DIAGNOSIS — M8589 Other specified disorders of bone density and structure, multiple sites: Secondary | ICD-10-CM | POA: Insufficient documentation

## 2024-03-31 DIAGNOSIS — N390 Urinary tract infection, site not specified: Secondary | ICD-10-CM

## 2024-03-31 DIAGNOSIS — F411 Generalized anxiety disorder: Secondary | ICD-10-CM

## 2024-03-31 DIAGNOSIS — R300 Dysuria: Secondary | ICD-10-CM

## 2024-03-31 DIAGNOSIS — E538 Deficiency of other specified B group vitamins: Secondary | ICD-10-CM

## 2024-03-31 DIAGNOSIS — I700 Atherosclerosis of aorta: Secondary | ICD-10-CM

## 2024-03-31 LAB — POCT URINALYSIS DIPSTICK
Bilirubin, UA: NEGATIVE
Glucose, UA: NEGATIVE
Leukocytes, UA: NEGATIVE
Nitrite, UA: NEGATIVE
Protein, UA: NEGATIVE
Spec Grav, UA: <=1.005 — AB (ref 1.010–1.025)
Urobilinogen, UA: 0.2 U/dL
pH, UA: 7 (ref 5.0–8.0)

## 2024-03-31 MED ORDER — PHENAZOPYRIDINE HCL 200 MG PO TABS: 200.0000 mg | ORAL_TABLET | Freq: Three times a day (TID) | ORAL | 0 refills | Status: DC | PRN

## 2024-03-31 MED ORDER — NITROFURANTOIN MONOHYD MACRO 100 MG PO CAPS: 100.0000 mg | ORAL_CAPSULE | Freq: Two times a day (BID) | ORAL | 0 refills | Status: AC

## 2024-03-31 MED ORDER — FLUCONAZOLE 150 MG PO TABS: 150.0000 mg | ORAL_TABLET | Freq: Once | ORAL | 0 refills | Status: AC

## 2024-03-31 NOTE — Progress Notes (Signed)
 [facility] [address] [place], [place] [address]  Internal MEDICINE  Office Visit Note  Patient Name: Wendy Hubbard  [id number]  [id number]  Date of Service: 03/31/2024  Chief Complaint  Patient presents with   Follow-up    Lump on breast    HPI Sabiha presents for a follow-up visit for depression, anxiety, possible UTI,  Depression -- depressed mood has improved some since taking a leave of absence and increasing her duloxetine  dose. She is sleeping a little better with trazodone  as well.  Low vitamin D  Low vitamin B12 UTI symptoms -- urgency, bad urine odor, burning with urination, and frequency. Urinalysis in office was positive for trace blood. Negative for leukocytes and nitrites.  Patient was seen by neurology for forgetfulness and she was determined to have normal cognitive function and normal memory functions. She did not have any deficits or any abnormal amount of forgetfulness.   Fibrosis 4 Score = .74 Fib-4 interpretation is not validated for people under 35 or over 36 years of age. However, scores under 2.0 are generally considered low risk.   The 10-year ASCVD risk score (Arnett DK, et al., 2019) is: 17.1%   Values used to calculate the score:     Age: 73 years     Clincally relevant sex: Female     Is Non-Hispanic African American: No     Diabetic: Yes     Tobacco smoker: No     Systolic Blood Pressure: 130 mmHg     Is BP treated: Yes     HDL Cholesterol: 59 mg/dL     Total Cholesterol: 149 mg/dL    Current Medication: Outpatient Encounter Medications as of 03/31/2024  Medication Sig   cyanocobalamin  (VITAMIN B12) 1000 MCG/ML injection Per month ( pt needs syringes with needles as well ) self administered, administer 1 ml IM once a week x3 doses then decrease to monthly x5 more doses.   fluconazole  (DIFLUCAN ) 150 MG tablet Take 1 tablet (150 mg total) by mouth once for 1 dose. May take an additional dose after 3 days if still symptomatic.    nitrofurantoin , macrocrystal-monohydrate, (MACROBID ) 100 MG capsule Take 1 capsule (100 mg total) by mouth 2 (two) times daily for 7 days. Take one tab po bid x 10 days then one tab M/W/F until all medicine is finished   phenazopyridine  (PYRIDIUM ) 200 MG tablet Take 1 tablet (200 mg total) by mouth 3 (three) times daily as needed for pain.   alosetron  (LOTRONEX ) 1 MG tablet Take 1 tablet (1 mg total) by mouth 2 (two) times daily.   ALPRAZolam  (XANAX ) 0.5 MG tablet Take 0.5-1 tablets (0.25-0.5 mg total) by mouth 2 (two) times daily as needed for anxiety.   Blood Glucose Monitoring Suppl DEVI 1 each by Does not apply route in the morning, at noon, and at bedtime. May substitute to any manufacturer covered by patient's insurance.   cilostazol  (PLETAL ) 100 MG tablet Take 1 tablet (100 mg total) by mouth 2 (two) times daily.   desloratadine  (CLARINEX ) 5 MG tablet Take 1 tablet (5 mg total) by mouth daily.   DULoxetine  (CYMBALTA ) 30 MG capsule Take 1 capsule (30 mg total) by mouth daily.   furosemide  (LASIX ) 20 MG tablet Take 2 tablets (40 mg total) by mouth daily.   Glucose Blood (BLOOD GLUCOSE TEST STRIPS) STRP 1 each by In Vitro route daily. May substitute to any manufacturer covered by patient's insurance.   Lancet Device MISC 1 each by Does not  apply route daily. May substitute to any manufacturer covered by patient's insurance.   Lancets Misc. MISC 1 each by Does not apply route daily. May substitute to any manufacturer covered by patient's insurance.   loperamide  (IMODIUM ) 2 MG capsule Take 4 mg po once. Repeat with 2mg  po after each loose stool. Max dose is 16 mg/day   methocarbamol  (ROBAXIN ) 500 MG tablet TAKE 2 TABLETS BY MOUTH EVERY 6 HOURS ASNEEDED FOR MUSCLE SPASMS   metoprolol  succinate (TOPROL -XL) 50 MG 24 hr tablet Take 1 tablet (50 mg total) by mouth daily.   montelukast  (SINGULAIR ) 10 MG tablet Take 1 tablet (10 mg total) by mouth at bedtime.   naloxone (NARCAN) nasal spray 4 mg/0.1 mL  Spray the contents of 1 device into 1 nostril. Call 911. May repeat with 2nd device in alternate nostril if no response in 2-3 minutes. (Patient not taking: Reported on 03/20/2024)   [START ON 04/18/2024] oxyCODONE  (OXY IR/ROXICODONE ) 5 MG immediate release tablet Take 1-2 tablets (5-10 mg total) by mouth daily as needed for moderate pain (pain score 4-6) or severe pain (pain score 7-10).   oxyCODONE  (OXY IR/ROXICODONE ) 5 MG immediate release tablet Take 1-2 tablets (5-10 mg total) by mouth daily as needed for moderate pain (pain score 4-6) or severe pain (pain score 7-10).   oxyCODONE  (OXY IR/ROXICODONE ) 5 MG immediate release tablet Take 1-2 tablets (5-10 mg total) by mouth daily as needed for moderate pain (pain score 4-6) or severe pain (pain score 7-10).   pantoprazole  (PROTONIX ) 40 MG tablet TAKE 1 TABLET BY MOUTH ONCE DAILY   pregabalin  (LYRICA ) 50 MG capsule Take 1 capsule (50 mg total) by mouth 2 (two) times daily.   rOPINIRole  (REQUIP ) 2 MG tablet Take 1 tablet (2 mg total) by mouth at bedtime.   rosuvastatin  (CRESTOR ) 10 MG tablet Take 1 tablet (10 mg total) by mouth daily.   Specialty Vitamins Products (HAIR BOOSTER PO) Take 2 capsules by mouth daily. Moerie Ultimate Hair Boost Supplement (hyaluronic acid/vitamin b7/vitamin b9/vitamin b12/vitamin b6/zinc /selenium/copper /vitamin e)   Syringe/Needle, Disp, (SYRINGE 3CC/25GX5/8) 25G X 5/8 3 ML MISC Please use 1 needle and syringe to draw up medication and then replace the needle with a new needle from a new syringe. For B12 injections to be done at home.   traZODone  (DESYREL ) 50 MG tablet Take 0.5-1 tablets (25-50 mg total) by mouth at bedtime.   triamcinolone  (NASACORT ) 55 MCG/ACT AERO nasal inhaler Place 2 sprays into the nose daily.   valsartan  (DIOVAN ) 80 MG tablet Take 1 tablet (80 mg total) by mouth daily.   Vitamin D , Ergocalciferol , (DRISDOL ) 1.25 MG (50000 UNIT) CAPS capsule Take 1 capsule (50,000 Units total) by mouth every 7 (seven)  days. (Patient not taking: Reported on 03/20/2024)   No facility-administered encounter medications on file as of 03/31/2024.    Surgical History: Past Surgical History:  Procedure Laterality Date   ABDOMINAL HYSTERECTOMY     ANKLE ARTHROSCOPY Bilateral    APPENDECTOMY     BLADDER SUSPENSION     BREAST EXCISIONAL BIOPSY Left 1990   neg   BREAST EXCISIONAL BIOPSY Right 1989   neg   BREAST SURGERY     CHOLECYSTECTOMY     COLONOSCOPY N/A 11/04/2015   Procedure: COLONOSCOPY;  Surgeon: Lamar ONEIDA Holmes, MD;  Location: [facility] ENDOSCOPY;  Service: Endoscopy;  Laterality: N/A;   COLONOSCOPY WITH PROPOFOL  N/A 08/30/2020   Procedure: COLONOSCOPY WITH PROPOFOL ;  Surgeon: Jinny Carmine, MD;  Location: [facility] SURGERY CNTR;  Service: Endoscopy;  Laterality: N/A;   ESOPHAGOGASTRODUODENOSCOPY N/A 11/02/2015   Procedure: ESOPHAGOGASTRODUODENOSCOPY (EGD);  Surgeon: Lamar ONEIDA Holmes, MD;  Location: [facility] ENDOSCOPY;  Service: Endoscopy;  Laterality: N/A;   ESOPHAGOGASTRODUODENOSCOPY (EGD) WITH PROPOFOL  N/A 03/16/2022   Procedure: ESOPHAGOGASTRODUODENOSCOPY (EGD) WITH PROPOFOL  foregin body removal;  Surgeon: Jinny Carmine, MD;  Location: [facility] SURGERY [facility]TR;  Service: Endoscopy;  Laterality: N/A;  Diabetic   GASTROJEJUNOSTOMY N/A 08/25/2021   Procedure: LAPAROSCOPIC REDO OF STOMACH TO INTESTINE;  Surgeon: Stevie, Herlene Righter, MD;  Location: [facility] ORS;  Service: General;  Laterality: N/A;   KNEE ARTHROSCOPY Left    KNEE ARTHROSCOPY Right 2025   september   PARTIAL GASTRECTOMY N/A 08/25/2021   Procedure: PARTIAL GASTRECTOMY;  Surgeon: Stevie Herlene Righter, MD;  Location: [facility] ORS;  Service: General;  Laterality: N/A;   POLYPECTOMY  08/30/2020   Procedure: POLYPECTOMY;  Surgeon: Jinny Carmine, MD;  Location: [facility] SURGERY [facility]TR;  Service: Endoscopy;;   REDUCTION MAMMAPLASTY Bilateral 1988   TONSILLECTOMY     TOTAL KNEE ARTHROPLASTY Left 02/15/2023   Procedure: TOTAL KNEE ARTHROPLASTY;  Surgeon: Rubie Kemps,  MD;  Location: [facility] ORS;  Service: Orthopedics;  Laterality: Left;   UPPER GI ENDOSCOPY  08/25/2021   Procedure: UPPER GI ENDOSCOPY;  Surgeon: Stevie, Herlene Righter, MD;  Location: [facility] ORS;  Service: General;;    Medical History: Past Medical History:  Diagnosis Date   Allergy    Phreesia 07/23/2020   Anxiety    Phreesia 07/23/2020   COPD (chronic obstructive pulmonary disease) (HCC)    DDD (degenerative disc disease), thoracic    Depression    Phreesia 07/23/2020   Diabetes mellitus without complication (HCC)    type 2   GERD (gastroesophageal reflux disease)    Hyperlipidemia    Nausea and vomiting 03/04/2022   Neuromuscular disorder (HCC)    severe neuropathy feet   Pneumonia    Rapid heart rate    Sleep apnea    no longer uses CPAP due to weight loss   TMJ (temporomandibular joint disorder) 11/28/2022   Wears contact lenses     Family History: Family History  Problem Relation Age of Onset   Bladder Cancer Mother    High Cholesterol Mother    High blood pressure Mother    Cancer Mother    Dementia Mother    Heart attack Father    High blood pressure Father    Hypertension Other    Kidney cancer Neg Hx    Prostate cancer Neg Hx    Breast cancer Neg Hx     Social History   Socioeconomic History   Marital status: Married    Spouse name: Not on file   Number of children: Not on file   Years of education: Not on file   Highest education level: Not on file  Occupational History   Not on file  Tobacco Use   Smoking status: Former    Current packs/day: 0.00    Types: Cigarettes    Start date: 78    Quit date: 1996    Years since quitting: 29.9    Passive exposure: Past   Smokeless tobacco: Never   Tobacco comments:    quit in 1996  Vaping Use   Vaping status: Never Used  Substance and Sexual Activity   Alcohol use: Not Currently    Alcohol/week: 2.0 standard drinks of alcohol    Types: 1 Glasses of wine, 1 Standard drinks or equivalent per week     Comment: occasional  social  Drug use: No   Sexual activity: Yes    Partners: Male    Birth control/protection: Surgical, Other-see comments  Other Topics Concern   Not on file  Social History Narrative   2 cups of Caffeine  daily    Social Drivers of Health   Financial Resource Strain: Low Risk  (02/02/2023)   Overall Financial Resource Strain (CARDIA)    Difficulty of Paying Living Expenses: Not hard at all  Food Insecurity: No Food Insecurity (02/15/2023)   Hunger Vital Sign    Worried About Running Out of Food in the Last Year: Never true    Ran Out of Food in the Last Year: Never true  Transportation Needs: No Transportation Needs (02/15/2023)   PRAPARE - Administrator, Civil Service (Medical): No    Lack of Transportation (Non-Medical): No  Physical Activity: Inactive (02/02/2023)   Exercise Vital Sign    Days of Exercise per Week: 0 days    Minutes of Exercise per Session: 0 min  Stress: No Stress Concern Present (02/02/2023)   [employer] of Occupational Health - Occupational Stress Questionnaire    Feeling of Stress : Not at all  Social Connections: Socially Integrated (02/02/2023)   Social Connection and Isolation Panel    Frequency of Communication with Friends and Family: More than three times a week    Frequency of Social Gatherings with Friends and Family: More than three times a week    Attends Religious Services: More than 4 times per year    Active Member of [employer] or Organizations: Yes    Attends Engineer, Structural: More than 4 times per year    Marital Status: Married  Catering Manager Violence: Not At Risk (02/15/2023)   Humiliation, Afraid, Rape, and Kick questionnaire    Fear of Current or Ex-Partner: No    Emotionally Abused: No    Physically Abused: No    Sexually Abused: No      Review of Systems  Constitutional:  Positive for fatigue and unexpected weight change. Negative for activity change, appetite change, chills and  fever.  HENT: Negative.  Negative for congestion, ear pain, rhinorrhea, sore throat and trouble swallowing.   Eyes: Negative.   Respiratory: Negative.  Negative for cough, chest tightness, shortness of breath and wheezing.   Cardiovascular: Negative.  Negative for chest pain.  Gastrointestinal: Negative.  Negative for abdominal pain, blood in stool, constipation, diarrhea, nausea and vomiting.  Endocrine: Negative.   Genitourinary: Negative.  Negative for difficulty urinating, dysuria, frequency, hematuria and urgency.  Musculoskeletal: Negative.  Negative for arthralgias, back pain, joint swelling, myalgias and neck pain.  Skin: Negative.  Negative for rash and wound.  Allergic/Immunologic: Negative.  Negative for immunocompromised state.  Neurological: Negative.  Negative for dizziness, seizures, numbness and headaches.  Hematological: Negative.   Psychiatric/Behavioral:  Positive for behavioral problems, decreased concentration and sleep disturbance. Negative for self-injury and suicidal ideas. The patient is nervous/anxious.     Vital Signs: BP 130/70   Pulse 90   Temp (!) 96.8 F (36 C)   Resp 16   Ht 5' 4 (1.626 m)   Wt 207 lb 3.2 oz (94 kg)   SpO2 96%   BMI 35.57 kg/m    Physical Exam Vitals reviewed.  Constitutional:      General: She is not in acute distress.    Appearance: Normal appearance. She is obese. She is not ill-appearing.  HENT:     Head: Normocephalic and atraumatic.  Eyes:     Pupils: Pupils are equal, round, and reactive to light.  Cardiovascular:     Rate and Rhythm: Normal rate and regular rhythm.  Pulmonary:     Effort: Pulmonary effort is normal. No respiratory distress.  Skin:    Capillary Refill: Capillary refill takes less than 2 seconds.  Neurological:     Mental Status: She is alert and oriented to person, place, and time.  Psychiatric:        Mood and Affect: Mood normal.        Behavior: Behavior normal.         Assessment/Plan: 1. Urinary tract infection with hematuria, site unspecified (Primary) Urinalysis is abnormal, urine sent to lab for culture. Nitrofurantoin  prescribed, take until gone. Fluconazole  prescribed and pyridium  for urinary tract pain - CULTURE, URINE COMPREHENSIVE - nitrofurantoin , macrocrystal-monohydrate, (MACROBID ) 100 MG capsule; Take 1 capsule (100 mg total) by mouth 2 (two) times daily for 7 days. Dispense: 14 capsule; Refill: 0 - phenazopyridine  (PYRIDIUM ) 200 MG tablet; Take 1 tablet (200 mg total) by mouth 3 (three) times daily as needed for pain.  Dispense: 15 tablet; Refill: 0 - fluconazole  (DIFLUCAN ) 150 MG tablet; Take 1 tablet (150 mg total) by mouth once for 1 dose. May take an additional dose after 3 days if still symptomatic.  Dispense: 3 tablet; Refill: 0  2. Osteopenia of multiple sites Dexa scan discussed, she has osteopenia, take OTC calcium   supplement 600 mg daily. Continue weight bearing exercise.  3. Aortic atherosclerosis Continue rosuvastatin  as prescribed.   4. B12 deficiency Continue B12 supplement   5. Vitamin D  deficiency Continue weekly vitamin D  supplement as prescribed.   6. Dysuria Urinalysis is abnormal, urine sent for culture - POCT urinalysis dipstick  7. Moderate episode of recurrent major depressive disorder (HCC) Continue duloxetine  and trazodone  as prescribed.   8. Generalized anxiety disorder Continue duloxetine  and prn alprazolam  as prescribed.     General Counseling: Kasie verbalizes understanding of the findings of todays visit and agrees with plan of treatment. I have discussed any further diagnostic evaluation that may be needed or ordered today. We also reviewed her medications today. she has been encouraged to call the office with any questions or concerns that should arise related to todays visit.    Orders Placed This Encounter  Procedures   CULTURE, URINE COMPREHENSIVE   POCT urinalysis dipstick     Meds ordered this encounter  Medications   nitrofurantoin , macrocrystal-monohydrate, (MACROBID ) 100 MG capsule    Sig: Take 1 capsule (100 mg total) by mouth 2 (two) times daily for 7 days. Take one tab po bid x 10 days then one tab M/W/F until all medicine is finished    Dispense:  14 capsule    Refill:  0    Fill new script today   phenazopyridine  (PYRIDIUM ) 200 MG tablet    Sig: Take 1 tablet (200 mg total) by mouth 3 (three) times daily as needed for pain.    Dispense:  15 tablet    Refill:  0    Fill new script today   fluconazole  (DIFLUCAN ) 150 MG tablet    Sig: Take 1 tablet (150 mg total) by mouth once for 1 dose. May take an additional dose after 3 days if still symptomatic.    Dispense:  3 tablet    Refill:  0    Fill new script today    Return in about 2 months (around 05/31/2024) for F/U, Aldahir Litaker PCP, med refill.  Total time spent:30 Minutes Time spent includes review of chart, medications, test results, and follow up plan with the patient.   [place] Controlled Substance Database was reviewed by me.  This patient was seen by Mardy Maxin, FNP-C in collaboration with Dr. Sigrid Bathe as a part of collaborative care agreement.   Nasim Habeeb R. Maxin, MSN, FNP-C Internal medicine

## 2024-04-02 ENCOUNTER — Encounter: Payer: Self-pay | Admitting: Nurse Practitioner

## 2024-04-03 ENCOUNTER — Encounter: Payer: Self-pay | Admitting: Nurse Practitioner

## 2024-04-03 DIAGNOSIS — M4316 Spondylolisthesis, lumbar region: Secondary | ICD-10-CM

## 2024-04-03 DIAGNOSIS — R10A1 Flank pain, right side: Secondary | ICD-10-CM

## 2024-04-03 DIAGNOSIS — M25551 Pain in right hip: Secondary | ICD-10-CM

## 2024-04-03 LAB — CULTURE, URINE COMPREHENSIVE

## 2024-04-04 ENCOUNTER — Telehealth: Payer: Self-pay | Admitting: Nurse Practitioner

## 2024-04-04 ENCOUNTER — Ambulatory Visit: Payer: Self-pay | Admitting: Nurse Practitioner

## 2024-04-04 NOTE — Telephone Encounter (Signed)
 Disability paperwork completed and faxed to [employer]; [phone]. Scanned-Toni

## 2024-04-04 NOTE — Progress Notes (Signed)
 Please let patient know that the culture came back normal and negative for infection. It is possible she could have a kidney stone. I can order a CT stone study to check if she is still having pain

## 2024-04-08 ENCOUNTER — Other Ambulatory Visit: Payer: Self-pay | Admitting: Nurse Practitioner

## 2024-04-08 DIAGNOSIS — I10 Essential (primary) hypertension: Secondary | ICD-10-CM

## 2024-04-10 ENCOUNTER — Encounter

## 2024-04-10 ENCOUNTER — Telehealth: Payer: Self-pay

## 2024-04-10 ENCOUNTER — Other Ambulatory Visit

## 2024-04-10 DIAGNOSIS — R1085 Abdominal pain of multiple sites: Secondary | ICD-10-CM

## 2024-04-10 DIAGNOSIS — R300 Dysuria: Secondary | ICD-10-CM

## 2024-04-12 ENCOUNTER — Other Ambulatory Visit: Payer: Self-pay | Admitting: Medical Genetics

## 2024-04-13 ENCOUNTER — Ambulatory Visit
Admission: RE | Admit: 2024-04-13 | Discharge: 2024-04-13 | Disposition: A | Source: Ambulatory Visit | Attending: Nurse Practitioner

## 2024-04-13 ENCOUNTER — Other Ambulatory Visit

## 2024-04-13 DIAGNOSIS — R300 Dysuria: Secondary | ICD-10-CM

## 2024-04-13 DIAGNOSIS — R1085 Abdominal pain of multiple sites: Secondary | ICD-10-CM

## 2024-04-13 NOTE — Telephone Encounter (Signed)
 Left message on voicemail letting patient know her ct order was placed

## 2024-04-17 ENCOUNTER — Telehealth: Payer: Self-pay | Admitting: Internal Medicine

## 2024-04-17 NOTE — Telephone Encounter (Addendum)
 Received Disability form from [employer]. Gave to Alyssa to complete. All requested records attached-Toni

## 2024-04-26 ENCOUNTER — Telehealth: Payer: Self-pay | Admitting: Nurse Practitioner

## 2024-04-26 ENCOUNTER — Encounter: Payer: Self-pay | Admitting: Nurse Practitioner

## 2024-04-26 NOTE — Telephone Encounter (Signed)
 Disability form completed. Form & office notes faxed back to [employer]; [phone]. Scanned-Toni

## 2024-05-10 ENCOUNTER — Telehealth: Payer: Self-pay | Admitting: Nurse Practitioner

## 2024-05-10 NOTE — Telephone Encounter (Signed)
 Received another disability form for Behavioral Health. Lvm for case manage with [employer] to return my call as to who is to complete form-Toni

## 2024-05-15 ENCOUNTER — Inpatient Hospital Stay: Admission: RE | Admit: 2024-05-15 | Discharge: 2024-05-15 | Attending: Nurse Practitioner

## 2024-05-15 ENCOUNTER — Ambulatory Visit: Admitting: Nurse Practitioner

## 2024-05-15 ENCOUNTER — Encounter: Payer: Self-pay | Admitting: Nurse Practitioner

## 2024-05-15 VITALS — BP 134/70 | HR 102 | Temp 97.5°F | Resp 16 | Ht 64.0 in | Wt 201.6 lb

## 2024-05-15 DIAGNOSIS — I700 Atherosclerosis of aorta: Secondary | ICD-10-CM

## 2024-05-15 DIAGNOSIS — M4316 Spondylolisthesis, lumbar region: Secondary | ICD-10-CM | POA: Diagnosis not present

## 2024-05-15 DIAGNOSIS — E1142 Type 2 diabetes mellitus with diabetic polyneuropathy: Secondary | ICD-10-CM

## 2024-05-15 DIAGNOSIS — I10 Essential (primary) hypertension: Secondary | ICD-10-CM | POA: Diagnosis not present

## 2024-05-15 DIAGNOSIS — F411 Generalized anxiety disorder: Secondary | ICD-10-CM

## 2024-05-15 DIAGNOSIS — F331 Major depressive disorder, recurrent, moderate: Secondary | ICD-10-CM

## 2024-05-15 DIAGNOSIS — G894 Chronic pain syndrome: Secondary | ICD-10-CM

## 2024-05-15 DIAGNOSIS — L659 Nonscarring hair loss, unspecified: Secondary | ICD-10-CM

## 2024-05-15 MED ORDER — OXYCODONE HCL 5 MG PO TABS: 5.0000 mg | ORAL_TABLET | Freq: Every day | ORAL | 0 refills | Status: AC | PRN

## 2024-05-15 MED ORDER — TRAZODONE HCL 50 MG PO TABS: 25.0000 mg | ORAL_TABLET | Freq: Every day | ORAL | 3 refills | Status: AC

## 2024-05-15 MED ORDER — ALPRAZOLAM 0.5 MG PO TABS: 0.2500 mg | ORAL_TABLET | Freq: Two times a day (BID) | ORAL | 1 refills | Status: AC | PRN

## 2024-05-15 MED ORDER — MINOXIDIL 2.5 MG PO TABS: 1.2500 mg | ORAL_TABLET | Freq: Every day | ORAL | 3 refills | Status: AC

## 2024-05-15 MED ORDER — DULOXETINE HCL 60 MG PO CPEP: 60.0000 mg | ORAL_CAPSULE | Freq: Every day | ORAL | 3 refills | Status: AC

## 2024-05-15 NOTE — Progress Notes (Signed)
 [facility] [address] [place], [place] [address]  Internal MEDICINE  Office Visit Note  Patient Name: Wendy Hubbard  [id number]  [id number]  Date of Service: 05/15/2024  Chief Complaint  Patient presents with   Diabetes   Depression   Gastroesophageal Reflux   Hyperlipidemia   Follow-up    HPI Wendy Hubbard presents for a follow-up visit for hair loss, depression, anxiety, stress at work, diabetes.  Hair loss -- has been taking 1.25 mg of minoxidil  daily which has increase her hair growth.  Depression -- currently on duloxetine  30 mg daily  Anxiety -- taking duloxetine  30 mg daily and prn alprazolam   Stress from work -- on STD from work right now, additional paperwork was received last week.  Diabetes -- ozempic  is too expensive with insurance, will switch to trulicity.     Current Medication: Outpatient Encounter Medications as of 05/15/2024  Medication Sig   DULoxetine  (CYMBALTA ) 60 MG capsule Take 1 capsule (60 mg total) by mouth daily.   minoxidil  (LONITEN ) 2.5 MG tablet Take 0.5 tablets (1.25 mg total) by mouth daily.   alosetron  (LOTRONEX ) 1 MG tablet Take 1 tablet (1 mg total) by mouth 2 (two) times daily.   ALPRAZolam  (XANAX ) 0.5 MG tablet Take 0.5-1 tablets (0.25-0.5 mg total) by mouth 2 (two) times daily as needed for anxiety.   Blood Glucose Monitoring Suppl DEVI 1 each by Does not apply route in the morning, at noon, and at bedtime. May substitute to any manufacturer covered by patient's insurance.   cilostazol  (PLETAL ) 100 MG tablet Take 1 tablet (100 mg total) by mouth 2 (two) times daily.   cyanocobalamin  (VITAMIN B12) 1000 MCG/ML injection Per month ( pt needs syringes with needles as well ) self administered, administer 1 ml IM once a week x3 doses then decrease to monthly x5 more doses.   desloratadine  (CLARINEX ) 5 MG tablet Take 1 tablet (5 mg total) by mouth daily.   furosemide  (LASIX ) 20 MG tablet TAKE 2 TABLETS BY MOUTH ONCE DAILY   Glucose Blood  (BLOOD GLUCOSE TEST STRIPS) STRP 1 each by In Vitro route daily. May substitute to any manufacturer covered by patient's insurance.   Lancet Device MISC 1 each by Does not apply route daily. May substitute to any manufacturer covered by patient's insurance.   Lancets Misc. MISC 1 each by Does not apply route daily. May substitute to any manufacturer covered by patient's insurance.   loperamide  (IMODIUM ) 2 MG capsule Take 4 mg po once. Repeat with 2mg  po after each loose stool. Max dose is 16 mg/day   methocarbamol  (ROBAXIN ) 500 MG tablet TAKE 2 TABLETS BY MOUTH EVERY 6 HOURS ASNEEDED FOR MUSCLE SPASMS   metoprolol  succinate (TOPROL -XL) 50 MG 24 hr tablet Take 1 tablet (50 mg total) by mouth daily.   montelukast  (SINGULAIR ) 10 MG tablet Take 1 tablet (10 mg total) by mouth at bedtime.   naloxone (NARCAN) nasal spray 4 mg/0.1 mL Spray the contents of 1 device into 1 nostril. Call 911. May repeat with 2nd device in alternate nostril if no response in 2-3 minutes. (Patient not taking: Reported on 03/20/2024)   [START ON 07/10/2024] oxyCODONE  (OXY IR/ROXICODONE ) 5 MG immediate release tablet Take 1-2 tablets (5-10 mg total) by mouth daily as needed for moderate pain (pain score 4-6) or severe pain (pain score 7-10).   [START ON 06/12/2024] oxyCODONE  (OXY IR/ROXICODONE ) 5 MG immediate release tablet Take 1-2 tablets (5-10 mg total) by mouth daily as needed for moderate  pain (pain score 4-6) or severe pain (pain score 7-10).   oxyCODONE  (OXY IR/ROXICODONE ) 5 MG immediate release tablet Take 1-2 tablets (5-10 mg total) by mouth daily as needed for moderate pain (pain score 4-6) or severe pain (pain score 7-10).   pantoprazole  (PROTONIX ) 40 MG tablet TAKE 1 TABLET BY MOUTH ONCE DAILY   pregabalin  (LYRICA ) 50 MG capsule Take 1 capsule (50 mg total) by mouth 2 (two) times daily.   rOPINIRole  (REQUIP ) 2 MG tablet Take 1 tablet (2 mg total) by mouth at bedtime.   rosuvastatin  (CRESTOR ) 10 MG tablet Take 1 tablet (10 mg  total) by mouth daily.   Syringe/Needle, Disp, (SYRINGE 3CC/25GX5/8) 25G X 5/8 3 ML MISC Please use 1 needle and syringe to draw up medication and then replace the needle with a new needle from a new syringe. For B12 injections to be done at home.   traZODone  (DESYREL ) 50 MG tablet Take 0.5-1 tablets (25-50 mg total) by mouth at bedtime.   triamcinolone  (NASACORT ) 55 MCG/ACT AERO nasal inhaler Place 2 sprays into the nose daily.   valsartan  (DIOVAN ) 80 MG tablet Take 1 tablet (80 mg total) by mouth daily.   Vitamin D , Ergocalciferol , (DRISDOL ) 1.25 MG (50000 UNIT) CAPS capsule Take 1 capsule (50,000 Units total) by mouth every 7 (seven) days. (Patient not taking: Reported on 05/15/2024)   [DISCONTINUED] ALPRAZolam  (XANAX ) 0.5 MG tablet Take 0.5-1 tablets (0.25-0.5 mg total) by mouth 2 (two) times daily as needed for anxiety.   [DISCONTINUED] DULoxetine  (CYMBALTA ) 30 MG capsule Take 1 capsule (30 mg total) by mouth daily.   [DISCONTINUED] oxyCODONE  (OXY IR/ROXICODONE ) 5 MG immediate release tablet Take 1-2 tablets (5-10 mg total) by mouth daily as needed for moderate pain (pain score 4-6) or severe pain (pain score 7-10).   [DISCONTINUED] oxyCODONE  (OXY IR/ROXICODONE ) 5 MG immediate release tablet Take 1-2 tablets (5-10 mg total) by mouth daily as needed for moderate pain (pain score 4-6) or severe pain (pain score 7-10).   [DISCONTINUED] oxyCODONE  (OXY IR/ROXICODONE ) 5 MG immediate release tablet Take 1-2 tablets (5-10 mg total) by mouth daily as needed for moderate pain (pain score 4-6) or severe pain (pain score 7-10).   [DISCONTINUED] phenazopyridine  (PYRIDIUM ) 200 MG tablet Take 1 tablet (200 mg total) by mouth 3 (three) times daily as needed for pain. (Patient not taking: Reported on 05/15/2024)   [DISCONTINUED] Specialty Vitamins Products (HAIR BOOSTER PO) Take 2 capsules by mouth daily. Moerie Ultimate Hair Boost Supplement (hyaluronic acid/vitamin b7/vitamin b9/vitamin b12/vitamin  b6/zinc /selenium/copper /vitamin e) (Patient not taking: Reported on 05/15/2024)   [DISCONTINUED] traZODone  (DESYREL ) 50 MG tablet Take 0.5-1 tablets (25-50 mg total) by mouth at bedtime.   No facility-administered encounter medications on file as of 05/15/2024.    Surgical History: Past Surgical History:  Procedure Laterality Date   ABDOMINAL HYSTERECTOMY     ANKLE ARTHROSCOPY Bilateral    APPENDECTOMY     BLADDER SUSPENSION     BREAST EXCISIONAL BIOPSY Left 1990   neg   BREAST EXCISIONAL BIOPSY Right 1989   neg   BREAST SURGERY     CHOLECYSTECTOMY     COLONOSCOPY N/A 11/04/2015   Procedure: COLONOSCOPY;  Surgeon: Lamar ONEIDA Holmes, MD;  Location: [facility] END[facility]COPY;  Service: Endoscopy;  Laterality: N/A;   COLONOSCOPY WITH PROPOFOL  N/A 08/30/2020   Procedure: COLONOSCOPY WITH PROPOFOL ;  Surgeon: Jinny Carmine, MD;  Location: [facility];  Service: Endoscopy;  Laterality: N/A;   ESOPHAGOGASTRODUODENOSCOPY N/A 11/02/2015   Procedure: ESOPHAGOGASTRODUODENOSCOPY (EGD);  Surgeon:  Lamar ONEIDA Holmes, MD;  Location: [facility] ENDOSCOPY;  Service: Endoscopy;  Laterality: N/A;   ESOPHAGOGASTRODUODENOSCOPY (EGD) WITH PROPOFOL  N/A 03/16/2022   Procedure: ESOPHAGOGASTRODUODENOSCOPY (EGD) WITH PROPOFOL  foregin body removal;  Surgeon: Jinny Carmine, MD;  Location: [facility];  Service: Endoscopy;  Laterality: N/A;  Diabetic   GASTROJEJUNOSTOMY N/A 08/25/2021   Procedure: LAPAROSCOPIC REDO OF STOMACH TO INTESTINE;  Surgeon: Stevie, Herlene Righter, MD;  Location: [facility] ORS;  Service: General;  Laterality: N/A;   KNEE ARTHROSCOPY Left    KNEE ARTHROSCOPY Right 2025   september   PARTIAL GASTRECTOMY N/A 08/25/2021   Procedure: PARTIAL GASTRECTOMY;  Surgeon: Stevie Herlene Righter, MD;  Location: [facility] OR[facility];  Service: General;  Laterality: N/A;   POLYPECTOMY  08/30/2020   Procedure: POLYPECTOMY;  Surgeon: Jinny Carmine, MD;  Location: [facility];  Service: Endoscopy;;   REDUCTION MAMMAPLASTY  Bilateral 1988   TONSILLECTOMY     TOTAL KNEE ARTHROPLASTY Left 02/15/2023   Procedure: TOTAL KNEE ARTHROPLASTY;  Surgeon: Rubie Kemps, MD;  Location: [facility] ORS;  Service: Orthopedics;  Laterality: Left;   UPPER GI ENDOSCOPY  08/25/2021   Procedure: UPPER GI ENDOSCOPY;  Surgeon: Stevie, Herlene Righter, MD;  Location: [facility] OR[facility];  Service: General;;    Medical History: Past Medical History:  Diagnosis Date   Allergy    Phreesia 07/23/2020   Anxiety    Phreesia 07/23/2020   COPD (chronic obstructive pulmonary disease) (HCC)    DDD (degenerative disc disease), thoracic    Depression    Phreesia 07/23/2020   Diabetes mellitus without complication (HCC)    type 2   GERD (gastroesophageal reflux disease)    Hyperlipidemia    Nausea and vomiting 03/04/2022   Neuromuscular disorder (HCC)    severe neuropathy feet   Pneumonia    Rapid heart rate    Sleep apnea    no longer uses CPAP due to weight loss   TMJ (temporomandibular joint disorder) 11/28/2022   Wears contact lenses     Family History: Family History  Problem Relation Age of Onset   Bladder Cancer Mother    High Cholesterol Mother    High blood pressure Mother    Cancer Mother    Dementia Mother    Heart attack Father    High blood pressure Father    Hypertension Other    Kidney cancer Neg Hx    Prostate cancer Neg Hx    Breast cancer Neg Hx     Social History   Socioeconomic History   Marital status: Married    Spouse name: Not on file   Number of children: Not on file   Years of education: Not on file   Highest education level: Not on file  Occupational History   Not on file  Tobacco Use   Smoking status: Former    Current packs/day: 0.00    Types: Cigarettes    Start date: 23    Quit date: 1996    Years since quitting: 30.0    Passive exposure: Past   Smokeless tobacco: Never   Tobacco comments:    quit in 1996  Vaping Use   Vaping status: Never Used  Substance and Sexual Activity   Alcohol use:  Not Currently    Alcohol/week: 2.0 standard drinks of alcohol    Types: 1 Glasses of wine, 1 Standard drinks or equivalent per week    Comment: occasional  social   Drug use: No   Sexual activity: Yes    Partners:  Male    Birth control/protection: Surgical, Other-see comments  Other Topics Concern   Not on file  Social History Narrative   2 cups of Caffeine  daily    Social Drivers of Health   Tobacco Use: Medium Risk (05/15/2024)   Patient History    Smoking Tobacco Use: Former    Smokeless Tobacco Use: Never    Passive Exposure: Past  Physicist, Medical Strain: Low Risk (02/02/2023)   Overall Financial Resource Strain (CARDIA)    Difficulty of Paying Living Expenses: Not hard at all  Food Insecurity: No Food Insecurity (02/15/2023)   Hunger Vital Sign    Worried About Running Out of Food in the Last Year: Never true    Ran Out of Food in the Last Year: Never true  Transportation Needs: No Transportation Needs (02/15/2023)   PRAPARE - Administrator, Civil Service (Medical): No    Lack of Transportation (Non-Medical): No  Physical Activity: Inactive (02/02/2023)   Exercise Vital Sign    Days of Exercise per Week: 0 days    Minutes of Exercise per Session: 0 min  Stress: No Stress Concern Present (02/02/2023)   [employer] of Occupational Health - Occupational Stress Questionnaire    Feeling of Stress : Not at all  Social Connections: Socially Integrated (02/02/2023)   Social Connection and Isolation Panel    Frequency of Communication with Friends and Family: More than three times a week    Frequency of Social Gatherings with Friends and Family: More than three times a week    Attends Religious Services: More than 4 times per year    Active Member of [employer] or Organizations: Yes    Attends Banker Meetings: More than 4 times per year    Marital Status: Married  Catering Manager Violence: Not At Risk (02/15/2023)   Humiliation, Afraid, Rape, and Kick  questionnaire    Fear of Current or Ex-Partner: No    Emotionally Abused: No    Physically Abused: No    Sexually Abused: No  Depression (PHQ2-9): High Risk (02/22/2024)   Depression (PHQ2-9)    PHQ-2 Score: 17  Alcohol Screen: Low Risk (02/02/2023)   Alcohol Screen    Last Alcohol Screening Score (AUDIT): 2  Housing: Unknown (11/18/2023)   Received from [facility]   Epic    Unable to Pay for Housing in the Last Year: Not on file    Number of Times Moved in the Last Year: Not on file    At any time in the past 12 months, were you homeless or living in a shelter (including now)?: No  Utilities: Not At Risk (02/15/2023)   [employer] Utilities    Threatened with loss of utilities: No  Health Literacy: Adequate Health Literacy (02/02/2023)   B1300 Health Literacy    Frequency of need for help with medical instructions: Never      Review of Systems  Constitutional:  Positive for fatigue and unexpected weight change. Negative for activity change, appetite change, chills and fever.  HENT: Negative.  Negative for congestion, ear pain, rhinorrhea, sore throat and trouble swallowing.   Eyes: Negative.   Respiratory: Negative.  Negative for cough, chest tightness, shortness of breath and wheezing.   Cardiovascular: Negative.  Negative for chest pain.  Gastrointestinal: Negative.  Negative for abdominal pain, blood in stool, constipation, diarrhea, nausea and vomiting.  Endocrine: Negative.   Genitourinary: Negative.  Negative for difficulty urinating, dysuria, frequency, hematuria and urgency.  Musculoskeletal:  Negative.  Negative for arthralgias, back pain, joint swelling, myalgias and neck pain.  Skin: Negative.  Negative for rash and wound.  Allergic/Immunologic: Negative.  Negative for immunocompromised state.  Neurological: Negative.  Negative for dizziness, seizures, numbness and headaches.  Hematological: Negative.   Psychiatric/Behavioral:  Positive for behavioral  problems, decreased concentration and sleep disturbance. Negative for self-injury and suicidal ideas. The patient is nervous/anxious.     Vital Signs: BP 134/70   Pulse (!) 102   Temp (!) 97.5 F (36.4 C)   Resp 16   Ht 5' 4 (1.626 m)   Wt 201 lb 9.6 oz (91.4 kg)   SpO2 97%   BMI 34.60 kg/m    Physical Exam Vitals reviewed.  Constitutional:      General: She is not in acute distress.    Appearance: Normal appearance. She is obese. She is not ill-appearing.  HENT:     Head: Normocephalic and atraumatic.  Eyes:     Pupils: Pupils are equal, round, and reactive to light.  Cardiovascular:     Rate and Rhythm: Normal rate and regular rhythm.  Pulmonary:     Effort: Pulmonary effort is normal. No respiratory distress.  Skin:    Capillary Refill: Capillary refill takes less than 2 seconds.  Neurological:     Mental Status: She is alert and oriented to person, place, and time.  Psychiatric:        Mood and Affect: Mood normal.        Behavior: Behavior normal.        Assessment/Plan: 1. Type 2 diabetes mellitus with peripheral neuropathy (HCC) (Primary) Not eligible for PAP with ozempic . Ozempic  is too expensive with her insurance. 1 month sample of ozempic  provided to patient for now and will try to get her approved for PAP program for trulicity instead. If she cannot be approved, will send prescription to [facility] for financial assistance.   2. Essential hypertension Stable, continue medication as prescribed.   3. Aortic atherosclerosis Continue rosuvastatin  as prescribed.   4. Hair loss May continue minoxidil  as prescribed.  - minoxidil  (LONITEN ) 2.5 MG tablet; Take 0.5 tablets (1.25 mg total) by mouth daily.  Dispense: 45 tablet; Refill: 3  5. Chronic pain syndrome Continue oxycodone  as needed as prescribed. Follow up in 3 months for additional refills and UDS due at next visit.  - oxyCODONE  (OXY IR/ROXICODONE ) 5 MG  immediate release tablet; Take 1-2 tablets (5-10 mg total) by mouth daily as needed for moderate pain (pain score 4-6) or severe pain (pain score 7-10).  Dispense: 30 tablet; Refill: 0 - oxyCODONE  (OXY IR/ROXICODONE ) 5 MG immediate release tablet; Take 1-2 tablets (5-10 mg total) by mouth daily as needed for moderate pain (pain score 4-6) or severe pain (pain score 7-10).  Dispense: 30 tablet; Refill: 0 - oxyCODONE  (OXY IR/ROXICODONE ) 5 MG immediate release tablet; Take 1-2 tablets (5-10 mg total) by mouth daily as needed for moderate pain (pain score 4-6) or severe pain (pain score 7-10).  Dispense: 30 tablet; Refill: 0  6. Spondylolisthesis of lumbar region Duloxetine  dose increased to 60 mg daily.  - DULoxetine  (CYMBALTA ) 60 MG capsule; Take 1 capsule (60 mg total) by mouth daily.  Dispense: 30 capsule; Refill: 3  7. Moderate episode of recurrent major depressive disorder (HCC) Duloxetine  dose increased to 60 mg daily and continue trazodone  for sleep.  - traZODone  (DESYREL ) 50 MG tablet; Take 0.5-1 tablets (25-50 mg total) by mouth at bedtime.  Dispense: 30 tablet; Refill: 3 - DULoxetine  (CYMBALTA ) 60 MG capsule; Take 1 capsule (60 mg total) by mouth daily.  Dispense: 30 capsule; Refill: 3  8. Generalized anxiety disorder Duloxetine  dose increased to 60 mg daily. Continue trazodone  for sleep and prn alprazolam  for anxiety.  - ALPRAZolam  (XANAX ) 0.5 MG tablet; Take 0.5-1 tablets (0.25-0.5 mg total) by mouth 2 (two) times daily as needed for anxiety.  Dispense: 60 tablet; Refill: 1 - traZODone  (DESYREL ) 50 MG tablet; Take 0.5-1 tablets (25-50 mg total) by mouth at bedtime.  Dispense: 30 tablet; Refill: 3 - DULoxetine  (CYMBALTA ) 60 MG capsule; Take 1 capsule (60 mg total) by mouth daily.  Dispense: 30 capsule; Refill: 3   General Counseling: Wendy Hubbard verbalizes understanding of the findings of todays visit and agrees with plan of treatment. I have discussed any further diagnostic evaluation that may  be needed or ordered today. We also reviewed her medications today. she has been encouraged to call the office with any questions or concerns that should arise related to todays visit.    No orders of the defined types were placed in this encounter.   Meds ordered this encounter  Medications   minoxidil  (LONITEN ) 2.5 MG tablet    Sig: Take 0.5 tablets (1.25 mg total) by mouth daily.    Dispense:  45 tablet    Refill:  3    Fill new script today   ALPRAZolam  (XANAX ) 0.5 MG tablet    Sig: Take 0.5-1 tablets (0.25-0.5 mg total) by mouth 2 (two) times daily as needed for anxiety.    Dispense:  60 tablet    Refill:  1    Note increase in frequency and number of tablets, fill new script today and discontinue previous alprazolam  order.   oxyCODONE  (OXY IR/ROXICODONE ) 5 MG immediate release tablet    Sig: Take 1-2 tablets (5-10 mg total) by mouth daily as needed for moderate pain (pain score 4-6) or severe pain (pain score 7-10).    Dispense:  30 tablet    Refill:  0    Chronic prescription, Fill for march   oxyCODONE  (OXY IR/ROXICODONE ) 5 MG immediate release tablet    Sig: Take 1-2 tablets (5-10 mg total) by mouth daily as needed for moderate pain (pain score 4-6) or severe pain (pain score 7-10).    Dispense:  30 tablet    Refill:  0    Fill for february   oxyCODONE  (OXY IR/ROXICODONE ) 5 MG immediate release tablet    Sig: Take 1-2 tablets (5-10 mg total) by mouth daily as needed for moderate pain (pain score 4-6) or severe pain (pain score 7-10).    Dispense:  30 tablet    Refill:  0    Chronic prescription, Fill for january   traZODone  (DESYREL ) 50 MG tablet    Sig: Take 0.5-1 tablets (25-50 mg total) by mouth at bedtime.    Dispense:  30 tablet    Refill:  3    Fill new script today. Discontinue mirtazapine .   DULoxetine  (CYMBALTA ) 60 MG capsule    Sig: Take 1 capsule (60 mg total) by mouth daily.    Dispense:  30 capsule    Refill:  3    Fill new script today. Discontinue 30  mg dose.    Return in about 2 months (around 07/26/2024) for F/U, pain med refill, anxiety med refill, Jezebelle Ledwell PCP.   Total time spent:30 Minutes Time spent includes review of chart, medications, test results, and follow up plan  with the patient.   [place] Controlled Substance Database was reviewed by me.  This patient was seen by Mardy Maxin, FNP-C in collaboration with Dr. Sigrid Bathe as a part of collaborative care agreement.   Jacody Beneke R. Maxin, MSN, FNP-C Internal medicine

## 2024-05-16 ENCOUNTER — Telehealth: Payer: Self-pay | Admitting: Nurse Practitioner

## 2024-05-16 NOTE — Telephone Encounter (Signed)
 [facility] does not accept insurance. Psychology referral faxed to [facility]; [phone]. Telephone [phone]. Lvm for patient-Wendy Hubbard

## 2024-05-17 ENCOUNTER — Telehealth: Payer: Self-pay | Admitting: Nurse Practitioner

## 2024-05-17 NOTE — Telephone Encounter (Addendum)
 [employer] disability form given to [employer] & Kiran to be updated with new return to work date-Toni  Updated disability form & 05/15/2024 office note faxed to [facility]; [phone]. Scanned-Toni

## 2024-05-18 ENCOUNTER — Telehealth: Admitting: Nurse Practitioner

## 2024-05-18 ENCOUNTER — Encounter: Payer: Self-pay | Admitting: Nurse Practitioner

## 2024-05-18 VITALS — Resp 16 | Ht 64.0 in | Wt 201.0 lb

## 2024-05-18 DIAGNOSIS — R051 Acute cough: Secondary | ICD-10-CM | POA: Diagnosis not present

## 2024-05-18 DIAGNOSIS — J0100 Acute maxillary sinusitis, unspecified: Secondary | ICD-10-CM | POA: Diagnosis not present

## 2024-05-18 MED ORDER — PROMETHAZINE-DM 6.25-15 MG/5ML PO SYRP: 5.0000 mL | ORAL_SOLUTION | Freq: Four times a day (QID) | ORAL | 0 refills | Status: AC | PRN

## 2024-05-18 MED ORDER — AZITHROMYCIN 250 MG PO TABS: ORAL_TABLET | ORAL | 0 refills | Status: AC

## 2024-05-18 NOTE — Progress Notes (Signed)
 [facility] [address] [place], [place] [address]  Internal MEDICINE  Telephone Visit  Patient Name: Wendy Hubbard  [id number]  [id number]  Date of Service: 05/18/2024  I connected with the patient at 0930 by telephone and verified the patients identity using two identifiers.   I discussed the limitations, risks, security and privacy concerns of performing an evaluation and management service by telephone and the availability of in person appointments. I also discussed with the patient that there may be a patient responsible charge related to the service.  The patient expressed understanding and agrees to proceed.    Chief Complaint  Patient presents with   Telephone Screen    Upper resp infection, yellow/greenish mucus, coughing.    Telephone Assessment    HPI Wendy Hubbard presents for a telehealth virtual visit for possible URI.  Onset of symptoms was yesterday, reports that she now has the respiratory illness that her husband has.  Negative for covid Patient reports cough, yellow/green sputum,    Current Medication: Outpatient Encounter Medications as of 05/18/2024  Medication Sig   azithromycin  (ZITHROMAX ) 250 MG tablet Take 2 tablets on day 1, then 1 tablet daily on days 2 through 5   promethazine -dextromethorphan (PROMETHAZINE -DM) 6.25-15 MG/5ML syrup Take 5 mLs by mouth 4 (four) times daily as needed for cough.   alosetron  (LOTRONEX ) 1 MG tablet Take 1 tablet (1 mg total) by mouth 2 (two) times daily.   ALPRAZolam  (XANAX ) 0.5 MG tablet Take 0.5-1 tablets (0.25-0.5 mg total) by mouth 2 (two) times daily as needed for anxiety.   Blood Glucose Monitoring Suppl DEVI 1 each by Does not apply route in the morning, at noon, and at bedtime. May substitute to any manufacturer covered by patient's insurance.   cilostazol  (PLETAL ) 100 MG tablet Take 1 tablet (100 mg total) by mouth 2 (two) times daily.   cyanocobalamin  (VITAMIN B12) 1000 MCG/ML injection Per month ( pt needs  syringes with needles as well ) self administered, administer 1 ml IM once a week x3 doses then decrease to monthly x5 more doses.   desloratadine  (CLARINEX ) 5 MG tablet Take 1 tablet (5 mg total) by mouth daily.   DULoxetine  (CYMBALTA ) 60 MG capsule Take 1 capsule (60 mg total) by mouth daily.   furosemide  (LASIX ) 20 MG tablet TAKE 2 TABLETS BY MOUTH ONCE DAILY   Glucose Blood (BLOOD GLUCOSE TEST STRIPS) STRP 1 each by In Vitro route daily. May substitute to any manufacturer covered by patient's insurance.   Lancet Device MISC 1 each by Does not apply route daily. May substitute to any manufacturer covered by patient's insurance.   Lancets Misc. MISC 1 each by Does not apply route daily. May substitute to any manufacturer covered by patient's insurance.   loperamide  (IMODIUM ) 2 MG capsule Take 4 mg po once. Repeat with 2mg  po after each loose stool. Max dose is 16 mg/day   methocarbamol  (ROBAXIN ) 500 MG tablet TAKE 2 TABLETS BY MOUTH EVERY 6 HOURS ASNEEDED FOR MUSCLE SPASMS   metoprolol  succinate (TOPROL -XL) 50 MG 24 hr tablet Take 1 tablet (50 mg total) by mouth daily.   minoxidil  (LONITEN ) 2.5 MG tablet Take 0.5 tablets (1.25 mg total) by mouth daily.   montelukast  (SINGULAIR ) 10 MG tablet Take 1 tablet (10 mg total) by mouth at bedtime.   naloxone (NARCAN) nasal spray 4 mg/0.1 mL Spray the contents of 1 device into 1 nostril. Call 911. May repeat with 2nd device in alternate nostril if no response in  2-3 minutes. (Patient not taking: Reported on 03/20/2024)   [START ON 07/10/2024] oxyCODONE  (OXY IR/ROXICODONE ) 5 MG immediate release tablet Take 1-2 tablets (5-10 mg total) by mouth daily as needed for moderate pain (pain score 4-6) or severe pain (pain score 7-10).   [START ON 06/12/2024] oxyCODONE  (OXY IR/ROXICODONE ) 5 MG immediate release tablet Take 1-2 tablets (5-10 mg total) by mouth daily as needed for moderate pain (pain score 4-6) or severe pain (pain score 7-10).   oxyCODONE  (OXY  IR/ROXICODONE ) 5 MG immediate release tablet Take 1-2 tablets (5-10 mg total) by mouth daily as needed for moderate pain (pain score 4-6) or severe pain (pain score 7-10).   pantoprazole  (PROTONIX ) 40 MG tablet TAKE 1 TABLET BY MOUTH ONCE DAILY   pregabalin  (LYRICA ) 50 MG capsule Take 1 capsule (50 mg total) by mouth 2 (two) times daily.   rOPINIRole  (REQUIP ) 2 MG tablet Take 1 tablet (2 mg total) by mouth at bedtime.   rosuvastatin  (CRESTOR ) 10 MG tablet Take 1 tablet (10 mg total) by mouth daily.   Syringe/Needle, Disp, (SYRINGE 3CC/25GX5/8) 25G X 5/8 3 ML MISC Please use 1 needle and syringe to draw up medication and then replace the needle with a new needle from a new syringe. For B12 injections to be done at home.   traZODone  (DESYREL ) 50 MG tablet Take 0.5-1 tablets (25-50 mg total) by mouth at bedtime.   triamcinolone  (NASACORT ) 55 MCG/ACT AERO nasal inhaler Place 2 sprays into the nose daily.   valsartan  (DIOVAN ) 80 MG tablet Take 1 tablet (80 mg total) by mouth daily.   Vitamin D , Ergocalciferol , (DRISDOL ) 1.25 MG (50000 UNIT) CAPS capsule Take 1 capsule (50,000 Units total) by mouth every 7 (seven) days. (Patient not taking: Reported on 05/15/2024)   No facility-administered encounter medications on file as of 05/18/2024.    Surgical History: Past Surgical History:  Procedure Laterality Date   ABDOMINAL HYSTERECTOMY     ANKLE ARTHROSCOPY Bilateral    APPENDECTOMY     BLADDER SUSPENSION     BREAST EXCISIONAL BIOPSY Left 1990   neg   BREAST EXCISIONAL BIOPSY Right 1989   neg   BREAST SURGERY     CHOLECYSTECTOMY     COLONOSCOPY N/A 11/04/2015   Procedure: COLONOSCOPY;  Surgeon: Lamar ONEIDA Holmes, MD;  Location: [facility] END[facility]COPY;  Service: Endoscopy;  Laterality: N/A;   COLONOSCOPY WITH PROPOFOL  N/A 08/30/2020   Procedure: COLONOSCOPY WITH PROPOFOL ;  Surgeon: Jinny Carmine, MD;  Location: [facility];  Service: Endoscopy;  Laterality: N/A;   ESOPHAGOGASTRODUODENOSCOPY N/A  11/02/2015   Procedure: ESOPHAGOGASTRODUODENOSCOPY (EGD);  Surgeon: Lamar ONEIDA Holmes, MD;  Location: [facility] ENDOSCOPY;  Service: Endoscopy;  Laterality: N/A;   ESOPHAGOGASTRODUODENOSCOPY (EGD) WITH PROPOFOL  N/A 03/16/2022   Procedure: ESOPHAGOGASTRODUODENOSCOPY (EGD) WITH PROPOFOL  foregin body removal;  Surgeon: Jinny Carmine, MD;  Location: [facility];  Service: Endoscopy;  Laterality: N/A;  Diabetic   GASTROJEJUNOSTOMY N/A 08/25/2021   Procedure: LAPAROSCOPIC REDO OF STOMACH TO INTESTINE;  Surgeon: Stevie, Herlene Righter, MD;  Location: [facility] ORS;  Service: General;  Laterality: N/A;   KNEE ARTHROSCOPY Left    KNEE ARTHROSCOPY Right 2025   september   PARTIAL GASTRECTOMY N/A 08/25/2021   Procedure: PARTIAL GASTRECTOMY;  Surgeon: Stevie Herlene Righter, MD;  Location: [facility];  Service: General;  Laterality: N/A;   POLYPECTOMY  08/30/2020   Procedure: POLYPECTOMY;  Surgeon: Jinny Carmine, MD;  Location: [facility];  Service: Endoscopy;;   REDUCTION MAMMAPLASTY Bilateral 1988   TONSILLECTOMY  TOTAL KNEE ARTHROPLASTY Left 02/15/2023   Procedure: TOTAL KNEE ARTHROPLASTY;  Surgeon: Rubie Kemps, MD;  Location: [facility];  Service: Orthopedics;  Laterality: Left;   UPPER GI ENDOSCOPY  08/25/2021   Procedure: UPPER GI ENDOSCOPY;  Surgeon: Stevie, Herlene Righter, MD;  Location: [facility];  Service: General;;    Medical History: Past Medical History:  Diagnosis Date   Allergy    Phreesia 07/23/2020   Anxiety    Phreesia 07/23/2020   COPD (chronic obstructive pulmonary disease) (HCC)    DDD (degenerative disc disease), thoracic    Depression    Phreesia 07/23/2020   Diabetes mellitus without complication (HCC)    type 2   GERD (gastroesophageal reflux disease)    Hyperlipidemia    Nausea and vomiting 03/04/2022   Neuromuscular disorder (HCC)    severe neuropathy feet   Pneumonia    Rapid heart rate    Sleep apnea    no longer uses CPAP due to weight loss   TMJ  (temporomandibular joint disorder) 11/28/2022   Wears contact lenses     Family History: Family History  Problem Relation Age of Onset   Bladder Cancer Mother    High Cholesterol Mother    High blood pressure Mother    Cancer Mother    Dementia Mother    Heart attack Father    High blood pressure Father    Hypertension Other    Kidney cancer Neg Hx    Prostate cancer Neg Hx    Breast cancer Neg Hx     Social History   Socioeconomic History   Marital status: Married    Spouse name: Not on file   Number of children: Not on file   Years of education: Not on file   Highest education level: Not on file  Occupational History   Not on file  Tobacco Use   Smoking status: Former    Current packs/day: 0.00    Types: Cigarettes    Start date: 47    Quit date: 1996    Years since quitting: 30.0    Passive exposure: Past   Smokeless tobacco: Never   Tobacco comments:    quit in 1996  Vaping Use   Vaping status: Never Used  Substance and Sexual Activity   Alcohol use: Not Currently    Alcohol/week: 2.0 standard drinks of alcohol    Types: 1 Glasses of wine, 1 Standard drinks or equivalent per week    Comment: occasional  social   Drug use: No   Sexual activity: Yes    Partners: Male    Birth control/protection: Surgical, Other-see comments  Other Topics Concern   Not on file  Social History Narrative   2 cups of Caffeine  daily    Social Drivers of Health   Tobacco Use: Medium Risk (05/18/2024)   Patient History    Smoking Tobacco Use: Former    Smokeless Tobacco Use: Never    Passive Exposure: Past  Physicist, Medical Strain: Low Risk (02/02/2023)   Overall Financial Resource Strain (CARDIA)    Difficulty of Paying Living Expenses: Not hard at all  Food Insecurity: No Food Insecurity (02/15/2023)   Hunger Vital Sign    Worried About Running Out of Food in the Last Year: Never true    Ran Out of Food in the Last Year: Never true  Transportation Needs: No  Transportation Needs (02/15/2023)   PRAPARE - Transportation    Lack of Transportation (Medical): No    Lack  of Transportation (Non-Medical): No  Physical Activity: Inactive (02/02/2023)   Exercise Vital Sign    Days of Exercise per Week: 0 days    Minutes of Exercise per Session: 0 min  Stress: No Stress Concern Present (02/02/2023)   [employer] of Occupational Health - Occupational Stress Questionnaire    Feeling of Stress : Not at all  Social Connections: Socially Integrated (02/02/2023)   Social Connection and Isolation Panel    Frequency of Communication with Friends and Family: More than three times a week    Frequency of Social Gatherings with Friends and Family: More than three times a week    Attends Religious Services: More than 4 times per year    Active Member of [employer] or Organizations: Yes    Attends Banker Meetings: More than 4 times per year    Marital Status: Married  Catering Manager Violence: Not At Risk (02/15/2023)   Humiliation, Afraid, Rape, and Kick questionnaire    Fear of Current or Ex-Partner: No    Emotionally Abused: No    Physically Abused: No    Sexually Abused: No  Depression (PHQ2-9): High Risk (02/22/2024)   Depression (PHQ2-9)    PHQ-2 Score: 17  Alcohol Screen: Low Risk (02/02/2023)   Alcohol Screen    Last Alcohol Screening Score (AUDIT): 2  Housing: Unknown (11/18/2023)   [facility]   Epic    Unable to Pay for Housing in the Last Year: Not on file    Number of Times Moved in the Last Year: Not on file    At any time in the past 12 months, were you homeless or living in a shelter (including now)?: No  Utilities: Not At Risk (02/15/2023)   [facility] Utilities    Threatened with loss of utilities: No  Health Literacy: Adequate Health Literacy (02/02/2023)   B1300 Health Literacy    Frequency of need for help with medical instructions: Never      Review of Systems  Constitutional:  Positive for  fatigue. Negative for appetite change, chills and fever.  HENT:  Positive for congestion, ear pain (right ear), postnasal drip, rhinorrhea, sinus pressure, sinus pain and sore throat.   Respiratory:  Positive for cough. Negative for chest tightness, shortness of breath and wheezing.   Cardiovascular:  Negative for chest pain and palpitations.  Gastrointestinal:  Negative for diarrhea, nausea and vomiting.  Musculoskeletal:  Negative for myalgias.  Neurological:  Positive for headaches. Negative for dizziness.    Vital Signs: Resp 16   Ht 5' 4 (1.626 m)   Wt 201 lb (91.2 kg)   BMI 34.50 kg/m    Observation/Objective: She is alert and oriented. No acute distress noted.     Assessment/Plan: 1. Acute non-recurrent maxillary sinusitis (Primary) Zpak prescribed, take until gone.  - azithromycin  (ZITHROMAX ) 250 MG tablet; Take 2 tablets on day 1, then 1 tablet daily on days 2 through 5  Dispense: 6 tablet; Refill: 0 - promethazine -dextromethorphan (PROMETHAZINE -DM) 6.25-15 MG/5ML syrup; Take 5 mLs by mouth 4 (four) times daily as needed for cough.  Dispense: 180 mL; Refill: 0  2. Acute cough Cough syrup prescribed for symptom management.  - promethazine -dextromethorphan (PROMETHAZINE -DM) 6.25-15 MG/5ML syrup; Take 5 mLs by mouth 4 (four) times daily as needed for cough.  Dispense: 180 mL; Refill: 0   General Counseling: Wendy Hubbard verbalizes understanding of the findings of today's phone visit and agrees with plan of treatment. I have discussed any further diagnostic evaluation  that may be needed or ordered today. We also reviewed her medications today. she has been encouraged to call the office with any questions or concerns that should arise related to todays visit.  Return if symptoms worsen or fail to improve.   No orders of the defined types were placed in this encounter.   Meds ordered this encounter  Medications   azithromycin  (ZITHROMAX ) 250 MG tablet    Sig: Take 2 tablets  on day 1, then 1 tablet daily on days 2 through 5    Dispense:  6 tablet    Refill:  0   promethazine -dextromethorphan (PROMETHAZINE -DM) 6.25-15 MG/5ML syrup    Sig: Take 5 mLs by mouth 4 (four) times daily as needed for cough.    Dispense:  180 mL    Refill:  0    Time spent:20 Minutes Time spent with patient included reviewing progress notes, labs, imaging studies, and discussing plan for follow up.  [place] Controlled Substance Database was reviewed by me for overdose risk score (ORS) if appropriate.  This patient was seen by Mardy Maxin, FNP-C in collaboration with Dr. Sigrid Bathe as a part of collaborative care agreement.  Kamron Portee R. Maxin, MSN, FNP-C Internal medicine

## 2024-05-23 ENCOUNTER — Telehealth: Payer: Self-pay

## 2024-05-23 ENCOUNTER — Other Ambulatory Visit

## 2024-05-23 ENCOUNTER — Inpatient Hospital Stay: Admission: RE | Admit: 2024-05-23 | Source: Ambulatory Visit

## 2024-05-23 NOTE — Telephone Encounter (Signed)
 Left message for patient to give office a call back.

## 2024-05-29 ENCOUNTER — Ambulatory Visit: Payer: Self-pay | Admitting: Nurse Practitioner

## 2024-05-29 DIAGNOSIS — M48062 Spinal stenosis, lumbar region with neurogenic claudication: Secondary | ICD-10-CM

## 2024-05-29 DIAGNOSIS — M47817 Spondylosis without myelopathy or radiculopathy, lumbosacral region: Secondary | ICD-10-CM

## 2024-05-29 NOTE — Progress Notes (Signed)
 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 I think it would be a good idea to be evaluated by neurosurgery further to determine what the best course of action would be to help her back pain.

## 2024-05-31 ENCOUNTER — Telehealth: Payer: Self-pay | Admitting: Nurse Practitioner

## 2024-05-31 NOTE — Telephone Encounter (Signed)
 Received call from [facility] [employer] w/ [employer] inquiring if patient's supervisor was out of the picture, is patent able to return to work. S/w Alyssa, lvm for Megan, that patient is able to return to work as [employer] as she is not working with that particular supervisor-Toni

## 2024-06-02 ENCOUNTER — Ambulatory Visit: Admitting: Nurse Practitioner

## 2024-06-02 ENCOUNTER — Telehealth: Payer: Self-pay | Admitting: Nurse Practitioner

## 2024-06-02 NOTE — Telephone Encounter (Signed)
 Patient dropped off disability paperwork. Given to Alyssa

## 2024-06-06 ENCOUNTER — Other Ambulatory Visit: Payer: Self-pay | Admitting: Nurse Practitioner

## 2024-06-06 DIAGNOSIS — E559 Vitamin D deficiency, unspecified: Secondary | ICD-10-CM

## 2024-06-06 DIAGNOSIS — F411 Generalized anxiety disorder: Secondary | ICD-10-CM

## 2024-06-06 DIAGNOSIS — F331 Major depressive disorder, recurrent, moderate: Secondary | ICD-10-CM

## 2024-06-06 DIAGNOSIS — M4316 Spondylolisthesis, lumbar region: Secondary | ICD-10-CM

## 2024-06-07 ENCOUNTER — Telehealth: Payer: Self-pay | Admitting: Nurse Practitioner

## 2024-06-07 NOTE — Telephone Encounter (Signed)
 Employer Accommodation form completed & faxed back to HR department; [phone]. Notified patient. Scanned-Toni

## 2024-06-12 NOTE — Progress Notes (Signed)
 Referral sent to [employer] neurosurgery

## 2024-07-20 ENCOUNTER — Encounter

## 2024-07-20 ENCOUNTER — Ambulatory Visit (HOSPITAL_COMMUNITY)

## 2024-07-26 ENCOUNTER — Ambulatory Visit: Admitting: Nurse Practitioner

## 2025-02-22 ENCOUNTER — Ambulatory Visit: Admitting: Nurse Practitioner
